# Patient Record
Sex: Female | Born: 1980 | Hispanic: No | Marital: Single | Smoking: Former smoker
Health system: Southern US, Community
[De-identification: ages and names within clinical notes are randomized; demographics above are authoritative.]

## PROBLEM LIST (undated history)

## (undated) DIAGNOSIS — F319 Bipolar disorder, unspecified: Secondary | ICD-10-CM

## (undated) DIAGNOSIS — B009 Herpesviral infection, unspecified: Secondary | ICD-10-CM

## (undated) DIAGNOSIS — F4310 Post-traumatic stress disorder, unspecified: Secondary | ICD-10-CM

## (undated) DIAGNOSIS — K8020 Calculus of gallbladder without cholecystitis without obstruction: Secondary | ICD-10-CM

## (undated) DIAGNOSIS — E282 Polycystic ovarian syndrome: Secondary | ICD-10-CM

## (undated) DIAGNOSIS — I639 Cerebral infarction, unspecified: Secondary | ICD-10-CM

## (undated) DIAGNOSIS — F209 Schizophrenia, unspecified: Secondary | ICD-10-CM

## (undated) HISTORY — PX: NO PAST SURGERIES: SHX2092

## (undated) HISTORY — DX: Herpesviral infection, unspecified: B00.9

## (undated) HISTORY — DX: Calculus of gallbladder without cholecystitis without obstruction: K80.20

---
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
## 2014-10-11 ENCOUNTER — Encounter (HOSPITAL_COMMUNITY): Payer: Self-pay | Admitting: Neurology

## 2014-10-11 ENCOUNTER — Emergency Department (HOSPITAL_COMMUNITY): Payer: Self-pay

## 2014-10-11 ENCOUNTER — Emergency Department (HOSPITAL_COMMUNITY)
Admission: EM | Admit: 2014-10-11 | Discharge: 2014-10-11 | Disposition: A | Discharge status: Home / Self Care | Payer: Self-pay | Attending: Emergency Medicine | Admitting: Emergency Medicine

## 2014-10-11 DIAGNOSIS — R079 Chest pain, unspecified: Secondary | ICD-10-CM | POA: Insufficient documentation

## 2014-10-11 DIAGNOSIS — Z720 Tobacco use: Secondary | ICD-10-CM | POA: Insufficient documentation

## 2014-10-11 DIAGNOSIS — Z3202 Encounter for pregnancy test, result negative: Secondary | ICD-10-CM | POA: Insufficient documentation

## 2014-10-11 DIAGNOSIS — R112 Nausea with vomiting, unspecified: Secondary | ICD-10-CM | POA: Insufficient documentation

## 2014-10-11 DIAGNOSIS — R0602 Shortness of breath: Secondary | ICD-10-CM | POA: Insufficient documentation

## 2014-10-11 DIAGNOSIS — Z8659 Personal history of other mental and behavioral disorders: Secondary | ICD-10-CM | POA: Insufficient documentation

## 2014-10-11 DIAGNOSIS — R42 Dizziness and giddiness: Secondary | ICD-10-CM | POA: Insufficient documentation

## 2014-10-11 DIAGNOSIS — R55 Syncope and collapse: Secondary | ICD-10-CM | POA: Insufficient documentation

## 2014-10-11 HISTORY — DX: Schizophrenia, unspecified: F20.9

## 2014-10-11 HISTORY — DX: Bipolar disorder, unspecified: F31.9

## 2014-10-11 HISTORY — DX: Polycystic ovarian syndrome: E28.2

## 2014-10-11 LAB — I-STAT BETA HCG BLOOD, ED (MC, WL, AP ONLY): I-stat hCG, quantitative: <5 m[IU]/mL (ref <5)

## 2014-10-11 LAB — BASIC METABOLIC PANEL
Anion gap: 9 (ref 5–15)
BUN: 8 mg/dL (ref 6–20)
CO2: 26 mmol/L (ref 22–32)
Calcium: 9.1 mg/dL (ref 8.9–10.3)
Chloride: 101 mmol/L (ref 101–111)
Creatinine, Ser: 1.01 mg/dL — ABNORMAL HIGH (ref 0.44–1.00)
GFR calc Af Amer: >60 mL/min (ref >60)
GFR calc non Af Amer: >60 mL/min (ref >60)
Glucose, Bld: 103 mg/dL — ABNORMAL HIGH (ref 65–99)
Potassium: 3.7 mmol/L (ref 3.5–5.1)
Sodium: 136 mmol/L (ref 135–145)

## 2014-10-11 LAB — D-DIMER, QUANTITATIVE: D-Dimer, Quant: 0.3 ug/mL-FEU (ref 0.00–0.48)

## 2014-10-11 LAB — I-STAT TROPONIN, ED: Troponin i, poc: 0 ng/mL (ref 0.00–0.08)

## 2014-10-11 LAB — CBC
HCT: 43 % (ref 36.0–46.0)
Hemoglobin: 13.9 g/dL (ref 12.0–15.0)
MCH: 28.8 pg (ref 26.0–34.0)
MCHC: 32.3 g/dL (ref 30.0–36.0)
MCV: 89 fL (ref 78.0–100.0)
Platelets: 279 10*3/uL (ref 150–400)
RBC: 4.83 MIL/uL (ref 3.87–5.11)
RDW: 13.2 % (ref 11.5–15.5)
WBC: 5.3 10*3/uL (ref 4.0–10.5)

## 2014-10-11 NOTE — ED Notes (Signed)
Patient transported to X-ray 

## 2014-10-11 NOTE — Discharge Instructions (Signed)
Primary Amenorrhea Primary amenorrhea is the absence of any menstrual flow in a female by the age of 15 years. An average age for the start of menstruation is the age of 12 years. Primary amenorrhea is not considered to have occurred until a female is older than 15 years and has never menstruated. This may occur with or without other signs of puberty. CAUSES  Some common causes of not menstruating include:  Chromosomal abnormality causing the ovaries to malfunction is the most common cause of primary amenorrhea.  Malnutrition.  Low blood sugar (hypoglycemia).  Polycystic ovary syndrome (cysts in the ovaries, not ovulating).  Absence of the vagina, uterus, or ovaries since birth (congenital).  Extreme obesity.  Cystic fibrosis.  Drastic weight loss from any cause.  Over-exercising (running, biking) causing loss of body fat.  Pituitary gland tumor in the brain.  Long-term (chronic) illnesses.  Cushing disease.  Thyroid disease (hypothyroidism, hyperthyroidism).  Part of the brain (hypothalamus) not functioning normally.  Premature ovarian failure. SYMPTOMS  No menstruation by age 89 years in normally developed females is the primary symptom. Other symptoms may include:  Discharge from the breasts.  Hot flashes.  Adult acne.  Facial or chest hair.  Headaches.  Impaired vision.  Recent stress.  Changes in weight, diet, or exercise patterns. DIAGNOSIS  Primary amenorrhea is diagnosed with the help of a medical history and a physical exam. Other tests that may be recommended include:  Blood tests to check for pregnancy, hormonal changes, a bleeding or thyroid disorder, low iron levels (anemia), or other problems.  Urine tests.  Specialized X-ray exams. TREATMENT  Treatment will depend on the cause. For example, some of the causes of primary amenorrhea, such as congenital absence of sex organs, will require surgery to correct. Others may respond to treatment  with medicine. SEEK MEDICAL CARE IF:  There has not been any menstrual flow by age 84 years.  Body maturation does not occur at a level typical of peers.  Pelvic area pain occurs.  There is unusual weight gain or hair growth.   This information is not intended to replace advice given to you by your health care provider. Make sure you discuss any questions you have with your health care provider.   Document Released: 12/23/2004 Document Revised: 01/13/2014 Document Reviewed: 08/04/2012 Elsevier Interactive Patient Education 2016 [employer] Nonspecific Chest Pain  Chest pain can be caused by many different conditions. There is always a chance that your pain could be related to something serious, such as a heart attack or a blood clot in your lungs. Chest pain can also be caused by conditions that are not life-threatening. If you have chest pain, it is very important to follow up with your health care provider. CAUSES  Chest pain can be caused by:  Heartburn.  Pneumonia or bronchitis.  Anxiety or stress.  Inflammation around your heart (pericarditis) or lung (pleuritis or pleurisy).  A blood clot in your lung.  A collapsed lung (pneumothorax). It can develop suddenly on its own (spontaneous pneumothorax) or from trauma to the chest.  Shingles infection (varicella-zoster virus).  Heart attack.  Damage to the bones, muscles, and cartilage that make up your chest wall. This can include:  Bruised bones due to injury.  Strained muscles or cartilage due to frequent or repeated coughing or overwork.  Fracture to one or more ribs.  Sore cartilage due to inflammation (costochondritis). RISK FACTORS  Risk factors for chest pain may include:  Activities that increase your  risk for trauma or injury to your chest.  Respiratory infections or conditions that cause frequent coughing.  Medical conditions or overeating that can cause heartburn.  Heart disease or family history of  heart disease.  Conditions or health behaviors that increase your risk of developing a blood clot.  Having had chicken pox (varicella zoster). SIGNS AND SYMPTOMS Chest pain can feel like:  Burning or tingling on the surface of your chest or deep in your chest.  Crushing, pressure, aching, or squeezing pain.  Dull or sharp pain that is worse when you move, cough, or take a deep breath.  Pain that is also felt in your back, neck, shoulder, or arm, or pain that spreads to any of these areas. Your chest pain may come and go, or it may stay constant. DIAGNOSIS Lab tests or other studies may be needed to find the cause of your pain. Your health care provider may have you take a test called an ambulatory ECG (electrocardiogram). An ECG records your heartbeat patterns at the time the test is performed. You may also have other tests, such as:  Transthoracic echocardiogram (TTE). During echocardiography, sound waves are used to create a picture of all of the heart structures and to look at how blood flows through your heart.  Transesophageal echocardiogram (TEE).This is a more advanced imaging test that obtains images from inside your body. It allows your health care provider to see your heart in finer detail.  Cardiac monitoring. This allows your health care provider to monitor your heart rate and rhythm in real time.  Holter monitor. This is a portable device that records your heartbeat and can help to diagnose abnormal heartbeats. It allows your health care provider to track your heart activity for several days, if needed.  Stress tests. These can be done through exercise or by taking medicine that makes your heart beat more quickly.  Blood tests.  Imaging tests. TREATMENT  Your treatment depends on what is causing your chest pain. Treatment may include:  Medicines. These may include:  Acid blockers for heartburn.  Anti-inflammatory medicine.  Pain medicine for inflammatory  conditions.  Antibiotic medicine, if an infection is present.  Medicines to dissolve blood clots.  Medicines to treat coronary artery disease.  Supportive care for conditions that do not require medicines. This may include:  Resting.  Applying heat or cold packs to injured areas.  Limiting activities until pain decreases. HOME CARE INSTRUCTIONS  If you were prescribed an antibiotic medicine, finish it all even if you start to feel better.  Avoid any activities that bring on chest pain.  Do not use any tobacco products, including cigarettes, chewing tobacco, or electronic cigarettes. If you need help quitting, ask your health care provider.  Do not drink alcohol.  Take medicines only as directed by your health care provider.  Keep all follow-up visits as directed by your health care provider. This is important. This includes any further testing if your chest pain does not go away.  If heartburn is the cause for your chest pain, you may be told to keep your head raised (elevated) while sleeping. This reduces the chance that acid will go from your stomach into your esophagus.  Make lifestyle changes as directed by your health care provider. These may include:  Getting regular exercise. Ask your health care provider to suggest some activities that are safe for you.  Eating a heart-healthy diet. A registered dietitian can help you to learn healthy eating options.  Maintaining  a healthy weight.  Managing diabetes, if necessary.  Reducing stress. SEEK MEDICAL CARE IF:  Your chest pain does not go away after treatment.  You have a rash with blisters on your chest.  You have a fever. SEEK IMMEDIATE MEDICAL CARE IF:   Your chest pain is worse.  You have an increasing cough, or you cough up blood.  You have severe abdominal pain.  You have severe weakness.  You faint.  You have chills.  You have sudden, unexplained chest discomfort.  You have sudden, unexplained  discomfort in your arms, back, neck, or jaw.  You have shortness of breath at any time.  You suddenly start to sweat, or your skin gets clammy.  You feel nauseous or you vomit.  You suddenly feel light-headed or dizzy.  Your heart begins to beat quickly, or it feels like it is skipping beats. These symptoms may represent a serious problem that is an emergency. Do not wait to see if the symptoms will go away. Get medical help right away. Call your local emergency services (911 in the [place] Do not drive yourself to the hospital.   This information is not intended to replace advice given to you by your health care provider. Make sure you discuss any questions you have with your health care provider.   Document Released: 10/02/2004 Document Revised: 01/13/2014 Document Reviewed: 07/29/2013 Elsevier Interactive Patient Education [employer]
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
## 2014-10-11 NOTE — ED Notes (Addendum)
Pt reports for several months has been having cp and syncope, also irregular periods. Today she came because her skin color is getting lighter than normal. Reports feeling tired and weak all the time. Denies cp at current, but has h/a to back of head. Pt reports polycystic ovarian disease and has been having LLQ abd pain. Also has gained 30 lbs in 2 months

## 2015-04-27 ENCOUNTER — Encounter (HOSPITAL_COMMUNITY): Payer: Self-pay | Admitting: *Deleted

## 2015-04-27 ENCOUNTER — Inpatient Hospital Stay (HOSPITAL_COMMUNITY)
Admission: AD | Admit: 2015-04-27 | Discharge: 2015-05-02 | DRG: 885 | Disposition: A | Discharge status: Home / Self Care | Payer: No Typology Code available for payment source | Source: Intra-hospital | Attending: Psychiatry | Admitting: Psychiatry

## 2015-04-27 ENCOUNTER — Encounter (HOSPITAL_COMMUNITY): Payer: Self-pay

## 2015-04-27 ENCOUNTER — Emergency Department (HOSPITAL_COMMUNITY)
Admission: EM | Admit: 2015-04-27 | Discharge: 2015-04-27 | Disposition: A | Discharge status: Other Acute Inpatient Hospital | Payer: Self-pay | Attending: Emergency Medicine | Admitting: Emergency Medicine

## 2015-04-27 DIAGNOSIS — Z9119 Patient's noncompliance with other medical treatment and regimen: Secondary | ICD-10-CM | POA: Diagnosis not present

## 2015-04-27 DIAGNOSIS — R440 Auditory hallucinations: Secondary | ICD-10-CM

## 2015-04-27 DIAGNOSIS — F17210 Nicotine dependence, cigarettes, uncomplicated: Secondary | ICD-10-CM | POA: Insufficient documentation

## 2015-04-27 DIAGNOSIS — Z62810 Personal history of physical and sexual abuse in childhood: Secondary | ICD-10-CM | POA: Diagnosis present

## 2015-04-27 DIAGNOSIS — F250 Schizoaffective disorder, bipolar type: Secondary | ICD-10-CM | POA: Diagnosis present

## 2015-04-27 DIAGNOSIS — F1210 Cannabis abuse, uncomplicated: Secondary | ICD-10-CM | POA: Insufficient documentation

## 2015-04-27 DIAGNOSIS — G4700 Insomnia, unspecified: Secondary | ICD-10-CM | POA: Diagnosis present

## 2015-04-27 DIAGNOSIS — Z3202 Encounter for pregnancy test, result negative: Secondary | ICD-10-CM | POA: Insufficient documentation

## 2015-04-27 DIAGNOSIS — Z818 Family history of other mental and behavioral disorders: Secondary | ICD-10-CM | POA: Diagnosis not present

## 2015-04-27 DIAGNOSIS — F4310 Post-traumatic stress disorder, unspecified: Secondary | ICD-10-CM | POA: Diagnosis present

## 2015-04-27 DIAGNOSIS — Z915 Personal history of self-harm: Secondary | ICD-10-CM | POA: Diagnosis not present

## 2015-04-27 DIAGNOSIS — R45851 Suicidal ideations: Secondary | ICD-10-CM | POA: Diagnosis present

## 2015-04-27 DIAGNOSIS — F419 Anxiety disorder, unspecified: Secondary | ICD-10-CM | POA: Diagnosis present

## 2015-04-27 DIAGNOSIS — Z8639 Personal history of other endocrine, nutritional and metabolic disease: Secondary | ICD-10-CM | POA: Insufficient documentation

## 2015-04-27 LAB — RAPID URINE DRUG SCREEN, HOSP PERFORMED
Amphetamines: NOT DETECTED
Barbiturates: NOT DETECTED
Benzodiazepines: NOT DETECTED
Cocaine: NOT DETECTED
Opiates: NOT DETECTED
Tetrahydrocannabinol: POSITIVE — AB

## 2015-04-27 LAB — ETHANOL: Alcohol, Ethyl (B): <5 mg/dL (ref <5)

## 2015-04-27 LAB — CBC WITH DIFFERENTIAL/PLATELET
Basophils Absolute: 0 10*3/uL (ref 0.0–0.1)
Basophils Relative: 0 %
Eosinophils Absolute: 0 10*3/uL (ref 0.0–0.7)
Eosinophils Relative: 0 %
HCT: 44.2 % (ref 36.0–46.0)
Hemoglobin: 14.7 g/dL (ref 12.0–15.0)
Lymphocytes Relative: 24 %
Lymphs Abs: 2.1 10*3/uL (ref 0.7–4.0)
MCH: 28.8 pg (ref 26.0–34.0)
MCHC: 33.3 g/dL (ref 30.0–36.0)
MCV: 86.7 fL (ref 78.0–100.0)
Monocytes Absolute: 0.7 10*3/uL (ref 0.1–1.0)
Monocytes Relative: 8 %
Neutro Abs: 5.9 10*3/uL (ref 1.7–7.7)
Neutrophils Relative %: 68 %
Platelets: 350 10*3/uL (ref 150–400)
RBC: 5.1 MIL/uL (ref 3.87–5.11)
RDW: 14.3 % (ref 11.5–15.5)
WBC: 8.7 10*3/uL (ref 4.0–10.5)

## 2015-04-27 LAB — COMPREHENSIVE METABOLIC PANEL
ALT: 35 U/L (ref 14–54)
AST: 20 U/L (ref 15–41)
Albumin: 4.4 g/dL (ref 3.5–5.0)
Alkaline Phosphatase: 84 U/L (ref 38–126)
Anion gap: 7 (ref 5–15)
BUN: 11 mg/dL (ref 6–20)
CO2: 22 mmol/L (ref 22–32)
Calcium: 9.3 mg/dL (ref 8.9–10.3)
Chloride: 108 mmol/L (ref 101–111)
Creatinine, Ser: 0.86 mg/dL (ref 0.44–1.00)
GFR calc Af Amer: >60 mL/min (ref >60)
GFR calc non Af Amer: >60 mL/min (ref >60)
Glucose, Bld: 99 mg/dL (ref 65–99)
Potassium: 3.6 mmol/L (ref 3.5–5.1)
Sodium: 137 mmol/L (ref 135–145)
Total Bilirubin: 1.1 mg/dL (ref 0.3–1.2)
Total Protein: 7.9 g/dL (ref 6.5–8.1)

## 2015-04-27 LAB — POC URINE PREG, ED: Preg Test, Ur: NEGATIVE

## 2015-04-27 MED ORDER — ARIPIPRAZOLE 5 MG PO TABS
5.0000 mg | ORAL_TABLET | Freq: Every day | ORAL | Status: DC
  Administered 2015-04-28 – 2015-04-29 (×2): 5 mg via ORAL
  Filled 2015-04-27 (×3): qty 1

## 2015-04-27 MED ORDER — ACETAMINOPHEN 325 MG PO TABS: 650.0000 mg | ORAL_TABLET | Freq: Four times a day (QID) | ORAL | Status: DC | PRN

## 2015-04-27 MED ORDER — MAGNESIUM HYDROXIDE 400 MG/5ML PO SUSP
30.0000 mL | Freq: Every day | ORAL | Status: DC | PRN
  Administered 2015-04-28: 30 mL via ORAL
  Filled 2015-04-27: qty 30

## 2015-04-27 MED ORDER — ALUM & MAG HYDROXIDE-SIMETH 200-200-20 MG/5ML PO SUSP
30.0000 mL | ORAL | Status: DC | PRN
  Administered 2015-04-28: 30 mL via ORAL
  Filled 2015-04-27: qty 30

## 2015-04-27 MED ORDER — ARIPIPRAZOLE 5 MG PO TABS
5.0000 mg | ORAL_TABLET | Freq: Every day | ORAL | Status: DC
Start: 2015-04-27 — End: 2015-04-27
  Administered 2015-04-27: 5 mg via ORAL
  Filled 2015-04-27: qty 1
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
## 2015-04-27 NOTE — ED Notes (Signed)
Patient's family has her belongings. 
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
## 2015-04-27 NOTE — ED Notes (Signed)
[facility], patient denies needs at this time Pt leaving with Juel [place]
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
## 2015-04-27 NOTE — ED Notes (Signed)
Bed: WBH42 Expected date:  Expected time:  Means of arrival:  Comments: Triage 4 

## 2015-04-27 NOTE — ED Notes (Signed)
Pt received asleep in room. Patient denies SI, HI, A/ V H, depression, anxiety and pain.  No complaints, stable, in no acute distress. Q15 minute rounds and monitoring via [employer] to continue.

## 2015-04-27 NOTE — ED Notes (Signed)
Pt admitted to room #42. Behavior calm, flat affect. Pt reports she is at the hospital d/t "feeling lethargic" Pt reports she has not been able to sleep. Pt denies SI/HI. Pt endorses auditory and visual hallucinations. Pt reports the voices "tell me things ahead of time." Pt reports visual hallucinations of seeing shadows. Pt reports a dx of bipolar and schizophrenia. Reports she was prescribed medication, but has been off her medication for 1 year d/t unable to afford them. Pt denies legal issues and denies hx of aggression. Pt reports she is from [place] and recently moved to come back home with family. Pt denies drug use and reports that she drinks socially. Special checks q 15 mins in place for safety. Video monitoring in place. Will continue to monitor.

## 2015-04-27 NOTE — ED Notes (Signed)
Pt has been changed into scrubs and wanded by security.  Pt's mother took all belongings.

## 2015-04-27 NOTE — Progress Notes (Signed)
[facility] and transfer at 9:00pm.  Call report to [phone].     Maryelizabeth Rowanressa Svea Pusch, MSW, Clare CharonLCSW, LCAS [facility] Triage Specialist [phone]

## 2015-04-27 NOTE — ED Provider Notes (Signed)
CSN: [id number]     Arrival date & time 04/27/15  [id number] History   First MD Initiated Contact with Patient 04/27/15 1009     Chief Complaint  Patient presents with  . Psychiatric Evaluation  . Weakness   Level 5 caveat psychiatric complaints  (Consider location/radiation/quality/duration/timing/severity/associated sxs/prior Treatment) HPI Patient arrived here from [place] within the past 24 hours. She complains of feeling "lethargic" for the past several days. Her mother reports that she "looks right through me like him not better" patient admits to auditory hallucinations last time 3 days ago. She hears voices talking to her like she is in the third person however no command hallucinations and she vehemently denies that she wants to harm herself or anyone else. When I asked mother if she's concerned about patient harm herself her mother reported "I'm concerned about my daughter's well-being" no other associated symptoms. Patient has not taken any Abilify in one year. Past Medical History  Diagnosis Date  . Polycystic ovary disease   . Bipolar 1 disorder (HCC)   . Schizophrenia (HCC)    History reviewed. No pertinent past surgical history. History reviewed. No pertinent family history. Social History  Substance Use Topics  . Smoking status: Current Every Day Smoker    Types: Cigarettes  . Smokeless tobacco: None  . Alcohol Use: Yes     Comment: occ   OB History    No data available     Review of Systems  Unable to perform ROS: Psychiatric disorder  Psychiatric/Behavioral: Positive for hallucinations. Negative for sleep disturbance.      Allergies  Review of patient's allergies indicates no known allergies.  Home Medications   Prior to Admission medications   Not on File   BP 119/85 mmHg  Pulse 82  Temp(Src) 98.3 F (36.8 C) (Oral)  Resp 16  SpO2 100%  LMP 04/17/2015 Physical Exam  Constitutional: She is oriented to person, place, and time. She appears  well-developed and well-nourished. No distress.  Pleasant and cooperative  HENT:  Head: Normocephalic and atraumatic.  Eyes: Conjunctivae are normal. Pupils are equal, round, and reactive to light.  Neck: Neck supple. No tracheal deviation present. No thyromegaly present.  Cardiovascular: Normal rate and regular rhythm.   No murmur heard. Pulmonary/Chest: Effort normal and breath sounds normal.  Abdominal: Soft. Bowel sounds are normal. She exhibits no distension. There is no tenderness.  Musculoskeletal: Normal range of motion. She exhibits no edema or tenderness.  Neurological: She is alert and oriented to person, place, and time. Coordination normal.  Gait normal not lightheaded on standing  Skin: Skin is warm and dry. No rash noted.  Psychiatric: She has a normal mood and affect.  Nursing note and vitals reviewed.   ED Course  Procedures (including critical care time) Labs Review Labs Reviewed - No data to display  Imaging Review No results found. I have personally reviewed and evaluated these images and lab results as part of my medical decision-making.   EKG Interpretation None     Results for orders placed or performed during the hospital encounter of 04/27/15  Comprehensive metabolic panel  Result Value Ref Range   Sodium 137 135 - 145 mmol/L   Potassium 3.6 3.5 - 5.1 mmol/L   Chloride 108 101 - 111 mmol/L   CO2 22 22 - 32 mmol/L   Glucose, Bld 99 65 - 99 mg/dL   BUN 11 6 - 20 mg/dL   Creatinine, Ser 4.010.86 0.44 - 1.00 mg/dL   Calcium  9.3 8.9 - 10.3 mg/dL   Total Protein 7.9 6.5 - 8.1 g/dL   Albumin 4.4 3.5 - 5.0 g/dL   AST 20 15 - 41 U/L   ALT 35 14 - 54 U/L   Alkaline Phosphatase 84 38 - 126 U/L   Total Bilirubin 1.1 0.3 - 1.2 mg/dL   GFR calc non Af Amer >60 >60 mL/min   GFR calc Af Amer >60 >60 mL/min   Anion gap 7 5 - 15  Ethanol  Result Value Ref Range   Alcohol, Ethyl (B) <5 <5 mg/dL  CBC with Diff  Result Value Ref Range   WBC 8.7 4.0 - 10.5 K/uL    RBC 5.10 3.87 - 5.11 MIL/uL   Hemoglobin 14.7 12.0 - 15.0 g/dL   HCT 16.1 09.6 - 04.5 %   MCV 86.7 78.0 - 100.0 fL   MCH 28.8 26.0 - 34.0 pg   MCHC 33.3 30.0 - 36.0 g/dL   RDW 40.9 81.1 - 91.4 %   Platelets 350 150 - 400 K/uL   Neutrophils Relative % 68 %   Neutro Abs 5.9 1.7 - 7.7 K/uL   Lymphocytes Relative 24 %   Lymphs Abs 2.1 0.7 - 4.0 K/uL   Monocytes Relative 8 %   Monocytes Absolute 0.7 0.1 - 1.0 K/uL   Eosinophils Relative 0 %   Eosinophils Absolute 0.0 0.0 - 0.7 K/uL   Basophils Relative 0 %   Basophils Absolute 0.0 0.0 - 0.1 K/uL  POC urine preg, ED (not at [facility])  Result Value Ref Range   Preg Test, Ur NEGATIVE NEGATIVE   No results found.  MDM  TTS consulted And will arrange for inpatient psychiatric stay Diagnosis #1 auditory hallucinations #2 bipolar disorder #3 schizophrenia #4 medication noncompliance Final diagnoses:  None        Doug Sou, MD 04/27/15 1225

## 2015-04-28 DIAGNOSIS — F250 Schizoaffective disorder, bipolar type: Principal | ICD-10-CM

## 2015-04-28 LAB — LIPID PANEL
Cholesterol: 218 mg/dL — ABNORMAL HIGH (ref 0–200)
HDL: 60 mg/dL (ref >40)
LDL Cholesterol: 140 mg/dL — ABNORMAL HIGH (ref 0–99)
Total CHOL/HDL Ratio: 3.6 RATIO
Triglycerides: 91 mg/dL (ref <150)
VLDL: 18 mg/dL (ref 0–40)

## 2015-04-28 MED ORDER — MIRTAZAPINE 7.5 MG PO TABS
7.5000 mg | ORAL_TABLET | Freq: Every day | ORAL | Status: DC
  Administered 2015-04-28: 7.5 mg via ORAL
  Filled 2015-04-28 (×3): qty 1
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
## 2015-04-28 NOTE — Plan of Care (Signed)
Problem: Alteration in mood Goal: LTG-Patient reports reduction in suicidal thoughts (Patient reports reduction in suicidal thoughts and is able to verbalize a safety plan for whenever patient is feeling suicidal)  Pt. Denies SI this AM. States she will come to staff if this changes.

## 2015-04-28 NOTE — Progress Notes (Signed)
Adult Psychoeducational Group Note  Date:  04/28/2015 Time:  8:53 PM  Group Topic/Focus:  Wrap-Up Group:   The focus of this group is to help patients review their daily goal of treatment and discuss progress on daily workbooks.  Participation Level:  Active  Participation Quality:  Appropriate  Affect:  Appropriate  Cognitive:  Appropriate  Insight: Appropriate  Engagement in Group:  Engaged  Modes of Intervention:  Discussion  Additional Comments: The patient expressed that she had a good day and rates today a 9.The patient also attend groups.  Octavio Mannshigpen, Halaina Vanduzer Lee 04/28/2015, 8:53 PM
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
## 2015-04-28 NOTE — [facility] Group Notes (Signed)
[facility] Group Notes: (Clinical Social Work)   04/28/2015      Type of Therapy:  Group Therapy   Participation Level:  Did Not Attend despite MHT prompting   Abigale Dorow Grossman-Orr, LCSW 04/28/2015, 12:59 PM     

## 2015-04-28 NOTE — Progress Notes (Signed)
D) Pt. Reports sleeping poorly last night. She also reports pain of 2/10 in her mid abdomen. She also states she haas not had a bM for 2 days. Denies SI/HI/AVH.  Took AM medications without difficulty. A) . MOM given and pt. Encouraged to push fluids. Gatorade at bedside.  R) Pain at reassessment is 0/10. Still 0 BM, will be reassessed at close of shift. Continue to maintain safety on the unit.
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
## 2015-04-29 MED ORDER — QUETIAPINE FUMARATE 100 MG PO TABS
100.0000 mg | ORAL_TABLET | Freq: Every day | ORAL | Status: DC
  Administered 2015-04-29 – 2015-04-30 (×2): 100 mg via ORAL
  Filled 2015-04-29 (×4): qty 1

## 2015-04-29 MED ORDER — HYDROXYZINE HCL 25 MG PO TABS
25.0000 mg | ORAL_TABLET | Freq: Four times a day (QID) | ORAL | Status: DC | PRN
  Filled 2015-04-29: qty 10

## 2015-04-29 NOTE — Progress Notes (Signed)
D) Pt. Reports 8/10 for depression, 7/10 for hopelessness and she did not grade anxiety on her self inventory but verbally she reports no SI/HI or AVH. Her goal is "goals". Pt. Is disheveled and in scrubs. Marland Kitchen. A) Continue to maintain safety on the unit. R) Pt. Attending groups and interacting appropriately with staff and peers.

## 2015-04-29 NOTE — Plan of Care (Signed)
Problem: Ineffective individual coping Goal: STG: Patient will remain free from self harm Outcome: Progressing Pt. Has been taking her medications as prescribed. She has denied SI this AM and verbalizes that she will come to staff if that changes.

## 2015-04-29 NOTE — Progress Notes (Signed)
Sarah Evans rates Anxiety 2/10 and Depression 8/10. Her goal for today was to go to group and actively participate. She states she has not been sleeping well on Remeron and has been on Seroquel before and would like to restart that as opposed to continuing to take Remeron. Denies SI/HI/AVH. Encouragement and support given. Medications administered as prescribed. Continue to monitor Q15 minutes for patient safety and medication effectiveness.

## 2015-04-29 NOTE — [facility] Group Notes (Signed)
[facility] Group Notes:  (Clinical Social Work)  04/29/2015  11:00AM-12:00PM  Summary of Progress/Problems:  The main focus of today's process group was to listen to a variety of genres of music and to identify that different types of music provoke different responses.  The patient then was able to identify personally what was soothing for them, as well as energizing, as well as how patient can personally use this knowledge in sleep habits, with depression, and with other symptoms.  The patient expressed at the end of group that she felt happy and different than she did at the beginning of group.  Her affect was less constricted, noticeably so.  Type of Therapy:  Music Therapy   Participation Level:  Active  Participation Quality:  Attentive and Sharing  Affect:  Blunted  Cognitive:  Oriented  Insight:  Engaged  Engagement in Therapy:  Engaged  Modes of Intervention:   Activity, Exploration  Ambrose MantleMareida Grossman-Orr, LCSW 04/29/2015

## 2015-04-29 NOTE — Progress Notes (Signed)
[facility] MD Progress Note  04/29/2015 10:51 AM Sarah Evans  MRN:  [id number] Subjective:  I don't like Remeron.  It is making me restless.  I still have bad thoughts.  I cannot sleep very well.  I like Seroquel.  Subjective; Patient is a 35 year old African-American female who was admitted because of severe depression, having hallucination and paranoia.  She recently breakup with her partner.  Patient has history of right polar disorder and schizophrenia in the past.  She had taken Abilify in the past. Patient seen chart reviewed.  Patient remained very anxious, paranoid and guarded.  She does not want Remeron because she could not sleep with Remeron.  She preferred to go back on Seroquel.  Currently she is taking Abilify she does not feel it is helping as much.  Patient is very isolated, withdrawn and does not participate in the groups.  She still hear voices on and off and remained very depressed.  She continued to endorse thoughts of harming herself.  She has no tremors or shakes. Lab drawn.  Prolactin level and hemoglobin A1c is pending.   Principal Problem: Schizoaffective disorder, bipolar type (HCC) Diagnosis:   Patient Active Problem List   Diagnosis Date Noted  . Schizoaffective disorder, bipolar type (HCC) [F25.0] 04/27/2015   Total Time spent with patient: 30 minutes  Past Psychiatric History: See admission note.  Past Medical History:  Past Medical History  Diagnosis Date  . Polycystic ovary disease   . Bipolar 1 disorder (HCC)   . Schizophrenia (HCC)    History reviewed. No pertinent past surgical history. Family History: History reviewed. No pertinent family history. Family Psychiatric  History: See admission note Social History:  History  Alcohol Use No    Comment: occ     History  Drug Use No    Social History   Social History  . Marital Status: Single    Spouse Name: N/A  . Number of Children: N/A  . Years of Education: N/A   Social History Main Topics  .  Smoking status: Never Smoker   . Smokeless tobacco: None  . Alcohol Use: No     Comment: occ  . Drug Use: No  . Sexual Activity: Yes    Birth Control/ Protection: None   Other Topics Concern  . None   Social History Narrative   Additional Social History:                         Sleep: Fair  Appetite:  Fair  Current Medications: Current Facility-Administered Medications  Medication Dose Route Frequency Provider Last Rate Last Dose  . acetaminophen (TYLENOL) tablet 650 mg  650 mg Oral Q6H PRN Charm Rings, NP      . alum & mag hydroxide-simeth (MAALOX/MYLANTA) 200-200-20 MG/5ML suspension 30 mL  30 mL Oral Q4H PRN Charm Rings, NP   30 mL at 04/28/15 0814  . ARIPiprazole (ABILIFY) tablet 5 mg  5 mg Oral Daily Charm Rings, NP   5 mg at 04/29/15 0759  . magnesium hydroxide (MILK OF MAGNESIA) suspension 30 mL  30 mL Oral Daily PRN Charm Rings, NP   30 mL at 04/28/15 0815  . mirtazapine (REMERON) tablet 7.5 mg  7.5 mg Oral QHS Oneta Rack, NP   7.5 mg at 04/28/15 2149    Lab Results:  Results for orders placed or performed during the hospital encounter of 04/27/15 (from the past 48 hour(s))  Lipid panel     Status: Abnormal   Collection Time: 04/28/15  6:18 PM  Result Value Ref Range   Cholesterol 218 (H) 0 - 200 mg/dL   Triglycerides 91 <829 mg/dL   HDL 60 >56 mg/dL   Total CHOL/HDL Ratio 3.6 RATIO   VLDL 18 0 - 40 mg/dL   LDL Cholesterol 213 (H) 0 - 99 mg/dL    Comment:        Total Cholesterol/HDL:CHD Risk Coronary Heart Disease Risk Table                     Men   Women  1/2 Average Risk   3.4   3.3  Average Risk       5.0   4.4  2 X Average Risk   9.6   7.1  3 X Average Risk  23.4   11.0        Use the calculated Patient Ratio above and the CHD Risk Table to determine the patient's CHD Risk.        ATP III CLASSIFICATION (LDL):  <100     mg/dL   Optimal  086-578  mg/dL   Near or Above                    Optimal  130-159  mg/dL    Borderline  469-629  mg/dL   High  >528     mg/dL   Very High Performed at [facility]     Blood Alcohol level:  Lab Results  Component Value Date   [facility] <5 04/27/2015    Physical Findings: AIMS:  , ,  ,  ,    CIWA:    COWS:     Musculoskeletal: Strength & Muscle Tone: within normal limits Gait & Station: normal Patient leans: N/A  Psychiatric Specialty Exam: Review of Systems  Constitutional: Negative.   Musculoskeletal: Negative.   Skin: Negative for itching and rash.  Neurological: Negative for headaches.  Psychiatric/Behavioral: Positive for depression and hallucinations. The patient is nervous/anxious and has insomnia.     Blood pressure 114/80, pulse 91, temperature 97.3 F (36.3 C), temperature source Oral, resp. rate 20, height  (1.651 m), weight 75.297 kg (166 lb), last menstrual period 04/17/2015.Body mass index is 27.62 kg/(m^2).  General Appearance: Fairly Groomed and Guarded  Patent attorney::  Fair  Speech:  Slow  Volume:  Decreased  Mood:  Anxious, Dysphoric and Irritable  Affect:  Constricted, Depressed and Flat  Thought Process:  Circumstantial  Orientation:  Full (Time, Place, and Person)  Thought Content:  Hallucinations: Auditory, Paranoid Ideation and Rumination  Suicidal Thoughts:  Yes.  without intent/plan  Homicidal Thoughts:  No  Memory:  Immediate;   Fair Recent;   Fair Remote;   Fair  Judgement:  Fair  Insight:  Fair  Psychomotor Activity:  Decreased  Concentration:  Fair  Recall:  [employer] of Knowledge:Good  Language: Fair  Akathisia:  No  Handed:  Right  AIMS (if indicated):     Assets:  Communication Skills Desire for Improvement Housing  ADL's:  Intact  Cognition: WNL  Sleep:  Number of Hours: 6.5   Treatment Plan Summary: Daily contact with patient to assess and evaluate symptoms and progress in treatment and Medication management Discontinue Remeron as patient does not see any improvement and feels more  restless. Discontinue Abilify and we will try Seroquel.  Patient told Seroquel helped her in the past.  Labs reviewed, hemoglobin A1c and productive level pending.  Total cholesterol 218, UDS is positive for cannabis, her CBC and comprehensive metabolic panel is normal. Encouraged to participate in group milieu therapy.  Discussed medication side effects and benefits. Increase collateral information. Start discharge planning and social worker to follow-up on discharge and disposition  Lorelai Huyser T., MD 04/29/2015, 10:51 AM

## 2015-04-29 NOTE — Progress Notes (Signed)
Adult Psychoeducational Group Note  Date:  04/29/2015 Time:  8:57 PM  Group Topic/Focus:  Wrap-Up Group:   The focus of this group is to help patients review their daily goal of treatment and discuss progress on daily workbooks.  Participation Level:  Did Not Attend  Participation Quality:  Did not attend  Affect:  Did not attend  Cognitive:  Did not attend  Insight: None  Engagement in Group:  Did not attend  Modes of Intervention:  Did not attend   Additional Comments:  Patient did not attend wrap up group tonight.   [name] L [name] 04/29/2015, 8:57 PM

## 2015-04-30 DIAGNOSIS — F4310 Post-traumatic stress disorder, unspecified: Secondary | ICD-10-CM

## 2015-04-30 LAB — HEMOGLOBIN A1C
Hgb A1c MFr Bld: 5.6 % (ref 4.8–5.6)
Mean Plasma Glucose: 114 mg/dL

## 2015-04-30 LAB — PROLACTIN: Prolactin: 13 ng/mL (ref 4.8–23.3)

## 2015-04-30 MED ORDER — FLUOXETINE HCL 20 MG PO CAPS
20.0000 mg | ORAL_CAPSULE | Freq: Every day | ORAL | Status: DC
Start: 2015-04-30 — End: 2015-05-02
  Administered 2015-04-30 – 2015-05-02 (×3): 20 mg via ORAL
  Filled 2015-04-30: qty 1
  Filled 2015-04-30: qty 7
  Filled 2015-04-30 (×5): qty 1

## 2015-04-30 NOTE — Plan of Care (Signed)
Problem: Diagnosis: Increased Risk For Suicide Attempt Goal: STG-Patient Will Comply With Medication Regime Outcome: Progressing Pt has been compliant with scheduled medication tonight.       

## 2015-04-30 NOTE — Progress Notes (Signed)
D: Pt denies SI/HI/AVH. Pt is pleasant and cooperative. Pt stated she was feeling more depressed today, but feeling better overall, not feeling as bad as she was when she came in.  A: Pt was offered support and encouragement. Pt was given scheduled medications. Pt was encourage to attend groups. Q 15 minute checks were done for safety.   R:Pt attends groups and interacts well with peers and staff. Pt is taking medication. Pt has no complaints.Pt receptive to treatment and safety maintained on unit.

## 2015-04-30 NOTE — Progress Notes (Signed)
Adult Psychoeducational Group Note  Date:  04/30/2015 Time:  8:54 PM  Group Topic/Focus:  Wrap-Up Group:   The focus of this group is to help patients review their daily goal of treatment and discuss progress on daily workbooks.  Participation Level:  Active  Participation Quality:  Appropriate  Affect:  Appropriate  Cognitive:  Alert  Insight: Appropriate  Engagement in Group:  Engaged  Modes of Intervention:  Discussion  Additional Comments:  Patient states "On a scale from 1-10, my day was a 6. Patient goal for today was "to talk to my mother".   Shawne Eskelson L Lakaisha Danish 04/30/2015, 8:54 PM

## 2015-04-30 NOTE — Plan of Care (Signed)
Problem: Alteration in mood Goal: LTG-Patient reports reduction in suicidal thoughts (Patient reports reduction in suicidal thoughts and is able to verbalize a safety plan for whenever patient is feeling suicidal)  Outcome: Progressing Pt denies SI at this time     

## 2015-04-30 NOTE — [facility] Group Notes (Signed)
[facility] LCSW Group Therapy  04/30/2015 , 3:34 PM   Type of Therapy:  Group Therapy  Participation Level:  Active  Participation Quality:  Attentive  Affect:  Appropriate  Cognitive:  Alert  Insight:  Improving  Engagement in Therapy:  Engaged  Modes of Intervention:  Discussion, Exploration and Socialization  Summary of Progress/Problems: Today's group focused on the term Diagnosis.  Participants were asked to define the term, and then pronounce whether it is a negative, positive or neutral term.  Stayed the entire time.  Engaged throughout.  Was willing to reveal quite a a bit of information about herself-surprising due to her guarded nature this AM.  Stated she had recently left a 15 year relationship. "After things got rocky, I decided to give it 6 months where I would really try to work on it, so when we broke up, I felt like I had done everything i could to make it work.  No regrets."  Went on to cite support of family as what is helping her now.  "I didn't realize I had so much support.  It's been very nice."  Daryel Geraldorth, Moriah Loughry B 04/30/2015 , 3:34 PM
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
## 2015-04-30 NOTE — Progress Notes (Signed)
D: Pt was in the hallway upon initial approach.  Pt has anxious affect and mood.  She reports her goal was "to try to figure out to try to sign the 72 hour form."  Pt reports she signed a 72 hour AMA discharge request.  She reports she feels ready and safe to discharge.  Pt denies SI/HI, denies hallucinations, denies pain.  Pt has been visible in milieu with minimal peer interaction.  She did not attend evening group.  A: Introduced self to pt.  Met with pt and offered support and encouragement.  Actively listened to pt.  Medication administered per order.   R: Pt is compliant with medication.  Pt verbally contracts for safety.  Will continue to monitor and assess.

## 2015-04-30 NOTE — [facility] Group Notes (Signed)
[facility]   04/30/2015 11:37 AM  Participation Quality:  Minimal  Mood/Affect:  Flat  Depression Rating:    Anxiety Rating:    Thoughts of Suicide:  No Will you contract for safety?   NA  Current AVH:  denies  Plan for Discharge/Comments:  Presents as guarded, vague.  "I'm here because I am moody.  It was my mother's idea for me to come in.  I guess you could ask her."  States she just moved from [place] and has lots of family support here.  Transportation Means:   Supports:  Daryel GeraldNorth, Marline Morace B

## 2015-04-30 NOTE — Progress Notes (Signed)
DAR NOTE: Patient remained isolative to her room.  Mood and affect remained depressed.  Denies pain, auditory and visual hallucinations.  Rates depression at 5, hopelessness at 0, and anxiety at 0.  Maintained on routine safety checks.  Medications given as prescribed.  Support and encouragement offered as needed.  Attended group and participated.   Complain about roommate being intrusive and controlling.  Refused to go for supper after several encouragement.
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
## 2015-05-01 LAB — TSH: TSH: 1.866 u[IU]/mL (ref 0.350–4.500)

## 2015-05-01 MED ORDER — QUETIAPINE FUMARATE 50 MG PO TABS
150.0000 mg | ORAL_TABLET | Freq: Every day | ORAL | Status: DC
Start: 2015-05-01 — End: 2015-05-02
  Administered 2015-05-01: 150 mg via ORAL
  Filled 2015-05-01: qty 3
  Filled 2015-05-01: qty 21
  Filled 2015-05-01: qty 3

## 2015-05-01 NOTE — [facility] Group Notes (Signed)
[facility] Group Notes:  (Nursing/Recovery)  Date:  05/01/2015  Time: 1000 Type of Therapy:  Nurse Education  Participation Level:  Active  Participation Quality:  Appropriate, Attentive, Sharing and Supportive  Affect:  Flat  Cognitive:  Alert, Appropriate and Oriented  Insight:  Good  Engagement in Group:  Engaged and Supportive  Modes of Intervention:  Discussion, Education, Orientation and Support  Summary of Progress/Problems:  Sarah Evans, Sarah Evans 05/01/2015, 1000

## 2015-05-01 NOTE — Progress Notes (Signed)
DAR NOTE: Patient presents with flat affect and depressed mood.  Denies pain, auditory and visual hallucinations.  Rates depression at 5, hopelessness at 0, and anxiety at 0.  Maintained on routine safety checks.  Medications given as prescribed.  Support and encouragement offered as needed.  Attended group and participated.   Patient remained isolative to her room.  Patient preoccupied with discharge plan.

## 2015-05-01 NOTE — Progress Notes (Signed)
Adult Psychoeducational Group Note  Date:  05/01/2015 Time:  9:02 PM  Group Topic/Focus:  Wrap-Up Group:   The focus of this group is to help patients review their daily goal of treatment and discuss progress on daily workbooks.  Participation Level:  Active  Participation Quality:  Appropriate  Affect:  Appropriate  Cognitive:  Alert  Insight: Appropriate  Engagement in Group:  Engaged  Modes of Intervention:  Discussion  Additional Comments:  Patient goal for today was to Participate. Patient states "I met my goal". On a scale between 1-10, (1=worse, 10=best) patient rated her day a 9.5 because "It's been a great day".  Jamileth Putzier L Brigitte Soderberg 05/01/2015, 9:02 PM
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
## 2015-05-01 NOTE — [facility] Group Notes (Signed)
[facility] LCSW Group Therapy  05/01/2015 1:15 pm  Type of Therapy: Process Group Therapy  Participation Level:  Active  Participation Quality:  Appropriate  Affect:  Flat  Cognitive:  Oriented  Insight:  Improving  Engagement in Group:  Limited  Engagement in Therapy:  Limited  Modes of Intervention:  Activity, Clarification, Education, Problem-solving and Support  Summary of Progress/Problems: Today's group addressed the issue of overcoming obstacles.  Patients were asked to identify their biggest obstacle post d/c that stands in the way of their on-going success, and then problem solve as to how to manage this. Stayed the entire time.  Limited engagement, and back to being more vague and guarded.  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  Daryel Geraldorth, Sarah Evans B 05/01/2015   1:45 PM
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
## 2015-05-02 MED ORDER — HYDROXYZINE HCL 25 MG PO TABS: 25.0000 mg | ORAL_TABLET | Freq: Four times a day (QID) | ORAL | Status: DC | PRN

## 2015-05-02 MED ORDER — QUETIAPINE FUMARATE 50 MG PO TABS: 150.0000 mg | ORAL_TABLET | Freq: Every day | ORAL | Status: DC

## 2015-05-02 MED ORDER — FLUOXETINE HCL 20 MG PO CAPS: 20.0000 mg | ORAL_CAPSULE | Freq: Every day | ORAL | Status: DC
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
## 2015-05-02 NOTE — Progress Notes (Signed)
  [facility] Adult [facility] Management Discharge Plan :  Will you be returning to the same living situation after discharge:  Yes,  home At discharge, do you have transportation home?: Yes,  family Do you have the ability to pay for your medications: Yes,  mental health  Release of information consent forms completed and in the chart;  Patient's signature needed at discharge.  Patient to Follow up at: Follow-up Information    [facility] with [facility]   Specialty:  Behavioral Health   Why:  You will need to go to the walk-in clinic tomorrow or Friday at 8AM.  Take your hospital paperwork with you.   Contact information:   [address] [place] [address] [phone]       Next level of care provider has access to [facility] Link:no  Safety Planning and Suicide Prevention discussed: Yes,  yes  Have you used any form of tobacco in the last 30 days? (Cigarettes, Smokeless Tobacco, Cigars, and/or Pipes): No  Has patient been referred to the Quitline?: N/A patient is not a smoker  Patient has been referred for addiction treatment: Yes  Daryel Geraldorth, Sarah Evans B 05/02/2015, 10:55 AM

## 2015-05-02 NOTE — Progress Notes (Signed)
Recreation Therapy Notes  04.26.2017 approximately 8:30am. LRT returned with literature on stress management techniques patient can use post d/c, patient provided information on mindfulness, mindful breathing, diaphragmatic breathing and progressive muscle relaxation. LRT focused on mindful breathing and diaphragmatic breathing with patient. Patient receptive to both techniques and demonstrated she could practice independently. Patient acknowledged understanding of additional techniques and was receptive to literature provided. Patient expressed she is ready to d/c.    Marykay Lexenise L Antinette Keough, LRT/CTRS    Venancio Chenier L 05/02/2015 12:00 PM

## 2015-05-02 NOTE — Progress Notes (Signed)
Patient discharged home with prescriptions and samples. Patient was stable and appreciative at that time. All papers and prescriptions were given and valuables returned. Verbal understanding expressed. Denies SI/HI and A/VH. Patient given opportunity to express concerns and ask questions.  

## 2015-05-02 NOTE — B[facility] Suicide Risk Assessment (Signed)
[facility]   Principal Problem: Schizoaffective disorder, bipolar type [facility](HCC) Discharge Diagnoses:  Patient Active Problem List   Diagnosis Date Noted  . PTSD (post-traumatic stress disorder) [F43.10] 04/30/2015  . Schizoaffective disorder, bipolar type (HCC) [F25.0] 04/27/2015    Total Time spent with patient: 30 minutes  Musculoskeletal: Strength & Muscle Tone: within normal limits Gait & Station: normal Patient leans: N/A  Psychiatric Specialty Exam: Review of Systems  Psychiatric/Behavioral: Negative for depression and hallucinations.  All other systems reviewed and are negative.   Blood pressure 141/90, pulse 112, temperature 97.7 F (36.5 C), temperature source Oral, resp. rate 20, height 5\' 5"  (1.651 m), weight 75.297 kg (166 lb), last menstrual period 04/17/2015.Body mass index is 27.62 kg/(m^2).  General Appearance: Casual  Eye Contact::  Fair  Speech:  Clear and Coherent409  Volume:  Normal  Mood:  Euthymic  Affect:  Congruent  Thought Process:  Coherent  Orientation:  Full (Time, Place, and Person)  Thought Content:  WDL  Suicidal Thoughts:  No  Homicidal Thoughts:  No  Memory:  Immediate;   Fair Recent;   Fair Remote;   Fair  Judgement:  Fair  Insight:  Fair  Psychomotor Activity:  Normal  Concentration:  Fair  Recall:  FiservFair  Fund of Knowledge:Fair  Language: Fair  Akathisia:  No  Handed:  Right  AIMS (if indicated):     Assets:  Desire for Improvement  Sleep:  Number of Hours: 6  Cognition: WNL  ADL's:  Intact   Mental Status Per Nursing Assessment::   On Admission:  NA  Demographic Factors:  Unemployed  Loss Factors: Loss of significant relationship  Historical Factors: Impulsivity  Risk Reduction Factors:   Positive social support  Continued Clinical Symptoms:  Previous Psychiatric Diagnoses and Treatments  Cognitive Features That Contribute To Risk:  None    Suicide Risk:  Minimal: No identifiable  suicidal ideation.  Patients presenting with no risk factors but with morbid ruminations; may be classified as minimal risk based on the severity of the depressive symptoms    Plan Of Care/Follow-up recommendations:  Activity:  no restrictions Diet:  regular Tests:  as needed Other:  follow up with aftercare  Mozella Rexrode, MD 05/02/2015, 9:36 AM

## 2015-05-02 NOTE — Progress Notes (Signed)
D: D: Pt denied depression, anxiety, pain, SI, HI or AVH; she states, "I will be going home tomorrow, I feel I'm ready." Pt is however flat and withdrawn to self. Pt remained calm and cooperative. A: Medications offered as prescribed.  Support, encouragement, and safe environment provided.  15-minute safety checks continue. R: Pt was med compliant.  Pt attended wrap-up group. Safety checks continue

## 2015-05-02 NOTE — BHH Suicide Risk Assessment (Signed)
BHH INPATIENT:  Family/Significant Other Suicide Prevention Education  Suicide Prevention Education:  Education Completed; Lauris PoagLouisa Herbster, mother, [phone] has been identified by the patient as the family member/significant other with whom the patient will be residing, and identified as the person(s) who will aid the patient in the event of a mental health crisis (suicidal ideations/suicide attempt).  With written consent from the patient, the family member/significant other has been provided the following suicide prevention education, prior to the and/or following the discharge of the patient.  The suicide prevention education provided includes the following:  Suicide risk factors  Suicide prevention and interventions  National Suicide Hotline telephone number  [facility] assessment telephone number  [facility] Emergency Assistance 911  [facility] and/or Residential Mobile Crisis Unit telephone number  Request made of family/significant other to:  Remove weapons (e.g., guns, rifles, knives), all items previously/currently identified as safety concern.    Remove drugs/medications (over-the-counter, prescriptions, illicit drugs), all items previously/currently identified as a safety concern.  The family member/significant other verbalizes understanding of the suicide prevention education information provided.  The family member/significant other agrees to remove the items of safety concern listed above.  Daryel Geraldorth, Azion Centrella B 05/02/2015, 10:52 AM

## 2015-10-30 ENCOUNTER — Telehealth: Payer: Self-pay | Admitting: *Deleted

## 2015-10-30 NOTE — Telephone Encounter (Signed)
entered in error   

## 2018-01-14 ENCOUNTER — Ambulatory Visit: Payer: Self-pay | Admitting: Family Medicine

## 2018-02-23 ENCOUNTER — Ambulatory Visit: Payer: Self-pay | Admitting: Family Medicine

## 2018-03-23 ENCOUNTER — Ambulatory Visit (INDEPENDENT_AMBULATORY_CARE_PROVIDER_SITE_OTHER): Payer: Self-pay | Admitting: Primary Care

## 2018-04-19 ENCOUNTER — Encounter: Payer: Self-pay | Admitting: Nurse Practitioner

## 2018-04-19 ENCOUNTER — Ambulatory Visit: Payer: Self-pay | Admitting: Nurse Practitioner

## 2018-04-26 ENCOUNTER — Ambulatory Visit: Payer: Self-pay

## 2018-04-28 DIAGNOSIS — F3132 Bipolar disorder, current episode depressed, moderate: Secondary | ICD-10-CM | POA: Insufficient documentation

## 2018-04-28 DIAGNOSIS — Z9152 Personal history of nonsuicidal self-harm: Secondary | ICD-10-CM | POA: Insufficient documentation

## 2018-05-10 ENCOUNTER — Other Ambulatory Visit (HOSPITAL_COMMUNITY)
Admission: RE | Admit: 2018-05-10 | Discharge: 2018-05-10 | Disposition: A | Discharge status: Home / Self Care | Payer: Medicaid Other | Source: Ambulatory Visit | Attending: Family Medicine | Admitting: Family Medicine

## 2018-05-10 ENCOUNTER — Other Ambulatory Visit: Payer: Self-pay

## 2018-05-10 ENCOUNTER — Ambulatory Visit (INDEPENDENT_AMBULATORY_CARE_PROVIDER_SITE_OTHER): Payer: Self-pay | Admitting: *Deleted

## 2018-05-10 VITALS — BP 133/85 | HR 85 | Ht 66.5 in | Wt 184.8 lb

## 2018-05-10 DIAGNOSIS — N898 Other specified noninflammatory disorders of vagina: Secondary | ICD-10-CM

## 2018-05-10 NOTE — Progress Notes (Signed)
Pt presents for self swab.  Pt states that she has noticed a vaginal odor since the 2nd week of April.  Pt denies abnormal discharge, itching, irritation.  Pt denies contact with anyone with a known STD.  Advised pt that it takes 1-2 days for results to come back and medication would be prescribed at that time if indicated.  Pt's allergies, medications, and pharmacy verified.  Pt verbalized understanding.

## 2018-05-11 ENCOUNTER — Other Ambulatory Visit: Payer: Self-pay

## 2018-05-11 LAB — CERVICOVAGINAL ANCILLARY ONLY
Bacterial vaginitis: POSITIVE — AB
Candida vaginitis: NEGATIVE
Chlamydia: NEGATIVE
Neisseria Gonorrhea: NEGATIVE
Trichomonas: NEGATIVE

## 2018-05-11 MED ORDER — METRONIDAZOLE 500 MG PO TABS
500.0000 mg | ORAL_TABLET | Freq: Two times a day (BID) | ORAL | 0 refills | Status: DC
Start: 2018-05-11 — End: 2018-05-12

## 2018-05-11 NOTE — Progress Notes (Signed)
Chart reviewed for nurse visit. Agree with plan of care.   Delores Thelen M, CNM    

## 2018-05-12 ENCOUNTER — Telehealth: Payer: Self-pay | Admitting: Family Medicine

## 2018-05-12 ENCOUNTER — Other Ambulatory Visit: Payer: Self-pay

## 2018-05-12 ENCOUNTER — Telehealth: Payer: Self-pay | Admitting: Lactation Services

## 2018-05-12 ENCOUNTER — Telehealth: Payer: Self-pay

## 2018-05-12 DIAGNOSIS — B9689 Other specified bacterial agents as the cause of diseases classified elsewhere: Secondary | ICD-10-CM

## 2018-05-12 DIAGNOSIS — N760 Acute vaginitis: Principal | ICD-10-CM

## 2018-05-12 MED ORDER — METRONIDAZOLE 500 MG PO TABS: 500.0000 mg | ORAL_TABLET | Freq: Two times a day (BID) | ORAL | 0 refills | Status: DC

## 2018-05-12 NOTE — Telephone Encounter (Signed)
Patient want to have her pharmacy updated. Fax Rx to [phone] ( [place])

## 2018-05-12 NOTE — Telephone Encounter (Signed)
Called pt regarding changing her pharmacy to [email or web address], also canceled Rx with [facility]. Pt verbalized understanding.

## 2018-05-12 NOTE — Telephone Encounter (Signed)
Pt called and left voice mail on Nurse line that she needs to update her Pharmacy information. Would like a return call. Routed to Clinical Pool for follow up.

## 2018-05-12 NOTE — Telephone Encounter (Signed)
Pt concern addressed by Marykay Lex, CNM in prior telephone encounter.

## 2018-05-27 ENCOUNTER — Ambulatory Visit: Payer: Self-pay | Admitting: Primary Care

## 2018-06-10 DIAGNOSIS — N898 Other specified noninflammatory disorders of vagina: Secondary | ICD-10-CM | POA: Diagnosis present

## 2018-06-21 ENCOUNTER — Ambulatory Visit: Payer: Self-pay | Admitting: Internal Medicine

## 2018-08-05 ENCOUNTER — Ambulatory Visit: Payer: Self-pay | Admitting: Internal Medicine

## 2018-08-06 ENCOUNTER — Ambulatory Visit: Payer: Medicaid Other | Admitting: Family Medicine

## 2018-08-23 ENCOUNTER — Encounter: Payer: Medicaid Other | Admitting: Family Medicine

## 2018-08-27 ENCOUNTER — Telehealth: Payer: Self-pay | Admitting: Lactation Services

## 2018-08-27 DIAGNOSIS — E282 Polycystic ovarian syndrome: Secondary | ICD-10-CM

## 2018-08-27 NOTE — Telephone Encounter (Signed)
Pt called with concerns of having a lighter period about a month ago. Pt reports she has had been having regularly since February when she started OCP's. Pt reports she "hopes she is not pregnancy since she is on BCP's.  Pt reports she has increased her Spironolactone to 200 mg qd and was wondering if the increase in the med was the cause of a different period this month.   Discussed with pt that the irregularity is most likely related to her PCOS and that her periods may not always be consistent. Pt to call back if she continues to have issues to make an appt with a provider.

## 2018-09-03 ENCOUNTER — Encounter: Payer: Self-pay | Admitting: Nurse Practitioner

## 2018-09-03 ENCOUNTER — Ambulatory Visit: Payer: Self-pay

## 2018-09-03 ENCOUNTER — Ambulatory Visit: Payer: Self-pay | Attending: Nurse Practitioner | Admitting: Nurse Practitioner

## 2018-09-03 ENCOUNTER — Other Ambulatory Visit: Payer: Self-pay

## 2018-09-03 ENCOUNTER — Telehealth: Payer: Self-pay | Admitting: Nurse Practitioner

## 2018-09-03 VITALS — Ht 66.0 in | Wt 185.0 lb

## 2018-09-03 DIAGNOSIS — F17200 Nicotine dependence, unspecified, uncomplicated: Secondary | ICD-10-CM

## 2018-09-03 DIAGNOSIS — M79605 Pain in left leg: Secondary | ICD-10-CM

## 2018-09-03 DIAGNOSIS — M79604 Pain in right leg: Secondary | ICD-10-CM

## 2018-09-03 DIAGNOSIS — Z7689 Persons encountering health services in other specified circumstances: Secondary | ICD-10-CM

## 2018-09-03 DIAGNOSIS — F250 Schizoaffective disorder, bipolar type: Secondary | ICD-10-CM

## 2018-09-03 DIAGNOSIS — Z131 Encounter for screening for diabetes mellitus: Secondary | ICD-10-CM

## 2018-09-03 DIAGNOSIS — F4310 Post-traumatic stress disorder, unspecified: Secondary | ICD-10-CM

## 2018-09-03 DIAGNOSIS — Z130 Encounter for screening for diseases of the blood and blood-forming organs and certain disorders involving the immune mechanism: Secondary | ICD-10-CM

## 2018-09-03 DIAGNOSIS — Z13220 Encounter for screening for lipoid disorders: Secondary | ICD-10-CM

## 2018-09-03 DIAGNOSIS — F17210 Nicotine dependence, cigarettes, uncomplicated: Secondary | ICD-10-CM

## 2018-09-03 NOTE — Telephone Encounter (Signed)
New Message   1) Medication(s) Requested (by name): Naproxen   2) Pharmacy of Choice: [facility] on [address]  3) Special Requests:   Approved medications will be sent to the pharmacy, we will reach out if there is an issue.  Requests made after 3pm may not be addressed until the following business day!  If a patient is unsure of the name of the medication(s) please note and ask patient to call back when they are able to provide all info, do not send to responsible party until all information is available!

## 2018-09-03 NOTE — Telephone Encounter (Signed)
Patient does not have naproxen on med list. Will defer to PCP.

## 2018-09-03 NOTE — Progress Notes (Signed)
Virtual Visit via Telephone Note Due to national recommendations of social distancing due to [place] 19, telehealth visit is felt to be most appropriate for this patient at this time.  I discussed the limitations, risks, security and privacy concerns of performing an evaluation and management service by telephone and the availability of in person appointments. I also discussed with the patient that there may be a patient responsible charge related to this service. The patient expressed understanding and agreed to proceed.    I connected with Sarah Evans on 09/03/18  at   9:30 AM EDT  EDT by telephone and verified that I am speaking with the correct person using two identifiers.   Consent I discussed the limitations, risks, security and privacy concerns of performing an evaluation and management service by telephone and the availability of in person appointments. I also discussed with the patient that there may be a patient responsible charge related to this service. The patient expressed understanding and agreed to proceed.   Location of Patient: Private Residence   Location of Provider: [place] and [employer] Office    Persons participating in Telemedicine visit: Sarah Rankins FNP-BC [place]    History of Present Illness: Telemedicine visit for: Establish Care  has a past medical history of Bipolar 1 disorder ([place]), Polycystic ovary disease, and Schizophrenia ([place]).    Breast Problem She is requesting a breast exam. Endorses nontender swelling in both breasts since being on spirinolactone (Which she takes for PCOS). . She denies any palpable lumps, masses or discharge.   Musculoskeletal Pain Chronic back pain. Controlled.  She also endorses pain in her lower legs with prolonged walking. Relieving factors:Drinking  water or with resting.    Behavioral Health She is seeing a psychiatrist for her PTSD and schizophrenai. Wants her ears checked at office  visit. Sometimes puts pieces of tissue in her ears due to her schizophrenia which causes her to have auditory hallucinations. She feels like she has something in her ear.   Past Medical History:  Diagnosis Date  . Bipolar 1 disorder ([place])   . Polycystic ovary disease   . Schizophrenia [facility])     Past Surgical History:  Procedure Laterality Date  . NO PAST SURGERIES      Family History  Problem Relation Age of Onset  . Diabetes Maternal Grandmother   . Bipolar disorder Mother   . Hypertension Neg Hx   . Heart disease Neg Hx   . Cancer Neg Hx     Social History   Socioeconomic History  . Marital status: Single    Spouse name: Not on file  . Number of children: Not on file  . Years of education: Not on file  . Highest education level: Not on file  Occupational History  . Not on file  Social Needs  . Financial resource strain: Not on file  . Food insecurity    Worry: Not on file    Inability: Not on file  . Transportation needs    Medical: Not on file    Non-medical: Not on file  Tobacco Use  . Smoking status: Current Every Day Smoker    Packs/day: 0.25    Years: 15.00    Pack years: 3.75    Types: Cigarettes  . Smokeless tobacco: Never Used  Substance and Sexual Activity  . Alcohol use: No    Comment: occ  . Drug use: No  . Sexual activity: Yes    Birth  control/protection: None  Lifestyle  . Physical activity    Days per week: Not on file    Minutes per session: Not on file  . Stress: Not on file  Relationships  . Social Herbalist on phone: Not on file    Gets together: Not on file    Attends religious service: Not on file    Active member of club or organization: Not on file    Attends meetings of clubs or organizations: Not on file    Relationship status: Not on file  Other Topics Concern  . Not on file  Social History Narrative  . Not on file     Observations/Objective: Awake, alert and oriented x 3   Review of Systems   Constitutional: Negative for fever, malaise/fatigue and weight loss.  HENT: Negative.  Negative for nosebleeds.   Eyes: Negative.  Negative for blurred vision, double vision and photophobia.  Respiratory: Negative.  Negative for cough and shortness of breath.   Cardiovascular: Positive for claudication (bilateral leg pain with activity. Relieved with rest). Negative for chest pain, palpitations and leg swelling.  Gastrointestinal: Negative.  Negative for heartburn, nausea and vomiting.  Musculoskeletal: Positive for back pain. Negative for myalgias.  Neurological: Negative.  Negative for dizziness, focal weakness, seizures and headaches.  Psychiatric/Behavioral: Positive for depression and hallucinations. Negative for memory loss, substance abuse and suicidal ideas. The patient is nervous/anxious. The patient does not have insomnia.        SEE HPI    Assessment and Plan:  Diagnoses and all orders for this visit:  Encounter to establish care -     CBC -     CMP14+EGFR -     Lipid panel -     Hemoglobin A1c  Schizoaffective disorder, bipolar type (HCC) PTSD (post-traumatic stress disorder) Continue psychotherapy with mental health counselor Meds filled through behavioral health  Bilateral leg pain Schedule ABI Encouraged to stop smoking  Tobacco dependence Sarah Evans was counseled on the dangers of tobacco use, and was advised to quit. Reviewed strategies to maximize success, including removing cigarettes and smoking materials from environment, stress management and support of family/friends as well as pharmacological alternatives including: Wellbutrin, Chantix, Nicotine patch, Nicotine gum or lozenges. Smoking cessation support: smoking cessation hotline: 1-800-QUIT-NOW.  Smoking cessation classes are also available through [facility] and [facility]. Call [phone] or visit our website at [email or web address]   A total of 3 minutes was spent on counseling for smoking  cessation and Cyndia is not ready to quit.    Follow Up Instructions Return for labs, ABI.     I discussed the assessment and treatment plan with the patient. The patient was provided an opportunity to ask questions and all were answered. The patient agreed with the plan and demonstrated an understanding of the instructions.   The patient was advised to call back or seek an in-person evaluation if the symptoms worsen or if the condition fails to improve as anticipated.  I provided 20 minutes of non-face-to-face time during this encounter including median intraservice time, reviewing previous notes, labs, imaging, medications and explaining diagnosis and management.  Gildardo Pounds, FNP-BC

## 2018-09-04 ENCOUNTER — Encounter: Payer: Self-pay | Admitting: Nurse Practitioner

## 2018-09-05 ENCOUNTER — Other Ambulatory Visit: Payer: Self-pay | Admitting: Nurse Practitioner

## 2018-09-05 MED ORDER — NAPROXEN 500 MG PO TABS: 500.0000 mg | ORAL_TABLET | Freq: Two times a day (BID) | ORAL | 0 refills | Status: DC

## 2018-09-09 ENCOUNTER — Telehealth: Payer: Self-pay | Admitting: Nurse Practitioner

## 2018-09-09 NOTE — Telephone Encounter (Signed)
I called Pt an explain her what she need to have for a financial appt

## 2018-09-09 NOTE — Telephone Encounter (Signed)
New Message   Pt states that she is confuse about the last page and would like some help. Please f/u

## 2018-09-23 ENCOUNTER — Telehealth: Payer: Self-pay | Admitting: Obstetrics and Gynecology

## 2018-09-23 NOTE — Telephone Encounter (Signed)
The patient called in for a refill on medication for bacteria infection. She stated she visits the [facility] on [address].

## 2018-09-23 NOTE — Telephone Encounter (Signed)
Patient called again and call transferred to nurse. She states she had a visit in May for wet prep and had BV and was treated by Chrys Racer for BV. States the same vaginal odor has came back , no discharge and and wants to be treated again.  I explained I cannot approve refill per our protocol if reoccurs within 6 months- must have a visit. She states it is difficult for her to come in because she doesn't drive. I informed I can ask the provider who filled her last Rx and then we will get back to her. I explained I will message the provider because she is not here- so it may by Monday or so before we get back to her since we close at 12 on Friday. She voices understanding. Jacques Navy

## 2018-09-28 ENCOUNTER — Telehealth: Payer: Self-pay | Admitting: Nurse Practitioner

## 2018-09-28 NOTE — Telephone Encounter (Signed)
Received a message from Len Blalock- refill not approved - will need to make nurse visit for self swab or provider visit for pelvic. I called Sammantha and left a message we can not refill her med she requested- she will need to make appointment for nurse visit or doctor visit. Call the office.

## 2018-09-28 NOTE — Telephone Encounter (Signed)
Patient called wanting to answer some questions about her friends notarized letter please follow up.

## 2018-09-28 NOTE — Telephone Encounter (Signed)
Pt was informed of what she need to apply for cafa

## 2018-09-30 ENCOUNTER — Telehealth: Payer: Self-pay | Admitting: Nurse Practitioner

## 2018-09-30 NOTE — Telephone Encounter (Signed)
I call the Pt  to inform her the the financial appt will be in person, an she understood

## 2018-10-01 ENCOUNTER — Telehealth: Payer: Self-pay | Admitting: Nurse Practitioner

## 2018-10-01 NOTE — Telephone Encounter (Signed)
New Message   Pt wanted to let you know that  She had to cancel her appt until after 10/20 due to transportation and not being able to drop off her paperwork on time

## 2018-10-05 ENCOUNTER — Ambulatory Visit: Payer: Medicaid Other | Admitting: Nurse Practitioner

## 2018-10-11 ENCOUNTER — Ambulatory Visit: Payer: Medicaid Other

## 2018-10-19 ENCOUNTER — Ambulatory Visit: Payer: Medicaid Other

## 2018-10-26 ENCOUNTER — Encounter: Payer: Self-pay | Admitting: Nurse Practitioner

## 2018-10-26 ENCOUNTER — Ambulatory Visit: Payer: Self-pay | Attending: Nurse Practitioner | Admitting: Nurse Practitioner

## 2018-10-26 ENCOUNTER — Other Ambulatory Visit: Payer: Self-pay

## 2018-10-26 VITALS — BP 125/85 | HR 97 | Temp 98.9°F | Ht 66.0 in | Wt 195.0 lb

## 2018-10-26 DIAGNOSIS — E282 Polycystic ovarian syndrome: Secondary | ICD-10-CM

## 2018-10-26 DIAGNOSIS — Z7689 Persons encountering health services in other specified circumstances: Secondary | ICD-10-CM

## 2018-10-26 DIAGNOSIS — N63 Unspecified lump in unspecified breast: Secondary | ICD-10-CM

## 2018-10-26 DIAGNOSIS — H9203 Otalgia, bilateral: Secondary | ICD-10-CM

## 2018-10-26 DIAGNOSIS — R252 Cramp and spasm: Secondary | ICD-10-CM

## 2018-10-26 MED ORDER — NORGESTIMATE-ETH ESTRADIOL 0.25-35 MG-MCG PO TABS: 1.0000 | ORAL_TABLET | Freq: Every day | ORAL | 0 refills | Status: DC

## 2018-10-26 MED ORDER — NORGESTIMATE-ETH ESTRADIOL 0.25-35 MG-MCG PO TABS: 1.0000 | ORAL_TABLET | Freq: Every day | ORAL | 11 refills | Status: DC

## 2018-10-26 NOTE — Progress Notes (Signed)
Assessment & Plan:  [name] was seen today for breast problem and otalgia.  Diagnoses and all orders for this visit:  Ear pain, bilateral Benign ear exam   Swollen breast Benign physical exam  Spasms of the hands and feet -     B12 and Folate Panel -     TSH  Encounter to establish care -     Lipid Panel -     Hemoglobin A1c -     CMP14+EGFR -     CBC  PCOS (polycystic ovarian syndrome) -     norgestimate-ethinyl estradiol (MILI) 0.25-35 MG-MCG tablet; Take 1 tablet by mouth daily. NO ADDITIONAL REFILLS She has a history of PCOS and has been on spironolactone and Sprintec in the past. States she has been on Mili before with sprintec and her hirsutism was controlled. Since she has been back on sprintec her symptoms have worsened. She requested Mli tabas today however I was unaware that she continues to smoke prior to sending prescription. I do not feel comfortable prescribing as she is at increased risk of clot formation. Will defer to GYN. She has an appointment tomorrow with Women's health.  Unfortunately I was not aware that [name] is a smoker and due to risk factors of tobacco use, obesity, and oral contraceptive use I have instructed her via MyChart that I will not be able to refill any additional birth control pills for her.    Patient has been counseled on age-appropriate routine health concerns for screening and prevention. These are reviewed and up-to-date. Referrals have been placed accordingly. Immunizations are up-to-date or declined.    Subjective:   Chief Complaint  Patient presents with   Breast Problem    Swollen Breasts    Otalgia    Bilateral    HPI Sarah Evans 38 y.o. female presents to office today with complaints of bilateral ear pain, swollen breast and back pain.  Bilateral otalgia She states before she was diagnosed with bipolar disorder she would put tissue in her ears to avoid auditory hallucinations.  Feels she may have pieces of tissue  still stuck in the inner ear canal due to bilateral ear pain.  She denies any other URI symptoms associated with this ear pain.   Swollen Breasts Nontender.  States breasts appear to be swollen with onset several months ago.  She denies any palpable masses or drainage.  She has also gained about 11 pounds over the past several months which may be increasing her breast size.  Chronic back pain She notes chronic neck and back pain with onset a few months ago.  Pain is described as dull and achy.  Denies any trauma or injury to her back.  In addition to gaining 11 pounds over the past few months she also had very large braids placed in her hair around the same time that her neck and back pain started.  Spasms She endorses spasms of the bilateral hands and bilateral feet.  She states to me that she is experiencing a spasm in her left foot in the exam room however I am unable to visualize any obvious muscle spasms. Will check labs and if normal may need muscle relaxant. She states the spasms are intermittent in nature and occurring any time of the day. No aggravating factors and symptoms resolve on their own.     Review of Systems  Constitutional: Negative for fever, malaise/fatigue and weight loss.  HENT: Positive for ear pain. Negative for ear discharge, hearing  loss and nosebleeds.   Eyes: Negative.  Negative for blurred vision, double vision and photophobia.  Respiratory: Negative.  Negative for cough and shortness of breath.   Cardiovascular: Negative.  Negative for chest pain, palpitations and leg swelling.  Gastrointestinal: Negative.  Negative for heartburn, nausea and vomiting.  Genitourinary:       History of PCOS  Musculoskeletal: Positive for back pain and neck pain. Negative for myalgias.  Neurological: Negative.  Negative for dizziness, focal weakness, seizures and headaches.  Psychiatric/Behavioral: Negative.  Negative for suicidal ideas.       Bipolar 1 disorder and schizophrenia     Past Medical History:  Diagnosis Date   Bipolar 1 disorder ([place])    Polycystic ovary disease    Schizophrenia ([place])     Past Surgical History:  Procedure Laterality Date   NO PAST SURGERIES      Family History  Problem Relation Age of Onset   Diabetes Maternal Grandmother    Bipolar disorder Mother    Hypertension Neg Hx    Heart disease Neg Hx    Cancer Neg Hx     Social History Reviewed with no changes to be made today.   Outpatient Medications Prior to Visit  Medication Sig Dispense Refill   hydrOXYzine (VISTARIL) 25 MG capsule Take 25 mg by mouth 2 (two) times daily.     naproxen (NAPROSYN) 500 MG tablet Take 1 tablet (500 mg total) by mouth 2 (two) times daily with a meal. 30 tablet 0   QUEtiapine (SEROQUEL) 50 MG tablet Take 50 mg by mouth at bedtime.     SPIRONOLACTONE PO Take 100 mg by mouth 2 (two) times daily.     norgestimate-ethinyl estradiol (ORTHO-CYCLEN) 0.25-35 MG-MCG tablet Take 1 tablet by mouth daily.     spironolactone (ALDACTONE) 50 MG tablet Take 1 tablet by mouth 2 (two) times daily.     No facility-administered medications prior to visit.     No Known Allergies     Objective:    BP 125/85 (BP Location: Left Arm, Patient Position: Sitting, Cuff Size: Normal)    Pulse 97    Temp 98.9 F (37.2 C) (Oral)    Ht '5\' 6"'  (1.676 m)    Wt 195 lb (88.5 kg)    LMP 09/26/2018    SpO2 98%    BMI 31.47 kg/m  Wt Readings from Last 3 Encounters:  10/26/18 195 lb (88.5 kg)  09/03/18 185 lb (83.9 kg)  05/10/18 184 lb 12.5 oz (83.8 kg)    Physical Exam Vitals signs and nursing note reviewed.  Constitutional:      Appearance: She is well-developed.  HENT:     Head: Normocephalic and atraumatic.     Right Ear: Hearing, tympanic membrane, ear canal and external ear normal.     Left Ear: Hearing, tympanic membrane, ear canal and external ear normal.  Neck:     Musculoskeletal: Normal range of motion.  Cardiovascular:     Rate and Rhythm:  Normal rate and regular rhythm.     Heart sounds: Normal heart sounds. No murmur. No friction rub. No gallop.   Pulmonary:     Effort: Pulmonary effort is normal. No tachypnea or respiratory distress.     Breath sounds: Normal breath sounds. No decreased breath sounds, wheezing, rhonchi or rales.  Chest:     Chest wall: No mass or tenderness.     Breasts:        Right: Normal. No swelling, bleeding, inverted  nipple, mass, nipple discharge, skin change or tenderness.        Left: Normal. No swelling, bleeding, inverted nipple, mass, nipple discharge, skin change or tenderness.  Abdominal:     General: Bowel sounds are normal.     Palpations: Abdomen is soft.  Musculoskeletal: Normal range of motion.     Cervical back: She exhibits normal range of motion, no bony tenderness and no swelling.  Lymphadenopathy:     Upper Body:     Right upper body: No axillary adenopathy.     Left upper body: No axillary adenopathy.  Skin:    General: Skin is warm and dry.  Neurological:     Mental Status: She is alert and oriented to person, place, and time.     Coordination: Coordination normal.  Psychiatric:        Behavior: Behavior normal. Behavior is cooperative.        Thought Content: Thought content normal.        Judgment: Judgment normal.          Patient has been counseled extensively about nutrition and exercise as well as the importance of adherence with medications and regular follow-up. The patient was given clear instructions to go to ER or return to medical center if symptoms don't improve, worsen or new problems develop. The patient verbalized understanding.   Follow-up: No follow-ups on file.   Gildardo Pounds, FNP-BC [facility] and [place], [place]   10/26/2018, 4:55 PM

## 2018-10-26 NOTE — Addendum Note (Signed)
Addended byMariane Baumgarten on: 10/26/2018 03:01 PM   Modules accepted: Orders

## 2018-10-27 ENCOUNTER — Other Ambulatory Visit (HOSPITAL_COMMUNITY)
Admission: RE | Admit: 2018-10-27 | Discharge: 2018-10-27 | Disposition: A | Discharge status: Home / Self Care | Payer: Medicaid Other | Source: Ambulatory Visit | Attending: Family Medicine | Admitting: Family Medicine

## 2018-10-27 ENCOUNTER — Ambulatory Visit (INDEPENDENT_AMBULATORY_CARE_PROVIDER_SITE_OTHER): Payer: Self-pay

## 2018-10-27 DIAGNOSIS — N898 Other specified noninflammatory disorders of vagina: Secondary | ICD-10-CM | POA: Diagnosis present

## 2018-10-27 LAB — HEMOGLOBIN A1C
Est. average glucose Bld gHb Est-mCnc: 114 mg/dL
Hgb A1c MFr Bld: 5.6 % (ref 4.8–5.6)

## 2018-10-27 LAB — CMP14+EGFR
ALT: 36 IU/L — ABNORMAL HIGH (ref 0–32)
AST: 16 IU/L (ref 0–40)
Albumin/Globulin Ratio: 1.3 (ref 1.2–2.2)
Albumin: 4.3 g/dL (ref 3.8–4.8)
Alkaline Phosphatase: 74 IU/L (ref 39–117)
BUN/Creatinine Ratio: 12 (ref 9–23)
BUN: 12 mg/dL (ref 6–20)
Bilirubin Total: 0.3 mg/dL (ref 0.0–1.2)
CO2: 21 mmol/L (ref 20–29)
Calcium: 9.7 mg/dL (ref 8.7–10.2)
Chloride: 100 mmol/L (ref 96–106)
Creatinine, Ser: 1.04 mg/dL — ABNORMAL HIGH (ref 0.57–1.00)
GFR calc Af Amer: 80 mL/min/{1.73_m2} (ref >59)
GFR calc non Af Amer: 69 mL/min/{1.73_m2} (ref >59)
Globulin, Total: 3.3 g/dL (ref 1.5–4.5)
Glucose: 84 mg/dL (ref 65–99)
Potassium: 4.8 mmol/L (ref 3.5–5.2)
Sodium: 135 mmol/L (ref 134–144)
Total Protein: 7.6 g/dL (ref 6.0–8.5)

## 2018-10-27 LAB — B12 AND FOLATE PANEL
Folate: 7.7 ng/mL (ref >3.0)
Vitamin B-12: 301 pg/mL (ref 232–1245)

## 2018-10-27 LAB — LIPID PANEL
Chol/HDL Ratio: 2.8 ratio (ref 0.0–4.4)
Cholesterol, Total: 239 mg/dL — ABNORMAL HIGH (ref 100–199)
HDL: 85 mg/dL (ref >39)
LDL Chol Calc (NIH): 128 mg/dL — ABNORMAL HIGH (ref 0–99)
Triglycerides: 152 mg/dL — ABNORMAL HIGH (ref 0–149)
VLDL Cholesterol Cal: 26 mg/dL (ref 5–40)

## 2018-10-27 LAB — CBC
Hematocrit: 43.4 % (ref 34.0–46.6)
Hemoglobin: 14.2 g/dL (ref 11.1–15.9)
MCH: 27.6 pg (ref 26.6–33.0)
MCHC: 32.7 g/dL (ref 31.5–35.7)
MCV: 84 fL (ref 79–97)
Platelets: 438 10*3/uL (ref 150–450)
RBC: 5.15 x10E6/uL (ref 3.77–5.28)
RDW: 12.1 % (ref 11.7–15.4)
WBC: 8.1 10*3/uL (ref 3.4–10.8)

## 2018-10-27 LAB — TSH: TSH: 2.04 u[IU]/mL (ref 0.450–4.500)

## 2018-10-27 NOTE — Progress Notes (Signed)
Pt here today for self swab for c/o vaginal discharge with odor.  Pt informed on how to obtain self swab and that it will take 24-48 hrs for results.  Pt advised that we will call with abnormal results.  Pt requested to get a pregnancy test today because she is due to start her period today and it has not started yet.  I explained to the pt that I would encourage her to wait until she has missed her period for a week and then take a home pregnancy test.  Pt verbalized understanding with no further questions.   Mel Almond, RN 10/27/18

## 2018-10-27 NOTE — Progress Notes (Signed)
Patient seen and assessed by nursing staff.  Agree with documentation and plan.  

## 2018-11-02 ENCOUNTER — Telehealth: Payer: Self-pay | Admitting: Family Medicine

## 2018-11-02 NOTE — Telephone Encounter (Signed)
Patient called in wanting to know if her test results are back yet. Patient instructed that I would put a message in to the nurses and they will reach back to her as soon as they can. Patient verbalized understanding. Message sent to clinical pool.

## 2018-11-02 NOTE — Telephone Encounter (Signed)
Pt requesting lab results from 10/21.   Called Stanton Kidney in Cytology to get results from testing on 10/21. She is to call back with results.   Called Mary in Cytology back at 4:20 and she reports the BV will be run tonight and labs will be available in the morning.   Called pt at 4:23 to let her know about labs. She did not answer. LM that labs should be available in the morning and she will be called with any abnormal results.

## 2018-11-03 ENCOUNTER — Other Ambulatory Visit: Payer: Self-pay | Admitting: Family Medicine

## 2018-11-03 LAB — CERVICOVAGINAL ANCILLARY ONLY
Bacterial Vaginitis (gardnerella): POSITIVE — AB
Candida Glabrata: NEGATIVE
Candida Vaginitis: NEGATIVE
Chlamydia: NEGATIVE
Comment: NEGATIVE
Comment: NEGATIVE
Comment: NEGATIVE
Comment: NEGATIVE
Comment: NEGATIVE
Comment: NORMAL
Neisseria Gonorrhea: NEGATIVE
Trichomonas: NEGATIVE

## 2018-11-03 MED ORDER — METRONIDAZOLE 500 MG PO TABS: 500.0000 mg | ORAL_TABLET | Freq: Two times a day (BID) | ORAL | 0 refills | Status: DC

## 2018-11-03 MED FILL — metroNIDAZOLE 500 MG TABS: 500 | 7 days supply | Qty: 14 | Fill #0

## 2018-11-11 ENCOUNTER — Telehealth: Payer: Self-pay | Admitting: Nurse Practitioner

## 2018-11-11 NOTE — Telephone Encounter (Signed)
Patient encouraged with clean hands, to place a clean, warm compress to eye for 20 minutes, 4-6 times through out the day.  Advised that an office visit maybe needed since this was not addressed at the time of the [place]. Patient verbalized understanding.   Please advise if  further recommendations are needed.

## 2018-11-11 NOTE — Telephone Encounter (Signed)
Patient called saying she forgot to tell her PCP at her last OV that she's had a stye on her eye for a couple of weeks and would like to be prescribed an Rx or what can she but for it. Please f/u

## 2018-11-12 NOTE — Telephone Encounter (Signed)
Patient aware of message per PCP. Will call office to update in 1 week.

## 2018-11-12 NOTE — Telephone Encounter (Signed)
If not improved in one week will need to be referred to ENT. She can let us know if there is no improvement and I will place referral. Antibiotic eyedrops have shown little evidence in treating styes.

## 2018-11-17 ENCOUNTER — Ambulatory Visit: Payer: Medicaid Other

## 2018-12-08 ENCOUNTER — Telehealth: Payer: Self-pay | Admitting: Nurse Practitioner

## 2018-12-08 NOTE — Telephone Encounter (Signed)
Patient call and would like call back regarding her paperwork concerns.

## 2018-12-08 NOTE — Telephone Encounter (Signed)
I called Pt back, she did not answer, unable to LVM

## 2019-01-11 ENCOUNTER — Telehealth: Payer: Self-pay | Admitting: Nurse Practitioner

## 2019-01-11 NOTE — Telephone Encounter (Signed)
Patient called asking for something to help her quit smoking stated she wanted to quit so she can get back on birthcontrol

## 2019-01-13 ENCOUNTER — Other Ambulatory Visit: Payer: Self-pay | Admitting: Nurse Practitioner

## 2019-01-13 MED ORDER — NICOTINE 14 MG/24HR TD PT24: 14.0000 mg | MEDICATED_PATCH | Freq: Every day | TRANSDERMAL | 0 refills | Status: AC

## 2019-01-13 MED ORDER — NICOTINE 7 MG/24HR TD PT24
7.0000 mg | MEDICATED_PATCH | Freq: Every day | TRANSDERMAL | 0 refills | Status: DC
Start: 2019-03-25 — End: 2019-09-07

## 2019-01-13 MED ORDER — NICOTINE 21 MG/24HR TD PT24: 21.0000 mg | MEDICATED_PATCH | Freq: Every day | TRANSDERMAL | 0 refills | Status: AC

## 2019-01-13 NOTE — Telephone Encounter (Signed)
Nicotine patches have been sent. Please let her know that for birth control pills even with her not smoking she still has risk factors and would need to follow up with the health department or the women's clinic for birth control options.

## 2019-01-14 MED FILL — NICOTINE 21 MG/24HR PATCH: 21 | 28 days supply | Qty: 28 | Fill #0

## 2019-01-17 NOTE — Telephone Encounter (Signed)
Called the patient she stated that she would go to the women's clinic

## 2019-02-09 MED FILL — NICOTINE 21 MG/24HR PATCH: 21 | 14 days supply | Qty: 14 | Fill #1

## 2019-06-09 ENCOUNTER — Encounter (HOSPITAL_COMMUNITY): Payer: Self-pay | Admitting: Psychiatry

## 2019-06-09 ENCOUNTER — Ambulatory Visit (HOSPITAL_COMMUNITY): Payer: Self-pay | Admitting: Licensed Clinical Social Worker

## 2019-06-09 ENCOUNTER — Telehealth (INDEPENDENT_AMBULATORY_CARE_PROVIDER_SITE_OTHER): Payer: No Payment, Other | Admitting: Psychiatry

## 2019-06-09 ENCOUNTER — Other Ambulatory Visit: Payer: Self-pay

## 2019-06-09 ENCOUNTER — Telehealth (HOSPITAL_COMMUNITY): Payer: Self-pay | Admitting: Psychiatry

## 2019-06-09 DIAGNOSIS — F411 Generalized anxiety disorder: Secondary | ICD-10-CM | POA: Insufficient documentation

## 2019-06-09 DIAGNOSIS — F331 Major depressive disorder, recurrent, moderate: Secondary | ICD-10-CM | POA: Diagnosis not present

## 2019-06-09 DIAGNOSIS — F332 Major depressive disorder, recurrent severe without psychotic features: Secondary | ICD-10-CM

## 2019-06-09 DIAGNOSIS — F250 Schizoaffective disorder, bipolar type: Secondary | ICD-10-CM | POA: Diagnosis not present

## 2019-06-09 MED ORDER — BUSPIRONE HCL 15 MG PO TABS: 15.0000 mg | ORAL_TABLET | Freq: Two times a day (BID) | ORAL | 2 refills | Status: AC

## 2019-06-09 MED ORDER — QUETIAPINE FUMARATE 50 MG PO TABS: 50.0000 mg | ORAL_TABLET | Freq: Every day | ORAL | 2 refills | Status: DC

## 2019-06-09 MED ORDER — FLUOXETINE HCL 20 MG PO CAPS: 20.0000 mg | ORAL_CAPSULE | Freq: Every day | ORAL | 2 refills | Status: DC
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
## 2019-06-23 ENCOUNTER — Other Ambulatory Visit: Payer: Self-pay

## 2019-06-23 ENCOUNTER — Ambulatory Visit (INDEPENDENT_AMBULATORY_CARE_PROVIDER_SITE_OTHER): Payer: No Payment, Other | Admitting: Licensed Clinical Social Worker

## 2019-06-23 DIAGNOSIS — F331 Major depressive disorder, recurrent, moderate: Secondary | ICD-10-CM

## 2019-06-23 NOTE — Progress Notes (Signed)
   THERAPIST PROGRESS NOTE  Session Time: 45 min  Participation Level: Active  Behavioral Response: Well GroomedAlertDepressed  Type of Therapy: Individual Therapy  Treatment Goals addressed: Coping  Interventions: Supportive and Other: Grief education and counseling  Summary: Sarah Evans is a 39 y.o. female who presents with hx of dep/anx. Pt participated in video session this time so able to see one another and build additional rapport. Pt states her past 2 wks have been adequate. She continues to struggle with feelings of dep/anx but believes she may have had a recuction in symptoms recently. She completed med management appt and started her new med regimen yesterday. She has been off all MH meds for months. Reviewed benefits of staying on meds as prescribed. Pt appears committed to do so at this time. 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 LCSW praised her engagement in work activity. LCSW facilitated additional assessment of current relationship of 4 yrs with boyfriend, Corenza. Pt pt also open to addressing loss of infant child in 2017. Session primarily focused on grief education and counseling with some strategies to continue to remember and memorialize her son Riki Rusk. Pt open, not tearful, mostly realistic particularly after grief education provided. Reviewed poc prior to end of session. Pt would like to continue video sessions for now. Pt states appreciation for care.    Suicidal/Homicidal: Nowithout intent/plan  Therapist Response: Pt open and receptive.   Plan: Return again in ~ 2 weeks.  Diagnosis: Axis I: MDD, recurrent, moderate    Axis II: Deferred  Wayland Sink, LCSW 06/23/2019

## 2019-07-08 ENCOUNTER — Ambulatory Visit (HOSPITAL_COMMUNITY): Payer: No Payment, Other | Admitting: Licensed Clinical Social Worker

## 2019-07-08 ENCOUNTER — Other Ambulatory Visit: Payer: Self-pay

## 2019-07-29 ENCOUNTER — Ambulatory Visit (HOSPITAL_COMMUNITY): Payer: No Payment, Other | Admitting: Licensed Clinical Social Worker

## 2019-09-07 ENCOUNTER — Encounter (HOSPITAL_COMMUNITY): Payer: Self-pay | Admitting: Psychiatry

## 2019-09-07 ENCOUNTER — Other Ambulatory Visit: Payer: Self-pay

## 2019-09-07 ENCOUNTER — Telehealth (INDEPENDENT_AMBULATORY_CARE_PROVIDER_SITE_OTHER): Payer: No Payment, Other | Admitting: Psychiatry

## 2019-09-07 DIAGNOSIS — F250 Schizoaffective disorder, bipolar type: Secondary | ICD-10-CM

## 2019-09-07 DIAGNOSIS — F331 Major depressive disorder, recurrent, moderate: Secondary | ICD-10-CM

## 2019-09-07 DIAGNOSIS — F4310 Post-traumatic stress disorder, unspecified: Secondary | ICD-10-CM | POA: Diagnosis not present

## 2019-09-07 MED ORDER — QUETIAPINE FUMARATE 100 MG PO TABS: 100.0000 mg | ORAL_TABLET | Freq: Every day | ORAL | 2 refills | Status: DC

## 2019-09-07 MED ORDER — BUSPIRONE HCL 15 MG PO TABS: 15.0000 mg | ORAL_TABLET | Freq: Three times a day (TID) | ORAL | 2 refills | Status: DC

## 2019-09-07 MED ORDER — QUETIAPINE FUMARATE 50 MG PO TABS: 50.0000 mg | ORAL_TABLET | Freq: Every day | ORAL | 2 refills | Status: DC

## 2019-09-07 MED ORDER — PRAZOSIN HCL 1 MG PO CAPS: 1.0000 mg | ORAL_CAPSULE | Freq: Every day | ORAL | 1 refills | Status: DC

## 2019-09-07 MED ORDER — SERTRALINE HCL 25 MG PO TABS: 25.0000 mg | ORAL_TABLET | Freq: Every day | ORAL | 2 refills | Status: DC

## 2019-09-07 NOTE — Progress Notes (Signed)
BH MD/PA/NP OP Progress Note Virtual Visit via Video Note  I connected with Sarah Evans on 09/07/19 at  1:00 PM EDT by a video enabled telemedicine application and verified that I am speaking with the correct person using two identifiers.  Location: Patient: Home Provider: Clinic   I discussed the limitations of evaluation and management by telemedicine and the availability of in person appointments. The patient expressed understanding and agreed to proceed.  I provided 30 minutes of non-face-to-face time during this encounter.      09/07/2019 1:42 PM Sarah Evans  MRN:  [id number]  Chief Complaint: "My depression and anxiety have worsened.  HPI: 39 year old female presents for follow up psychiatric evaluation today.  She has a psychiatric history of Schizoaffective disorder, Bipolar 1, Depression, Anxiety, and PTSD. She is currently being managed on Prozac 20 mg daily, Buspar 10 mg TID, prazosin 1 mg, hydroxyzine 25 mg, and Seroquel 50 mg.  Today she notes her medications are somewhat effective in managing her psychiatric conditions.   On exam she is pleasant, cooperative, well groomed, and maintained eye contact. She notes that since starting Prozac her depression and anxiety has worsened. She notes that after she wakes up in the morning she feels extremely anxious. She notes that she would like to discontinue Prozac. She however notes that the Buspar helps with her anxiety. She endorses anhedonia, depressed mood, and irritability. She reports that her sleep has improved since being on Seroquel however notes that her mood continues to fluctuate. She denies SI/HI/VAH.  Patient is agreeable to discontinue Prozac 20 mg and start Zoloft 25 mg to help improve depression and anxiety. She is also agreeable to increase Buspar 10 mg to 15 mg to help manage anxiety. She also agreed to have Seroquel increased to help manage her mood and sleep. Patient instructed to cut Seroquel in half if she feels  over sedated. She endorsed understanding and agreed. Potential side effects of medication and risks vs benefits of treatment vs non-treatment were explained and discussed. All questions were answered. She will continue all other medications as prescribed. No other concerns noted at this time.   Visit Diagnosis:    ICD-10-CM   1. Moderate episode of recurrent major depressive disorder (HCC)  F33.1 busPIRone (BUSPAR) 15 MG tablet    sertraline (ZOLOFT) 25 MG tablet  2. Schizoaffective disorder, bipolar type (HCC)  F25.0 QUEtiapine (SEROQUEL) 100 MG tablet    DISCONTINUED: QUEtiapine (SEROQUEL) 50 MG tablet  3. PTSD (post-traumatic stress disorder)  F43.10 prazosin (MINIPRESS) 1 MG capsule    Past Psychiatric History: Schizoaffective disorder, Bipolar 1, Depression, Anxiety, and PTSD  Past Medical History:  Past Medical History:  Diagnosis Date  . Bipolar 1 disorder (HCC)   . Polycystic ovary disease   . Schizophrenia [facility])     Past Surgical History:  Procedure Laterality Date  . NO PAST SURGERIES      Family Psychiatric History: Bipolar, Schizophrenia, and Depressed. Father Bipolar disorder.   Family History:  Family History  Problem Relation Age of Onset  . Diabetes Maternal Grandmother   . Bipolar disorder Mother   . Hypertension Neg Hx   . Heart disease Neg Hx   . Cancer Neg Hx     Social History:  Social History   Socioeconomic History  . Marital status: Single    Spouse name: Not on file  . Number of children: Not on file  . Years of education: Not on file  . Highest education level:  Not on file  Occupational History  . Not on file  Tobacco Use  . Smoking status: Current Every Day Smoker    Packs/day: 0.25    Years: 15.00    Pack years: 3.75    Types: Cigarettes  . Smokeless tobacco: Never Used  Substance and Sexual Activity  . Alcohol use: No    Comment: occ  . Drug use: No  . Sexual activity: Yes    Birth control/protection: None  Other Topics Concern   . Not on file  Social History Narrative  . Not on file   Social Determinants of Health   Financial Resource Strain:   . Difficulty of Paying Living Expenses: Not on file  Food Insecurity:   . Worried About Programme researcher, broadcasting/film/video in the Last Year: Not on file  . Ran Out of Food in the Last Year: Not on file  Transportation Needs:   . Lack of Transportation (Medical): Not on file  . Lack of Transportation (Non-Medical): Not on file  Physical Activity:   . Days of Exercise per Week: Not on file  . Minutes of Exercise per Session: Not on file  Stress:   . Feeling of Stress : Not on file  Social Connections:   . Frequency of Communication with Friends and Family: Not on file  . Frequency of Social Gatherings with Friends and Family: Not on file  . Attends Religious Services: Not on file  . Active Member of Clubs or Organizations: Not on file  . Attends Banker Meetings: Not on file  . Marital Status: Not on file    Allergies: No Known Allergies  Metabolic Disorder Labs: Lab Results  Component Value Date   HGBA1C 5.6 10/26/2018   MPG 114 04/28/2015   Lab Results  Component Value Date   PROLACTIN 13.0 04/28/2015   Lab Results  Component Value Date   CHOL 239 (H) 10/26/2018   TRIG 152 (H) 10/26/2018   HDL 85 10/26/2018   CHOLHDL 2.8 10/26/2018   VLDL 18 04/28/2015   LDLCALC 128 (H) 10/26/2018   LDLCALC 140 (H) 04/28/2015   Lab Results  Component Value Date   TSH 2.040 10/26/2018   TSH 1.866 05/01/2015    Therapeutic Level Labs: No results found for: LITHIUM No results found for: VALPROATE No components found for:  CBMZ  Current Medications: Current Outpatient Medications  Medication Sig Dispense Refill  . busPIRone (BUSPAR) 15 MG tablet Take 1 tablet (15 mg total) by mouth 3 (three) times daily. 90 tablet 2  . metroNIDAZOLE (FLAGYL) 500 MG tablet Take 1 tablet (500 mg total) by mouth 2 (two) times daily. 14 tablet 0  . naproxen (NAPROSYN) 500 MG  tablet Take 1 tablet (500 mg total) by mouth 2 (two) times daily with a meal. 30 tablet 0  . norgestimate-ethinyl estradiol (MILI) 0.25-35 MG-MCG tablet Take 1 tablet by mouth daily. NO ADDITIONAL REFILLS 1 Package 0  . prazosin (MINIPRESS) 1 MG capsule Take 1 capsule (1 mg total) by mouth at bedtime. 30 capsule 1  . QUEtiapine (SEROQUEL) 100 MG tablet Take 1 tablet (100 mg total) by mouth at bedtime. 30 tablet 2  . sertraline (ZOLOFT) 25 MG tablet Take 1 tablet (25 mg total) by mouth daily. 30 tablet 2  . spironolactone (ALDACTONE) 50 MG tablet Take 1 tablet by mouth 2 (two) times daily.    Marland Kitchen SPIRONOLACTONE PO Take 100 mg by mouth 2 (two) times daily.     No current  facility-administered medications for this visit.     Musculoskeletal: Strength & Muscle Tone: Unable to assess due to telehealth visit Gait & Station: Unable to assess due to telehealth visit Patient leans: N/A  Psychiatric Specialty Exam: Review of Systems  There were no vitals taken for this visit.There is no height or weight on file to calculate BMI.  General Appearance: Well Groomed  Eye Contact:  Good  Speech:  Clear and Coherent and Normal Rate  Volume:  Normal  Mood:  Anxious and Depressed  Affect:  Congruent  Thought Process:  Coherent, Goal Directed and Linear  Orientation:  Full (Time, Place, and Person)  Thought Content: WDL and Logical   Suicidal Thoughts:  No  Homicidal Thoughts:  No  Memory:  Immediate;   Good Recent;   Good Remote;   Good  Judgement:  Good  Insight:  Good  Psychomotor Activity:  Normal  Concentration:  Concentration: Good and Attention Span: Good  Recall:  Good  Fund of Knowledge: Good  Language: Good  Akathisia:  No  Handed:  Right  AIMS (if indicated): Not done  Assets:  Communication Skills Desire for Improvement Housing Leisure Time Social Support  ADL's:  Intact  Cognition: WNL  Sleep:  Good   Screenings: AUDIT     [facility]  Alcohol Use Disorder Identification Test Final Score (AUDIT) 0       Assessment and Plan: Patient endorses increased depression and anxiety. She is agreeable to discontinue Prozac 20 mg and start Zoloft 25 mg to help improve depression and anxiety. She is also agreeable to increase Buspar 10 mg to 15 mg to help manage anxiety. She also agreed to have Seroquel increased to help manage her mood and sleep.   1. Schizoaffective disorder, bipolar type (HCC)  Increased- QUEtiapine (SEROQUEL) 100 MG tablet; Take 1 tablet (100 mg total) by mouth at bedtime.  Dispense: 30 tablet; Refill: 2  2. Moderate episode of recurrent major depressive disorder (HCC)  Increased- busPIRone (BUSPAR) 15 MG tablet; Take 1 tablet (15 mg total) by mouth 3 (three) times daily.  Dispense: 90 tablet; Refill: 2 Start- sertraline (ZOLOFT) 25 MG tablet; Take 1 tablet (25 mg total) by mouth daily.  Dispense: 30 tablet; Refill: 2  3. PTSD (post-traumatic stress disorder)  Continue- prazosin (MINIPRESS) 1 MG capsule; Take 1 capsule (1 mg total) by mouth at bedtime.  Dispense: 30 capsule; Refill: 1  Follow up in 3 months Follow up with threapy   Shanna Cisco, NP 09/07/2019, 1:42 PM

## 2019-11-01 ENCOUNTER — Other Ambulatory Visit: Payer: Self-pay | Admitting: Nurse Practitioner

## 2019-11-01 ENCOUNTER — Telehealth: Payer: Self-pay | Admitting: Nurse Practitioner

## 2019-11-01 DIAGNOSIS — E282 Polycystic ovarian syndrome: Secondary | ICD-10-CM

## 2019-11-01 NOTE — Telephone Encounter (Signed)
Pt has not been seen since oct 2020. Pt is calling to let zelda know the medication she gave her to stop smoking has worked. Pt stopped smoking for about 6 month now. Pt is at work multitasking and I did not make an appt. Pt would like to know if zelda would give her another refill on spironolactone 100 mg twice a day. [facility] [address] in [place]

## 2019-11-01 NOTE — Telephone Encounter (Signed)
Copied from CRM [id number]. Topic: General - Other >> Nov 01, 2019  2:03 PM Tamela Oddi wrote: Reason for CRM: Patient would like the nurse or doctor to call her regarding a referral to a GYN.  Please advise and call patient to discuss at [phone]

## 2019-11-01 NOTE — Telephone Encounter (Signed)
Refill request for Mil 0.25-35 mg-mcg last filled 07/11/19; no valid encounter within last 12 months; no upcoming visit noted; attempted to contact pt; left message on voicemail; courtesy refill granted; visit needed for additional refills. Requested Prescriptions  Pending Prescriptions Disp Refills  . MILI 0.25-35 MG-MCG tablet [Pharmacy Med Name: MILI TABLETS 28S] 28 tablet 0    Sig: TAKE 1 TABLET BY MOUTH DAILY     OB/GYN:  Contraceptives Failed - 11/01/2019 12:58 PM      Failed - Valid encounter within last 12 months    Recent Outpatient Visits          1 year ago Ear pain, bilateral   [facility] [facility] [place], Shea Stakes, NP   1 year ago Encounter to establish care   [facility] [place], [place] W, NP             Passed - Last BP in normal range    BP Readings from Last 1 Encounters:  10/26/18 125/85

## 2019-11-01 NOTE — Telephone Encounter (Signed)
Requested medication (s) are due for refill today: Spironalactone, yes  Requested medication (s) are on the active medication list: yes  Last refill:  ?  Future visit scheduled: no  Notes to clinic:  no valid encounter within last 6 months, historical provider    Requested Prescriptions  Pending Prescriptions Disp Refills   spironolactone (ALDACTONE) 50 MG tablet      Sig: Take 1 tablet (50 mg total) by mouth 2 (two) times daily.      Cardiovascular: Diuretics - Aldosterone Antagonist Failed - 11/01/2019  1:36 PM      Failed - Cr in normal range and within 360 days    Creatinine, Ser  Date Value Ref Range Status  10/26/2018 1.04 (H) 0.57 - 1.00 mg/dL Final          Failed - K in normal range and within 360 days    Potassium  Date Value Ref Range Status  10/26/2018 4.8 3.5 - 5.2 mmol/L Final          Failed - Na in normal range and within 360 days    Sodium  Date Value Ref Range Status  10/26/2018 135 134 - 144 mmol/L Final          Failed - Valid encounter within last 6 months    Recent Outpatient Visits           1 year ago Ear pain, bilateral   [place] [facility] [place], Shea Stakes, NP   1 year ago Encounter to establish care   [facility] [place], [place] W, NP              Passed - Last BP in normal range    BP Readings from Last 1 Encounters:  10/26/18 125/85

## 2019-11-03 NOTE — Telephone Encounter (Signed)
Attempt to call patient back regarding for the referral request to GYN. If patient call back, please ask what's the reason for GYN referral? CMA LVM.

## 2019-11-04 NOTE — Telephone Encounter (Signed)
Attempted to call pt.  Left vm to return call to office to discuss need for GYN referral.

## 2019-11-24 ENCOUNTER — Ambulatory Visit: Payer: Medicaid Other | Admitting: Obstetrics and Gynecology

## 2019-12-05 ENCOUNTER — Ambulatory Visit: Payer: Self-pay | Admitting: Nurse Practitioner

## 2019-12-06 ENCOUNTER — Other Ambulatory Visit: Payer: Self-pay

## 2019-12-06 ENCOUNTER — Encounter (HOSPITAL_COMMUNITY): Payer: Self-pay | Admitting: Psychiatry

## 2019-12-06 ENCOUNTER — Telehealth (INDEPENDENT_AMBULATORY_CARE_PROVIDER_SITE_OTHER): Payer: No Payment, Other | Admitting: Psychiatry

## 2019-12-06 DIAGNOSIS — F331 Major depressive disorder, recurrent, moderate: Secondary | ICD-10-CM

## 2019-12-06 DIAGNOSIS — F250 Schizoaffective disorder, bipolar type: Secondary | ICD-10-CM | POA: Diagnosis not present

## 2019-12-06 DIAGNOSIS — F4310 Post-traumatic stress disorder, unspecified: Secondary | ICD-10-CM

## 2019-12-06 MED ORDER — QUETIAPINE FUMARATE 100 MG PO TABS: 100.0000 mg | ORAL_TABLET | Freq: Every day | ORAL | 2 refills | Status: DC

## 2019-12-06 MED ORDER — QUETIAPINE FUMARATE 25 MG PO TABS: 25.0000 mg | ORAL_TABLET | Freq: Two times a day (BID) | ORAL | 2 refills | Status: DC

## 2019-12-06 MED ORDER — PRAZOSIN HCL 1 MG PO CAPS: 1.0000 mg | ORAL_CAPSULE | Freq: Every day | ORAL | 1 refills | Status: DC

## 2019-12-06 MED ORDER — SERTRALINE HCL 50 MG PO TABS: 50.0000 mg | ORAL_TABLET | Freq: Every day | ORAL | 2 refills | Status: DC

## 2019-12-06 MED ORDER — BUSPIRONE HCL 15 MG PO TABS: 15.0000 mg | ORAL_TABLET | Freq: Three times a day (TID) | ORAL | 2 refills | Status: DC

## 2019-12-06 NOTE — Progress Notes (Signed)
BH MD/PA/NP OP Progress Note Virtual Visit via Telephone Note  I connected with Sarah Evans on 12/06/19 at  1:00 PM EST by telephone and verified that I am speaking with the correct person using two identifiers.  Location: Patient: home Provider: Clinic   I discussed the limitations, risks, security and privacy concerns of performing an evaluation and management service by telephone and the availability of in person appointments. I also discussed with the patient that there may be a patient responsible charge related to this service. The patient expressed understanding and agreed to proceed.   I provided 30 minutes of non-face-to-face time during this encounter.       12/06/2019 1:24 PM Sarah Evans  MRN:  [id number]  Chief Complaint: "My anxiety and nightmares are under control by my depression and irritability is not"  HPI: 39 year old female seen today for for follow up psychiatric evaluation today.  She has a psychiatric history of Schizoaffective disorder, Bipolar 1, Depression, Anxiety, and PTSD. She is currently being managed on  Zoloft 25 daily, Buspar 15 mg TID, prazosin 1 mg, hydroxyzine 25 mg, and Seroquel 100 mg.  Today she notes her medications are somewhat effective in managing her psychiatric conditions.   Patient unable to log in virtually so exam was done over the phone.  On exam she is pleasant, cooperative, and engaged in conversation.  She informed provider that her anxiety and nightmares are under control however reported that she is still feeling depressed.  She informed provider that she was out with a friend and reported that she had limited time.  Provider conducted a PHQ-9 and patient scored a 13.  She denies SI/HI/VH or paranoia.  She informed provider that she feels like her mood is somewhat stable however notes that she is still has days where she is really irritable.  She informed provider that she likes how Seroquel makes her feel and reports that she would  like to have a dose in the daytime to help manage her mood and irritability.    Patient is agreeable to increase Zoloft 25 mg to 50 mg to help manage symptoms of depression.  She is also agreeable to starting Seroquel 25 mg twice daily to help manage mood and irritability.  She will continue all other medications as prescribed.  Potential side effects of medication and risks vs benefits of treatment vs non-treatment were explained and discussed. All questions were answered. She will continue all other medications as prescribed. No other concerns noted at this time.   Visit Diagnosis:    ICD-10-CM   1. Moderate episode of recurrent major depressive disorder (HCC)  F33.1 busPIRone (BUSPAR) 15 MG tablet    sertraline (ZOLOFT) 50 MG tablet  2. PTSD (post-traumatic stress disorder)  F43.10 prazosin (MINIPRESS) 1 MG capsule  3. Schizoaffective disorder, bipolar type (HCC)  F25.0 QUEtiapine (SEROQUEL) 100 MG tablet    QUEtiapine (SEROQUEL) 25 MG tablet    Past Psychiatric History: Schizoaffective disorder, Bipolar 1, Depression, Anxiety, and PTSD  Past Medical History:  Past Medical History:  Diagnosis Date  . Bipolar 1 disorder (HCC)   . Polycystic ovary disease   . Schizophrenia [facility])     Past Surgical History:  Procedure Laterality Date  . NO PAST SURGERIES      Family Psychiatric History: Bipolar, Schizophrenia, and Depressed. Father Bipolar disorder.   Family History:  Family History  Problem Relation Age of Onset  . Diabetes Maternal Grandmother   . Bipolar disorder Mother   .  Hypertension Neg Hx   . Heart disease Neg Hx   . Cancer Neg Hx     Social History:  Social History   Socioeconomic History  . Marital status: Single    Spouse name: Not on file  . Number of children: Not on file  . Years of education: Not on file  . Highest education level: Not on file  Occupational History  . Not on file  Tobacco Use  . Smoking status: Current Every Day Smoker    Packs/day:  0.25    Years: 15.00    Pack years: 3.75    Types: Cigarettes  . Smokeless tobacco: Never Used  Substance and Sexual Activity  . Alcohol use: No    Comment: occ  . Drug use: No  . Sexual activity: Yes    Birth control/protection: None  Other Topics Concern  . Not on file  Social History Narrative  . Not on file   Social Determinants of Health   Financial Resource Strain:   . Difficulty of Paying Living Expenses: Not on file  Food Insecurity:   . Worried About Programme researcher, broadcasting/film/video in the Last Year: Not on file  . Ran Out of Food in the Last Year: Not on file  Transportation Needs:   . Lack of Transportation (Medical): Not on file  . Lack of Transportation (Non-Medical): Not on file  Physical Activity:   . Days of Exercise per Week: Not on file  . Minutes of Exercise per Session: Not on file  Stress:   . Feeling of Stress : Not on file  Social Connections:   . Frequency of Communication with Friends and Family: Not on file  . Frequency of Social Gatherings with Friends and Family: Not on file  . Attends Religious Services: Not on file  . Active Member of Clubs or Organizations: Not on file  . Attends Banker Meetings: Not on file  . Marital Status: Not on file    Allergies: No Known Allergies  Metabolic Disorder Labs: Lab Results  Component Value Date   HGBA1C 5.6 10/26/2018   MPG 114 04/28/2015   Lab Results  Component Value Date   PROLACTIN 13.0 04/28/2015   Lab Results  Component Value Date   CHOL 239 (H) 10/26/2018   TRIG 152 (H) 10/26/2018   HDL 85 10/26/2018   CHOLHDL 2.8 10/26/2018   VLDL 18 04/28/2015   LDLCALC 128 (H) 10/26/2018   LDLCALC 140 (H) 04/28/2015   Lab Results  Component Value Date   TSH 2.040 10/26/2018   TSH 1.866 05/01/2015    Therapeutic Level Labs: No results found for: LITHIUM No results found for: VALPROATE No components found for:  CBMZ  Current Medications: Current Outpatient Medications  Medication Sig  Dispense Refill  . busPIRone (BUSPAR) 15 MG tablet Take 1 tablet (15 mg total) by mouth 3 (three) times daily. 90 tablet 2  . metroNIDAZOLE (FLAGYL) 500 MG tablet Take 1 tablet (500 mg total) by mouth 2 (two) times daily. 14 tablet 0  . MILI 0.25-35 MG-MCG tablet TAKE 1 TABLET BY MOUTH DAILY 28 tablet 0  . naproxen (NAPROSYN) 500 MG tablet Take 1 tablet (500 mg total) by mouth 2 (two) times daily with a meal. 30 tablet 0  . prazosin (MINIPRESS) 1 MG capsule Take 1 capsule (1 mg total) by mouth at bedtime. 30 capsule 1  . QUEtiapine (SEROQUEL) 100 MG tablet Take 1 tablet (100 mg total) by mouth at bedtime. 30  tablet 2  . QUEtiapine (SEROQUEL) 25 MG tablet Take 1 tablet (25 mg total) by mouth 2 (two) times daily. 60 tablet 2  . sertraline (ZOLOFT) 50 MG tablet Take 1 tablet (50 mg total) by mouth daily. 30 tablet 2  . spironolactone (ALDACTONE) 50 MG tablet Take 1 tablet by mouth 2 (two) times daily.    Marland Kitchen SPIRONOLACTONE PO Take 100 mg by mouth 2 (two) times daily.     No current facility-administered medications for this visit.     Musculoskeletal: Strength & Muscle Tone: Unable to assess due to telephone visit Gait & Station: Unable to assess due to telephone visit Patient leans: N/A  Psychiatric Specialty Exam: Review of Systems  There were no vitals taken for this visit.There is no height or weight on file to calculate BMI.  General Appearance: Unable to assess due to telephone visit  Eye Contact:  Unable to assess due to telephone visit  Speech:  Clear and Coherent and Normal Rate  Volume:  Normal  Mood:  Depressed  Affect:  Congruent  Thought Process:  Coherent, Goal Directed and Linear  Orientation:  Full (Time, Place, and Person)  Thought Content: WDL and Logical   Suicidal Thoughts:  No  Homicidal Thoughts:  No  Memory:  Immediate;   Good Recent;   Good Remote;   Good  Judgement:  Good  Insight:  Good  Psychomotor Activity:  Normal  Concentration:  Concentration: Good  and Attention Span: Good  Recall:  Good  Fund of Knowledge: Good  Language: Good  Akathisia:  No  Handed:  Right  AIMS (if indicated): Not done  Assets:  Communication Skills Desire for Improvement Housing Leisure Time Social Support  ADL's:  Intact  Cognition: WNL  Sleep:  Good   Screenings: AUDIT     [facility]  Alcohol Use Disorder Identification Test Final Score (AUDIT) 0    PHQ2-9     Video Visit from 12/06/2019 in [facility]  PHQ-2 Total Score 4  PHQ-9 Total Score 13       Assessment and Plan: Patient endorses increased depression and irritability. Patient is agreeable to increase Zoloft 25 mg to 50 mg to help manage symptoms of depression.  She is also agreeable to starting Seroquel 25 mg twice daily to help manage mood and irritability.  She will continue all other medications as prescribed.     1. Moderate episode of recurrent major depressive disorder (HCC)  Continue- busPIRone (BUSPAR) 15 MG tablet; Take 1 tablet (15 mg total) by mouth 3 (three) times daily.  Dispense: 90 tablet; Refill: 2 Increased- sertraline (ZOLOFT) 50 MG tablet; Take 1 tablet (50 mg total) by mouth daily.  Dispense: 30 tablet; Refill: 2  2. PTSD (post-traumatic stress disorder)  Continue- prazosin (MINIPRESS) 1 MG capsule; Take 1 capsule (1 mg total) by mouth at bedtime.  Dispense: 30 capsule; Refill: 1  3. Schizoaffective disorder, bipolar type (HCC)  Continue- QUEtiapine (SEROQUEL) 100 MG tablet; Take 1 tablet (100 mg total) by mouth at bedtime.  Dispense: 30 tablet; Refill: 2 Start- QUEtiapine (SEROQUEL) 25 MG tablet; Take 1 tablet (25 mg total) by mouth 2 (two) times daily.  Dispense: 60 tablet; Refill: 2   Follow up in 3 months Follow up with threapy   Shanna Cisco, NP 12/06/2019, 1:24 PM

## 2020-01-11 ENCOUNTER — Ambulatory Visit: Payer: Self-pay | Admitting: Nurse Practitioner

## 2020-01-19 ENCOUNTER — Other Ambulatory Visit: Payer: Self-pay

## 2020-01-19 ENCOUNTER — Ambulatory Visit: Payer: Self-pay

## 2020-01-19 ENCOUNTER — Encounter (HOSPITAL_COMMUNITY): Payer: Self-pay

## 2020-01-19 ENCOUNTER — Emergency Department (HOSPITAL_COMMUNITY)
Admission: EM | Admit: 2020-01-19 | Discharge: 2020-01-19 | Disposition: A | Discharge status: Home / Self Care | Payer: Self-pay | Attending: Emergency Medicine | Admitting: Emergency Medicine

## 2020-01-19 ENCOUNTER — Emergency Department (HOSPITAL_COMMUNITY): Payer: Self-pay

## 2020-01-19 ENCOUNTER — Other Ambulatory Visit (HOSPITAL_COMMUNITY): Payer: Self-pay | Admitting: Emergency Medicine

## 2020-01-19 DIAGNOSIS — I2510 Atherosclerotic heart disease of native coronary artery without angina pectoris: Secondary | ICD-10-CM | POA: Insufficient documentation

## 2020-01-19 DIAGNOSIS — R0789 Other chest pain: Secondary | ICD-10-CM | POA: Insufficient documentation

## 2020-01-19 DIAGNOSIS — F17210 Nicotine dependence, cigarettes, uncomplicated: Secondary | ICD-10-CM | POA: Insufficient documentation

## 2020-01-19 DIAGNOSIS — R079 Chest pain, unspecified: Secondary | ICD-10-CM

## 2020-01-19 LAB — CBC
HCT: 42.2 % (ref 36.0–46.0)
Hemoglobin: 13.1 g/dL (ref 12.0–15.0)
MCH: 27.2 pg (ref 26.0–34.0)
MCHC: 31 g/dL (ref 30.0–36.0)
MCV: 87.7 fL (ref 80.0–100.0)
Platelets: 346 10*3/uL (ref 150–400)
RBC: 4.81 MIL/uL (ref 3.87–5.11)
RDW: 14.4 % (ref 11.5–15.5)
WBC: 7 10*3/uL (ref 4.0–10.5)
nRBC: 0 % (ref 0.0–0.2)

## 2020-01-19 LAB — BASIC METABOLIC PANEL
Anion gap: 11 (ref 5–15)
BUN: 11 mg/dL (ref 6–20)
CO2: 21 mmol/L — ABNORMAL LOW (ref 22–32)
Calcium: 8.9 mg/dL (ref 8.9–10.3)
Chloride: 106 mmol/L (ref 98–111)
Creatinine, Ser: 0.92 mg/dL (ref 0.44–1.00)
GFR, Estimated: >60 mL/min (ref >60)
Glucose, Bld: 89 mg/dL (ref 70–99)
Potassium: 4 mmol/L (ref 3.5–5.1)
Sodium: 138 mmol/L (ref 135–145)

## 2020-01-19 LAB — I-STAT BETA HCG BLOOD, ED (MC, WL, AP ONLY): I-stat hCG, quantitative: <5 m[IU]/mL (ref <5)

## 2020-01-19 LAB — TROPONIN I (HIGH SENSITIVITY): Troponin I (High Sensitivity): <2 ng/L (ref <18)

## 2020-01-19 MED ORDER — KETOROLAC TROMETHAMINE 10 MG PO TABS: 10.0000 mg | ORAL_TABLET | Freq: Four times a day (QID) | ORAL | 0 refills | Status: DC | PRN

## 2020-01-19 MED ORDER — KETOROLAC TROMETHAMINE 60 MG/2ML IM SOLN
30.0000 mg | Freq: Once | INTRAMUSCULAR | Status: AC
  Administered 2020-01-19: 30 mg via INTRAMUSCULAR
  Filled 2020-01-19: qty 2

## 2020-01-19 MED ORDER — CYCLOBENZAPRINE HCL 10 MG PO TABS
10.0000 mg | ORAL_TABLET | Freq: Once | ORAL | Status: AC
  Administered 2020-01-19: 10 mg via ORAL
  Filled 2020-01-19: qty 1

## 2020-01-19 MED ORDER — OXYCODONE-ACETAMINOPHEN 5-325 MG PO TABS
1.0000 | ORAL_TABLET | Freq: Once | ORAL | Status: AC
Start: 2020-01-19 — End: 2020-01-19
  Administered 2020-01-19: 1 via ORAL
  Filled 2020-01-19: qty 1

## 2020-01-19 MED ORDER — CYCLOBENZAPRINE HCL 10 MG PO TABS: 10.0000 mg | ORAL_TABLET | Freq: Three times a day (TID) | ORAL | 0 refills | Status: DC | PRN

## 2020-01-19 NOTE — ED Provider Notes (Signed)
[facility] EMERGENCY DEPARTMENT Provider Note   CSN: [id number] Arrival date & time: 01/19/20  1140     History Chief Complaint  Patient presents with  . Chest Pain    Sarah Evans is a 40 y.o. female.  Presents to ER with concern for chest pain.  Patient reports that for the last 3 years she has struggled with various muscle cramping and aching.  States that this has been relatively constant though seems to have worsened over the last few weeks but normally does not have specific chest pain.  Over the last 3 to 4 days has had increasing chest pain, constant, aching, worse with certain positions.  Not associated with exertion.  Not changed with deep breathing or coughing.  No fever.  Denies any known history of CAD, PE/DVT.  No family history of CAD.  HPI     Past Medical History:  Diagnosis Date  . Bipolar 1 disorder (HCC)   . Polycystic ovary disease   . Schizophrenia [facility])     Patient Active Problem List   Diagnosis Date Noted  . Moderate episode of recurrent major depressive disorder (HCC) 06/09/2019  . Generalized anxiety disorder 06/09/2019  . PTSD (post-traumatic stress disorder) 04/30/2015  . Schizoaffective disorder, bipolar type (HCC) 04/27/2015    Past Surgical History:  Procedure Laterality Date  . NO PAST SURGERIES       OB History   No obstetric history on file.     Family History  Problem Relation Age of Onset  . Diabetes Maternal Grandmother   . Bipolar disorder Mother   . Hypertension Neg Hx   . Heart disease Neg Hx   . Cancer Neg Hx     Social History   Tobacco Use  . Smoking status: Current Every Day Smoker    Packs/day: 0.25    Years: 15.00    Pack years: 3.75    Types: Cigarettes  . Smokeless tobacco: Never Used  Substance Use Topics  . Alcohol use: No    Comment: occ  . Drug use: No    Home Medications Prior to Admission medications   Medication Sig Start Date End Date Taking? Authorizing Provider   cyclobenzaprine (FLEXERIL) 10 MG tablet Take 1 tablet (10 mg total) by mouth 3 (three) times daily as needed for muscle spasms. 01/19/20  Yes Milagros Loll, MD  ketorolac (TORADOL) 10 MG tablet Take 1 tablet (10 mg total) by mouth every 6 (six) hours as needed. 01/19/20  Yes Adrea Sherpa, Quitman Livings, MD  busPIRone (BUSPAR) 15 MG tablet Take 1 tablet (15 mg total) by mouth 3 (three) times daily. 12/06/19   Shanna Cisco, NP  metroNIDAZOLE (FLAGYL) 500 MG tablet Take 1 tablet (500 mg total) by mouth 2 (two) times daily. 11/03/18   Reva Bores, MD  MILI 0.25-35 MG-MCG tablet TAKE 1 TABLET BY MOUTH DAILY 11/01/19   Claiborne Rigg, NP  naproxen (NAPROSYN) 500 MG tablet Take 1 tablet (500 mg total) by mouth 2 (two) times daily with a meal. 09/05/18   Claiborne Rigg, NP  prazosin (MINIPRESS) 1 MG capsule Take 1 capsule (1 mg total) by mouth at bedtime. 12/06/19   Shanna Cisco, NP  QUEtiapine (SEROQUEL) 100 MG tablet Take 1 tablet (100 mg total) by mouth at bedtime. 12/06/19   Shanna Cisco, NP  QUEtiapine (SEROQUEL) 25 MG tablet Take 1 tablet (25 mg total) by mouth 2 (two) times daily. 12/06/19   Toy Cookey  E, NP  sertraline (ZOLOFT) 50 MG tablet Take 1 tablet (50 mg total) by mouth daily. 12/06/19   Shanna Cisco, NP  spironolactone (ALDACTONE) 50 MG tablet Take 1 tablet by mouth 2 (two) times daily. 04/07/18   [provider]  SPIRONOLACTONE PO Take 100 mg by mouth 2 (two) times daily.    [provider]    Allergies    Patient has no known allergies.  Review of Systems   Review of Systems  Constitutional: Negative for chills and fever.  HENT: Negative for ear pain and sore throat.   Eyes: Negative for pain and visual disturbance.  Respiratory: Positive for shortness of breath. Negative for cough.   Cardiovascular: Positive for chest pain. Negative for palpitations.  Gastrointestinal: Negative for abdominal pain and vomiting.  Genitourinary:  Negative for dysuria and hematuria.  Musculoskeletal: Negative for arthralgias and back pain.  Skin: Negative for color change and rash.  Neurological: Negative for seizures and syncope.  All other systems reviewed and are negative.   Physical Exam Updated Vital Signs BP 119/87 (BP Location: Right Arm)   Pulse 87   Temp 98.1 F (36.7 C) (Oral)   Resp 16   LMP 12/19/2019   SpO2 98%   Physical Exam Vitals and nursing note reviewed.  Constitutional:      General: She is not in acute distress.    Appearance: She is well-developed and well-nourished.  HENT:     Head: Normocephalic and atraumatic.  Eyes:     Conjunctiva/sclera: Conjunctivae normal.  Cardiovascular:     Rate and Rhythm: Normal rate and regular rhythm.     Heart sounds: No murmur heard.   Pulmonary:     Effort: Pulmonary effort is normal. No respiratory distress.     Breath sounds: Normal breath sounds.  Chest:     Comments: Tenderness to palpation across anterior chest wall Abdominal:     Palpations: Abdomen is soft.     Tenderness: There is no abdominal tenderness.  Musculoskeletal:        General: No edema.     Cervical back: Neck supple.  Skin:    General: Skin is warm and dry.     Capillary Refill: Capillary refill takes less than 2 seconds.  Neurological:     General: No focal deficit present.     Mental Status: She is alert.  Psychiatric:        Mood and Affect: Mood and affect and mood normal.        Behavior: Behavior normal.     ED Results / Procedures / Treatments   Labs (all labs ordered are listed, but only abnormal results are displayed) Labs Reviewed  BASIC METABOLIC PANEL - Abnormal; Notable for the following components:      Result Value   CO2 21 (*)    All other components within normal limits  CBC  I-STAT BETA HCG BLOOD, ED (MC, [facility], AP ONLY)  TROPONIN I (HIGH SENSITIVITY)  TROPONIN I (HIGH SENSITIVITY)    EKG EKG Interpretation  Date/Time:  Thursday January 19 2020  11:44:30 EST Ventricular Rate:  78 PR Interval:  124 QRS Duration: 74 QT Interval:  372 QTC Calculation: 424 R Axis:   72 Text Interpretation: Normal sinus rhythm Normal ECG Confirmed by Marianna Fuss ([id number]) on 01/19/2020 2:28:20 PM   Radiology DG Chest 2 View  Result Date: 01/19/2020 CLINICAL DATA:  Chest pain EXAM: CHEST - 2 VIEW COMPARISON:  October 11, 2014 FINDINGS: The lungs  are clear. The heart size and pulmonary vascularity are normal. No adenopathy. No pneumothorax. No bone lesions. A metallic foreign body is noted in the right upper lobe, stable. IMPRESSION: No edema or airspace opacity. Cardiac silhouette within normal limits Electronically Signed   By: Bretta Bang III M.D.   On: 01/19/2020 12:11    Procedures Procedures (including critical care time)  Medications Ordered in ED Medications  ketorolac (TORADOL) injection 30 mg (has no administration in time range)  oxyCODONE-acetaminophen (PERCOCET/ROXICET) 5-325 MG per tablet 1 tablet (has no administration in time range)  cyclobenzaprine (FLEXERIL) tablet 10 mg (has no administration in time range)    ED Course  I have reviewed the triage vital signs and the nursing notes.  Pertinent labs & imaging results that were available during my care of the patient were reviewed by me and considered in my medical decision making (see chart for details).    MDM Rules/Calculators/A&P                         40 year old lady presents to ER with concern for chest pain.  On physical exam she is well-appearing with normal vital signs.  No tachypnea hypoxia or tachycardia to suggest PE.  EKG is without acute ischemic changes and troponin is undetectable.  Doubt ACS.  She has tenderness across her anterior chest wall, suspect most likely MSK in etiology.  Recommend muscle relaxers, NSAIDs and follow-up with primary.   After the discussed management above, the patient was determined to be safe for discharge.  The patient was in  agreement with this plan and all questions regarding their care were answered.  ED return precautions were discussed and the patient will return to the ED with any significant worsening of condition.  Final Clinical Impression(s) / ED Diagnoses Final diagnoses:  Chest pain, unspecified type    Rx / DC Orders ED Discharge Orders         Ordered    ketorolac (TORADOL) 10 MG tablet  Every 6 hours PRN        01/19/20 1456    cyclobenzaprine (FLEXERIL) 10 MG tablet  3 times daily PRN        01/19/20 1456           Milagros Loll, MD 01/19/20 1501

## 2020-01-19 NOTE — ED Triage Notes (Signed)
Pt reports severe rigtht sided chest pain that started 2 days ago. Pt also reports sob, swelling under both her arms and neck. Resp e.u at this time.

## 2020-01-19 NOTE — Telephone Encounter (Signed)
Returned call to patient  Patient  states that she has severe chest pain.  It started 2 days ago  It is right side under her breast and travels to back and under arm on rt side.  She states she feelsthat her underarm is swollen.  She states the pain causes her SOB nausea vomiting and dizziness.  She has Hx of PCOS .  She is currently taking birth control pills. Per protocol patient will go to ER for evaluation of this pain. Care advice read to patient.  She verbalized understanding of all information.  Reason for Disposition . SEVERE chest pain  Answer Assessment - Initial Assessment Questions 1. LOCATION: "Where does it hurt?"       Rt side behind breast 2. RADIATION: "Does the pain go anywhere else?" (e.g., into neck, jaw, arms, back)     Yes to back and underarms 3. ONSET: "When did the chest pain begin?" (Minutes, hours or days)      2 days ago 4. PATTERN "Does the pain come and go, or has it been constant since it started?"  "Does it get worse with exertion?"      constant 5. DURATION: "How long does it last" (e.g., seconds, minutes, hours)     constant 6. SEVERITY: "How bad is the pain?"  (e.g., Scale 1-10; mild, moderate, or severe)    - MILD (1-3): doesn't interfere with normal activities     - MODERATE (4-7): interferes with normal activities or awakens from sleep    - SEVERE (8-10): excruciating pain, unable to do any normal activities       Severe difficult to take deep breath and move 7. CARDIAC RISK FACTORS: "Do you have any history of heart problems or risk factors for heart disease?" (e.g., angina, prior heart attack; diabetes, high blood pressure, high cholesterol, smoker, or strong family history of heart disease)    PCOS 8. PULMONARY RISK FACTORS: "Do you have any history of lung disease?"  (e.g., blood clots in lung, asthma, emphysema, birth control pills)     yes 9. CAUSE: "What do you think is causing the chest pain?"     unsure 10. OTHER SYMPTOMS: "Do you have any  other symptoms?" (e.g., dizziness, nausea, vomiting, sweating, fever, difficulty breathing, cough)      Dizzy vomiting nausea difficult take breath cough 11. PREGNANCY: "Is there any chance you are pregnant?" "When was your last menstrual period?"       No birth control  Protocols used: CHEST PAIN-A-AH

## 2020-01-19 NOTE — Discharge Instructions (Addendum)
Recommend anti-inflammatory such as the prescribed Toradol or Motrin or naproxen.  Additionally recommend muscle relaxer as needed.  Follow-up with your primary doctor.  Return for worsening chest pain or difficulty in breathing.

## 2020-01-20 MED FILL — KETOROLAC 10 MG TABLET: 10 | 5 days supply | Qty: 20 | Fill #0

## 2020-01-20 MED FILL — CYCLOBENZAPRINE 10 MG TAB: 10 | 6 days supply | Qty: 20 | Fill #0

## 2020-01-24 NOTE — Telephone Encounter (Signed)
Patient went to Emergency department to be evaluated.

## 2020-02-06 ENCOUNTER — Ambulatory Visit: Payer: Self-pay | Admitting: Nurse Practitioner

## 2020-02-29 ENCOUNTER — Other Ambulatory Visit: Payer: Self-pay | Admitting: Nurse Practitioner

## 2020-02-29 NOTE — Telephone Encounter (Signed)
Copied from CRM [id number]. Topic: Quick Communication - Rx Refill/Question >> Feb 29, 2020  9:37 AM Jaquita Rector A wrote: Medication: spironolactone (ALDACTONE) 50 MG tablet   Has the patient contacted their pharmacy? Yes.   (Agent: If no, request that the patient contact the pharmacy for the refill.) (Agent: If yes, when and what did the pharmacy advise?)  Preferred Pharmacy (with phone number or street name): [id number] [place], [place] - [address] AT [facility] & [address]  Phone:  [phone] Fax:  [phone]     Agent: Please be advised that RX refills may take up to 3 business days. We ask that you follow-up with your pharmacy.

## 2020-02-29 NOTE — Telephone Encounter (Signed)
  Notes to clinic: medication filled by a historical provider Review for refill    Requested Prescriptions  Pending Prescriptions Disp Refills   spironolactone (ALDACTONE) 50 MG tablet      Sig: Take 1 tablet (50 mg total) by mouth 2 (two) times daily.      Cardiovascular: Diuretics - Aldosterone Antagonist Failed - 02/29/2020  9:42 AM      Failed - Valid encounter within last 6 months    Recent Outpatient Visits           1 year ago Ear pain, bilateral   [facility] [place], [place] W, NP   1 year ago Encounter to establish care   [facility] [place], Shon Hale W, NP                Passed - Cr in normal range and within 360 days    Creatinine, Ser  Date Value Ref Range Status  01/19/2020 0.92 0.44 - 1.00 mg/dL Final          Passed - K in normal range and within 360 days    Potassium  Date Value Ref Range Status  01/19/2020 4.0 3.5 - 5.1 mmol/L Final          Passed - Na in normal range and within 360 days    Sodium  Date Value Ref Range Status  01/19/2020 138 135 - 145 mmol/L Final  10/26/2018 135 134 - 144 mmol/L Final          Passed - Last BP in normal range    BP Readings from Last 1 Encounters:  01/19/20 119/87

## 2020-03-05 ENCOUNTER — Other Ambulatory Visit: Payer: Self-pay

## 2020-03-05 ENCOUNTER — Telehealth (HOSPITAL_COMMUNITY): Payer: Self-pay | Admitting: Psychiatry

## 2020-03-09 ENCOUNTER — Ambulatory Visit: Payer: Self-pay | Admitting: *Deleted

## 2020-03-09 NOTE — Telephone Encounter (Signed)
Pt called in c/o severe muscle pains "all over her body".  When I asked her more specifically where she was having spasms she listed off "my legs, my arms, my stomach, my back, everywhere".  She said the spasms started after having a baby in 2017.  Has never seen a doctor for this.  She has no idea why she is having these spasms.  She specifically requested to have a female doctor.  She has seen Bertram Denver, NP so I scheduled her with Dr. Shan Levans for 04/12/2020 for 8:30 AM MyChart video visit. Covid questionnaire completed and she said she was having diarrhea, loss of taste/smell and a runny nose.   Covid test was negative but has not retested.    (She was vague in her answers.   I had to ask a lot of detailed questions for answers).    Reason for Disposition . Body pains are a chronic symptom (recurrent or ongoing AND present > 4 weeks)  Answer Assessment - Initial Assessment Questions 1. ONSET: "When did the muscle aches or body pains start?"      Having muscle spasms every where.  My feet, legs,arms, stomach everywhere for the last 3 yrs.    I went to [facility] for this same problem.  Flexeril does not work.    I had baby in 2017.  After that the cramps started.   I've not seen a doctor about this. 2. LOCATION: "What part of your body is hurting?" (e.g., entire body, arms, legs)      All over my body.   I have muscle spasms so bad I can't sit still.   It's so exhausting. 3. SEVERITY: "How bad is the pain?" (Scale 1-10; or mild, moderate, severe)   - MILD (1-3): doesn't interfere with normal activities    - MODERATE (4-7): interferes with normal activities or awakens from sleep    - SEVERE (8-10):  excruciating pain, unable to do any normal activities      10 4. CAUSE: "What do you think is causing the pains?"     I don't know 5. FEVER: "Have you been having fever?"     No fever   6. OTHER SYMPTOMS: "Do you have any other symptoms?" (e.g., chest pain, weakness, rash, cold or flu symptoms,  weight loss)     I had a rash on both of my legs for the last 2 yrs. 7. PREGNANCY: "Is there any chance you are pregnant?" "When was your last menstrual period?"     No 8. TRAVEL: "Have you traveled out of the country in the last month?" (e.g., travel history, exposures)     No exposures  Protocols used: MUSCLE ACHES AND BODY PAIN-A-AH

## 2020-04-02 ENCOUNTER — Encounter: Payer: Self-pay | Admitting: Family Medicine

## 2020-04-02 ENCOUNTER — Other Ambulatory Visit: Payer: Self-pay | Admitting: Family Medicine

## 2020-04-02 ENCOUNTER — Ambulatory Visit: Payer: Self-pay | Attending: Nurse Practitioner

## 2020-04-02 ENCOUNTER — Ambulatory Visit (INDEPENDENT_AMBULATORY_CARE_PROVIDER_SITE_OTHER): Payer: Self-pay | Admitting: Family Medicine

## 2020-04-02 ENCOUNTER — Other Ambulatory Visit: Payer: Self-pay

## 2020-04-02 VITALS — BP 134/86 | HR 89 | Ht 67.0 in | Wt 202.7 lb

## 2020-04-02 DIAGNOSIS — Z6831 Body mass index (BMI) 31.0-31.9, adult: Secondary | ICD-10-CM

## 2020-04-02 DIAGNOSIS — E282 Polycystic ovarian syndrome: Secondary | ICD-10-CM

## 2020-04-02 DIAGNOSIS — M62838 Other muscle spasm: Secondary | ICD-10-CM

## 2020-04-02 DIAGNOSIS — E669 Obesity, unspecified: Secondary | ICD-10-CM

## 2020-04-02 DIAGNOSIS — Z01419 Encounter for gynecological examination (general) (routine) without abnormal findings: Secondary | ICD-10-CM

## 2020-04-02 MED ORDER — SPIRONOLACTONE 100 MG PO TABS: 100.0000 mg | ORAL_TABLET | Freq: Two times a day (BID) | ORAL | 11 refills | Status: DC

## 2020-04-02 MED ORDER — NORGESTIMATE-ETH ESTRADIOL 0.25-35 MG-MCG PO TABS: 1.0000 | ORAL_TABLET | Freq: Every day | ORAL | 11 refills | Status: DC

## 2020-04-02 MED FILL — SPIRONOLACTONE 100 MG TAB: 100 | 30 days supply | Qty: 60 | Fill #0

## 2020-04-02 NOTE — Patient Instructions (Signed)
-  follow up with primary care provider -continue spironolactone 100 mg twice daily and daily birth control -when you decide to get pregnant, stop these meds and start prenatal vitamins -call the clinic if you have an outbreak to be seen -work on diet and exercise

## 2020-04-02 NOTE — Progress Notes (Signed)
GYNECOLOGY ANNUAL PREVENTATIVE CARE ENCOUNTER NOTE  History:     Sarah Evans is a 40 y.o. G86P0100 female here for a routine annual gynecologic exam.  Current complaints: none.   Denies abnormal vaginal bleeding, discharge, pelvic pain, problems with intercourse or other gynecologic concerns.     PCOS -diagnosed at [facility] based on ultrasound -currently taking OCPs and spironolactone 100 mg BID -states this regimen is working well -endorses regularly monthly cycles lasting 1 month, 1 pad/day -desires pregnancy in 2-3 years -no recent sudden weight gain/loss -not currently exercising  Muscle spasms -general, full body -only drinking 3 water bottles daily -not related to movement -not painful  Gynecologic History Patient's last menstrual period was 03/19/2020 (exact date). Contraception: OCP (estrogen/progesterone), not sexually active Last Pap: 01/29/18. Results were: normal with negative HPV Last mammogram: never given age and no family history.  Obstetric History OB History  Gravida Para Term Preterm AB Living  1 1 0 1 0 0  SAB IAB Ectopic Multiple Live Births  0 0 0 0 1    # Outcome Date GA Lbr Len/2nd Weight Sex Delivery Anes PTL Lv  1 Preterm 11/27/15 [redacted]w[redacted]d      ND    Past Medical History:  Diagnosis Date  . B[place] 1 disorder ([place]   . Herpes simplex virus (HSV) infection    dx 2001  . Polycystic ovary disease   . Schizophrenia ([facility]     Past Surgical History:  Procedure Laterality Date  . NO PAST SURGERIES      Current Outpatient Medications on File Prior to Visit  Medication Sig Dispense Refill  . busPIRone (BUSPAR) 15 MG tablet Take 1 tablet (15 mg total) by mouth 3 (three) times daily. 90 tablet 2  . cyclobenzaprine (FLEXERIL) 10 MG tablet Take 1 tablet (10 mg total) by mouth 3 (three) times daily as needed for muscle spasms. 20 tablet 0  . ketorolac (TORADOL) 10 MG tablet Take 1 tablet (10 mg total) by mouth every 6 (six) hours as needed. 20  tablet 0  . naproxen (NAPROSYN) 500 MG tablet Take 1 tablet (500 mg total) by mouth 2 (two) times daily with a meal. 30 tablet 0  . prazosin (MINIPRESS) 1 MG capsule Take 1 capsule (1 mg total) by mouth at bedtime. 30 capsule 1  . QUEtiapine (SEROQUEL) 100 MG tablet Take 1 tablet (100 mg total) by mouth at bedtime. 30 tablet 2  . QUEtiapine (SEROQUEL) 25 MG tablet Take 1 tablet (25 mg total) by mouth 2 (two) times daily. 60 tablet 2  . sertraline (ZOLOFT) 50 MG tablet Take 1 tablet (50 mg total) by mouth daily. 30 tablet 2  . spironolactone (ALDACTONE) 100 MG tablet Inject into the vein.     No current facility-administered medications on file prior to visit.    No Known Allergies  Social History:  reports that she has quit smoking. Her smoking use included cigarettes. She has a 3.75 pack-year smoking history. She has never used smokeless tobacco. She reports that she does not drink alcohol and does not use drugs.  Family History  Problem Relation Age of Onset  . Diabetes Maternal Grandmother   . Bipolar disorder Mother   . Hypertension Neg Hx   . Heart disease Neg Hx   . Cancer Neg Hx     The following portions of the patient's history were reviewed and updated as appropriate: allergies, current medications, past family history, past medical history, past social  history, past surgical history and problem list.  Review of Systems Pertinent items noted in HPI and remainder of comprehensive ROS otherwise negative.  Physical Exam:  BP 134/86   Pulse 89   Ht '5\' 7"'  (1.702 m)   Wt 202 lb 11.2 oz (91.9 kg)   LMP 03/19/2020 (Exact Date)   BMI 31.75 kg/m  CONSTITUTIONAL: Well-developed, well-nourished female in no acute distress.  HENT:  Normocephalic, atraumatic, External right and left ear normal. Oropharynx is clear and moist EYES: Conjunctivae and EOM are normal. Pupils are equal, round, and reactive to light. No scleral icterus.  NECK: Normal range of motion, supple, no masses.   Normal thyroid.  SKIN: Skin is warm and dry. No rash noted. Not diaphoretic. No erythema. No pallor. MUSCULOSKELETAL: Normal range of motion. No tenderness.  No cyanosis, clubbing, or edema.  2+ distal pulses. NEUROLOGIC: Alert and oriented to person, place, and time. Normal reflexes, muscle tone coordination.  PSYCHIATRIC: Normal mood and affect. Normal behavior. Normal judgment and thought content. CARDIOVASCULAR: Normal heart rate noted, regular rhythm RESPIRATORY: Clear to auscultation bilaterally. Effort and breath sounds normal, no problems with respiration noted. BREASTS: Symmetric in size. No masses, tenderness, skin changes, nipple drainage, or lymphadenopathy bilaterally. Performed in the presence of a chaperone. ABDOMEN: Soft, no distention noted.  No tenderness, rebound or guarding.  PELVIC: deferred   Assessment and Plan:       Women's annual routine gynecological examination -pap UTD, due 01/2023 -exam unremarkable -BP elevated, encouraged to f/u with PCP  Class 1 obesity without serious comorbidity with body mass index (BMI) of 31.0 to 31.9 in adult, unspecified obesity type Patient with family history of thyroid abnormality, would like to be checked, last TSH 2 years ago nml. Given PCOS will check labs. -     TSH -     HgB A1c -     Comp Met (CMET) -     Lipid Profile  Muscle spasm General, full body, likely in the setting of dehydration. Encouraged 100oz water daily and will check for hypomag as well as iron def anemia. -     CBC -     Magnesium  PCOS (polycystic ovarian syndrome) Diagnosed based on oligomenorrhea and ultrasound at [facility]. Stable on current regimen. Requesting med refills today. Desires pregnancy in 2-3 years, discussed discontinuing meds at that time, starting prenatal vitamin and trying x6 months. Also discussed the risks of AMA pregnancy. -     norgestimate-ethinyl estradiol (MILI) 0.25-35 MG-MCG tablet; Take 1 tablet by mouth daily. -      spironolactone (ALDACTONE) 100 MG tablet; Take 1 tablet (100 mg total) by mouth 2 (two) times daily.    Will follow up results of pap smear and manage accordingly. Routine preventative health maintenance measures emphasized. Please refer to After Visit Summary for other counseling recommendations.      Arrie Senate, MD OB Fellow, [place] [facility] [place] 04/02/2020 9:10 AM

## 2020-04-03 LAB — LIPID PANEL
Chol/HDL Ratio: 2.9 ratio (ref 0.0–4.4)
Cholesterol, Total: 199 mg/dL (ref 100–199)
HDL: 69 mg/dL (ref >39)
LDL Chol Calc (NIH): 110 mg/dL — ABNORMAL HIGH (ref 0–99)
Triglycerides: 114 mg/dL (ref 0–149)
VLDL Cholesterol Cal: 20 mg/dL (ref 5–40)

## 2020-04-03 LAB — COMPREHENSIVE METABOLIC PANEL
ALT: 30 IU/L (ref 0–32)
AST: 19 IU/L (ref 0–40)
Albumin/Globulin Ratio: 1.4 (ref 1.2–2.2)
Albumin: 3.9 g/dL (ref 3.8–4.8)
Alkaline Phosphatase: 93 IU/L (ref 44–121)
BUN/Creatinine Ratio: 13 (ref 9–23)
BUN: 14 mg/dL (ref 6–20)
Bilirubin Total: <0.2 mg/dL (ref 0.0–1.2)
CO2: 18 mmol/L — ABNORMAL LOW (ref 20–29)
Calcium: 8.9 mg/dL (ref 8.7–10.2)
Chloride: 107 mmol/L — ABNORMAL HIGH (ref 96–106)
Creatinine, Ser: 1.1 mg/dL — ABNORMAL HIGH (ref 0.57–1.00)
Globulin, Total: 2.8 g/dL (ref 1.5–4.5)
Glucose: 90 mg/dL (ref 65–99)
Potassium: 4.4 mmol/L (ref 3.5–5.2)
Sodium: 140 mmol/L (ref 134–144)
Total Protein: 6.7 g/dL (ref 6.0–8.5)
eGFR: 66 mL/min/{1.73_m2} (ref >59)

## 2020-04-03 LAB — CBC
Hematocrit: 41.1 % (ref 34.0–46.6)
Hemoglobin: 13.1 g/dL (ref 11.1–15.9)
MCH: 27.1 pg (ref 26.6–33.0)
MCHC: 31.9 g/dL (ref 31.5–35.7)
MCV: 85 fL (ref 79–97)
Platelets: 337 10*3/uL (ref 150–450)
RBC: 4.83 x10E6/uL (ref 3.77–5.28)
RDW: 12.5 % (ref 11.7–15.4)
WBC: 7 10*3/uL (ref 3.4–10.8)

## 2020-04-03 LAB — HEMOGLOBIN A1C
Est. average glucose Bld gHb Est-mCnc: 126 mg/dL
Hgb A1c MFr Bld: 6 % — ABNORMAL HIGH (ref 4.8–5.6)

## 2020-04-03 LAB — TSH: TSH: 2.11 u[IU]/mL (ref 0.450–4.500)

## 2020-04-03 LAB — MAGNESIUM: Magnesium: 1.8 mg/dL (ref 1.6–2.3)

## 2020-04-09 ENCOUNTER — Telehealth: Payer: Self-pay

## 2020-04-11 NOTE — Telephone Encounter (Signed)
error 

## 2020-04-12 ENCOUNTER — Other Ambulatory Visit: Payer: Self-pay

## 2020-04-12 ENCOUNTER — Ambulatory Visit: Payer: Self-pay | Attending: Critical Care Medicine | Admitting: Critical Care Medicine

## 2020-04-12 ENCOUNTER — Encounter: Payer: Self-pay | Admitting: Critical Care Medicine

## 2020-04-12 DIAGNOSIS — F250 Schizoaffective disorder, bipolar type: Secondary | ICD-10-CM

## 2020-04-12 DIAGNOSIS — R7303 Prediabetes: Secondary | ICD-10-CM | POA: Insufficient documentation

## 2020-04-12 DIAGNOSIS — E1165 Type 2 diabetes mellitus with hyperglycemia: Secondary | ICD-10-CM

## 2020-04-12 DIAGNOSIS — M62838 Other muscle spasm: Secondary | ICD-10-CM

## 2020-04-12 DIAGNOSIS — E119 Type 2 diabetes mellitus without complications: Secondary | ICD-10-CM | POA: Insufficient documentation

## 2020-04-12 DIAGNOSIS — E782 Mixed hyperlipidemia: Secondary | ICD-10-CM | POA: Insufficient documentation

## 2020-04-12 DIAGNOSIS — L2084 Intrinsic (allergic) eczema: Secondary | ICD-10-CM

## 2020-04-12 DIAGNOSIS — E1169 Type 2 diabetes mellitus with other specified complication: Secondary | ICD-10-CM

## 2020-04-12 DIAGNOSIS — E785 Hyperlipidemia, unspecified: Secondary | ICD-10-CM

## 2020-04-12 MED ORDER — ATORVASTATIN CALCIUM 10 MG PO TABS
10.0000 mg | ORAL_TABLET | Freq: Every day | ORAL | 3 refills | Status: DC
  Filled 2020-04-12: qty 30, 30d supply, fill #0

## 2020-04-12 MED ORDER — CLOBETASOL PROPIONATE 0.05 % EX CREA
1.0000 "application " | TOPICAL_CREAM | Freq: Two times a day (BID) | CUTANEOUS | 0 refills | Status: DC
  Filled 2020-04-12: qty 30, 15d supply, fill #0

## 2020-04-12 MED ORDER — METFORMIN HCL 500 MG PO TABS
500.0000 mg | ORAL_TABLET | Freq: Every day | ORAL | 3 refills | Status: DC
  Filled 2020-04-12: qty 30, 30d supply, fill #0

## 2020-04-12 MED ORDER — TIZANIDINE HCL 4 MG PO TABS
4.0000 mg | ORAL_TABLET | Freq: Three times a day (TID) | ORAL | 0 refills | Status: DC | PRN
  Filled 2020-04-12: qty 90, 30d supply, fill #0

## 2020-04-12 NOTE — Assessment & Plan Note (Signed)
Eczematous rash aggravated by volume depletion  Plan will be for this patient to receive clobetasol cream to both lower extremities and moisturize the skin

## 2020-04-12 NOTE — Assessment & Plan Note (Signed)
Plan to begin atorvastatin 10 mg daily

## 2020-04-12 NOTE — Assessment & Plan Note (Signed)
Schizoaffective bipolar  Care per mental health provider I asked the patient to follow-up with her mental health provider for further medication adjustments

## 2020-04-12 NOTE — Assessment & Plan Note (Signed)
Discontinue Flexeril and begin tenacity and as needed  I suspect the patient may have restless leg syndrome she does not have insurance once we get the orange card established we will endeavor to get this patient a sleep study she may have restless leg syndrome

## 2020-04-12 NOTE — Assessment & Plan Note (Signed)
The patient exhibits full-blown type 2 diabetes note with recent lab draw there was elevations in blood sugar and a hemoglobin A1c of 6.0  I believe this patient would benefit from institution of Metformin therapy once daily 500 mg  I also went over with the patient a healthy diet.  She may benefit from a sleep study at some point I will also begin atorvastatin 10 mg daily

## 2020-04-12 NOTE — Progress Notes (Signed)
Subjective:    Patient ID: Sarah Evans, female    DOB: 1980/05/02, 40 y.o.   MRN: [id number] Virtual Visit via Video Note  I connected with@ on 04/12/20 at@ by a video enabled telemedicine application and verified that I am speaking with the correct person using two identifiers.   Consent:  I discussed the limitations, risks, security and privacy concerns of performing an evaluation and management service by video visit and the availability of in person appointments. I also discussed with the patient that there may be a patient responsible charge related to this service. The patient expressed understanding and agreed to proceed.  Location of patient: Patient's at home  Location of provider: I am in my office  Persons participating in the televisit with the patient.   No one else on the call    History of Present Illness: 40 y.o.F  PCP [place]    04/12/2020 This is a 40 year old female former patient of Dr. Reita Cliche seeking a new PCP and here as a work in visit.  This was a video visit which we accomplished using the video system and also my phone in the office for the audio component.  The patient states since 2017 she has had difficulty with pain in her legs all over it is worse in the night.  She is never been evaluated for restless leg syndrome and she has no history of iron deficiency.  She notes increased urine output increasing thirst increased polyaphasia.  Patient notes occasional nausea.  She also has bilateral lower leg rashes.  She works part-time as a good job as a Conservation officer, nature.  She has never had a sleep study but is interested in obtaining one.  She has a history of schizoaffective bipolar disorder and is on chronic medications including Seroquel buspirone for her mental health and takes praises and at night to help with sleep.  She still has sleep disruption.  She is on the Aldactone for polycystic ovary syndrome.  She is on 100 mg twice daily for this.  The patient used to be on  Metformin but is been off this since 2017.  She does have hyperlipidemia and is interested in receiving statin therapy for this.  She has a mental health provider she follows with and she states currently she is having difficulty with increased nervousness anxiousness and lack of focus.  See review of systems. She went to the emergency room and was given Flexeril this does not help the muscle spasm   Past Medical History:  Diagnosis Date  . Bipolar 1 disorder (HCC)   . Herpes simplex virus (HSV) infection    dx 2001  . Polycystic ovary disease   . Schizophrenia (HCC)      Family History  Problem Relation Age of Onset  . Diabetes Maternal Grandmother   . Bipolar disorder Mother   . Hypertension Neg Hx   . Heart disease Neg Hx   . Cancer Neg Hx      Social History   Socioeconomic History  . Marital status: Single    Spouse name: Not on file  . Number of children: Not on file  . Years of education: Not on file  . Highest education level: Not on file  Occupational History  . Not on file  Tobacco Use  . Smoking status: Former Smoker    Packs/day: 0.25    Years: 15.00    Pack years: 3.75    Types: Cigarettes  . Smokeless tobacco: Never Used  Vaping Use  . Vaping Use: Never used  Substance and Sexual Activity  . Alcohol use: No    Comment: occ  . Drug use: No  . Sexual activity: Not Currently    Birth control/protection: Abstinence  Other Topics Concern  . Not on file  Social History Narrative  . Not on file   Social Determinants of Health   Financial Resource Strain: Not on file  Food Insecurity: Not on file  Transportation Needs: Not on file  Physical Activity: Not on file  Stress: Not on file  Social Connections: Not on file  Intimate Partner Violence: Not on file     No Known Allergies   Outpatient Medications Prior to Visit  Medication Sig Dispense Refill  . busPIRone (BUSPAR) 15 MG tablet Take 1 tablet (15 mg total) by mouth 3 (three) times daily. 90  tablet 2  . naproxen (NAPROSYN) 500 MG tablet Take 1 tablet (500 mg total) by mouth 2 (two) times daily with a meal. 30 tablet 0  . norgestimate-ethinyl estradiol (MILI) 0.25-35 MG-MCG tablet Take 1 tablet by mouth daily. 28 tablet 11  . prazosin (MINIPRESS) 1 MG capsule Take 1 capsule (1 mg total) by mouth at bedtime. 30 capsule 1  . QUEtiapine (SEROQUEL) 100 MG tablet Take 1 tablet (100 mg total) by mouth at bedtime. 30 tablet 2  . QUEtiapine (SEROQUEL) 25 MG tablet Take 1 tablet (25 mg total) by mouth 2 (two) times daily. 60 tablet 2  . spironolactone (ALDACTONE) 100 MG tablet TAKE 1 TABLET BY MOUTH 2 TIMES DAILY 60 tablet 11  . cyclobenzaprine (FLEXERIL) 10 MG tablet TAKE 1 TABLET BY MOUTH 3 TIMES DAILY AS NEEDED FOR MUSCLE SPASMS. (Patient not taking: Reported on 04/12/2020) 20 tablet 0  . ketorolac (TORADOL) 10 MG tablet TAKE 1 TABLET BY MOUTH EVERY 6 HOURS AS NEEDED. (Patient not taking: Reported on 04/12/2020) 20 tablet 0  . sertraline (ZOLOFT) 50 MG tablet Take 1 tablet (50 mg total) by mouth daily. (Patient not taking: Reported on 04/12/2020) 30 tablet 2  . spironolactone (ALDACTONE) 100 MG tablet Inject into the vein.     No facility-administered medications prior to visit.     Review of Systems  HENT: Negative.   Respiratory: Negative.   Cardiovascular: Positive for palpitations. Negative for chest pain and leg swelling.  Gastrointestinal: Positive for nausea. Negative for constipation.  Endocrine: Positive for polydipsia, polyphagia and polyuria.  Genitourinary: Negative for dysuria and flank pain.  Musculoskeletal: Positive for back pain.  Skin: Negative.   Neurological: Positive for weakness, light-headedness and headaches.  Psychiatric/Behavioral: Positive for decreased concentration, dysphoric mood and sleep disturbance. Negative for self-injury and suicidal ideas. The patient is nervous/anxious and is hyperactive.        Objective:   Physical Exam  Patient seen on the  video system she is at her home she is in no acute distress  The patient showed me her lower legs on her both pretibial areas there is a eczematous type rash and very dry skin is observed      Assessment & Plan:  I personally reviewed all images and lab data in the [facility] system as well as any outside material available during this office visit and agree with the  radiology impressions.   Uncontrolled type 2 diabetes mellitus (HCC) The patient exhibits full-blown type 2 diabetes note with recent lab draw there was elevations in blood sugar and a hemoglobin A1c of 6.0  I believe this patient would benefit  from institution of Metformin therapy once daily 500 mg  I also went over with the patient a healthy diet.  She may benefit from a sleep study at some point I will also begin atorvastatin 10 mg daily  Intrinsic eczema Eczematous rash aggravated by volume depletion  Plan will be for this patient to receive clobetasol cream to both lower extremities and moisturize the skin  Hyperlipidemia associated with type 2 diabetes mellitus (HCC) Plan to begin atorvastatin 10 mg daily  Schizoaffective disorder, bipolar type (HCC) Schizoaffective bipolar  Care per mental health provider I asked the patient to follow-up with her mental health provider for further medication adjustments  Muscle spasticity Discontinue Flexeril and begin tenacity and as needed  I suspect the patient may have restless leg syndrome she does not have insurance once we get the orange card established we will endeavor to get this patient a sleep study she may have restless leg syndrome   Diagnoses and all orders for this visit:  Muscle spasticity  Uncontrolled type 2 diabetes mellitus with hyperglycemia (HCC)  Intrinsic eczema  Hyperlipidemia associated with type 2 diabetes mellitus (HCC)  Schizoaffective disorder, bipolar type (HCC)  Other orders -     tiZANidine (ZANAFLEX) 4 MG tablet; Take 1 tablet (4 mg  total) by mouth 3 (three) times daily as needed for muscle spasms. -     metFORMIN (GLUCOPHAGE) 500 MG tablet; Take 1 tablet (500 mg total) by mouth daily with breakfast. -     atorvastatin (LIPITOR) 10 MG tablet; Take 1 tablet (10 mg total) by mouth daily. -     clobetasol cream (TEMOVATE) 0.05 %; Apply 1 application topically 2 (two) times daily.   I spent 35 minutes face-to-face with this patient on video as well as reviewing other records in the chart and formulating a plan of care with complex medical decision making  Follow Up Instructions: The patient knows a follow-up in office exam will occur in May will obtain labs at that visit   I discussed the assessment and treatment plan with the patient. The patient was provided an opportunity to ask questions and all were answered. The patient agreed with the plan and demonstrated an understanding of the instructions.   The patient was advised to call back or seek an in-person evaluation if the symptoms worsen or if the condition fails to improve as anticipated.  I provided 35 minutes of non-face-to-face time during this encounter  including  median intraservice time , review of notes, labs, imaging, medications  and explaining diagnosis and management to the patient .    Shan Levans, MD

## 2020-04-14 MED ORDER — POLYETHYLENE GLYCOL 3350 17 GM/SCOOP PO POWD
17.0000 g | Freq: Two times a day (BID) | ORAL | 1 refills | Status: DC | PRN
Start: 2020-04-14 — End: 2020-05-28
  Filled 2020-04-14: qty 476, 14d supply, fill #0

## 2020-04-16 ENCOUNTER — Ambulatory Visit: Payer: Self-pay | Attending: Critical Care Medicine

## 2020-04-16 ENCOUNTER — Other Ambulatory Visit: Payer: Self-pay

## 2020-04-23 ENCOUNTER — Telehealth (HOSPITAL_COMMUNITY): Payer: Self-pay | Admitting: Psychiatry

## 2020-04-23 ENCOUNTER — Other Ambulatory Visit: Payer: Self-pay

## 2020-05-01 ENCOUNTER — Telehealth: Payer: Self-pay | Admitting: Critical Care Medicine

## 2020-05-01 NOTE — Telephone Encounter (Signed)
Pt was sent a letter from financial dept. Inform them, that the application they submitted was incomplete, since they were missing some documentation at the time of the appointment, Pt need to reschedule and resubmit all new papers and application for [employer] and [employer], P.S. old documents has been sent back by mail to the Pt and Pt. need to make a new appt. 

## 2020-05-11 ENCOUNTER — Other Ambulatory Visit: Payer: Self-pay

## 2020-05-14 ENCOUNTER — Telehealth: Payer: Self-pay | Admitting: Family Medicine

## 2020-05-14 NOTE — Telephone Encounter (Addendum)
Returned patient's call. Patient reports that she would like to be seen today. She reports the doctor informed her to come back once she started demonstrating symptoms. She reports she will not be able to give information on what she needs to be tested for. She reports she was diagnosed about 15 years ago with something and would not disclose name or diagnosis. She declined to answer when asked if for HSV.   Per chart review, patient was to "contact office with outbreak for evaluation".   Clarified with patient prior to speak with front desk and patient reported she was not available on Tuesday. Patient was offered Wednesday appointment at 10 am and she reports she is not available the rest of the week as she is working.   Patient became angry wants to speak with a Production designer, theatre/television/film. Spoke with Mayra Neer, RN who is not available at this moment and will call patient back.

## 2020-05-14 NOTE — Telephone Encounter (Signed)
Pt called with personal concerns, I offered self swab visit for tomorrow morning- pt states she can not come tomorrow or any day this week. I offered to send to nurse line and she said she will call back. Pt did not want to give details or medical info about nur visit, she just agreed to come for self swab due to personal issues.I asked was it for STD and she just said ok yes so she can get visit but did not want to come tomorrow (first avail) Please call pt, maybe she will advise Sarah Evans of situation.

## 2020-05-15 ENCOUNTER — Other Ambulatory Visit: Payer: Self-pay

## 2020-05-16 ENCOUNTER — Other Ambulatory Visit: Payer: Self-pay

## 2020-05-16 ENCOUNTER — Encounter: Payer: Self-pay | Admitting: Obstetrics & Gynecology

## 2020-05-16 ENCOUNTER — Other Ambulatory Visit: Payer: Self-pay | Admitting: Obstetrics & Gynecology

## 2020-05-16 ENCOUNTER — Ambulatory Visit (INDEPENDENT_AMBULATORY_CARE_PROVIDER_SITE_OTHER): Payer: Self-pay | Admitting: Obstetrics & Gynecology

## 2020-05-16 VITALS — BP 129/79 | HR 80 | Ht 67.0 in | Wt 189.4 lb

## 2020-05-16 DIAGNOSIS — A6004 Herpesviral vulvovaginitis: Secondary | ICD-10-CM

## 2020-05-16 DIAGNOSIS — R112 Nausea with vomiting, unspecified: Secondary | ICD-10-CM

## 2020-05-16 MED ORDER — PANTOPRAZOLE SODIUM 20 MG PO TBEC
20.0000 mg | DELAYED_RELEASE_TABLET | Freq: Every day | ORAL | 1 refills | Status: DC
  Filled 2020-05-16 – 2021-05-10 (×3): qty 30, 30d supply, fill #0

## 2020-05-16 MED ORDER — VALACYCLOVIR HCL 1 G PO TABS
1000.0000 mg | ORAL_TABLET | Freq: Every day | ORAL | 2 refills | Status: DC
  Filled 2020-05-16 – 2020-05-17 (×2): qty 5, 5d supply, fill #0

## 2020-05-16 NOTE — Patient Instructions (Signed)
[email or web address]/[email or web address]-[email or web address]-[email or web address]">  Genital Herpes Genital herpes is a common sexually transmitted infection (STI) that is caused by a virus. The virus spreads from person to person through sexual contact. The infection can cause itching, blisters, and sores around the genitals or rectum. Symptoms may last for several days and then go away. This is called an outbreak. The virus remains in the body, however, so more outbreaks may happen in the future. The time between outbreaks varies and can be from months to years. Genital herpes can affect anyone. It is particularly concerning for pregnant women because the virus can be passed to the baby during delivery and can cause serious problems. Genital herpes is also a concern for people who have a weak disease-fighting system (immune system). What are the causes? This condition is caused by the human herpesvirus, also called herpes simplex virus, type 1 or type 2 (HSV-1 or HSV-2). The virus may spread through:  Sexual contact with an infected person, including vaginal, anal, and oral sex.  Contact with fluid from a herpes sore.  The skin. This means that you can get herpes from an infected partner even if there are no blisters or sores present. Your partner may not know that he or she is infected. What increases the risk? You are more likely to develop this condition if:  You have sex with many partners.  You do not use latex condoms during sex. What are the signs or symptoms? Most people do not have symptoms (are asymptomatic), or they have mild symptoms that may be mistaken for other skin problems. Symptoms may include:  Small, red bumps near the genitals, rectum, or mouth. These bumps turn into blisters and then sores.  Flu-like symptoms, including: ? Fever. ? Body aches. ? Swollen lymph nodes. ? Headache.  Painful urination.  Pain and itching in the genital area or rectal  area.  Vaginal discharge.  Tingling or shooting pain in the legs and buttocks. Generally, symptoms are more severe and last longer during the first (primary) outbreak. Flu-like symptoms are also more common during the primary outbreak.   How is this diagnosed? This condition may be diagnosed based on:  A physical exam.  Your medical history.  Blood tests.  A test of a fluid sample (culture) from an open sore. How is this treated? There is no cure for this condition, but treatment with antiviral medicines that are taken by mouth (orally) can do the following:  Speed up healing and relieve symptoms.  Help to reduce the spread of the virus to sexual partners.  Limit the chance of future outbreaks, or make future outbreaks shorter.  Lessen symptoms of future outbreaks. Your health care provider may also recommend pain relief medicines, such as aspirin or ibuprofen. Follow these instructions at home: If you have an outbreak:  Keep the affected areas dry and clean.  Avoid rubbing or touching blisters and sores. If you do touch blisters or sores: ? Wash your hands thoroughly with soap and water. ? Do not touch your eyes afterward.  To help relieve pain or itching, you may take the following actions as told by your health care provider: ? Apply a cold, wet cloth (cold compress) to affected areas 4-6 times a day. ? Apply a substance that protects your skin and reduces bleeding (astringent). ? Apply a gel that helps relieve pain around sores (lidocaine gel). ? Take a warm, shallow bath that cleans the genital area (sitz bath). Sexual activity  Do not have sexual contact during   active outbreaks.  Practice safe sex. Herpes can spread even if your partner does not have blisters or sores. Latex condoms and female condoms may help prevent the spread of the herpes virus. General instructions  Take over-the-counter and prescription medicines only as told by your health care  provider.  Keep all follow-up visits as told by your health care provider. This is important. How is this prevented?  Use condoms. Although you can get genital herpes during sexual contact even with the use of a condom, a condom can provide some protection.  Avoid having multiple sexual partners.  Talk with your sexual partner about any symptoms either of you may have. Also, talk with your partner about any history of STIs.  Get tested for STIs before you have sex. Ask your partner to do the same.  Do not have sexual contact if you have active symptoms of genital herpes. Contact a health care provider if:  Your symptoms are not improving with medicine.  Your symptoms return, or you have new symptoms.  You have a fever.  You have abdominal pain.  You have redness, swelling, or pain in your eye.  You notice new sores on other parts of your body.  You are a woman and you experience bleeding between menstrual periods.  You have had herpes and you become pregnant or plan to become pregnant. Summary  Genital herpes is a common sexually transmitted infection (STI) that is caused by the herpes simplex virus, type 1 or type 2 (HSV-1 or HSV-2).  These viruses are most often spread through sexual contact with an infected person.  You are more likely to develop this condition if you have sex with many partners or you do not use condoms during sex.  Most people do not have symptoms (are asymptomatic) or have mild symptoms that may be mistaken for other skin problems. Symptoms occur as outbreaks that may happen months or years apart.  There is no cure for this condition, but treatment with oral antiviral medicines can reduce symptoms, reduce the chance of spreading the virus to a partner, prevent future outbreaks, or shorten future outbreaks. This information is not intended to replace advice given to you by your health care provider. Make sure you discuss any questions you have with your  health care provider. Document Revised: 09/02/2018 Document Reviewed: 09/02/2018 Elsevier Patient Education  2021 Elsevier Inc.  

## 2020-05-16 NOTE — Addendum Note (Signed)
Addended by: Adam Phenix on: 05/16/2020 11:27 AM   Modules accepted: Orders

## 2020-05-16 NOTE — Progress Notes (Signed)
Patient ID: Sarah Evans, female   DOB: 1980-06-05, 40 y.o.   MRN: [id number]  Chief Complaint  Patient presents with  . Gynecologic Exam    HPI Sarah Evans is a 40 y.o. female.  G1P0100 Patient's last menstrual period was 05/02/2020 (exact date). 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 She currently has sx of genital outbreak for 3-4 days HPI  Past Medical History:  Diagnosis Date  . Bipolar 1 disorder (HCC)   . Herpes simplex virus (HSV) infection    dx 2001  . Polycystic ovary disease   . Schizophrenia [facility])     Past Surgical History:  Procedure Laterality Date  . NO PAST SURGERIES      Family History  Problem Relation Age of Onset  . Diabetes Maternal Grandmother   . Bipolar disorder Mother   . Hypertension Neg Hx   . Heart disease Neg Hx   . Cancer Neg Hx     Social History Social History   Tobacco Use  . Smoking status: Former Smoker    Packs/day: 0.25    Years: 15.00    Pack years: 3.75    Types: Cigarettes    Quit date: 05/02/2020    Years since quitting: 0.0  . Smokeless tobacco: Never Used  Vaping Use  . Vaping Use: Never used  Substance Use Topics  . Alcohol use: No    Comment: occ  . Drug use: No    No Known Allergies  Current Outpatient Medications  Medication Sig Dispense Refill  . atorvastatin (LIPITOR) 10 MG tablet Take 1 tablet (10 mg total) by mouth daily. 90 tablet 3  . busPIRone (BUSPAR) 15 MG tablet Take 1 tablet (15 mg total) by mouth 3 (three) times daily. 90 tablet 2  . clobetasol cream (TEMOVATE) 0.05 % Apply 1 application topically 2 (two) times daily. 30 g 0  . metFORMIN (GLUCOPHAGE) 500 MG tablet Take 1 tablet (500 mg total) by mouth daily with breakfast. 60 tablet 3  . naproxen (NAPROSYN) 500 MG tablet Take 1 tablet (500 mg total) by mouth 2 (two) times daily with a meal. 30 tablet 0  . norgestimate-ethinyl estradiol (MILI) 0.25-35 MG-MCG  tablet Take 1 tablet by mouth daily. 28 tablet 11  . pantoprazole (PROTONIX) 20 MG tablet Take 1 tablet (20 mg total) by mouth daily. 30 tablet 1  . polyethylene glycol powder (GLYCOLAX/MIRALAX) 17 GM/SCOOP powder Take 17 g by mouth 2 (two) times daily as needed. 507 g 1  . prazosin (MINIPRESS) 1 MG capsule Take 1 capsule (1 mg total) by mouth at bedtime. 30 capsule 1  . QUEtiapine (SEROQUEL) 100 MG tablet Take 1 tablet (100 mg total) by mouth at bedtime. 30 tablet 2  . QUEtiapine (SEROQUEL) 25 MG tablet Take 1 tablet (25 mg total) by mouth 2 (two) times daily. 60 tablet 2  . spironolactone (ALDACTONE) 100 MG tablet TAKE 1 TABLET BY MOUTH 2 TIMES DAILY 60 tablet 11  . tiZANidine (ZANAFLEX) 4 MG tablet Take 1 tablet (4 mg total) by mouth 3 (three) times daily as needed for muscle spasms. 90 tablet 0  . valACYclovir (VALTREX) 1000 MG tablet Take 1 tablet (1,000 mg total) by mouth daily. 5 tablet 2   No current facility-administered medications for this visit.    Review of Systems Review of Systems  Constitutional: Negative.   Gastrointestinal: Positive for abdominal distention, abdominal  pain, nausea and vomiting.  Genitourinary: Positive for genital sores.    Blood pressure 129/79, pulse 80, height 5\' 7"  (1.702 m), weight 189 lb 6.4 oz (85.9 kg), last menstrual period 05/02/2020.  Physical Exam Physical Exam Vitals and nursing note reviewed. Exam conducted with a chaperone present.  Constitutional:      Appearance: Normal appearance.  Cardiovascular:     Rate and Rhythm: Normal rate.  Pulmonary:     Effort: Pulmonary effort is normal.  Abdominal:     Palpations: Abdomen is soft. There is no mass.     Tenderness: There is no abdominal tenderness.  Genitourinary:      Comments: Small area of of minimal tenderness and irritation sample for testing obtained Neurological:     Mental Status: She is alert.  Psychiatric:        Mood and Affect: Mood normal.        Behavior: Behavior  normal.     Data Reviewed Office notes  Assessment Herpes simplex vulvovaginitis - Plan: valACYclovir (VALTREX) 1000 MG tablet  Nausea and vomiting, intractability of vomiting not specified, unspecified vomiting type - Plan: pantoprazole (PROTONIX) 20 MG tablet    Plan Recommend PCP f/u asap Valtrex Rx May need to see dermatology as some of her sx are not c/w genital HSV    05/04/2020 05/16/2020, 10:43 AM

## 2020-05-17 ENCOUNTER — Other Ambulatory Visit: Payer: Self-pay

## 2020-05-18 ENCOUNTER — Other Ambulatory Visit: Payer: Self-pay

## 2020-05-20 NOTE — Progress Notes (Signed)
Subjective:    Patient ID: Sarah Evans, female    DOB: [birth year], 39 y.o.   MRN: [id number] History of Present Illness: 41 y.o.F  PCP [place]    04/12/2020 This is a 40 year old female former patient of Dr. Reita Cliche seeking a new PCP and here as a work in visit.  This was a video visit which we accomplished using the video system and also my phone in the office for the audio component.  The patient states since 2017 she has had difficulty with pain in her legs all over it is worse in the night.  She is never been evaluated for restless leg syndrome and she has no history of iron deficiency.  She notes increased urine output increasing thirst increased polyaphasia.  Patient notes occasional nausea.  She also has bilateral lower leg rashes.  She works part-time as a good job as a Conservation officer, nature.  She has never had a sleep study but is interested in obtaining one.  She has a history of schizoaffective bipolar disorder and is on chronic medications including Seroquel buspirone for her mental health and takes praises and at night to help with sleep.  She still has sleep disruption.  She is on the Aldactone for polycystic ovary syndrome.  She is on 100 mg twice daily for this.  The patient used to be on Metformin but is been off this since 2017.  She does have hyperlipidemia and is interested in receiving statin therapy for this.  She has a mental health provider she follows with and she states currently she is having difficulty with increased nervousness anxiousness and lack of focus.  See review of systems. She went to the emergency room and was given Flexeril this does not help the muscle spasm  5/16 Patient comes in today wishing to establish further for primary care.  She has been seen previously by way of a video visit.  She is a former Dr. Meredeth Ide patient wishing to establish with me.  Patient has history of diabetes on a prediabetic basis now actually to type 2 diabetes fully established with an A1c of  6.  Patient is now on the metformin 500 mg daily she has lost 15 pounds in weight and is following healthy diet now exercising.  Patient also is taking the atorvastatin 10 mg daily.  Patient does need follow-up lab studies as she had mild renal insufficiency with on March 28 lab draw.  She is followed with her mental health provider for schizoaffective disorder bipolar subtype and is on BuSpar and Seroquel and prazosin.  Patient's rash in the lower extremity has improved with Temovate.  Patient is requesting medication for weight loss and we have previously discussed this is not a particular good idea given bipolar syndrome to receive a stimulant for weight loss. Patient would like a refill on the Valtrex she received from gynecology.  She does have periodic HSV outbreaks.  The patient also states her back muscle spasticity has resolved.  She still has periods of depression but does not have any suicidal ideation.  She does need nephropathy screening.  She has yet to receive the COVID vaccination.   Past Medical History:  Diagnosis Date  . Bipolar 1 disorder (HCC)   . Herpes simplex virus (HSV) infection    dx 2001  . Polycystic ovary disease   . Schizophrenia (HCC)      Family History  Problem Relation Age of Onset  . Diabetes Maternal Grandmother   . Bipolar disorder  Mother   . Hypertension Neg Hx   . Heart disease Neg Hx   . Cancer Neg Hx      Social History   Socioeconomic History  . Marital status: Single    Spouse name: Not on file  . Number of children: Not on file  . Years of education: Not on file  . Highest education level: Not on file  Occupational History  . Not on file  Tobacco Use  . Smoking status: Former Smoker    Packs/day: 0.25    Years: 15.00    Pack years: 3.75    Types: Cigarettes    Quit date: 05/02/2020    Years since quitting: 0.0  . Smokeless tobacco: Never Used  Vaping Use  . Vaping Use: Never used  Substance and Sexual Activity  .  Alcohol use: No    Comment: occ  . Drug use: No  . Sexual activity: Not Currently    Birth control/protection: Abstinence  Other Topics Concern  . Not on file  Social History Narrative  . Not on file   Social Determinants of Health   Financial Resource Strain: Not on file  Food Insecurity: Not on file  Transportation Needs: Not on file  Physical Activity: Not on file  Stress: Not on file  Social Connections: Not on file  Intimate Partner Violence: Not on file     No Known Allergies   Outpatient Medications Prior to Visit  Medication Sig Dispense Refill  . atorvastatin (LIPITOR) 10 MG tablet Take 1 tablet (10 mg total) by mouth daily. 90 tablet 3  . busPIRone (BUSPAR) 15 MG tablet Take 1 tablet (15 mg total) by mouth 3 (three) times daily. 90 tablet 2  . clobetasol cream (TEMOVATE) 0.05 % Apply 1 application topically 2 (two) times daily. 30 g 0  . metFORMIN (GLUCOPHAGE) 500 MG tablet Take 1 tablet (500 mg total) by mouth daily with breakfast. 60 tablet 3  . naproxen (NAPROSYN) 500 MG tablet Take 1 tablet (500 mg total) by mouth 2 (two) times daily with a meal. 30 tablet 0  . norgestimate-ethinyl estradiol (MILI) 0.25-35 MG-MCG tablet Take 1 tablet by mouth daily. 28 tablet 11  . pantoprazole (PROTONIX) 20 MG tablet Take 1 tablet (20 mg total) by mouth daily. 30 tablet 1  . polyethylene glycol powder (GLYCOLAX/MIRALAX) 17 GM/SCOOP powder Take 17 g by mouth 2 (two) times daily as needed. 507 g 1  . prazosin (MINIPRESS) 1 MG capsule Take 1 capsule (1 mg total) by mouth at bedtime. 30 capsule 1  . QUEtiapine (SEROQUEL) 100 MG tablet Take 1 tablet (100 mg total) by mouth at bedtime. 30 tablet 2  . QUEtiapine (SEROQUEL) 25 MG tablet Take 1 tablet (25 mg total) by mouth 2 (two) times daily. 60 tablet 2  . spironolactone (ALDACTONE) 100 MG tablet TAKE 1 TABLET BY MOUTH 2 TIMES DAILY 60 tablet 11  . tiZANidine (ZANAFLEX) 4 MG tablet Take 1 tablet (4 mg total) by mouth 3 (three) times  daily as needed for muscle spasms. 90 tablet 0  . valACYclovir (VALTREX) 1000 MG tablet Take 1 tablet (1,000 mg total) by mouth daily. 5 tablet 2   No facility-administered medications prior to visit.     Review of Systems  HENT: Negative.   Respiratory: Negative.   Cardiovascular: Negative for chest pain, palpitations and leg swelling.  Gastrointestinal: Positive for vomiting. Negative for constipation and nausea.  Endocrine: Negative for polydipsia, polyphagia and polyuria.  Genitourinary: Negative for  dysuria and flank pain.  Musculoskeletal: Positive for back pain.  Skin: Negative.   Neurological: Negative for weakness, light-headedness and headaches.  Psychiatric/Behavioral: Positive for dysphoric mood. Negative for decreased concentration, self-injury, sleep disturbance and suicidal ideas. The patient is nervous/anxious. The patient is not hyperactive.        Depression difficult.  Last two weeks  Ppt with [facility]        Objective:   Physical Exam Vitals:   05/21/20 0901  BP: 113/82  Pulse: 84  Resp: 16  Temp: 97.7 F (36.5 C)  SpO2: 97%  Weight: 187 lb 9.6 oz (85.1 kg)  Height: 5' 7.01" (1.702 m)    Gen: Pleasant, well-nourished, in no distress,  normal affect  ENT: No lesions,  mouth clear,  oropharynx clear, no postnasal drip  Neck: No JVD, no TMG, no carotid bruits  Lungs: No use of accessory muscles, no dullness to percussion, clear without rales or rhonchi  Cardiovascular: RRR, heart sounds normal, no murmur or gallops, no peripheral edema  Abdomen: soft and NT, no HSM,  BS normal  Musculoskeletal: No deformities, no cyanosis or clubbing  Neuro: alert, non focal  Skin: Warm, no lesions or rashes, rashes over both anterior lower legs have resolved Patient does not need a Pap smear repeated till 2025  Foot exam is normal    Assessment & Plan:  I personally reviewed all images and lab data in the [facility] system as well as any outside material available  during this office visit and agree with the  radiology impressions.   Controlled type 2 diabetes mellitus (HCC) Type 2 diabetes controlled A1c of 6.0  Check urine microalbumin this visit  Continue metformin daily 500 mg continue healthy diet and exercise  Hyperlipidemia associated with type 2 diabetes mellitus (HCC) Recheck lipid panel continue atorvastatin  Intrinsic eczema Eczema resolved continue Temovate as needed  Schizoaffective disorder, bipolar type (HCC) Management per psychiatry  Muscle spasticity Muscle spasticity resolved continue Zanaflex  as needed  BMI 29.0-29.9,adult Mild elevation in BMI patient is already lost 15 pounds  I encouraged her to continue her current lifestyle changes  Patient requested weight loss medication I told her this was not indicated and was high risk given her bipolar condition  HSV (herpes simplex virus) anogenital infection Recurrent HSV anogenital infection  Refills on Valtrex given   Shalene was seen today for annual exam.  Diagnoses and all orders for this visit:  Hyperlipidemia associated with type 2 diabetes mellitus (HCC) -     Lipid panel  Uncontrolled type 2 diabetes mellitus with hyperglycemia (HCC) -     Basic Metabolic Panel -     Microalbumin / creatinine urine ratio  Herpes simplex vulvovaginitis -     valACYclovir (VALTREX) 1000 MG tablet; Take 1 tablet (1,000 mg total) by mouth daily.  Schizoaffective disorder, bipolar type (HCC)  Controlled type 2 diabetes mellitus without complication, without long-term current use of insulin (HCC)  Intrinsic eczema  Muscle spasticity  BMI 29.0-29.9,adult  HSV (herpes simplex virus) anogenital infection   I spent 36 minutes going over the patient's chart prescribing medication performing exam giving counseling complex decision making

## 2020-05-21 ENCOUNTER — Ambulatory Visit (HOSPITAL_COMMUNITY): Payer: Self-pay | Admitting: Licensed Clinical Social Worker

## 2020-05-21 ENCOUNTER — Other Ambulatory Visit: Payer: Self-pay

## 2020-05-21 ENCOUNTER — Telehealth (HOSPITAL_COMMUNITY): Payer: Self-pay | Admitting: Licensed Clinical Social Worker

## 2020-05-21 ENCOUNTER — Encounter: Payer: Self-pay | Admitting: Critical Care Medicine

## 2020-05-21 ENCOUNTER — Ambulatory Visit: Payer: Self-pay | Attending: Critical Care Medicine | Admitting: Critical Care Medicine

## 2020-05-21 VITALS — BP 113/82 | HR 84 | Temp 97.7°F | Resp 16 | Ht 67.01 in | Wt 187.6 lb

## 2020-05-21 DIAGNOSIS — E1169 Type 2 diabetes mellitus with other specified complication: Secondary | ICD-10-CM

## 2020-05-21 DIAGNOSIS — Z7984 Long term (current) use of oral hypoglycemic drugs: Secondary | ICD-10-CM | POA: Insufficient documentation

## 2020-05-21 DIAGNOSIS — A6004 Herpesviral vulvovaginitis: Secondary | ICD-10-CM

## 2020-05-21 DIAGNOSIS — L2084 Intrinsic (allergic) eczema: Secondary | ICD-10-CM

## 2020-05-21 DIAGNOSIS — M79605 Pain in left leg: Secondary | ICD-10-CM | POA: Insufficient documentation

## 2020-05-21 DIAGNOSIS — Z6829 Body mass index (BMI) 29.0-29.9, adult: Secondary | ICD-10-CM

## 2020-05-21 DIAGNOSIS — M79604 Pain in right leg: Secondary | ICD-10-CM | POA: Insufficient documentation

## 2020-05-21 DIAGNOSIS — E1165 Type 2 diabetes mellitus with hyperglycemia: Secondary | ICD-10-CM

## 2020-05-21 DIAGNOSIS — Z28310 Unvaccinated for covid-19: Secondary | ICD-10-CM | POA: Insufficient documentation

## 2020-05-21 DIAGNOSIS — Z79899 Other long term (current) drug therapy: Secondary | ICD-10-CM | POA: Insufficient documentation

## 2020-05-21 DIAGNOSIS — Z791 Long term (current) use of non-steroidal anti-inflammatories (NSAID): Secondary | ICD-10-CM | POA: Insufficient documentation

## 2020-05-21 DIAGNOSIS — Z0000 Encounter for general adult medical examination without abnormal findings: Secondary | ICD-10-CM | POA: Insufficient documentation

## 2020-05-21 DIAGNOSIS — Z87891 Personal history of nicotine dependence: Secondary | ICD-10-CM | POA: Insufficient documentation

## 2020-05-21 DIAGNOSIS — E119 Type 2 diabetes mellitus without complications: Secondary | ICD-10-CM

## 2020-05-21 DIAGNOSIS — F250 Schizoaffective disorder, bipolar type: Secondary | ICD-10-CM

## 2020-05-21 DIAGNOSIS — E282 Polycystic ovarian syndrome: Secondary | ICD-10-CM | POA: Insufficient documentation

## 2020-05-21 DIAGNOSIS — A609 Anogenital herpesviral infection, unspecified: Secondary | ICD-10-CM

## 2020-05-21 DIAGNOSIS — Z793 Long term (current) use of hormonal contraceptives: Secondary | ICD-10-CM | POA: Insufficient documentation

## 2020-05-21 DIAGNOSIS — M62838 Other muscle spasm: Secondary | ICD-10-CM

## 2020-05-21 DIAGNOSIS — E785 Hyperlipidemia, unspecified: Secondary | ICD-10-CM | POA: Insufficient documentation

## 2020-05-21 MED ORDER — VALACYCLOVIR HCL 1 G PO TABS
1000.0000 mg | ORAL_TABLET | Freq: Every day | ORAL | 2 refills | Status: DC
  Filled 2020-05-21: qty 60, 60d supply, fill #0
  Filled 2020-06-18: qty 30, 30d supply, fill #0
  Filled 2020-07-10: qty 60, 60d supply, fill #1
  Filled 2021-04-23 – 2021-05-10 (×2): qty 30, 30d supply, fill #0

## 2020-05-21 NOTE — Patient Instructions (Signed)
Urine for microalbumin will be obtained today along with cholesterol panel and electrolytes and kidney function stop by the lab on the way out  Refills on Valtrex sent to our pharmacy  No change in other medications  Continue to follow a healthy diet and lifestyle modifications you have done  Return to see Dr. Delford Field in 2 months

## 2020-05-21 NOTE — Telephone Encounter (Signed)
LCSW sent text link for video session per schedule. LCSW remained avail online unitl 2:10. Pt failed to join session.

## 2020-05-21 NOTE — Assessment & Plan Note (Signed)
Mild elevation in BMI patient is already lost 15 pounds  I encouraged her to continue her current lifestyle changes  Patient requested weight loss medication I told her this was not indicated and was high risk given her bipolar condition

## 2020-05-21 NOTE — Assessment & Plan Note (Signed)
Type 2 diabetes controlled A1c of 6.0  Check urine microalbumin this visit  Continue metformin daily 500 mg continue healthy diet and exercise

## 2020-05-21 NOTE — Assessment & Plan Note (Signed)
Eczema resolved continue Temovate as needed

## 2020-05-21 NOTE — Assessment & Plan Note (Signed)
Recheck lipid panel continue atorvastatin 

## 2020-05-21 NOTE — Assessment & Plan Note (Signed)
Muscle spasticity resolved continue Zanaflex  as needed

## 2020-05-21 NOTE — Assessment & Plan Note (Signed)
Recurrent HSV anogenital infection  Refills on Valtrex given

## 2020-05-21 NOTE — Assessment & Plan Note (Signed)
Management per psychiatry 

## 2020-05-21 NOTE — Progress Notes (Signed)
Physical Weight loss options

## 2020-05-22 LAB — BASIC METABOLIC PANEL
BUN/Creatinine Ratio: 8 — ABNORMAL LOW (ref 9–23)
BUN: 9 mg/dL (ref 6–20)
CO2: 18 mmol/L — ABNORMAL LOW (ref 20–29)
Calcium: 9.1 mg/dL (ref 8.7–10.2)
Chloride: 105 mmol/L (ref 96–106)
Creatinine, Ser: 1.07 mg/dL — ABNORMAL HIGH (ref 0.57–1.00)
Glucose: 86 mg/dL (ref 65–99)
Potassium: 4.4 mmol/L (ref 3.5–5.2)
Sodium: 139 mmol/L (ref 134–144)
eGFR: 68 mL/min/{1.73_m2} (ref >59)

## 2020-05-22 LAB — LIPID PANEL
Chol/HDL Ratio: 2.7 ratio (ref 0.0–4.4)
Cholesterol, Total: 160 mg/dL (ref 100–199)
HDL: 59 mg/dL (ref >39)
LDL Chol Calc (NIH): 78 mg/dL (ref 0–99)
Triglycerides: 135 mg/dL (ref 0–149)
VLDL Cholesterol Cal: 23 mg/dL (ref 5–40)

## 2020-05-22 LAB — MICROALBUMIN / CREATININE URINE RATIO
Creatinine, Urine: 284.9 mg/dL
Microalb/Creat Ratio: 2 mg/g creat (ref 0–29)
Microalbumin, Urine: 4.9 ug/mL

## 2020-05-23 ENCOUNTER — Emergency Department (HOSPITAL_COMMUNITY): Payer: Self-pay

## 2020-05-23 ENCOUNTER — Encounter (HOSPITAL_COMMUNITY): Payer: Self-pay

## 2020-05-23 ENCOUNTER — Inpatient Hospital Stay (HOSPITAL_COMMUNITY)
Admission: EM | Admit: 2020-05-23 | Discharge: 2020-05-25 | DRG: 066 | Disposition: A | Discharge status: Home / Self Care | Payer: Self-pay | Attending: Internal Medicine | Admitting: Internal Medicine

## 2020-05-23 DIAGNOSIS — R29702 NIHSS score 2: Secondary | ICD-10-CM | POA: Diagnosis not present

## 2020-05-23 DIAGNOSIS — R200 Anesthesia of skin: Secondary | ICD-10-CM | POA: Diagnosis present

## 2020-05-23 DIAGNOSIS — F250 Schizoaffective disorder, bipolar type: Secondary | ICD-10-CM | POA: Diagnosis present

## 2020-05-23 DIAGNOSIS — Z8673 Personal history of transient ischemic attack (TIA), and cerebral infarction without residual deficits: Secondary | ICD-10-CM | POA: Diagnosis present

## 2020-05-23 DIAGNOSIS — I639 Cerebral infarction, unspecified: Secondary | ICD-10-CM | POA: Diagnosis present

## 2020-05-23 DIAGNOSIS — R29700 NIHSS score 0: Secondary | ICD-10-CM | POA: Diagnosis not present

## 2020-05-23 DIAGNOSIS — I63411 Cerebral infarction due to embolism of right middle cerebral artery: Principal | ICD-10-CM | POA: Diagnosis present

## 2020-05-23 DIAGNOSIS — Z20822 Contact with and (suspected) exposure to covid-19: Secondary | ICD-10-CM | POA: Diagnosis present

## 2020-05-23 DIAGNOSIS — Z793 Long term (current) use of hormonal contraceptives: Secondary | ICD-10-CM

## 2020-05-23 DIAGNOSIS — E785 Hyperlipidemia, unspecified: Secondary | ICD-10-CM

## 2020-05-23 DIAGNOSIS — R7303 Prediabetes: Secondary | ICD-10-CM

## 2020-05-23 DIAGNOSIS — A6009 Herpesviral infection of other urogenital tract: Secondary | ICD-10-CM | POA: Diagnosis present

## 2020-05-23 DIAGNOSIS — E1169 Type 2 diabetes mellitus with other specified complication: Secondary | ICD-10-CM | POA: Diagnosis present

## 2020-05-23 DIAGNOSIS — Z833 Family history of diabetes mellitus: Secondary | ICD-10-CM

## 2020-05-23 DIAGNOSIS — G8324 Monoplegia of upper limb affecting left nondominant side: Secondary | ICD-10-CM | POA: Diagnosis present

## 2020-05-23 DIAGNOSIS — E119 Type 2 diabetes mellitus without complications: Secondary | ICD-10-CM

## 2020-05-23 DIAGNOSIS — Z818 Family history of other mental and behavioral disorders: Secondary | ICD-10-CM

## 2020-05-23 DIAGNOSIS — Z87891 Personal history of nicotine dependence: Secondary | ICD-10-CM

## 2020-05-23 DIAGNOSIS — R233 Spontaneous ecchymoses: Secondary | ICD-10-CM | POA: Diagnosis present

## 2020-05-23 DIAGNOSIS — E782 Mixed hyperlipidemia: Secondary | ICD-10-CM | POA: Diagnosis present

## 2020-05-23 DIAGNOSIS — F419 Anxiety disorder, unspecified: Secondary | ICD-10-CM | POA: Diagnosis present

## 2020-05-23 DIAGNOSIS — Z7984 Long term (current) use of oral hypoglycemic drugs: Secondary | ICD-10-CM

## 2020-05-23 DIAGNOSIS — Z79899 Other long term (current) drug therapy: Secondary | ICD-10-CM

## 2020-05-23 DIAGNOSIS — E282 Polycystic ovarian syndrome: Secondary | ICD-10-CM | POA: Diagnosis present

## 2020-05-23 DIAGNOSIS — F4310 Post-traumatic stress disorder, unspecified: Secondary | ICD-10-CM | POA: Diagnosis present

## 2020-05-23 LAB — COMPREHENSIVE METABOLIC PANEL
ALT: 35 U/L (ref 0–44)
AST: 21 U/L (ref 15–41)
Albumin: 3.6 g/dL (ref 3.5–5.0)
Alkaline Phosphatase: 65 U/L (ref 38–126)
Anion gap: 10 (ref 5–15)
BUN: 13 mg/dL (ref 6–20)
CO2: 22 mmol/L (ref 22–32)
Calcium: 9.2 mg/dL (ref 8.9–10.3)
Chloride: 108 mmol/L (ref 98–111)
Creatinine, Ser: 0.86 mg/dL (ref 0.44–1.00)
GFR, Estimated: >60 mL/min (ref >60)
Glucose, Bld: 93 mg/dL (ref 70–99)
Potassium: 3.7 mmol/L (ref 3.5–5.1)
Sodium: 140 mmol/L (ref 135–145)
Total Bilirubin: 0.6 mg/dL (ref 0.3–1.2)
Total Protein: 7.1 g/dL (ref 6.5–8.1)

## 2020-05-23 LAB — URINALYSIS, ROUTINE W REFLEX MICROSCOPIC
Bacteria, UA: NONE SEEN
Bilirubin Urine: NEGATIVE
Glucose, UA: NEGATIVE mg/dL
Ketones, ur: 20 mg/dL — AB
Leukocytes,Ua: NEGATIVE
Nitrite: NEGATIVE
Protein, ur: NEGATIVE mg/dL
Specific Gravity, Urine: 1.018 (ref 1.005–1.030)
pH: 6 (ref 5.0–8.0)

## 2020-05-23 LAB — RAPID URINE DRUG SCREEN, HOSP PERFORMED
Amphetamines: NOT DETECTED
Barbiturates: NOT DETECTED
Benzodiazepines: NOT DETECTED
Cocaine: NOT DETECTED
Opiates: NOT DETECTED
Tetrahydrocannabinol: POSITIVE — AB

## 2020-05-23 LAB — CBC
HCT: 41.9 % (ref 36.0–46.0)
Hemoglobin: 13.5 g/dL (ref 12.0–15.0)
MCH: 27.9 pg (ref 26.0–34.0)
MCHC: 32.2 g/dL (ref 30.0–36.0)
MCV: 86.6 fL (ref 80.0–100.0)
Platelets: 332 10*3/uL (ref 150–400)
RBC: 4.84 MIL/uL (ref 3.87–5.11)
RDW: 14.4 % (ref 11.5–15.5)
WBC: 7.8 10*3/uL (ref 4.0–10.5)
nRBC: 0 % (ref 0.0–0.2)

## 2020-05-23 LAB — PROTIME-INR
INR: 1 (ref 0.8–1.2)
Prothrombin Time: 13 seconds (ref 11.4–15.2)

## 2020-05-23 LAB — DIFFERENTIAL
Abs Immature Granulocytes: 0.03 10*3/uL (ref 0.00–0.07)
Basophils Absolute: 0 10*3/uL (ref 0.0–0.1)
Basophils Relative: 1 %
Eosinophils Absolute: 0.1 10*3/uL (ref 0.0–0.5)
Eosinophils Relative: 1 %
Immature Granulocytes: 0 %
Lymphocytes Relative: 23 %
Lymphs Abs: 1.8 10*3/uL (ref 0.7–4.0)
Monocytes Absolute: 0.6 10*3/uL (ref 0.1–1.0)
Monocytes Relative: 8 %
Neutro Abs: 5.3 10*3/uL (ref 1.7–7.7)
Neutrophils Relative %: 67 %

## 2020-05-23 LAB — HSV DNA BY PCR (REFERENCE LAB)
HSV 2 DNA: POSITIVE — AB
HSV-1 DNA: NEGATIVE

## 2020-05-23 LAB — I-STAT BETA HCG BLOOD, ED (MC, WL, AP ONLY): I-stat hCG, quantitative: <5 m[IU]/mL (ref <5)

## 2020-05-23 LAB — APTT: aPTT: 25 seconds (ref 24–36)

## 2020-05-23 MED ORDER — VALACYCLOVIR HCL 500 MG PO TABS
1000.0000 mg | ORAL_TABLET | Freq: Every day | ORAL | Status: DC
  Administered 2020-05-24 – 2020-05-25 (×2): 1000 mg via ORAL
  Filled 2020-05-23 (×2): qty 2

## 2020-05-23 MED ORDER — QUETIAPINE FUMARATE 25 MG PO TABS
25.0000 mg | ORAL_TABLET | Freq: Two times a day (BID) | ORAL | Status: DC
  Administered 2020-05-24 – 2020-05-25 (×2): 25 mg via ORAL
  Filled 2020-05-23 (×3): qty 1

## 2020-05-23 MED ORDER — ACETAMINOPHEN 650 MG RE SUPP: 650.0000 mg | RECTAL | Status: DC | PRN

## 2020-05-23 MED ORDER — CYCLOBENZAPRINE HCL 10 MG PO TABS
10.0000 mg | ORAL_TABLET | Freq: Every day | ORAL | Status: DC | PRN
  Administered 2020-05-24 – 2020-05-25 (×2): 10 mg via ORAL
  Filled 2020-05-23 (×2): qty 1

## 2020-05-23 MED ORDER — PRAZOSIN HCL 1 MG PO CAPS
1.0000 mg | ORAL_CAPSULE | Freq: Every evening | ORAL | Status: DC | PRN
  Filled 2020-05-23: qty 1

## 2020-05-23 MED ORDER — SODIUM CHLORIDE 0.9 % IV SOLN: INTRAVENOUS | Status: DC

## 2020-05-23 MED ORDER — ACETAMINOPHEN 160 MG/5ML PO SOLN: 650.0000 mg | ORAL | Status: DC | PRN

## 2020-05-23 MED ORDER — BUSPIRONE HCL 10 MG PO TABS
15.0000 mg | ORAL_TABLET | Freq: Three times a day (TID) | ORAL | Status: DC
  Administered 2020-05-23 – 2020-05-25 (×5): 15 mg via ORAL
  Filled 2020-05-23 (×5): qty 2

## 2020-05-23 MED ORDER — SODIUM CHLORIDE 0.9 % IV SOLN: 100.0000 mL/h | INTRAVENOUS | Status: DC

## 2020-05-23 MED ORDER — POLYETHYLENE GLYCOL 3350 17 G PO PACK: 17.0000 g | PACK | Freq: Two times a day (BID) | ORAL | Status: DC | PRN

## 2020-05-23 MED ORDER — LORAZEPAM 2 MG/ML IJ SOLN: 1.0000 mg | Freq: Once | INTRAMUSCULAR | Status: DC | PRN

## 2020-05-23 MED ORDER — ACETAMINOPHEN 325 MG PO TABS
650.0000 mg | ORAL_TABLET | ORAL | Status: DC | PRN
  Administered 2020-05-24 – 2020-05-25 (×3): 650 mg via ORAL
  Filled 2020-05-23 (×3): qty 2

## 2020-05-23 MED ORDER — INSULIN ASPART 100 UNIT/ML IJ SOLN
0.0000 [IU] | Freq: Three times a day (TID) | INTRAMUSCULAR | Status: DC
  Filled 2020-05-23: qty 0.09

## 2020-05-23 MED ORDER — ATORVASTATIN CALCIUM 10 MG PO TABS: 10.0000 mg | ORAL_TABLET | Freq: Every day | ORAL | Status: DC

## 2020-05-23 MED ORDER — QUETIAPINE FUMARATE 100 MG PO TABS
100.0000 mg | ORAL_TABLET | Freq: Every day | ORAL | Status: DC
  Administered 2020-05-23 – 2020-05-24 (×2): 100 mg via ORAL
  Filled 2020-05-23 (×2): qty 1

## 2020-05-23 MED ORDER — GADOBUTROL 1 MMOL/ML IV SOLN
8.5000 mL | Freq: Once | INTRAVENOUS | Status: AC | PRN
  Administered 2020-05-23: 8.5 mL via INTRAVENOUS

## 2020-05-23 MED ORDER — SODIUM CHLORIDE 0.9 % IV BOLUS
500.0000 mL | Freq: Once | INTRAVENOUS | Status: AC
  Administered 2020-05-23: 500 mL via INTRAVENOUS

## 2020-05-23 MED ORDER — SENNOSIDES-DOCUSATE SODIUM 8.6-50 MG PO TABS: 1.0000 | ORAL_TABLET | Freq: Every evening | ORAL | Status: DC | PRN

## 2020-05-23 MED ORDER — LORAZEPAM 2 MG/ML IJ SOLN
1.0000 mg | Freq: Once | INTRAMUSCULAR | Status: AC | PRN
  Administered 2020-05-23: 1 mg via INTRAVENOUS
  Filled 2020-05-23: qty 1

## 2020-05-23 MED ORDER — STROKE: EARLY STAGES OF RECOVERY BOOK
Freq: Once | Status: AC
  Filled 2020-05-23: qty 1

## 2020-05-23 MED ORDER — PANTOPRAZOLE SODIUM 20 MG PO TBEC
20.0000 mg | DELAYED_RELEASE_TABLET | Freq: Every day | ORAL | Status: DC
  Administered 2020-05-24 – 2020-05-25 (×2): 20 mg via ORAL
  Filled 2020-05-23 (×2): qty 1

## 2020-05-23 NOTE — ED Notes (Signed)
Patient co having numbness in her left hand- moves the hand slowly but reports numbness in the center of her left palm.  Denies any type of injury, fingers/hand warm to touch. Strong radial pulses

## 2020-05-23 NOTE — ED Notes (Signed)
Pt is aware that urine sample is needed. 

## 2020-05-23 NOTE — ED Notes (Signed)
Patient is off the floor for MRI.

## 2020-05-23 NOTE — ED Notes (Signed)
Patient remains in MRI Dept

## 2020-05-23 NOTE — ED Notes (Signed)
MD at bedside. 

## 2020-05-23 NOTE — H&P (Signed)
History and Physical    EVANNA WASHINTON ACZ:[id number] DOB: [birth year] DOA: 05/23/2020  PCP: Storm Frisk, MD   Patient coming from: Home   Chief Complaint: Left hand weakness   HPI: LINNEA TODISCO is a 40 y.o. female with medical history significant for schizoaffective disorder, polycystic ovary syndrome, type 2 diabetes mellitus, and hyperlipidemia, now presenting to the emergency department with left hand weakness.  Patient reports developing weakness involving the left hand at approximately 11 AM or noon yesterday.  She denies any headache, change in vision or hearing, chest pain or palpitations, numbness or tingling, or any other weakness.  She has never experienced this previously.  Denies any recent fall or trauma.  Denies any fever or chills.  ED Course: Upon arrival to the ED, patient is found to be afebrile, saturating well on room air, and with stable blood pressure.  Chemistry panel and CBC were unremarkable.  CT head was notable for an abnormal area of mixed density in the right middle frontal gyrus and this was followed with MRI brain that reveals multiple lesions within the right hemisphere most consistent with subacute infarcts and associated petechial hemorrhage.  Neurology was consulted by the ED physician and hospitalists asked to admit.  Review of Systems:  All other systems reviewed and apart from HPI, are negative.  Past Medical History:  Diagnosis Date  . Bipolar 1 disorder (HCC)   . Herpes simplex virus (HSV) infection    dx 2001  . Polycystic ovary disease   . Schizophrenia [facility])     Past Surgical History:  Procedure Laterality Date  . NO PAST SURGERIES      Social History:   reports that she quit smoking about 3 weeks ago. Her smoking use included cigarettes. She has a 3.75 pack-year smoking history. She has never used smokeless tobacco. She reports that she does not drink alcohol and does not use drugs.  No Known Allergies  Family History  Problem  Relation Age of Onset  . Diabetes Maternal Grandmother   . Bipolar disorder Mother   . Hypertension Neg Hx   . Heart disease Neg Hx   . Cancer Neg Hx      Prior to Admission medications   Medication Sig Start Date End Date Taking? Authorizing Provider  atorvastatin (LIPITOR) 10 MG tablet Take 1 tablet (10 mg total) by mouth daily. 04/12/20  Yes Storm Frisk, MD  busPIRone (BUSPAR) 15 MG tablet Take 1 tablet (15 mg total) by mouth 3 (three) times daily. 12/06/19  Yes Toy Cookey E, NP  clobetasol cream (TEMOVATE) 0.05 % Apply 1 application topically 2 (two) times daily. 04/12/20  Yes Storm Frisk, MD  cyclobenzaprine (FLEXERIL) 10 MG tablet Take 10 mg by mouth daily as needed for muscle spasms.   Yes [provider]  metFORMIN (GLUCOPHAGE) 500 MG tablet Take 1 tablet (500 mg total) by mouth daily with breakfast. 04/12/20  Yes Storm Frisk, MD  Multiple Vitamin (MULTIVITAMIN WITH MINERALS) TABS tablet Take 1 tablet by mouth daily.   Yes [provider]  norgestimate-ethinyl estradiol (MILI) 0.25-35 MG-MCG tablet Take 1 tablet by mouth daily. 04/02/20  Yes Alric Seton, MD  pantoprazole (PROTONIX) 20 MG tablet Take 1 tablet (20 mg total) by mouth daily. 05/16/20  Yes Adam Phenix, MD  polyethylene glycol powder (GLYCOLAX/MIRALAX) 17 GM/SCOOP powder Take 17 g by mouth 2 (two) times daily as needed. Patient taking differently: Take 17 g by mouth 2 (two) times  daily as needed for mild constipation. 04/14/20  Yes Storm FriskWright, Patrick E, MD  prazosin (MINIPRESS) 1 MG capsule Take 1 capsule (1 mg total) by mouth at bedtime. Patient taking differently: Take 1 mg by mouth at bedtime as needed (nightmares). 12/06/19  Yes Toy CookeyParsons, Brittney E, NP  QUEtiapine (SEROQUEL) 100 MG tablet Take 1 tablet (100 mg total) by mouth at bedtime. 12/06/19  Yes Toy CookeyParsons, Brittney E, NP  QUEtiapine (SEROQUEL) 25 MG tablet Take 1 tablet (25 mg total) by mouth 2 (two) times daily. 12/06/19   Yes Toy CookeyParsons, Brittney E, NP  spironolactone (ALDACTONE) 100 MG tablet TAKE 1 TABLET BY MOUTH 2 TIMES DAILY Patient taking differently: Take 100 mg by mouth 2 (two) times daily. 04/02/20 04/02/21 Yes Alric SetonFirestone, Alicia C, MD  valACYclovir (VALTREX) 1000 MG tablet Take 1 tablet (1,000 mg total) by mouth daily. 05/21/20  Yes Storm FriskWright, Patrick E, MD  tiZANidine (ZANAFLEX) 4 MG tablet Take 1 tablet (4 mg total) by mouth 3 (three) times daily as needed for muscle spasms. Patient not taking: No sig reported 04/12/20   Storm FriskWright, Patrick E, MD    Physical Exam: Vitals:   05/23/20 1715 05/23/20 1730 05/23/20 1918 05/23/20 2000  BP: 109/65 116/66 122/80 120/79  Pulse: 67 73 70 74  Resp: 19 20 15 17   Temp:      TempSrc:      SpO2: 99% 100% 99% 99%  Weight:      Height:        Constitutional: NAD, calm  Eyes: PERTLA, lids and conjunctivae normal ENMT: Mucous membranes are moist. Posterior pharynx clear of any exudate or lesions.   Neck: supple, no masses  Respiratory:  no wheezing, no crackles. No accessory muscle use.  Cardiovascular: S1 & S2 heard, regular rate and rhythm. No extremity edema. No significant JVD. Abdomen: No distension, no tenderness, soft. Bowel sounds active.  Musculoskeletal: no clubbing / cyanosis. No joint deformity upper and lower extremities.   Skin: no significant rashes, lesions, ulcers. Warm, dry, well-perfused. Neurologic: CN 2-12 grossly intact. Sensation intact. Strength 4/5 in left grip, otherwise 5/5 all 4 limbs.  Psychiatric: Alert and oriented to person, place, and situation. Pleasant and cooperative.    Labs and Imaging on Admission: I have personally reviewed following labs and imaging studies  CBC: Recent Labs  Lab 05/23/20 0833  WBC 7.8  NEUTROABS 5.3  HGB 13.5  HCT 41.9  MCV 86.6  PLT 332   Basic Metabolic Panel: Recent Labs  Lab 05/21/20 0931 05/23/20 0833  NA 139 140  K 4.4 3.7  CL 105 108  CO2 18* 22  GLUCOSE 86 93  BUN 9 13  CREATININE  1.07* 0.86  CALCIUM 9.1 9.2   GFR: Estimated Creatinine Clearance: 99.3 mL/min (by C-G formula based on SCr of 0.86 mg/dL). Liver Function Tests: Recent Labs  Lab 05/23/20 0833  AST 21  ALT 35  ALKPHOS 65  BILITOT 0.6  PROT 7.1  ALBUMIN 3.6   No results for input(s): LIPASE, AMYLASE in the last 168 hours. No results for input(s): AMMONIA in the last 168 hours. Coagulation Profile: Recent Labs  Lab 05/23/20 0833  INR 1.0   Cardiac Enzymes: No results for input(s): CKTOTAL, CKMB, CKMBINDEX, TROPONINI in the last 168 hours. BNP (last 3 results) No results for input(s): PROBNP in the last 8760 hours. HbA1C: No results for input(s): HGBA1C in the last 72 hours. CBG: No results for input(s): GLUCAP in the last 168 hours. Lipid Profile: Recent Labs  05/21/20 0931  CHOL 160  HDL 59  LDLCALC 78  TRIG 135  CHOLHDL 2.7   Thyroid Function Tests: No results for input(s): TSH, T4TOTAL, FREET4, T3FREE, THYROIDAB in the last 72 hours. Anemia Panel: No results for input(s): VITAMINB12, FOLATE, FERRITIN, TIBC, IRON, RETICCTPCT in the last 72 hours. Urine analysis:    Component Value Date/Time   COLORURINE YELLOW 05/23/2020 1126   APPEARANCEUR HAZY (A) 05/23/2020 1126   LABSPEC 1.018 05/23/2020 1126   PHURINE 6.0 05/23/2020 1126   GLUCOSEU NEGATIVE 05/23/2020 1126   HGBUR MODERATE (A) 05/23/2020 1126   BILIRUBINUR NEGATIVE 05/23/2020 1126   KETONESUR 20 (A) 05/23/2020 1126   PROTEINUR NEGATIVE 05/23/2020 1126   NITRITE NEGATIVE 05/23/2020 1126   LEUKOCYTESUR NEGATIVE 05/23/2020 1126   Sepsis Labs: @LABRCNTIP (procalcitonin:4,lacticidven:4) )No results found for this or any previous visit (from the past 240 hour(s)).   Radiological Exams on Admission: CT HEAD WO CONTRAST  Result Date: 05/23/2020 CLINICAL DATA:  40 year old female with dizziness and left hand weakness since yesterday. EXAM: CT HEAD WITHOUT CONTRAST TECHNIQUE: Contiguous axial images were obtained from  the base of the skull through the vertex without intravenous contrast. COMPARISON:  None. FINDINGS: Brain: Abnormal roughly 3 cm area of loss gray-white matter differentiation in the anterior right middle frontal gyrus near the frontal operculum (series 2, image 18 and series 5, image 22). Heterogeneous density there raising the possibility of petechial hemorrhage. No significant mass effect. Elsewhere gray-white matter differentiation appears to be normal. No midline shift, ventriculomegaly. No other acute or chronic cortically based infarct. Vascular: No suspicious intracranial vascular hyperdensity. Skull: Negative. Sinuses/Orbits: Visualized paranasal sinuses and mastoids are clear. Tympanic cavities appear clear. Other: Visualized orbits and scalp soft tissues are within normal limits. IMPRESSION: Abnormal 3 cm area of mixed density in the right middle frontal gyrus. The noncontrast CT appearance most resembles a recent infarct with petechial hemorrhage. No significant mass effect. But other differential considerations include tumor and sequelae of a vascular malformation. Recommend Brain MRI without and with contrast to further characterize. Electronically Signed   By: 20 M.D.   On: 05/23/2020 09:37   MR Brain W and Wo Contrast  Result Date: 05/23/2020 CLINICAL DATA:  Abnormal head CT.  Left upper extremity weakness. EXAM: MRI HEAD WITHOUT AND WITH CONTRAST TECHNIQUE: Multiplanar, multiecho pulse sequences of the brain and surrounding structures were obtained without and with intravenous contrast. CONTRAST:  8.32mL GADAVIST GADOBUTROL 1 MMOL/ML IV SOLN COMPARISON:  None. FINDINGS: Brain: There is multifocal abnormal diffusion restriction within the right hemisphere, affecting the right middle and precentral gyri and the posterior right parietal lobe and right occipital lobe. Petechial blood products associated with the right frontal lesion. There is abnormal leptomeningeal contrast enhancement  associated with the diffusion restricting lesions. Mild edema at the lesion sites but otherwise normal white matter signal. The midline structures are normal. Vascular: Major flow voids are preserved. Skull and upper cervical spine: Normal calvarium and skull base. Visualized upper cervical spine and soft tissues are normal. Sinuses/Orbits:No paranasal sinus fluid levels or advanced mucosal thickening. No mastoid or middle ear effusion. Normal orbits. IMPRESSION: 1. Multiple diffusion restricting cortical/subcortical lesions within the right hemisphere with overlying leptomeningeal contrast enhancement, most consistent with subacute infarcts. One of the lesions directly affects the hand region of the motor strip. 2. Petechial blood products associated with the right frontal lesion site. Heidelberg classification 1a: HI1, scattered small petechiae, no mass effect. Electronically Signed   By: 4m.D.  On: 05/23/2020 19:22    EKG: Independently reviewed. Sinus rhythm.   Assessment/Plan   1. Ischemic CVA  - Presents with left hand weakness since ~noon on 05/22/20 and is found to have multiple cortical/subcortical lesions within the right hemisphere concerning for subacute infarcts with scattered petechiae  - Neurology consulting and much appreciated  - Continue neuro checks, cardiac monitoring, NPO pending swallow eval  - Check MRA head and neck, echocardiogram, lipids, and A1c  - Follow-up neurology recommendations   2. Schizoaffective disorder  - Appears stable  - Continue Seroquel, Buspar, and Prazosin   3. Type II DM  - Update A1c, check CBGs, use low-intensity SSI    4. Hyperlipidemia  - Check lipid panel in am, continue Lipitor 10 mg for now    DVT prophylaxis: SCDs  Code Status: Full  Level of Care: Level of care: Telemetry Medical Family Communication: none present  Disposition Plan:  Patient is from: home  Anticipated d/c is to: Home  Anticipated d/c date is: 5/19 or  05/25/20 Patient currently: Pending further imaging, neurology consultation, therapy assessments   Consults called: Neurology  Admission status: Observation     Briscoe Deutscher, MD [facility]  05/23/2020, 10:33 PM

## 2020-05-23 NOTE — ED Notes (Signed)
Pt back from MRI. Pt requesting to eat. Dr. Estell Harpin notified to see if pt can have diet order

## 2020-05-23 NOTE — ED Provider Notes (Signed)
MRI results did show that the patient had subacute infarct.  I spoke with the neurologist Dr. Iver Nestle.  She stated that the patient needs to be admitted over at [facility] under the hospitalist service and neurology will consult   Bethann Berkshire, MD 05/23/20 2042

## 2020-05-23 NOTE — ED Notes (Signed)
Waiting for MRI

## 2020-05-23 NOTE — ED Notes (Signed)
Patient to MRI.

## 2020-05-23 NOTE — ED Notes (Signed)
To CT

## 2020-05-23 NOTE — ED Provider Notes (Signed)
[facility]-EMERGENCY DEPT Provider Note   CSN: [id number] Arrival date & time: 05/23/20  0510     History Left hand numbness and weakness  Sarah Evans is a 40 y.o. female.  HPI   Patient states she started having numbness and weakness of her left hand yesterday evening.  Patient states it is hard for her to move her fingers and her hand feels numb.  The symptoms have persisted.  She is not having any difficulty with her speech or her vision.  No leg weakness.  The rest of her arm seems fine.  Patient denies any prior history of stroke.  She does have medical problems as indicated below in her past medical history and problem list.  Nothing seems make it better or worse.  Patient states she cannot move her hand at all but when I saw her moving it patient indicated that she is not moving it as well but yet she can move it somewhat.  She denies any pain  Past Medical History:  Diagnosis Date  . Bipolar 1 disorder (HCC)   . Herpes simplex virus (HSV) infection    dx 2001  . Polycystic ovary disease   . Schizophrenia [facility])     Patient Active Problem List   Diagnosis Date Noted  . BMI 29.0-29.9,adult 05/21/2020  . HSV (herpes simplex virus) anogenital infection 05/21/2020  . Controlled type 2 diabetes mellitus (HCC) 04/12/2020  . Intrinsic eczema 04/12/2020  . Hyperlipidemia associated with type 2 diabetes mellitus (HCC) 04/12/2020  . Muscle spasticity 04/12/2020  . Moderate episode of recurrent major depressive disorder (HCC) 06/09/2019  . Generalized anxiety disorder 06/09/2019  . Bipolar disorder, current episode depressed, moderate (HCC) 04/28/2018  . Personal history of nonsuicidal self-harm 04/28/2018  . PTSD (post-traumatic stress disorder) 04/30/2015  . Schizoaffective disorder, bipolar type (HCC) 04/27/2015    Past Surgical History:  Procedure Laterality Date  . NO PAST SURGERIES       OB History    Gravida  1   Para  1   Term  0    Preterm  1   AB  0   Living  0     SAB  0   IAB  0   Ectopic  0   Multiple  0   Live Births  1           Family History  Problem Relation Age of Onset  . Diabetes Maternal Grandmother   . Bipolar disorder Mother   . Hypertension Neg Hx   . Heart disease Neg Hx   . Cancer Neg Hx     Social History   Tobacco Use  . Smoking status: Former Smoker    Packs/day: 0.25    Years: 15.00    Pack years: 3.75    Types: Cigarettes    Quit date: 05/02/2020    Years since quitting: 0.0  . Smokeless tobacco: Never Used  Vaping Use  . Vaping Use: Never used  Substance Use Topics  . Alcohol use: No    Comment: occ  . Drug use: No    Home Medications Prior to Admission medications   Medication Sig Start Date End Date Taking? Authorizing Provider  atorvastatin (LIPITOR) 10 MG tablet Take 1 tablet (10 mg total) by mouth daily. 04/12/20   Storm Frisk, MD  busPIRone (BUSPAR) 15 MG tablet Take 1 tablet (15 mg total) by mouth 3 (three) times daily. 12/06/19   Shanna Cisco, NP  clobetasol cream (TEMOVATE) 0.05 % Apply 1 application topically 2 (two) times daily. 04/12/20   Storm Frisk, MD  metFORMIN (GLUCOPHAGE) 500 MG tablet Take 1 tablet (500 mg total) by mouth daily with breakfast. 04/12/20   Storm Frisk, MD  naproxen (NAPROSYN) 500 MG tablet Take 1 tablet (500 mg total) by mouth 2 (two) times daily with a meal. 09/05/18   Claiborne Rigg, NP  norgestimate-ethinyl estradiol (MILI) 0.25-35 MG-MCG tablet Take 1 tablet by mouth daily. 04/02/20   Alric Seton, MD  pantoprazole (PROTONIX) 20 MG tablet Take 1 tablet (20 mg total) by mouth daily. 05/16/20   Adam Phenix, MD  polyethylene glycol powder (GLYCOLAX/MIRALAX) 17 GM/SCOOP powder Take 17 g by mouth 2 (two) times daily as needed. 04/14/20   Storm Frisk, MD  prazosin (MINIPRESS) 1 MG capsule Take 1 capsule (1 mg total) by mouth at bedtime. 12/06/19   Shanna Cisco, NP  QUEtiapine (SEROQUEL)  100 MG tablet Take 1 tablet (100 mg total) by mouth at bedtime. 12/06/19   Shanna Cisco, NP  QUEtiapine (SEROQUEL) 25 MG tablet Take 1 tablet (25 mg total) by mouth 2 (two) times daily. 12/06/19   Shanna Cisco, NP  spironolactone (ALDACTONE) 100 MG tablet TAKE 1 TABLET BY MOUTH 2 TIMES DAILY 04/02/20 04/02/21  Alric Seton, MD  tiZANidine (ZANAFLEX) 4 MG tablet Take 1 tablet (4 mg total) by mouth 3 (three) times daily as needed for muscle spasms. 04/12/20   Storm Frisk, MD  valACYclovir (VALTREX) 1000 MG tablet Take 1 tablet (1,000 mg total) by mouth daily. 05/21/20   Storm Frisk, MD    Allergies    Patient has no known allergies.  Review of Systems   Review of Systems  All other systems reviewed and are negative.   Physical Exam Updated Vital Signs BP 116/75 (BP Location: Left Arm)   Pulse (!) 59   Temp 98.2 F (36.8 C) (Oral)   Resp 18   Ht 1.702 m (5\' 7" )   Wt 84.8 kg   LMP 05/21/2020   SpO2 96%   BMI 29.29 kg/m   Physical Exam Vitals and nursing note reviewed.  Constitutional:      General: She is not in acute distress.    Appearance: She is well-developed.  HENT:     Head: Normocephalic and atraumatic.     Right Ear: External ear normal.     Left Ear: External ear normal.  Eyes:     General: No scleral icterus.       Right eye: No discharge.        Left eye: No discharge.     Conjunctiva/sclera: Conjunctivae normal.  Neck:     Trachea: No tracheal deviation.  Cardiovascular:     Rate and Rhythm: Normal rate and regular rhythm.  Pulmonary:     Effort: Pulmonary effort is normal. No respiratory distress.     Breath sounds: Normal breath sounds. No stridor. No wheezing or rales.  Abdominal:     General: Bowel sounds are normal. There is no distension.     Palpations: Abdomen is soft.     Tenderness: There is no abdominal tenderness. There is no guarding or rebound.  Musculoskeletal:        General: No tenderness.     Cervical back:  Neck supple.     Comments: No cyanosis, normal pulses throughout  Skin:    General: Skin is warm and dry.  Findings: No rash.  Neurological:     Mental Status: She is alert and oriented to person, place, and time.     Cranial Nerves: No cranial nerve deficit (no facial droop, extraocular movements intact, no slurred speech).     Sensory: No sensory deficit.     Motor: No abnormal muscle tone or seizure activity.     Coordination: Coordination normal.     Comments: No pronator drift bilateral upper extrem, able to hold both legs off bed for 5 seconds, sensation intact in all extremities, no visual field cuts, no left or right sided neglect, normal finger-nose exam bilaterally, no nystagmus noted  Left grip strength weaker compared to right, subjective decreased sensation entire left hand compared to right     ED Results / Procedures / Treatments   Labs (all labs ordered are listed, but only abnormal results are displayed) Labs Reviewed  RAPID URINE DRUG SCREEN, HOSP PERFORMED - Abnormal; Notable for the following components:      Result Value   Tetrahydrocannabinol POSITIVE (*)    All other components within normal limits  URINALYSIS, ROUTINE W REFLEX MICROSCOPIC - Abnormal; Notable for the following components:   APPearance HAZY (*)    Hgb urine dipstick MODERATE (*)    Ketones, ur 20 (*)    All other components within normal limits  HEMOGLOBIN A1C - Abnormal; Notable for the following components:   Hgb A1c MFr Bld 6.1 (*)    All other components within normal limits  BASIC METABOLIC PANEL - Abnormal; Notable for the following components:   Calcium 8.5 (*)    All other components within normal limits  GLUCOSE, CAPILLARY - Abnormal; Notable for the following components:   Glucose-Capillary 119 (*)    All other components within normal limits  RESP PANEL BY RT-PCR (FLU A&B, COVID) ARPGX2  ETHANOL  PROTIME-INR  APTT  CBC  DIFFERENTIAL  COMPREHENSIVE METABOLIC PANEL  HIV  ANTIBODY (ROUTINE TESTING W REFLEX)  LIPID PANEL  CBC  ANTITHROMBIN III  RPR  SEDIMENTATION RATE  PROTEIN C ACTIVITY  PROTEIN C, TOTAL  PROTEIN S ACTIVITY  PROTEIN S, TOTAL  LUPUS ANTICOAGULANT PANEL  BETA-2-GLYCOPROTEIN I ABS, IGG/M/A  HOMOCYSTEINE  FACTOR 5 LEIDEN  PROTHROMBIN GENE MUTATION  CARDIOLIPIN ANTIBODIES, IGG, IGM, IGA  ANA W/REFLEX IF POSITIVE  SJOGRENS SYNDROME-A EXTRACTABLE NUCLEAR ANTIBODY  SJOGRENS SYNDROME-B EXTRACTABLE NUCLEAR ANTIBODY  I-STAT BETA HCG BLOOD, ED ([facility], [facility], [facility] ONLY)    EKG EKG Interpretation  Date/Time:  Wednesday May 23 2020 08:57:39 EDT Ventricular Rate:  66 PR Interval:  140 QRS Duration: 98 QT Interval:  413 QTC Calculation: 433 R Axis:   68 Text Interpretation: Sinus rhythm No significant change since last tracing Confirmed by Linwood DibblesKnapp, Sehaj Mcenroe [phone] on 05/23/2020 9:13:59 AM   Radiology  CT HEAD WO CONTRAST  Result Date: 05/23/2020 CLINICAL DATA:  40 year old female with dizziness and left hand weakness since yesterday. EXAM: CT HEAD WITHOUT CONTRAST TECHNIQUE: Contiguous axial images were obtained from the base of the skull through the vertex without intravenous contrast. COMPARISON:  None. FINDINGS: Brain: Abnormal roughly 3 cm area of loss gray-white matter differentiation in the anterior right middle frontal gyrus near the frontal operculum (series 2, image 18 and series 5, image 22). Heterogeneous density there raising the possibility of petechial hemorrhage. No significant mass effect. Elsewhere gray-white matter differentiation appears to be normal. No midline shift, ventriculomegaly. No other acute or chronic cortically based infarct. Vascular: No suspicious intracranial vascular hyperdensity. Skull: Negative. Sinuses/Orbits:  Visualized paranasal sinuses and mastoids are clear. Tympanic cavities appear clear. Other: Visualized orbits and scalp soft tissues are within normal limits. IMPRESSION: Abnormal 3 cm area of mixed density in the  right middle frontal gyrus. The noncontrast CT appearance most resembles a recent infarct with petechial hemorrhage. No significant mass effect. But other differential considerations include tumor and sequelae of a vascular malformation. Recommend Brain MRI without and with contrast to further characterize. Electronically Signed   By: Odessa Fleming M.D.   On: 05/23/2020 09:37     Procedures Procedures   Medications Ordered in ED Medications  sodium chloride 0.9 % bolus 500 mL (has no administration in time range)    Followed by  0.9 %  sodium chloride infusion (has no administration in time range)    ED Course  I have reviewed the triage vital signs and the nursing notes.  Pertinent labs & imaging results that were available during my care of the patient were reviewed by me and considered in my medical decision making (see chart for details).  Clinical Course as of 05/24/20 1152  Wed May 23, 2020  1140 CBC and metabolic panel unremarkable [JK]  1243 CT scan is abnormal.  MRI of the brain is recommended.  We will proceed with that [JK]    Clinical Course User Index [JK] Linwood Dibbles, MD   MDM Rules/Calculators/A&P                          Patient presented to the ED for evaluation of numbness in her left hand.  On exam patient had weakness and decreased sensation in her left hand.  Symptoms were concerning for the possibility of stroke, TIA, tumor.  Peripheral nerve compression was also a consideration.  Patient's initial laboratory tests were unremarkable.  Her CT scan however did show an abnormality concerning for the possibility of stroke.  MRI was recommended.  This was ordered in the ED.  Care was turned over to Dr. Estell Harpin pending the MRI result.  Patient will require admission if she had a stroke.  Patient was well outside tPA treatment window.  LVO screen was negative. Final Clinical Impression(s) / ED Diagnoses Hand numbness and weakness, possible stroke   Linwood Dibbles, MD 05/24/20  1155

## 2020-05-23 NOTE — ED Triage Notes (Signed)
Pt c/o L hand numbness/pain. States this started yesterday morning. Pt states she cannot move her hand. Pt seen moving her hand multiple times during triage. Denies injury

## 2020-05-24 ENCOUNTER — Observation Stay (HOSPITAL_BASED_OUTPATIENT_CLINIC_OR_DEPARTMENT_OTHER): Payer: Self-pay

## 2020-05-24 ENCOUNTER — Other Ambulatory Visit: Payer: Self-pay

## 2020-05-24 ENCOUNTER — Observation Stay (HOSPITAL_COMMUNITY): Payer: Self-pay

## 2020-05-24 DIAGNOSIS — E1169 Type 2 diabetes mellitus with other specified complication: Secondary | ICD-10-CM

## 2020-05-24 DIAGNOSIS — I639 Cerebral infarction, unspecified: Secondary | ICD-10-CM

## 2020-05-24 DIAGNOSIS — R200 Anesthesia of skin: Secondary | ICD-10-CM | POA: Diagnosis present

## 2020-05-24 DIAGNOSIS — I63411 Cerebral infarction due to embolism of right middle cerebral artery: Principal | ICD-10-CM

## 2020-05-24 DIAGNOSIS — R202 Paresthesia of skin: Secondary | ICD-10-CM | POA: Diagnosis present

## 2020-05-24 DIAGNOSIS — E785 Hyperlipidemia, unspecified: Secondary | ICD-10-CM

## 2020-05-24 DIAGNOSIS — I6389 Other cerebral infarction: Secondary | ICD-10-CM

## 2020-05-24 LAB — GLUCOSE, CAPILLARY
Glucose-Capillary: 119 mg/dL — ABNORMAL HIGH (ref 70–99)
Glucose-Capillary: 114 mg/dL — ABNORMAL HIGH (ref 70–99)
Glucose-Capillary: 108 mg/dL — ABNORMAL HIGH (ref 70–99)
Glucose-Capillary: 121 mg/dL — ABNORMAL HIGH (ref 70–99)

## 2020-05-24 LAB — BASIC METABOLIC PANEL
Anion gap: 6 (ref 5–15)
BUN: 10 mg/dL (ref 6–20)
CO2: 22 mmol/L (ref 22–32)
Calcium: 8.5 mg/dL — ABNORMAL LOW (ref 8.9–10.3)
Chloride: 109 mmol/L (ref 98–111)
Creatinine, Ser: 0.99 mg/dL (ref 0.44–1.00)
GFR, Estimated: >60 mL/min (ref >60)
Glucose, Bld: 94 mg/dL (ref 70–99)
Potassium: 3.9 mmol/L (ref 3.5–5.1)
Sodium: 137 mmol/L (ref 135–145)

## 2020-05-24 LAB — CBC
HCT: 40.2 % (ref 36.0–46.0)
Hemoglobin: 12.9 g/dL (ref 12.0–15.0)
MCH: 27.7 pg (ref 26.0–34.0)
MCHC: 32.1 g/dL (ref 30.0–36.0)
MCV: 86.3 fL (ref 80.0–100.0)
Platelets: 313 10*3/uL (ref 150–400)
RBC: 4.66 MIL/uL (ref 3.87–5.11)
RDW: 14.3 % (ref 11.5–15.5)
WBC: 6.1 10*3/uL (ref 4.0–10.5)
nRBC: 0 % (ref 0.0–0.2)

## 2020-05-24 LAB — ANTITHROMBIN III: AntiThromb III Func: 98 % (ref 75–120)

## 2020-05-24 LAB — LIPID PANEL
Cholesterol: 143 mg/dL (ref 0–200)
HDL: 52 mg/dL (ref >40)
LDL Cholesterol: 63 mg/dL (ref 0–99)
Total CHOL/HDL Ratio: 2.8 RATIO
Triglycerides: 142 mg/dL (ref <150)
VLDL: 28 mg/dL (ref 0–40)

## 2020-05-24 LAB — HEMOGLOBIN A1C
Hgb A1c MFr Bld: 6.1 % — ABNORMAL HIGH (ref 4.8–5.6)
Mean Plasma Glucose: 128.37 mg/dL

## 2020-05-24 LAB — CSF CELL COUNT WITH DIFFERENTIAL
RBC Count, CSF: 0 /mm3
RBC Count, CSF: 10 /mm3 — ABNORMAL HIGH
Tube #: 1
Tube #: 4
WBC, CSF: 1 /mm3 (ref 0–5)
WBC, CSF: 2 /mm3 (ref 0–5)

## 2020-05-24 LAB — HIV ANTIBODY (ROUTINE TESTING W REFLEX): HIV Screen 4th Generation wRfx: NONREACTIVE

## 2020-05-24 LAB — ECHOCARDIOGRAM COMPLETE BUBBLE STUDY: Area-P 1/2: 3.08 cm2

## 2020-05-24 LAB — PROTEIN AND GLUCOSE, CSF
Glucose, CSF: 71 mg/dL — ABNORMAL HIGH (ref 40–70)
Total  Protein, CSF: 24 mg/dL (ref 15–45)

## 2020-05-24 LAB — RESP PANEL BY RT-PCR (FLU A&B, COVID) ARPGX2
Influenza A by PCR: NEGATIVE
Influenza B by PCR: NEGATIVE
SARS Coronavirus 2 by RT PCR: NEGATIVE

## 2020-05-24 LAB — ETHANOL: Alcohol, Ethyl (B): <10 mg/dL (ref <10)

## 2020-05-24 LAB — CRYPTOCOCCAL ANTIGEN, CSF: Crypto Ag: NEGATIVE

## 2020-05-24 LAB — RPR: RPR Ser Ql: NONREACTIVE

## 2020-05-24 LAB — SEDIMENTATION RATE: Sed Rate: 5 mm/hr (ref 0–22)

## 2020-05-24 MED ORDER — ATORVASTATIN CALCIUM 10 MG PO TABS
10.0000 mg | ORAL_TABLET | Freq: Every day | ORAL | Status: DC
  Administered 2020-05-24 – 2020-05-25 (×2): 10 mg via ORAL
  Filled 2020-05-24 (×2): qty 1

## 2020-05-24 MED ORDER — ASPIRIN EC 81 MG PO TBEC
81.0000 mg | DELAYED_RELEASE_TABLET | Freq: Every day | ORAL | Status: DC
  Administered 2020-05-24 – 2020-05-25 (×2): 81 mg via ORAL
  Filled 2020-05-24 (×2): qty 1

## 2020-05-24 MED ORDER — LIDOCAINE HCL (PF) 1 % IJ SOLN
5.0000 mL | Freq: Once | INTRAMUSCULAR | Status: AC
  Administered 2020-05-24: 5 mL via INTRADERMAL
  Filled 2020-05-24: qty 5

## 2020-05-24 MED ORDER — ATORVASTATIN CALCIUM 10 MG PO TABS: 10.0000 mg | ORAL_TABLET | Freq: Every day | ORAL | Status: DC

## 2020-05-24 MED ORDER — ASPIRIN 81 MG PO CHEW: 81.0000 mg | CHEWABLE_TABLET | Freq: Every day | ORAL | Status: DC

## 2020-05-24 MED ORDER — CLOPIDOGREL BISULFATE 75 MG PO TABS
75.0000 mg | ORAL_TABLET | Freq: Every day | ORAL | Status: DC
  Administered 2020-05-25: 75 mg via ORAL
  Filled 2020-05-24: qty 1

## 2020-05-24 MED ORDER — SODIUM CHLORIDE 0.9% FLUSH
10.0000 mL | Freq: Once | INTRAVENOUS | Status: AC
  Administered 2020-05-24: 10 mL via INTRAVENOUS

## 2020-05-24 MED ORDER — ASPIRIN 325 MG PO TABS: 325.0000 mg | ORAL_TABLET | Freq: Once | ORAL | Status: DC

## 2020-05-24 MED ORDER — IOHEXOL 350 MG/ML SOLN
50.0000 mL | Freq: Once | INTRAVENOUS | Status: AC | PRN
  Administered 2020-05-24: 50 mL via INTRAVENOUS

## 2020-05-24 NOTE — Evaluation (Signed)
Occupational Therapy Evaluation Patient Details Name: Sarah Evans MRN: [id number] DOB: [birth year] Today's Date: 05/24/2020    History of Present Illness 40 y.o. female presenting with L hand weakness/numbness. Imaging (+) multiple cortical/subcortical lesions within R hemisphere concerning for subacute infarcts wtih scattered petechiae. PMHx significant for prediabetes, HLD, PTSD, schizoaffective disorder, bipolar disorder, depression/anxiety, HSV, and PCOS.   Clinical Impression   PTA patient was renting a room in a private residence and was grossly I with ADLs/IADLs including working part-time as a Conservation officer, nature at [employer]. Patient currently functioning slightly below baseline demonstrating observed ADLs including LB dressing and grooming standing at sink level with Mod I and increased time. Patient also limited by deficits listed below including decreased strength, coordination, and sensation in L hand and would benefit from continued acute OT services in prep for safe d/c home. Given patient deficits, recommendation for follow up OPOT to maximize independence in prep for safe return to work.      Follow Up Recommendations  Outpatient OT    Equipment Recommendations  None recommended by OT    Recommendations for Other Services       Precautions / Restrictions Precautions Precautions: None Restrictions Weight Bearing Restrictions: No      Mobility Bed Mobility                    Transfers Overall transfer level: Independent                    Balance Overall balance assessment: No apparent balance deficits (not formally assessed)                                         ADL either performed or assessed with clinical judgement   ADL Overall ADL's : Modified independent                                       General ADL Comments: Demonstrates ADL transfers with I and Mod I to don LB clothing secondary to decresed LUE  [facility] and need for increased time.     Vision Baseline Vision/History: No visual deficits Patient Visual Report: No change from baseline Vision Assessment?: No apparent visual deficits     Perception     Praxis      Pertinent Vitals/Pain Pain Assessment: No/denies pain     Hand Dominance Right   Extremity/Trunk Assessment Upper Extremity Assessment Upper Extremity Assessment: LUE deficits/detail LUE Deficits / Details: AROM WFL at shoulder and wrist. Decreased extension of wrist/digits. Gross grasp 3+/5. Opposition at 2nd and 3rd digits only. LUE Sensation: decreased light touch LUE Coordination: decreased fine motor;decreased gross motor   Lower Extremity Assessment Lower Extremity Assessment: Defer to PT evaluation   Cervical / Trunk Assessment Cervical / Trunk Assessment: Normal   Communication Communication Communication: No difficulties   Cognition Arousal/Alertness: Awake/alert Behavior During Therapy: WFL for tasks assessed/performed Overall Cognitive Status: Within Functional Limits for tasks assessed                                     General Comments       Exercises Exercises: Hand exercises Hand Exercises Forearm Supination: AROM;Left;10 reps Forearm Pronation: AROM;Left;10 reps Wrist Flexion:  AROM;Left;10 reps Wrist Extension: AAROM;Left;10 reps Digit Composite Flexion: AROM;Left;10 reps Composite Extension: AROM;Left;10 reps Digit Composite Abduction: AAROM;Left;10 reps Digit Composite Adduction: AAROM;Left;10 reps Digit Lifts: AAROM;Left;10 reps Opposition: AAROM;Left;10 reps   Shoulder Instructions      Home Living Family/patient expects to be discharged to:: Private residence Living Arrangements: Alone (rent a room out of the house - other people are in the house) Available Help at Discharge: Family;Available PRN/intermittently Type of Home: House Home Access: Stairs to enter [employer] of Steps:  2-3 Entrance Stairs-Rails:  (Unable to recall; may have rail on R or L) Home Layout: Two level Alternate Level Stairs-Number of Steps: Full flight   Bathroom Shower/Tub: Chief Strategy Officer: Standard     Home Equipment: Grab bars - tub/shower          Prior Functioning/Environment Level of Independence: Independent        Comments: I with ADLs/IADLs; driving; working as a Conservation officer, nature at [employer]        OT Problem List: Decreased strength;Decreased range of motion;Impaired sensation;Impaired UE functional use      OT Treatment/Interventions: Self-care/ADL training;Therapeutic exercise;Neuromuscular education;Therapeutic activities;Patient/family education    OT Goals(Current goals can be found in the care plan section) Acute Rehab OT Goals Patient Stated Goal: To increase function of LUE. OT Goal Formulation: With patient Time For Goal Achievement: 06/07/20 Potential to Achieve Goals: Good ADL Goals Additional ADL Goal #1: Patient will complete ADLs with I and good utilization of LUE. Additional ADL Goal #2: Patient will compelte LUE HEP with I using written handout.  OT Frequency: Min 2X/week   Barriers to D/C:            Co-evaluation              AM-PAC OT "6 Clicks" Daily Activity     Outcome Measure Help from another person eating meals?: None Help from another person taking care of personal grooming?: None Help from another person toileting, which includes using toliet, bedpan, or urinal?: None Help from another person bathing (including washing, rinsing, drying)?: A Little Help from another person to put on and taking off regular upper body clothing?: None Help from another person to put on and taking off regular lower body clothing?: A Little 6 Click Score: 22   End of Session    Activity Tolerance: Patient tolerated treatment well Patient left: in chair;with call bell/phone within reach  OT Visit Diagnosis: Hemiplegia and  hemiparesis Hemiplegia - Right/Left: Left Hemiplegia - dominant/non-dominant: Non-Dominant Hemiplegia - caused by: Cerebral infarction                Time: 2836-6294 OT Time Calculation (min): 23 min Charges:  OT General Charges $OT Visit: 1 Visit OT Evaluation $OT Eval Low Complexity: 1 Low OT Treatments $Neuromuscular Re-education: 8-22 mins  Adlee Paar H. OTR/L Supplemental OT, Department of rehab services [phone]  Atreus Hasz R H. 05/24/2020, 10:28 AM

## 2020-05-24 NOTE — Progress Notes (Signed)
Echocardiogram 2D Echocardiogram with bubble study has been performed.  Leta Jungling M 05/24/2020, 1:51 PM

## 2020-05-24 NOTE — Progress Notes (Addendum)
STROKE TEAM PROGRESS NOTE   INTERVAL HISTORY No visitors at bedside.  Today she reports her symptoms are stable. She has no new concerns. She denies any new difficulties including new weakness, sensation changes, vision changes, difficulty with speech or processing, balance or walking. She reports first diagnosed with herpes at age 40. Since that times she has had vesicular red lesions on her face, arms, neck and genitalia at times which she attributes to HSV. We discussed her stroke diagnosis, plan of care and ongoing work up. We discussed the need for LP and it's role in her diagnostic process. She agreed to proceed. Her questions were answered.   Vitals:   05/23/20 2315 05/23/20 2330 05/24/20 0000 05/24/20 0544  BP: 111/61 (!) 116/58 111/66 122/67  Pulse: 65 68 70 (!) 59  Resp: 18 17 18 18   Temp:   98.2 F (36.8 C) 98.4 F (36.9 C)  TempSrc:   Oral   SpO2: 98% 100% 98% 97%  Weight:      Height:       CBC:  Recent Labs  Lab 05/23/20 0833 05/24/20 0332  WBC 7.8 6.1  NEUTROABS 5.3  --   HGB 13.5 12.9  HCT 41.9 40.2  MCV 86.6 86.3  PLT 332 313   Basic Metabolic Panel:  Recent Labs  Lab 05/23/20 0833 05/24/20 0332  NA 140 137  K 3.7 3.9  CL 108 109  CO2 22 22  GLUCOSE 93 94  BUN 13 10  CREATININE 0.86 0.99  CALCIUM 9.2 8.5*   Lipid Panel:  Recent Labs  Lab 05/24/20 0332  CHOL 143  TRIG 142  HDL 52  CHOLHDL 2.8  VLDL 28  LDLCALC 63   HgbA1c:  Recent Labs  Lab 05/24/20 0332  HGBA1C 6.1*   Urine Drug Screen:  Recent Labs  Lab 05/23/20 1126  LABOPIA NONE DETECTED  COCAINSCRNUR NONE DETECTED  LABBENZ NONE DETECTED  AMPHETMU NONE DETECTED  THCU POSITIVE*  LABBARB NONE DETECTED    Alcohol Level  Recent Labs  Lab 05/24/20 0332  ETH <10   Beta-hCG negative  IMAGING past 24 hours MR Brain W and Wo Contrast  Result Date: 05/23/2020 CLINICAL DATA:  Abnormal head CT.  Left upper extremity weakness. EXAM: MRI HEAD WITHOUT AND WITH CONTRAST  TECHNIQUE: Multiplanar, multiecho pulse sequences of the brain and surrounding structures were obtained without and with intravenous contrast. CONTRAST:  8.785mL GADAVIST GADOBUTROL 1 MMOL/ML IV SOLN COMPARISON:  None. FINDINGS: Brain: There is multifocal abnormal diffusion restriction within the right hemisphere, affecting the right middle and precentral gyri and the posterior right parietal lobe and right occipital lobe. Petechial blood products associated with the right frontal lesion. There is abnormal leptomeningeal contrast enhancement associated with the diffusion restricting lesions. Mild edema at the lesion sites but otherwise normal white matter signal. The midline structures are normal. Vascular: Major flow voids are preserved. Skull and upper cervical spine: Normal calvarium and skull base. Visualized upper cervical spine and soft tissues are normal. Sinuses/Orbits:No paranasal sinus fluid levels or advanced mucosal thickening. No mastoid or middle ear effusion. Normal orbits. IMPRESSION: 1. Multiple diffusion restricting cortical/subcortical lesions within the right hemisphere with overlying leptomeningeal contrast enhancement, most consistent with subacute infarcts. One of the lesions directly affects the hand region of the motor strip. 2. Petechial blood products associated with the right frontal lesion site. Heidelberg classification 1a: HI1, scattered small petechiae, no mass effect. Electronically Signed   By: Deatra RobinsonKevin  Herman M.D.   On: 05/23/2020  19:22   VAS [place] LOWER EXTREMITY VENOUS (DVT)  Result Date: 05/24/2020  Lower Venous DVT Study Patient Name:  Sarah Evans  Date of Exam:   05/24/2020 Medical Rec #: [id number]      Accession #:    [id number] Date of Birth: [birth year]      Patient Gender: F Patient Age:   6Y Exam Location:  [facility] Procedure:      VAS [place] LOWER EXTREMITY VENOUS (DVT) Referring Phys: [id number] Marvel Plan  --------------------------------------------------------------------------------  Indications: Stroke.  Comparison Study: no prior Performing Technologist: Blanch Media RVS  Examination Guidelines: A complete evaluation includes B-mode imaging, spectral Doppler, color Doppler, and power Doppler as needed of all accessible portions of each vessel. Bilateral testing is considered an integral part of a complete examination. Limited examinations for reoccurring indications may be performed as noted. The reflux portion of the exam is performed with the patient in reverse Trendelenburg.  +---------+---------------+---------+-----------+----------+-------------------+ RIGHT    CompressibilityPhasicitySpontaneityPropertiesThrombus Aging      +---------+---------------+---------+-----------+----------+-------------------+ CFV      Full           Yes      Yes                                      +---------+---------------+---------+-----------+----------+-------------------+ SFJ      Full                                                             +---------+---------------+---------+-----------+----------+-------------------+ FV Prox  Full                                                             +---------+---------------+---------+-----------+----------+-------------------+ FV Mid   Full                                                             +---------+---------------+---------+-----------+----------+-------------------+ FV DistalFull                                                             +---------+---------------+---------+-----------+----------+-------------------+ PFV      Full                                                             +---------+---------------+---------+-----------+----------+-------------------+ POP      Full           Yes      Yes                                       +---------+---------------+---------+-----------+----------+-------------------+  PTV      Full                                                             +---------+---------------+---------+-----------+----------+-------------------+ PERO                                                  Not well visualized +---------+---------------+---------+-----------+----------+-------------------+   +---------+---------------+---------+-----------+----------+--------------+ LEFT     CompressibilityPhasicitySpontaneityPropertiesThrombus Aging +---------+---------------+---------+-----------+----------+--------------+ CFV      Full           Yes      Yes                                 +---------+---------------+---------+-----------+----------+--------------+ SFJ      Full                                                        +---------+---------------+---------+-----------+----------+--------------+ FV Prox  Full                                                        +---------+---------------+---------+-----------+----------+--------------+ FV Mid   Full                                                        +---------+---------------+---------+-----------+----------+--------------+ FV DistalFull                                                        +---------+---------------+---------+-----------+----------+--------------+ PFV      Full                                                        +---------+---------------+---------+-----------+----------+--------------+ POP      Full           Yes      Yes                                 +---------+---------------+---------+-----------+----------+--------------+ PTV      Full                                                        +---------+---------------+---------+-----------+----------+--------------+  PERO     Full                                                         +---------+---------------+---------+-----------+----------+--------------+     Summary: BILATERAL: - No evidence of deep vein thrombosis seen in the lower extremities, bilaterally. -No evidence of popliteal cyst, bilaterally.   *See table(s) above for measurements and observations.    Preliminary    PHYSICAL EXAM Constitutional: Appears well-developed and well-nourished. Sitting up in chair in NAD.  Psych: Affect appropriate to situation, calm and cooperative, appropriately mildly anxious Eyes: No scleral injection HENT: No oropharyngeal obstruction.  MSK: no joint deformities.  Cardiovascular: Normal rate and regular rhythm.  Respiratory: Effort normal, non-labored breathing Skin: Warm dry and intact visible skin  Neuro: Mental Status: Patient is awake, alert, oriented x4 Patient is able to give a clear and coherent history. No signs of aphasia or neglect Cranial Nerves: II: Visual Fields are full. Pupils are equal, round, and reactive to light.   III,IV, VI: EOMI without ptosis or diploplia.  V: Facial sensation is symmetric to temperature VII: Facial movement is symmetric.  VIII: hearing is intact to voice X: Uvula elevates symmetrically XI: Shoulder shrug is symmetric. XII: tongue is midline without atrophy or fasciculations.  Motor: Tone is normal. Bulk is normal. 5/5 strength was present in all four extremities except mild left deltoid weakness 4/5, finger extension weakness 4/5 finger flexion weakness 4/5 Sensory: Sensation is symmetric to light touch and temperature in the arms and legs Deep Tendon Reflexes: 2+ and symmetric in the biceps and patellae.  Though she does have some trouble relaxing Plantars: Toes are mute bilaterally.  Cerebellar: FNF and HKS are intact bilaterally  ASSESSMENT/PLAN NESA DISTEL is a 40 y.o. female with a past medical history significant for pre-diabetes, hyperlipidemia, PTSD/schizoaffective disorder/bipolar disorder/depression/anxiety,  HSV on valacyclovir, PCOS on oral contraceptive since 2020. She presented with trouble using her left hand noticed while eating, with associated numbness.   Right MCA/PCA territory embolic stroke with unknown etiology with work up underway.   Code Stroke: Abnormal 3 cm area of mixed density in the right middle frontal gyrus. The noncontrast CT appearance most resembles a recent infarct with petechial hemorrhage  CTA head & neck: No LVO   MRI brain with patchy embolic appearing stroke in the R MCA and PCA territories   2D Echo: EF 60-65%. No thrombus, wall motion abnormality or shunt found.   LDL 63  HgbA1c 6.1  Sed rate wnl  UDS positive for [facility]  Hypercoag labs are pending  LP performed with CSF labs pending  RPR negative  ANA, Sjogrens pending  TEE is pending   VTE prophylaxis - is recommended    Diet   Diet Heart Room service appropriate? Yes; Fluid consistency: Thin   Continue tele   No AC/AP prior to admission  Aspirin 81mg  at present  Therapy recommendations: TBD  Disposition:  TBD  BP management  No hx of HTN . Permissive hypertension (OK if < 220/120) but gradually normalize in 5-7 days . Long-term BP goal normotensive  Hyperlipidemia  Home meds: None  LDL 63, at goal < 70  Low dose Lipitor started at 10mg  as LDL at goal.   Other Stroke Risk Factors  Former Cigarette smoker, quit 3 weeks ago  Substance abuse - UDS:  THC POSITIVE, Cocaine NONE DETECTED. Patient advised to stop using due to stroke risk.  Overweight,  Body mass index is 29.29 kg/m., BMI >/= 30 associated with increased stroke risk, recommend weight loss, diet and exercise as appropriate   Other Active Problems  Schhizoaffective disorder:home regimen per primary team   Hospital day # 0  Delila A Bailey-Modzik, NP-C   ATTENDING NOTE: I reviewed above note and agree with the assessment and plan. Pt was seen and examined.   40 year old female with history of  prediabetes, hyperlipidemia, PCOS on OCP, PTSD, genital herpes infection admitted for left hand numbness and weakness.  CT concerning for right frontal infarcts.  MRI showed right frontal infarct near motor strip.  CTA head neck no LVO except possible high-grade stenosis versus occlusion of small distal right anterior MCA branch correlating to the right frontal infarct.  EF 60 to 65%.  LE venous Doppler no DVT.  A1c 6.1, LDL 63, hypercoagulable work-up pending.  UDS showed positive THC.  Creatinine 0.99.   On exam, patient has mild left hand numbness, left finger grip 4/5.  Otherwise no neurological deficit.  Etiology for patient stroke unclear, will need further work-up.  Requested TEE tomorrow.  Patient does have risk factor of OCP use due to PCOS, however LE venous Doppler no DVT.  We will follow-up with hypercoagulable work-up.  Patient did have genital herpes infection, currently on Valtrex, patient did have a right cheek single herpes rash recently, but now healed.  We will do LP to rule out HSV encephalitis/vasculitis.  Based on above work-up, may consider 30-day cardiac event monitoring and cerebral angiogram.  Currently on aspirin 81 in preparation for LP today, will put on Plavix tomorrow.  Continue Lipitor 10.  We will follow  For detailed assessment and plan, please refer to above as I have made changes wherever appropriate.   Marvel Plan, MD PhD Stroke Neurology 05/24/2020 7:59 PM  I spent  35 minutes in total face-to-face time with the patient, more than 50% of which was spent in counseling and coordination of care, reviewing test results, images and medication, and discussing the diagnosis, treatment plan and potential prognosis. This patient's care requiresreview of multiple databases, neurological assessment, discussion with family, other specialists and medical decision making of high complexity.   To contact Stroke Continuity provider, please refer to [email or web address]. After hours, contact  General Neurology

## 2020-05-24 NOTE — Progress Notes (Signed)
PT Cancellation Note  Patient Details Name: Sarah Evans MRN: [id number] DOB: 1980-01-20   Cancelled Treatment:    Reason Eval/Treat Not Completed:   PT screened, no needs identified, will sign off. Noted no balance issues noted in OT evaluation. Pt denies changes in leg or balance and has been up walking in room.    Jerolyn Center, PT Pager [phone]    Sarah Evans 05/24/2020, 2:00 PM

## 2020-05-24 NOTE — Progress Notes (Signed)
Assisted MD Xu perform lumbar puncture at bedside. Patient tolerated well. Informed by MD to keep patient lying supine for 2 hours to reduce the risk of potential side effects. MD instructed patient to maintain supine for 2 hours. At the completion of procedure, vitals were obtained, and patient was instructed to maintain supine in bed until 5:54pm. Patient verbalized understanding of specific instructions from MD and me.    4:52pm: rounding on patient and noted patient to be sitting on the side of the bed attempting to eat. I reiterated to patient that she must staying lying flat/supine in the bed on her back until 5:54pm. Pt. Stated, "oh okay."

## 2020-05-24 NOTE — TOC Initial Note (Signed)
Transition of Care [facility]) - Initial/Assessment Note    Patient Details  Name: Sarah Evans MRN: [id number] Date of Birth: 15-Jun-1980  Transition of Care [facility]) CM/SW Contact:    Mearl Latin, LCSW Phone Number: 05/24/2020, 7:11 PM  Clinical Narrative:                 CSW received request to contact patient's mother for assistance. CSW spoke with patient's mother. She requested assistance applying for Medicaid for patient. CSW emailed [employer] [employer] to see if patient qualifies and requested patient's mother follow up with the Department of Social Services. TOC can assist with medication assistance at discharge and transport home if needed.    Expected Discharge Plan: Home/Self Care Barriers to Discharge: Continued Medical Work up   Patient Goals and CMS Choice        Expected Discharge Plan and Services Expected Discharge Plan: Home/Self Care In-house Referral: Clinical Social Work,Financial Counselor Discharge Planning Services: CM Consult,Medication [facility]                                          Prior Living Arrangements/Services     Patient language and need for interpreter reviewed:: Yes        Need for Family Participation in Patient Care: Yes (Comment) Care giver support system in place?: Yes (comment)   Criminal Activity/Legal Involvement Pertinent to Current Situation/Hospitalization: No - Comment as needed  Activities of Daily Living Home Assistive Devices/Equipment: None ADL Screening (condition at time of admission) Patient's cognitive ability adequate to safely complete daily activities?: Yes Is the patient deaf or have difficulty hearing?: No Does the patient have difficulty seeing, even when wearing glasses/contacts?: Yes (trouble seeing yesterday morning and dizzy - both pupils were dilated per pt) Does the patient have difficulty concentrating, remembering, or making decisions?: No Patient able to express need for  assistance with ADLs?: Yes Does the patient have difficulty dressing or bathing?: Yes Independently performs ADLs?: No Communication: Independent Dressing (OT): Needs assistance Is this a change from baseline?: Change from baseline, expected to last >3 days Grooming: Independent Feeding: Independent Bathing: Needs assistance Is this a change from baseline?: Change from baseline, expected to last >3 days Toileting: Independent In/Out Bed: Independent Walks in Home: Independent Does the patient have difficulty walking or climbing stairs?: No Weakness of Legs: Both Weakness of Arms/Hands: Left (left hand weak)  Permission Sought/Granted                  Emotional Assessment       Orientation: : Oriented to Self,Oriented to Place,Oriented to  Time,Oriented to Situation Alcohol / Substance Use: Not Applicable Psych Involvement: No (comment)  Admission diagnosis:  Ischemic stroke (HCC) [I63.9] Stroke [facility]) [I63.9] Patient Active Problem List   Diagnosis Date Noted  . Stroke (HCC) 05/24/2020  . Ischemic stroke (HCC) 05/23/2020  . BMI 29.0-29.9,adult 05/21/2020  . HSV (herpes simplex virus) anogenital infection 05/21/2020  . Controlled type 2 diabetes mellitus (HCC) 04/12/2020  . Intrinsic eczema 04/12/2020  . Hyperlipidemia associated with type 2 diabetes mellitus (HCC) 04/12/2020  . Muscle spasticity 04/12/2020  . Moderate episode of recurrent major depressive disorder (HCC) 06/09/2019  . Generalized anxiety disorder 06/09/2019  . Bipolar disorder, current episode depressed, moderate (HCC) 04/28/2018  . Personal history of nonsuicidal self-harm 04/28/2018  . PTSD (post-traumatic stress disorder) 04/30/2015  . Schizoaffective disorder,  bipolar type (HCC) 04/27/2015   PCP:  Storm Frisk, MD Pharmacy:   [facility] - [place] - [place], [place] - [address] [place] [address] Phone: [phone] Fax: [phone]  [facility] - [place], [place] - [address] [place] [address] Phone: [phone] Fax: [phone]  [facility] [address] [place] [address] Phone: [phone] Fax: [phone]  [facility] - [place], [place] - [address] [place] [address] Phone: [phone] Fax: [phone]  [id number] [place], [place] - [address] AT [facility] & [address] [place] [address] Phone: [phone] Fax: [phone]     Social Determinants of Health (SDOH) Interventions    Readmission Risk Interventions No flowsheet data found.

## 2020-05-24 NOTE — Progress Notes (Signed)
PROGRESS NOTE    CAITLYNE INGHAM  VVO:[id number] DOB: 14-Oct-1980 DOA: 05/23/2020 PCP: Storm Frisk, MD    Brief Narrative:  40 y.o. female with medical history significant for schizoaffective disorder, polycystic ovary syndrome, type 2 diabetes mellitus, and hyperlipidemia, now presenting to the emergency department with left hand weakness.  Patient reports developing weakness involving the left hand at approximately 11 AM or noon yesterday.  She denies any headache, change in vision or hearing, chest pain or palpitations, numbness or tingling, or any other weakness.  She has never experienced this previously.  Denies any recent fall or trauma.   Assessment & Plan:   Principal Problem:   Ischemic stroke [facility]) Active Problems:   Schizoaffective disorder, bipolar type (HCC)   Controlled type 2 diabetes mellitus (HCC)   Hyperlipidemia associated with type 2 diabetes mellitus (HCC)  1. Ischemic CVA  - Presents with left hand weakness since ~noon on 05/22/20 - Imaging was notable for multiple cortical/subcortical lesions within the right hemisphere concerning for subacute infarcts with scattered petechiae  - Neurology consulting and is following - 2d echo reviewed. Normal LVEF. Bubble study was neg -LP performed today by Neurology -Cardiology consulted for TEE  2. Schizoaffective disorder   - Continue Seroquel, Buspar, and Prazosin  -Seems stable at this time  3. Type II DM  - A1c of 6.1 - continued on SSI coverage    4. Hyperlipidemia  - Pt is continued on Lipitor 10 mg  -Lipid panel reviewed. LDL of 63  DVT prophylaxis: SCD's Code Status: Full Family Communication: Pt in room, family not at bedside  Status is: Observation  The patient will require care spanning > 2 midnights and should be moved to inpatient because: Ongoing diagnostic testing needed not appropriate for outpatient work up and Inpatient level of care appropriate due to severity of illness  Dispo: The  patient is from: Home              Anticipated d/c is to: Home              Patient currently is not medically stable to d/c.   Difficult to place patient No       Consultants:   Neurology  Cardiology  Procedures:   LP 5/19  Antimicrobials: Anti-infectives (From admission, onward)   Start     Dose/Rate Route Frequency Ordered Stop   05/24/20 1000  valACYclovir (VALTREX) tablet 1,000 mg        1,000 mg Oral Daily 05/23/20 2221         Subjective: Still having weakness of L hand  Objective: Vitals:   05/24/20 0544 05/24/20 1218 05/24/20 1427 05/24/20 1616  BP: 122/67 121/79 113/72 116/74  Pulse: (!) 59 81 78 63  Resp: 18 17 16 16   Temp: 98.4 F (36.9 C) 98.5 F (36.9 C)  98.7 F (37.1 C)  TempSrc:  Oral  Oral  SpO2: 97% 100% 99% 99%  Weight:      Height:        Intake/Output Summary (Last 24 hours) at 05/24/2020 1725 Last data filed at 05/24/2020 0300 Gross per 24 hour  Intake 959.17 ml  Output 400 ml  Net 559.17 ml   Filed Weights   05/23/20 0532  Weight: 84.8 kg    Examination: General exam: Awake, laying in bed, in nad Respiratory system: Normal respiratory effort, no wheezing Cardiovascular system: regular rate, s1, s2 Gastrointestinal system: Soft, nondistended, positive BS Central nervous system: CN2-12 grossly intact, sensation  intact, L hand weakness Extremities: Perfused, no clubbing Skin: Normal skin turgor, no notable skin lesions seen Psychiatry: Mood normal // no visual hallucinations   Data Reviewed: I have personally reviewed following labs and imaging studies  CBC: Recent Labs  Lab 05/23/20 0833 05/24/20 0332  WBC 7.8 6.1  NEUTROABS 5.3  --   HGB 13.5 12.9  HCT 41.9 40.2  MCV 86.6 86.3  PLT 332 313   Basic Metabolic Panel: Recent Labs  Lab 05/21/20 0931 05/23/20 0833 05/24/20 0332  NA 139 140 137  K 4.4 3.7 3.9  CL 105 108 109  CO2 18* 22 22  GLUCOSE 86 93 94  BUN CREATININE 1.07* 0.86 0.99  CALCIUM  9.1 9.2 8.5*   GFR: Estimated Creatinine Clearance: 86.2 mL/min (by C-G formula based on SCr of 0.99 mg/dL). Liver Function Tests: Recent Labs  Lab 05/23/20 0833  AST 21  ALT 35  ALKPHOS 65  BILITOT 0.6  PROT 7.1  ALBUMIN 3.6   No results for input(s): LIPASE, AMYLASE in the last 168 hours. No results for input(s): AMMONIA in the last 168 hours. Coagulation Profile: Recent Labs  Lab 05/23/20 0833  INR 1.0   Cardiac Enzymes: No results for input(s): CKTOTAL, CKMB, CKMBINDEX, TROPONINI in the last 168 hours. BNP (last 3 results) No results for input(s): PROBNP in the last 8760 hours. HbA1C: Recent Labs    05/24/20 0332  HGBA1C 6.1*   CBG: Recent Labs  Lab 05/24/20 0944 05/24/20 1446 05/24/20 1707  GLUCAP 119* 114* 108*   Lipid Profile: Recent Labs    05/24/20 0332  CHOL 143  HDL 52  LDLCALC 63  TRIG 142  CHOLHDL 2.8   Thyroid Function Tests: No results for input(s): TSH, T4TOTAL, FREET4, T3FREE, THYROIDAB in the last 72 hours. Anemia Panel: No results for input(s): VITAMINB12, FOLATE, FERRITIN, TIBC, IRON, RETICCTPCT in the last 72 hours. Sepsis Labs: No results for input(s): PROCALCITON, LATICACIDVEN in the last 168 hours.  Recent Results (from the past 240 hour(s))  Resp Panel by RT-PCR (Flu A&B, Covid) Nasopharyngeal Swab     Status: None   Collection Time: 05/23/20 11:37 PM   Specimen: Nasopharyngeal Swab; Nasopharyngeal(NP) swabs in vial transport medium  Result Value Ref Range Status   SARS Coronavirus 2 by RT PCR NEGATIVE NEGATIVE Final    Comment: (NOTE) SARS-CoV-2 target nucleic acids are NOT DETECTED.  The SARS-CoV-2 RNA is generally detectable in upper respiratory specimens during the acute phase of infection. The lowest concentration of SARS-CoV-2 viral copies this assay can detect is 138 copies/mL. A negative result does not preclude SARS-Cov-2 infection and should not be used as the sole basis for treatment or other patient  management decisions. A negative result may occur with  improper specimen collection/handling, submission of specimen other than nasopharyngeal swab, presence of viral mutation(s) within the areas targeted by this assay, and inadequate number of viral copies(<138 copies/mL). A negative result must be combined with clinical observations, patient history, and epidemiological information. The expected result is Negative.  Fact Sheet for Patients:  BloggerCourse.com  Fact Sheet for Healthcare Providers:  SeriousBroker.it  This test is no t yet approved or cleared by the [place] FDA and  has been authorized for detection and/or diagnosis of SARS-CoV-2 by FDA under an Emergency Use Authorization (EUA). This EUA will remain  in effect (meaning this test can be used) for the duration of the COVID-19 declaration under Section 564(b)(1) of the Act, 21 U.S.C.section  360bbb-3(b)(1), unless the authorization is terminated  or revoked sooner.       Influenza A by PCR NEGATIVE NEGATIVE Final   Influenza B by PCR NEGATIVE NEGATIVE Final    Comment: (NOTE) The Xpert Xpress SARS-CoV-2/FLU/RSV plus assay is intended as an aid in the diagnosis of influenza from Nasopharyngeal swab specimens and should not be used as a sole basis for treatment. Nasal washings and aspirates are unacceptable for Xpert Xpress SARS-CoV-2/FLU/RSV testing.  Fact Sheet for Patients: BloggerCourse.com  Fact Sheet for Healthcare Providers: SeriousBroker.it  This test is not yet approved or cleared by the [place] FDA and has been authorized for detection and/or diagnosis of SARS-CoV-2 by FDA under an Emergency Use Authorization (EUA). This EUA will remain in effect (meaning this test can be used) for the duration of the COVID-19 declaration under Section 564(b)(1) of the Act, 21 U.S.C. section 360bbb-3(b)(1),  unless the authorization is terminated or revoked.  Performed at [facility], [address] [place], [place] [address]      Radiology Studies: CT ANGIO HEAD NECK W WO CM  Result Date: 05/24/2020 CLINICAL DATA:  Stroke follow-up. EXAM: CT ANGIOGRAPHY HEAD AND NECK TECHNIQUE: Multidetector CT imaging of the head and neck was performed using the standard protocol during bolus administration of intravenous contrast. Multiplanar CT image reconstructions and MIPs were obtained to evaluate the vascular anatomy. Carotid stenosis measurements (when applicable) are obtained utilizing NASCET criteria, using the distal internal carotid diameter as the denominator. CONTRAST:  67mL OMNIPAQUE IOHEXOL 350 MG/ML SOLN COMPARISON:  CT and MRI head May 23, 2020. FINDINGS: CTA NECK FINDINGS Aortic arch: Great vessel origins are patent. Right carotid system: No evidence of dissection, stenosis (50% or greater) or occlusion. Left carotid system: No evidence of dissection, stenosis (50% or greater) or occlusion. Vertebral arteries: Left dominant. No evidence of dissection, stenosis (50% or greater) or occlusion. Skeleton: No evidence of acute abnormality on limited assessment. Other neck: No acute abnormality. Upper chest: Metallic foreign body in the right upper lobe. Otherwise, visualized lung apices are clear. Review of the MIP images confirms the above findings CTA HEAD FINDINGS Anterior circulation: No evidence of large vessel occlusion or proximal hemodynamically significant stenosis. While evaluation is limited distally due to venous contamination, suspected high-grade stenosis versus occlusion of a small distal right anterior MCA branch vessel in the region of the right frontal infarct. Posterior circulation: No large vessel occlusion or proximal hemodynamically significant stenosis. Bilateral posterior communicating arteries. None evaluation of the distal PCAs due to venous contamination. Venous  sinuses: As permitted by contrast timing, patent. Review of the MIP images confirms the above findings Final report was delayed due to power outage that occurred at the time of dictation. Final report was discussed with Dr. Roda Shutters at 10:58 PM. IMPRESSION: 1. No large vessel occlusion or evidence of proximal hemodynamically significant stenosis. 2. While evaluation is limited distally due to venous contamination, suspected high-grade stenosis versus occlusion of a small distal right anterior MCA branch vessel in the region of the right frontal infarct. Electronically Signed   By: Feliberto Harts MD   On: 05/24/2020 11:09   CT HEAD WO CONTRAST  Result Date: 05/23/2020 CLINICAL DATA:  40 year old female with dizziness and left hand weakness since yesterday. EXAM: CT HEAD WITHOUT CONTRAST TECHNIQUE: Contiguous axial images were obtained from the base of the skull through the vertex without intravenous contrast. COMPARISON:  None. FINDINGS: Brain: Abnormal roughly 3 cm area of loss gray-white matter differentiation in  the anterior right middle frontal gyrus near the frontal operculum (series 2, image 18 and series 5, image 22). Heterogeneous density there raising the possibility of petechial hemorrhage. No significant mass effect. Elsewhere gray-white matter differentiation appears to be normal. No midline shift, ventriculomegaly. No other acute or chronic cortically based infarct. Vascular: No suspicious intracranial vascular hyperdensity. Skull: Negative. Sinuses/Orbits: Visualized paranasal sinuses and mastoids are clear. Tympanic cavities appear clear. Other: Visualized orbits and scalp soft tissues are within normal limits. IMPRESSION: Abnormal 3 cm area of mixed density in the right middle frontal gyrus. The noncontrast CT appearance most resembles a recent infarct with petechial hemorrhage. No significant mass effect. But other differential considerations include tumor and sequelae of a vascular malformation.  Recommend Brain MRI without and with contrast to further characterize. Electronically Signed   By: Odessa Fleming M.D.   On: 05/23/2020 09:37   MR Brain W and Wo Contrast  Result Date: 05/23/2020 CLINICAL DATA:  Abnormal head CT.  Left upper extremity weakness. EXAM: MRI HEAD WITHOUT AND WITH CONTRAST TECHNIQUE: Multiplanar, multiecho pulse sequences of the brain and surrounding structures were obtained without and with intravenous contrast. CONTRAST:  8.26mL GADAVIST GADOBUTROL 1 MMOL/ML IV SOLN COMPARISON:  None. FINDINGS: Brain: There is multifocal abnormal diffusion restriction within the right hemisphere, affecting the right middle and precentral gyri and the posterior right parietal lobe and right occipital lobe. Petechial blood products associated with the right frontal lesion. There is abnormal leptomeningeal contrast enhancement associated with the diffusion restricting lesions. Mild edema at the lesion sites but otherwise normal white matter signal. The midline structures are normal. Vascular: Major flow voids are preserved. Skull and upper cervical spine: Normal calvarium and skull base. Visualized upper cervical spine and soft tissues are normal. Sinuses/Orbits:No paranasal sinus fluid levels or advanced mucosal thickening. No mastoid or middle ear effusion. Normal orbits. IMPRESSION: 1. Multiple diffusion restricting cortical/subcortical lesions within the right hemisphere with overlying leptomeningeal contrast enhancement, most consistent with subacute infarcts. One of the lesions directly affects the hand region of the motor strip. 2. Petechial blood products associated with the right frontal lesion site. Heidelberg classification 1a: HI1, scattered small petechiae, no mass effect. Electronically Signed   By: Deatra Robinson M.D.   On: 05/23/2020 19:22   ECHOCARDIOGRAM COMPLETE BUBBLE STUDY  Result Date: 05/24/2020    ECHOCARDIOGRAM REPORT   Patient Name:   KRYSTI HICKLING Date of Exam: 05/24/2020 Medical  Rec #:  [id number]     Height:       67.0 in Accession #:    [id number]    Weight:       187.0 lb Date of Birth:  18-Mar-1980     BSA:          1.965 m Patient Age:    40 years      BP:           121/79 mmHg Patient Gender: F             HR:           81 bpm. Exam Location:  Inpatient Procedure: 2D Echo, Color Doppler, Cardiac Doppler, 3D Echo and Saline Contrast            Bubble Study                               MODIFIED REPORT: This report was modified by Weston Brass MD on 05/24/2020 due to  revision.  Indications:     Stroke 434.91 / I63.  History:         Patient has no prior history of Echocardiogram examinations.                  Risk Factors:Hypertension, Diabetes and Dyslipidemia.  Sonographer:     Leta Junglingiffany Cooper RDCS Referring Phys:  [id number] Gordy CouncilmanSRISHTI L BHAGAT Diagnosing Phys: Weston BrassGayatri Acharya MD IMPRESSIONS  1. Left ventricular ejection fraction, by estimation, is 60 to 65%. The left ventricle has normal function. The left ventricle has no regional wall motion abnormalities. Left ventricular diastolic parameters were normal. The average left ventricular global longitudinal strain is -21.0 %. The global longitudinal strain is normal.  2. Right ventricular systolic function is normal. The right ventricular size is normal. There is normal pulmonary artery systolic pressure. The estimated right ventricular systolic pressure is 21.3 mmHg.  3. The mitral valve is normal in structure. No evidence of mitral valve regurgitation. No evidence of mitral stenosis.  4. The aortic valve is tricuspid. Aortic valve regurgitation is not visualized. No aortic stenosis is present.  5. The inferior vena cava is normal in size with greater than 50% respiratory variability, suggesting right atrial pressure of 3 mmHg.  6. Agitated saline contrast bubble study was negative, with no evidence of any interatrial shunt. Conclusion(s)/Recommendation(s): No intracardiac source of embolism detected on this transthoracic study. A  transesophageal echocardiogram is recommended to exclude cardiac source of embolism if clinically indicated. FINDINGS  Left Ventricle: Left ventricular ejection fraction, by estimation, is 60 to 65%. The left ventricle has normal function. The left ventricle has no regional wall motion abnormalities. The average left ventricular global longitudinal strain is -21.0 %. The global longitudinal strain is normal. 3D left ventricular ejection fraction analysis performed but not reported based on interpreter judgement due to suboptimal quality. The left ventricular internal cavity size was normal in size. There is no left ventricular hypertrophy. Left ventricular diastolic parameters were normal. Right Ventricle: The right ventricular size is normal. No increase in right ventricular wall thickness. Right ventricular systolic function is normal. There is normal pulmonary artery systolic pressure. The tricuspid regurgitant velocity is 2.14 m/s, and  with an assumed right atrial pressure of 3 mmHg, the estimated right ventricular systolic pressure is 21.3 mmHg. Left Atrium: Left atrial size was normal in size. Right Atrium: Right atrial size was normal in size. Pericardium: There is no evidence of pericardial effusion. Mitral Valve: The mitral valve is normal in structure. No evidence of mitral valve regurgitation. No evidence of mitral valve stenosis. Tricuspid Valve: The tricuspid valve is normal in structure. Tricuspid valve regurgitation is trivial. No evidence of tricuspid stenosis. Aortic Valve: The aortic valve is tricuspid. Aortic valve regurgitation is not visualized. No aortic stenosis is present. Pulmonic Valve: The pulmonic valve was normal in structure. Pulmonic valve regurgitation is not visualized. No evidence of pulmonic stenosis. Aorta: No hemodynamically significant coarctation of the aorta. The aortic root is normal in size and structure. Venous: The inferior vena cava is normal in size with greater than  50% respiratory variability, suggesting right atrial pressure of 3 mmHg. IAS/Shunts: No atrial level shunt detected by color flow Doppler. Agitated saline contrast was given intravenously to evaluate for intracardiac shunting. Agitated saline contrast bubble study was negative, with no evidence of any interatrial shunt.  LEFT VENTRICLE PLAX 2D LVOT diam:     1.90 cm  Diastology LV SV:         58  LV e' medial:    9.03 cm/s LV SV Index:   30       LV E/e' medial:  6.0 LVOT Area:     2.84 cm LV e' lateral:   16.60 cm/s                         LV E/e' lateral: 3.3                          2D Longitudinal Strain                         2D Strain GLS Avg:     -21.0 %                          3D Volume EF                         LV 3D EF:    59.00 %                         LV 3D EDV:   97400.00 mm                         LV 3D ESV:   40000.00 mm                         LV 3D SV:    57400.00 mm RIGHT VENTRICLE RV S prime:     12.60 cm/s TAPSE (M-mode): 1.8 cm LEFT ATRIUM             Index       RIGHT ATRIUM           Index LA Vol (A2C):   30.8 ml 15.67 ml/m RA Area:     11.30 cm LA Vol (A4C):   28.7 ml 14.60 ml/m RA Volume:   23.30 ml  11.86 ml/m LA Biplane Vol: 31.2 ml 15.88 ml/m  AORTIC VALVE LVOT Vmax:   129.00 cm/s LVOT Vmean:  84.600 cm/s LVOT VTI:    0.206 m  AORTA Ao Root diam: 3.00 cm Ao Asc diam:  2.70 cm MITRAL VALVE               TRICUSPID VALVE MV Area (PHT): 3.08 cm    TR Peak grad:   18.3 mmHg MV Decel Time: 246 msec    TR Vmax:        214.00 cm/s MV E velocity: 54.20 cm/s MV A velocity: 48.30 cm/s  SHUNTS MV E/A ratio:  1.12        Systemic VTI:  0.21 m                            Systemic Diam: 1.90 cm Weston Brass MD Electronically signed by Weston Brass MD Signature Date/Time: 05/24/2020/4:52:49 PM    Final (Updated)    VAS [place] LOWER EXTREMITY VENOUS (DVT)  Result Date: 05/24/2020  Lower Venous DVT Study Patient Name:  LARIYA KINZIE  Date of Exam:   05/24/2020 Medical Rec #: [id number]       Accession #:    [id number] Date of Birth: January 16, 1980      Patient Gender: F Patient Age:  89W Exam Location:  [facility] Procedure:      VAS [place] LOWER EXTREMITY VENOUS (DVT) Referring Phys: [id number] JINDONG XU --------------------------------------------------------------------------------  Indications: Stroke.  Comparison Study: no prior Performing Technologist: Blanch Media RVS  Examination Guidelines: A complete evaluation includes B-mode imaging, spectral Doppler, color Doppler, and power Doppler as needed of all accessible portions of each vessel. Bilateral testing is considered an integral part of a complete examination. Limited examinations for reoccurring indications may be performed as noted. The reflux portion of the exam is performed with the patient in reverse Trendelenburg.  +---------+---------------+---------+-----------+----------+-------------------+ RIGHT    CompressibilityPhasicitySpontaneityPropertiesThrombus Aging      +---------+---------------+---------+-----------+----------+-------------------+ CFV      Full           Yes      Yes                                      +---------+---------------+---------+-----------+----------+-------------------+ SFJ      Full                                                             +---------+---------------+---------+-----------+----------+-------------------+ FV Prox  Full                                                             +---------+---------------+---------+-----------+----------+-------------------+ FV Mid   Full                                                             +---------+---------------+---------+-----------+----------+-------------------+ FV DistalFull                                                             +---------+---------------+---------+-----------+----------+-------------------+ PFV      Full                                                              +---------+---------------+---------+-----------+----------+-------------------+ POP      Full           Yes      Yes                                      +---------+---------------+---------+-----------+----------+-------------------+ PTV      Full                                                             +---------+---------------+---------+-----------+----------+-------------------+  PERO                                                  Not well visualized +---------+---------------+---------+-----------+----------+-------------------+   +---------+---------------+---------+-----------+----------+--------------+ LEFT     CompressibilityPhasicitySpontaneityPropertiesThrombus Aging +---------+---------------+---------+-----------+----------+--------------+ CFV      Full           Yes      Yes                                 +---------+---------------+---------+-----------+----------+--------------+ SFJ      Full                                                        +---------+---------------+---------+-----------+----------+--------------+ FV Prox  Full                                                        +---------+---------------+---------+-----------+----------+--------------+ FV Mid   Full                                                        +---------+---------------+---------+-----------+----------+--------------+ FV DistalFull                                                        +---------+---------------+---------+-----------+----------+--------------+ PFV      Full                                                        +---------+---------------+---------+-----------+----------+--------------+ POP      Full           Yes      Yes                                 +---------+---------------+---------+-----------+----------+--------------+ PTV      Full                                                         +---------+---------------+---------+-----------+----------+--------------+ PERO     Full                                                        +---------+---------------+---------+-----------+----------+--------------+  Summary: BILATERAL: - No evidence of deep vein thrombosis seen in the lower extremities, bilaterally. -No evidence of popliteal cyst, bilaterally.   *See table(s) above for measurements and observations. Electronically signed by Lemar Livings MD on 05/24/2020 at 3:36:31 PM.    Final     Scheduled Meds: . aspirin EC  81 mg Oral Daily  . atorvastatin  10 mg Oral Daily  . busPIRone  15 mg Oral TID  . insulin aspart  0-9 Units Subcutaneous TID WC  . pantoprazole  20 mg Oral Daily  . QUEtiapine  100 mg Oral QHS  . QUEtiapine  25 mg Oral BID  . valACYclovir  1,000 mg Oral Daily   Continuous Infusions:   LOS: 0 days   Rickey Barbara, MD [facility] Pager On Amion  If 7PM-7AM, please contact night-coverage 05/24/2020, 5:25 PM

## 2020-05-24 NOTE — Plan of Care (Signed)
  Problem: Education: Goal: Knowledge of disease or condition will improve Outcome: Progressing Goal: Knowledge of secondary prevention will improve Outcome: Progressing Goal: Knowledge of patient specific risk factors addressed and post discharge goals established will improve Outcome: Progressing   Problem: Education: Goal: Knowledge of General Education information will improve Description: Including pain rating scale, medication(s)/side effects and non-pharmacologic comfort measures Outcome: Progressing   Problem: Health Behavior/Discharge Planning: Goal: Ability to manage health-related needs will improve Outcome: Progressing   Problem: Clinical Measurements: Goal: Ability to maintain clinical measurements within normal limits will improve Outcome: Progressing Goal: Will remain free from infection Outcome: Progressing Goal: Diagnostic test results will improve Outcome: Progressing Goal: Respiratory complications will improve Outcome: Progressing Goal: Cardiovascular complication will be avoided Outcome: Progressing   Problem: Activity: Goal: Risk for activity intolerance will decrease Outcome: Progressing   Problem: Nutrition: Goal: Adequate nutrition will be maintained Outcome: Progressing   Problem: Coping: Goal: Level of anxiety will decrease Outcome: Progressing   Problem: Elimination: Goal: Will not experience complications related to bowel motility Outcome: Progressing Goal: Will not experience complications related to urinary retention Outcome: Progressing   Problem: Pain Managment: Goal: General experience of comfort will improve Outcome: Progressing   Problem: Safety: Goal: Ability to remain free from injury will improve Outcome: Progressing   Problem: Skin Integrity: Goal: Risk for impaired skin integrity will decrease Outcome: Progressing   

## 2020-05-24 NOTE — Progress Notes (Signed)
    [facility] has been requested to perform a transesophageal echocardiogram on 05/24/2020 for stroke.  After careful review of history and examination, the risks and benefits of transesophageal echocardiogram have been explained including risks of esophageal damage, perforation (1:10,000 risk), bleeding, pharyngeal hematoma as well as other potential complications associated with conscious sedation including aspiration, arrhythmia, respiratory failure and death. Alternatives to treatment were discussed, questions were answered. Patient is willing to proceed.   40 yo female presented with MCA stroke. Pending TTE and TEE. Normal red blood cell count and platelet. Vital stable.   Azalee Course, PA-C 05/24/2020 4:05 PM

## 2020-05-24 NOTE — Consult Note (Addendum)
Neurology Consultation Reason for Consult: Left hand weakness, stroke on MRI Requesting Physician: Mitzi Hansen  CC: Left hand weakness History is obtained from: Patient and chart review  HPI: Sarah Evans is a 40 y.o. 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  She reports she was in her usual state of health on 5/17, had been eating and using both of her hands and normally, when she suddenly began to have trouble using her left hand, with associated numbness.  Since the onset of her weakness her symptoms have been very gradually and steadily improving.  She denies any shaking or involuntary jerking of the hand except when she yawns it will move.  She denies any recent fevers, chills, sweats that she knows she had a herpetic appearing rash on her right cheek (a single lesion below her right eye laterally), which resolved with topical ointment.  She notes that since her diagnosis of prediabetes in March she has lost 15 pounds through diet and exercise as well as having quit smoking.  Her last menstrual period started 3 days ago.  She did have an HSV outbreak on the 16th as well for which she started valacyclovir, but she reports she is not currently sexually active.  She has had a more chronic rash on her legs that is improved with clobetasol cream.  She denies any palpitations, shortness of breath, other focal neurological signs recently or in the past  LKW: 11 AM 5/17 tPA given?: No, due to out of the window Premorbid modified rankin scale:      0 - No symptoms.  ROS: All other review of systems was negative except as noted in the HPI.   Past Medical History:  Diagnosis Date  . Bipolar 1 disorder ([place])   . Herpes simplex virus (HSV) infection    dx 2001  . Polycystic ovary disease   . Schizophrenia ([place])     Family History   Problem Relation Age of Onset  . Diabetes Maternal Grandmother   . Bipolar disorder Mother   . Hypertension Neg Hx   . Heart disease Neg Hx   . Cancer Neg Hx    Social History:  reports that she quit smoking about 3 weeks ago. Her smoking use included cigarettes. She has a 3.75 pack-year smoking history. She has never used smokeless tobacco. She reports that she does not drink alcohol and does not use drugs.  She believes her marijuana positive urine sample may be secondary to bystander inhalation at a party   Exam: Current vital signs: BP 111/66 (BP Location: Left Arm)   Pulse 70   Temp 98.2 F (36.8 C) (Oral)   Resp 18   Ht _0  (1.702 m)   Wt 84.8 kg   LMP 05/21/2020   SpO2 98%   BMI 29.29 kg/m  Vital signs in last 24 hours: Temp:  [98.2 F (36.8 C)] 98.2 F (36.8 C) (05/19 0000) Pulse Rate:  [56-126] 70 (05/19 0000) Resp:  [12-24] 18 (05/19 0000) BP: (99-138)/(58-95) 111/66 (05/19 0000) SpO2:  [94 %-100 %] 98 % (05/19 0000) Weight:  [84.8 kg] 84.8 kg (05/18 0532)   Physical Exam  Constitutional: Appears well-developed and well-nourished.  Psych: Affect appropriate to situation, calm and cooperative, appropriately mildly anxious Eyes: No scleral injection HENT: No oropharyngeal obstruction.  MSK: no joint deformities.  Cardiovascular: Normal rate and regular rhythm.  Respiratory: Effort normal, non-labored breathing  GI: Soft.  No distension. There is no tenderness.  Skin: Warm dry and intact visible skin  Neuro: Mental Status: Patient is awake, alert, oriented to person, place, month, year, and situation. Patient is able to give a clear and coherent history. No signs of aphasia or neglect Cranial Nerves: II: Visual Fields are full. Pupils are equal, round, and reactive to light.   III,IV, VI: EOMI without ptosis or diploplia.  V: Facial sensation is symmetric to temperature VII: Facial movement is symmetric.  VIII: hearing is intact to voice X: Uvula  elevates symmetrically XI: Shoulder shrug is symmetric. XII: tongue is midline without atrophy or fasciculations.  Motor: Tone is normal. Bulk is normal. 5/5 strength was present in all four extremities except mild left deltoid weakness 4/5, finger extension weakness 4/5 finger flexion weakness 4/5 Sensory: Sensation is symmetric to light touch and temperature in the arms and legs Deep Tendon Reflexes: 2+ and symmetric in the biceps and patellae.  Though she does have some trouble relaxing Plantars: Toes are mute bilaterally.  Cerebellar: FNF and HKS are intact bilaterally  NIHSS total 1 for slight sensory change in the left upper extremity, the patient's weakness is not significant enough to score on stroke scale  I have reviewed labs in epic and the results pertinent to this consultation are: A1c 6.1% Lab Results  Component Value Date   CHOL 143 05/24/2020   HDL 52 05/24/2020   LDLCALC 63 05/24/2020   TRIG 142 05/24/2020   CHOLHDL 2.8 05/24/2020    Beta-hCG negative UDS positive for THC, UA negative for infection SARS-CoV-2/influenza respiratory panel negative  I have reviewed the images obtained:  MRI brain with patchy embolic appearing stroke in the R MCA and PCA territories    Impression: 40 year old presenting with embolic appearing stroke minimal vascular risk factors (prediabetes, significant smoking history, oral contraceptive use  Recommendations:  # Right MCA/PCA territory embolic stroke - Stroke labs ESR, RPR, HgbA1c, fasting lipid panel, hypercoagulability panel, ANA, Sjogren's panel given young age - MRI brain completed and personally reviewed as above - MRA of the brain without contrast and MRA neck w/wo pending - Frequent neuro checks - Echocardiogram complete with bubble study given patient is young enough to potentially benefit from PFO closure - Atorvastatin not indicated given LDL is less than 70  - Prophylactic therapy-Antiplatelet med: Aspirin -  dose 35m PO or 308mPR, followed by 81 mg daily - Consider Plavix 300 mg load with 75 mg daily for 21 - 90 day course, will hold for now in case LP is needed if the remainder of the work-up remains unrevealing - Risk factor modification, diet, exercise, congratulations on smoking cessation, cessation of OCP use, marijuana cessation, diet, exercise, medication adherence - Telemetry monitoring; 30 day event monitor on discharge if no arrythmias captured  - Blood pressure goal   - Normotension - PT consult, OT consult, Speech consult - Stroke team to follow  SrLesleigh NoeD-PhD [facility] [phone] 7 PM to 7 AM, outside of these hours please call Neurologist on call as listed on Amion.

## 2020-05-24 NOTE — Progress Notes (Signed)
PT Cancellation Note  Patient Details Name: Sarah Evans MRN: [id number] DOB: [birth year]   Cancelled Treatment:    Reason Eval/Treat Not Completed: Patient at procedure or test/unavailable  Per chart, pt in CT. Will reattempt later today   Jerolyn Center, PT Pager [phone]  Zena Amos 05/24/2020, 8:51 AM

## 2020-05-24 NOTE — Plan of Care (Signed)
Admitted pt from [facility] ED. No neuro defecits noted except slight lef hand weakness. Safety precautions maintained.

## 2020-05-24 NOTE — Procedures (Signed)
Procedure Note: Lumbar puncture  Indications: assess for CNS pathology   Operator: Marvel Plan, MD, PhD  Others present: Delila NP, beside RN  Indications, risks, and benefits explained to patient / surrogate decision maker and informed consent obtained. Time-out was performed, with all individuals present agreeing on the procedure to be performed, the site of procedure, and the patient identity.  Patient positioned, prepped and draped in usual sterile fashion. L3-4 space located using bilateral iliac crests as landmarks. 1% Lidocaine without epinephrine was used to anesthetize the area. An 20G spinal needle was introduced into the sub- arachnoid space. Stylet was removed with appropriate fluid return. Needle removed after adequate fluid collected. Blood loss was minimal. A dry guaze dressing was placed over insertion site. Patient tolerated the procedure well and no complications were observed. Spontaneous movement of bilateral extremities were observed after the procedure.  Opening pressure: 13 cm H20 (hips & legs flexed)   Total fluid removed: 10cc  Color of fluid: clear  Sent for: cell count + diff , gram stain, cultures, glucose, protein, crypto antigen, HSV PCR, VDRL  Marvel Plan, MD PhD Stroke Neurology 05/24/2020 4:17 PM

## 2020-05-24 NOTE — Progress Notes (Signed)
Lower extremity venous has been completed.   Preliminary results in CV Proc.   Blanch Media 05/24/2020 10:14 AM

## 2020-05-25 ENCOUNTER — Encounter (HOSPITAL_COMMUNITY): Payer: Self-pay | Admitting: Internal Medicine

## 2020-05-25 ENCOUNTER — Other Ambulatory Visit (HOSPITAL_COMMUNITY): Payer: Self-pay

## 2020-05-25 ENCOUNTER — Inpatient Hospital Stay (HOSPITAL_COMMUNITY): Payer: Self-pay | Admitting: Anesthesiology

## 2020-05-25 ENCOUNTER — Inpatient Hospital Stay (HOSPITAL_COMMUNITY): Payer: Self-pay

## 2020-05-25 ENCOUNTER — Encounter (HOSPITAL_COMMUNITY)
Admission: EM | Disposition: A | Discharge status: Home / Self Care | Payer: Self-pay | Source: Home / Self Care | Attending: Internal Medicine

## 2020-05-25 DIAGNOSIS — I639 Cerebral infarction, unspecified: Secondary | ICD-10-CM

## 2020-05-25 HISTORY — PX: BUBBLE STUDY: SHX6837

## 2020-05-25 HISTORY — PX: TEE WITHOUT CARDIOVERSION: SHX5443

## 2020-05-25 LAB — ANA W/REFLEX IF POSITIVE: Anti Nuclear Antibody (ANA): NEGATIVE

## 2020-05-25 LAB — PROTEIN S ACTIVITY: Protein S Activity: 64 % (ref 63–140)

## 2020-05-25 LAB — PROTEIN C ACTIVITY: Protein C Activity: 145 % (ref 73–180)

## 2020-05-25 LAB — BETA-2-GLYCOPROTEIN I ABS, IGG/M/A
Beta-2 Glyco I IgG: <9 GPI IgG units (ref 0–20)
Beta-2-Glycoprotein I IgA: <9 GPI IgA units (ref 0–25)
Beta-2-Glycoprotein I IgM: <9 GPI IgM units (ref 0–32)

## 2020-05-25 LAB — LUPUS ANTICOAGULANT PANEL
DRVVT: 35.3 s (ref 0.0–47.0)
PTT Lupus Anticoagulant: 30.9 s (ref 0.0–51.9)

## 2020-05-25 LAB — HOMOCYSTEINE: Homocysteine: 9.5 umol/L (ref 0.0–14.5)

## 2020-05-25 LAB — GLUCOSE, CAPILLARY
Glucose-Capillary: 87 mg/dL (ref 70–99)
Glucose-Capillary: 170 mg/dL — ABNORMAL HIGH (ref 70–99)
Glucose-Capillary: 102 mg/dL — ABNORMAL HIGH (ref 70–99)

## 2020-05-25 LAB — PROTEIN C, TOTAL: Protein C, Total: 99 % (ref 60–150)

## 2020-05-25 LAB — PROTEIN S, TOTAL: Protein S Ag, Total: 59 % — ABNORMAL LOW (ref 60–150)

## 2020-05-25 LAB — VDRL, CSF: VDRL Quant, CSF: NONREACTIVE

## 2020-05-25 LAB — SJOGRENS SYNDROME-A EXTRACTABLE NUCLEAR ANTIBODY: SSA (Ro) (ENA) Antibody, IgG: <0.2 AI (ref 0.0–0.9)

## 2020-05-25 LAB — SJOGRENS SYNDROME-B EXTRACTABLE NUCLEAR ANTIBODY: SSB (La) (ENA) Antibody, IgG: <0.2 AI (ref 0.0–0.9)

## 2020-05-25 SURGERY — ECHOCARDIOGRAM, TRANSESOPHAGEAL
Anesthesia: Monitor Anesthesia Care

## 2020-05-25 MED ORDER — SODIUM CHLORIDE 0.9 % IV SOLN: INTRAVENOUS | Status: DC

## 2020-05-25 MED ORDER — PROPOFOL 500 MG/50ML IV EMUL
INTRAVENOUS | Status: DC | PRN
  Administered 2020-05-25: 100 ug/kg/min via INTRAVENOUS

## 2020-05-25 MED ORDER — DEXMEDETOMIDINE (PRECEDEX) IN NS 20 MCG/5ML (4 MCG/ML) IV SYRINGE
PREFILLED_SYRINGE | INTRAVENOUS | Status: DC | PRN
  Administered 2020-05-25: 8 ug via INTRAVENOUS
  Administered 2020-05-25: 4 ug via INTRAVENOUS

## 2020-05-25 MED ORDER — CLOPIDOGREL BISULFATE 75 MG PO TABS
75.0000 mg | ORAL_TABLET | Freq: Every day | ORAL | 0 refills | Status: AC
  Filled 2020-05-25: qty 21, 21d supply, fill #0

## 2020-05-25 MED ORDER — ASPIRIN 81 MG PO TBEC
81.0000 mg | DELAYED_RELEASE_TABLET | Freq: Every day | ORAL | 0 refills | Status: AC
  Filled 2020-05-25: qty 30, 30d supply, fill #0

## 2020-05-25 MED ORDER — LACTATED RINGERS IV SOLN: INTRAVENOUS | Status: DC

## 2020-05-25 MED ORDER — LIDOCAINE 2% (20 MG/ML) 5 ML SYRINGE
INTRAMUSCULAR | Status: DC | PRN
  Administered 2020-05-25: 50 mg via INTRAVENOUS

## 2020-05-25 MED ORDER — PROPOFOL 10 MG/ML IV BOLUS
INTRAVENOUS | Status: DC | PRN
  Administered 2020-05-25: 15 mg via INTRAVENOUS

## 2020-05-25 MED ORDER — BUTAMBEN-TETRACAINE-BENZOCAINE 2-2-14 % EX AERO
INHALATION_SPRAY | CUTANEOUS | Status: DC | PRN
  Administered 2020-05-25: 2 via TOPICAL

## 2020-05-25 NOTE — Progress Notes (Signed)
OT Cancellation Note  Patient Details Name: Sarah Evans MRN: [id number] DOB: 1980/02/06   Cancelled Treatment:    Reason Eval/Treat Not Completed: Patient at procedure or test/ unavailable. OT will continue efforts.   Kallie Edward OTR/L Supplemental OT, Department of rehab services [phone]  Sansa Alkema R H. 05/25/2020, 8:31 AM

## 2020-05-25 NOTE — Discharge Instructions (Signed)
Take aspirin and plavix together for 3 weeks, then afterwards, aspirin alone

## 2020-05-25 NOTE — Interval H&P Note (Signed)
History and Physical Interval Note:  05/25/2020 8:29 AM  Sarah Evans reported a 10/10 headache today-  d/w Dr. Roda Shutters (neurologist) - he said that she had an LP yesterday and suspects that is the cause - stoke was small and he would not suspect hemorrhagic transformation. He thinks it would be ok to proceed with TEE and will re-evaluate the patient after the procedure.  Sarah Evans  has presented today for surgery, with the diagnosis of STROKE.  The various methods of treatment have been discussed with the patient and family. After consideration of risks, benefits and other options for treatment, the patient has consented to  Procedure(s): TRANSESOPHAGEAL ECHOCARDIOGRAM (TEE) (N/A) as a surgical intervention.  The patient's history has been reviewed, patient examined, no change in status, stable for surgery.  I have reviewed the patient's chart and labs.  Questions were answered to the patient's satisfaction.     Chrystie Nose

## 2020-05-25 NOTE — Progress Notes (Signed)
  Echocardiogram Echocardiogram Transesophageal has been performed.  Sarah Evans F 05/25/2020, 9:34 AM

## 2020-05-25 NOTE — Progress Notes (Addendum)
Occupational Therapy Treatment Patient Details Name: Sarah Evans MRN: [id number] DOB: [birth year] Today's Date: 05/25/2020    History of present illness 40 y.o. female presenting with L hand weakness/numbness. Imaging (+) multiple cortical/subcortical lesions within R hemisphere concerning for subacute infarcts wtih scattered petechiae. PMHx significant for prediabetes, HLD, PTSD, schizoaffective disorder, bipolar disorder, depression/anxiety, HSV, and PCOS.   OT comments  OT treatment session with focus on LUE NMR. Patient reporting slightly improved function in LUE in comparison to yesterday. Patient also reports completing LUE NMR HEP x2 yesterday. Patient able to perform LUE HEP with written handout and min cues for accuracy. All patient questions answered to patient satisfaction. Education provided on incorporation of non-dominant and affected LUE into ADL tasks. Patient expressed verbal understanding. Patient would benefit from continued actue OT services in prep for safe d/c home.    Follow Up Recommendations  Outpatient OT;Other (comment) (Outpatient neuro)   Addendum: Outpatient neuro added to reflect patient's needs  Equipment Recommendations  None recommended by OT    Recommendations for Other Services      Precautions / Restrictions Precautions Precautions: None Restrictions Weight Bearing Restrictions: No       Mobility Bed Mobility Overal bed mobility: Independent                  Transfers Overall transfer level: Independent                    Balance Overall balance assessment: No apparent balance deficits (not formally assessed)                                         ADL either performed or assessed with clinical judgement   ADL                                               Vision   Vision Assessment?: No apparent visual deficits   Perception     Praxis      Cognition Arousal/Alertness:  Awake/alert Behavior During Therapy: WFL for tasks assessed/performed Overall Cognitive Status: Within Functional Limits for tasks assessed                                          Exercises Exercises: Hand exercises Hand Exercises Forearm Supination: AROM;Left;10 reps Forearm Pronation: AROM;Left;10 reps Wrist Flexion: AROM;Left;10 reps Wrist Extension: AAROM;Left;10 reps Digit Composite Flexion: AROM;Left;10 reps Composite Extension: AROM;Left;10 reps Digit Composite Abduction: AAROM;Left;10 reps Digit Composite Adduction: AAROM;Left;10 reps Digit Lifts: AAROM;Left;10 reps Opposition: AAROM;Left;10 reps   Shoulder Instructions       General Comments Patient reports improvement in L hand function. Reports completing HEP x2 yesterday.    Pertinent Vitals/ Pain       Pain Assessment: No/denies pain  Home Living                                          Prior Functioning/Environment              Frequency  Min 2X/week  Progress Toward Goals  OT Goals(current goals can now be found in the care plan section)  Progress towards OT goals: Progressing toward goals  Acute Rehab OT Goals Patient Stated Goal: To increase function of LUE. OT Goal Formulation: With patient Time For Goal Achievement: 06/07/20 Potential to Achieve Goals: Good ADL Goals Additional ADL Goal #1: Patient will complete ADLs with I and good utilization of LUE. Additional ADL Goal #2: Patient will compelte LUE HEP with I using written handout.  Plan Discharge plan remains appropriate;Frequency remains appropriate    Co-evaluation                 AM-PAC OT "6 Clicks" Daily Activity     Outcome Measure   Help from another person eating meals?: None Help from another person taking care of personal grooming?: None Help from another person toileting, which includes using toliet, bedpan, or urinal?: None Help from another person bathing (including  washing, rinsing, drying)?: A Little Help from another person to put on and taking off regular upper body clothing?: None Help from another person to put on and taking off regular lower body clothing?: A Little 6 Click Score: 22    End of Session    OT Visit Diagnosis: Hemiplegia and hemiparesis Hemiplegia - Right/Left: Left Hemiplegia - dominant/non-dominant: Non-Dominant Hemiplegia - caused by: Cerebral infarction   Activity Tolerance Patient tolerated treatment well   Patient Left in bed;with call bell/phone within reach;with bed alarm set   Nurse Communication          Time: 2831-5176 OT Time Calculation (min): 24 min  Charges: OT General Charges $OT Visit: 1 Visit OT Treatments $Neuromuscular Re-education: 23-37 mins  [name] H. OTR/L Supplemental OT, Department of rehab services [phone]   [name] R H. 05/25/2020, 2:28 PM

## 2020-05-25 NOTE — Progress Notes (Addendum)
STROKE TEAM PROGRESS NOTE   INTERVAL HISTORY No visitors at bedside. TEE performed today. Had a headache early this morning but it has resolved. She feels her left hand weakness is improved today.  We discussed her stroke diagnosis, plan of care and ongoing work up. TEE read is still pending.    Vitals:   05/24/20 1427 05/24/20 1616 05/24/20 2132 05/25/20 0755  BP: 113/72 116/74 (!) 92/57 (!) 100/56  Pulse: 78 63 60 60  Resp: 16 16 18 16   Temp:  98.7 F (37.1 C) 98.7 F (37.1 C) 98 F (36.7 C)  TempSrc:  Oral Oral Oral  SpO2: 99% 99% 97% 99%  Weight:      Height:       CBC:  Recent Labs  Lab 05/23/20 0833 05/24/20 0332  WBC 7.8 6.1  NEUTROABS 5.3  --   HGB 13.5 12.9  HCT 41.9 40.2  MCV 86.6 86.3  PLT 332 313   Basic Metabolic Panel:  Recent Labs  Lab 05/23/20 0833 05/24/20 0332  NA 140 137  K 3.7 3.9  CL 108 109  CO2 22 22  GLUCOSE 93 94  BUN 13 10  CREATININE 0.86 0.99  CALCIUM 9.2 8.5*   Lipid Panel:  Recent Labs  Lab 05/24/20 0332  CHOL 143  TRIG 142  HDL 52  CHOLHDL 2.8  VLDL 28  LDLCALC 63   HgbA1c:  Recent Labs  Lab 05/24/20 0332  HGBA1C 6.1*   Urine Drug Screen:  Recent Labs  Lab 05/23/20 1126  LABOPIA NONE DETECTED  COCAINSCRNUR NONE DETECTED  LABBENZ NONE DETECTED  AMPHETMU NONE DETECTED  THCU POSITIVE*  LABBARB NONE DETECTED    Alcohol Level  Recent Labs  Lab 05/24/20 0332  ETH <10   Beta-hCG negative  IMAGING past 24 hours CT ANGIO HEAD NECK W WO CM  Result Date: 05/24/2020 CLINICAL DATA:  Stroke follow-up. EXAM: CT ANGIOGRAPHY HEAD AND NECK TECHNIQUE: Multidetector CT imaging of the head and neck was performed using the standard protocol during bolus administration of intravenous contrast. Multiplanar CT image reconstructions and MIPs were obtained to evaluate the vascular anatomy. Carotid stenosis measurements (when applicable) are obtained utilizing NASCET criteria, using the distal internal carotid diameter as the  denominator. CONTRAST:  76mL OMNIPAQUE IOHEXOL 350 MG/ML SOLN COMPARISON:  CT and MRI head May 23, 2020. FINDINGS: CTA NECK FINDINGS Aortic arch: Great vessel origins are patent. Right carotid system: No evidence of dissection, stenosis (50% or greater) or occlusion. Left carotid system: No evidence of dissection, stenosis (50% or greater) or occlusion. Vertebral arteries: Left dominant. No evidence of dissection, stenosis (50% or greater) or occlusion. Skeleton: No evidence of acute abnormality on limited assessment. Other neck: No acute abnormality. Upper chest: Metallic foreign body in the right upper lobe. Otherwise, visualized lung apices are clear. Review of the MIP images confirms the above findings CTA HEAD FINDINGS Anterior circulation: No evidence of large vessel occlusion or proximal hemodynamically significant stenosis. While evaluation is limited distally due to venous contamination, suspected high-grade stenosis versus occlusion of a small distal right anterior MCA branch vessel in the region of the right frontal infarct. Posterior circulation: No large vessel occlusion or proximal hemodynamically significant stenosis. Bilateral posterior communicating arteries. None evaluation of the distal PCAs due to venous contamination. Venous sinuses: As permitted by contrast timing, patent. Review of the MIP images confirms the above findings Final report was delayed due to power outage that occurred at the time of dictation. Final report was  discussed with Dr. Roda ShuttersXu at 10:58 PM. IMPRESSION: 1. No large vessel occlusion or evidence of proximal hemodynamically significant stenosis. 2. While evaluation is limited distally due to venous contamination, suspected high-grade stenosis versus occlusion of a small distal right anterior MCA branch vessel in the region of the right frontal infarct. Electronically Signed   By: Feliberto HartsFrederick S Jones MD   On: 05/24/2020 11:09   ECHOCARDIOGRAM COMPLETE BUBBLE STUDY  Result Date:  05/24/2020    ECHOCARDIOGRAM REPORT   Patient Name:   Marshell GarfinkelLATASHA D Sherlin Date of Exam: 05/24/2020 Medical Rec #:  [id number]     Height:       67.0 in Accession #:    [id number]    Weight:       187.0 lb Date of Birth:  11/18/1980     BSA:          1.965 m Patient Age:    40 years      BP:           121/79 mmHg Patient Gender: F             HR:           81 bpm. Exam Location:  Inpatient Procedure: 2D Echo, Color Doppler, Cardiac Doppler, 3D Echo and Saline Contrast            Bubble Study                               MODIFIED REPORT: This report was modified by Weston BrassGayatri Acharya MD on 05/24/2020 due to revision.  Indications:     Stroke 434.91 / I63.  History:         Patient has no prior history of Echocardiogram examinations.                  Risk Factors:Hypertension, Diabetes and Dyslipidemia.  Sonographer:     Leta Junglingiffany Cooper RDCS Referring Phys:  [id number] Gordy CouncilmanSRISHTI L BHAGAT Diagnosing Phys: Weston BrassGayatri Acharya MD IMPRESSIONS  1. Left ventricular ejection fraction, by estimation, is 60 to 65%. The left ventricle has normal function. The left ventricle has no regional wall motion abnormalities. Left ventricular diastolic parameters were normal. The average left ventricular global longitudinal strain is -21.0 %. The global longitudinal strain is normal.  2. Right ventricular systolic function is normal. The right ventricular size is normal. There is normal pulmonary artery systolic pressure. The estimated right ventricular systolic pressure is 21.3 mmHg.  3. The mitral valve is normal in structure. No evidence of mitral valve regurgitation. No evidence of mitral stenosis.  4. The aortic valve is tricuspid. Aortic valve regurgitation is not visualized. No aortic stenosis is present.  5. The inferior vena cava is normal in size with greater than 50% respiratory variability, suggesting right atrial pressure of 3 mmHg.  6. Agitated saline contrast bubble study was negative, with no evidence of any interatrial shunt.  Conclusion(s)/Recommendation(s): No intracardiac source of embolism detected on this transthoracic study. A transesophageal echocardiogram is recommended to exclude cardiac source of embolism if clinically indicated. FINDINGS  Left Ventricle: Left ventricular ejection fraction, by estimation, is 60 to 65%. The left ventricle has normal function. The left ventricle has no regional wall motion abnormalities. The average left ventricular global longitudinal strain is -21.0 %. The global longitudinal strain is normal. 3D left ventricular ejection fraction analysis performed but not reported based on interpreter judgement due to suboptimal quality. The left ventricular internal  cavity size was normal in size. There is no left ventricular hypertrophy. Left ventricular diastolic parameters were normal. Right Ventricle: The right ventricular size is normal. No increase in right ventricular wall thickness. Right ventricular systolic function is normal. There is normal pulmonary artery systolic pressure. The tricuspid regurgitant velocity is 2.14 m/s, and  with an assumed right atrial pressure of 3 mmHg, the estimated right ventricular systolic pressure is 21.3 mmHg. Left Atrium: Left atrial size was normal in size. Right Atrium: Right atrial size was normal in size. Pericardium: There is no evidence of pericardial effusion. Mitral Valve: The mitral valve is normal in structure. No evidence of mitral valve regurgitation. No evidence of mitral valve stenosis. Tricuspid Valve: The tricuspid valve is normal in structure. Tricuspid valve regurgitation is trivial. No evidence of tricuspid stenosis. Aortic Valve: The aortic valve is tricuspid. Aortic valve regurgitation is not visualized. No aortic stenosis is present. Pulmonic Valve: The pulmonic valve was normal in structure. Pulmonic valve regurgitation is not visualized. No evidence of pulmonic stenosis. Aorta: No hemodynamically significant coarctation of the aorta. The aortic  root is normal in size and structure. Venous: The inferior vena cava is normal in size with greater than 50% respiratory variability, suggesting right atrial pressure of 3 mmHg. IAS/Shunts: No atrial level shunt detected by color flow Doppler. Agitated saline contrast was given intravenously to evaluate for intracardiac shunting. Agitated saline contrast bubble study was negative, with no evidence of any interatrial shunt.  LEFT VENTRICLE PLAX 2D LVOT diam:     1.90 cm  Diastology LV SV:         58       LV e' medial:    9.03 cm/s LV SV Index:   30       LV E/e' medial:  6.0 LVOT Area:     2.84 cm LV e' lateral:   16.60 cm/s                         LV E/e' lateral: 3.3                          2D Longitudinal Strain                         2D Strain GLS Avg:     -21.0 %                          3D Volume EF                         LV 3D EF:    59.00 %                         LV 3D EDV:   97400.00 mm                         LV 3D ESV:   40000.00 mm                         LV 3D SV:    57400.00 mm RIGHT VENTRICLE RV S prime:     12.60 cm/s TAPSE (M-mode): 1.8 cm LEFT ATRIUM             Index  RIGHT ATRIUM           Index LA Vol (A2C):   30.8 ml 15.67 ml/m RA Area:     11.30 cm LA Vol (A4C):   28.7 ml 14.60 ml/m RA Volume:   23.30 ml  11.86 ml/m LA Biplane Vol: 31.2 ml 15.88 ml/m  AORTIC VALVE LVOT Vmax:   129.00 cm/s LVOT Vmean:  84.600 cm/s LVOT VTI:    0.206 m  AORTA Ao Root diam: 3.00 cm Ao Asc diam:  2.70 cm MITRAL VALVE               TRICUSPID VALVE MV Area (PHT): 3.08 cm    TR Peak grad:   18.3 mmHg MV Decel Time: 246 msec    TR Vmax:        214.00 cm/s MV E velocity: 54.20 cm/s MV A velocity: 48.30 cm/s  SHUNTS MV E/A ratio:  1.12        Systemic VTI:  0.21 m                            Systemic Diam: 1.90 cm Weston Brass MD Electronically signed by Weston Brass MD Signature Date/Time: 05/24/2020/4:52:49 PM    Final (Updated)    VAS [place] LOWER EXTREMITY VENOUS (DVT)  Result Date:  05/24/2020  Lower Venous DVT Study Patient Name:  LASHAWN BROMWELL  Date of Exam:   05/24/2020 Medical Rec #: [id number]      Accession #:    [id number] Date of Birth: [birth year]      Patient Gender: F Patient Age:   53Y Exam Location:  [facility] Procedure:      VAS [place] LOWER EXTREMITY VENOUS (DVT) Referring Phys: [id number] Marvel Plan --------------------------------------------------------------------------------  Indications: Stroke.  Comparison Study: no prior Performing Technologist: Blanch Media RVS  Examination Guidelines: A complete evaluation includes B-mode imaging, spectral Doppler, color Doppler, and power Doppler as needed of all accessible portions of each vessel. Bilateral testing is considered an integral part of a complete examination. Limited examinations for reoccurring indications may be performed as noted. The reflux portion of the exam is performed with the patient in reverse Trendelenburg.  +---------+---------------+---------+-----------+----------+-------------------+ RIGHT    CompressibilityPhasicitySpontaneityPropertiesThrombus Aging      +---------+---------------+---------+-----------+----------+-------------------+ CFV      Full           Yes      Yes                                      +---------+---------------+---------+-----------+----------+-------------------+ SFJ      Full                                                             +---------+---------------+---------+-----------+----------+-------------------+ FV Prox  Full                                                             +---------+---------------+---------+-----------+----------+-------------------+ FV Mid   Full                                                             +---------+---------------+---------+-----------+----------+-------------------+  FV DistalFull                                                              +---------+---------------+---------+-----------+----------+-------------------+ PFV      Full                                                             +---------+---------------+---------+-----------+----------+-------------------+ POP      Full           Yes      Yes                                      +---------+---------------+---------+-----------+----------+-------------------+ PTV      Full                                                             +---------+---------------+---------+-----------+----------+-------------------+ PERO                                                  Not well visualized +---------+---------------+---------+-----------+----------+-------------------+   +---------+---------------+---------+-----------+----------+--------------+ LEFT     CompressibilityPhasicitySpontaneityPropertiesThrombus Aging +---------+---------------+---------+-----------+----------+--------------+ CFV      Full           Yes      Yes                                 +---------+---------------+---------+-----------+----------+--------------+ SFJ      Full                                                        +---------+---------------+---------+-----------+----------+--------------+ FV Prox  Full                                                        +---------+---------------+---------+-----------+----------+--------------+ FV Mid   Full                                                        +---------+---------------+---------+-----------+----------+--------------+ FV DistalFull                                                        +---------+---------------+---------+-----------+----------+--------------+  PFV      Full                                                        +---------+---------------+---------+-----------+----------+--------------+ POP      Full           Yes      Yes                                  +---------+---------------+---------+-----------+----------+--------------+ PTV      Full                                                        +---------+---------------+---------+-----------+----------+--------------+ PERO     Full                                                        +---------+---------------+---------+-----------+----------+--------------+     Summary: BILATERAL: - No evidence of deep vein thrombosis seen in the lower extremities, bilaterally. -No evidence of popliteal cyst, bilaterally.   *See table(s) above for measurements and observations. Electronically signed by Lemar Livings MD on 05/24/2020 at 3:36:31 PM.    Final    PHYSICAL EXAM Constitutional: Appears well-developed and well-nourished. Lying in bed texting on cell phone.  Psych: Affect appropriate to situation, calm and cooperative, appropriately mildly anxious Eyes: No scleral injection HENT: No oropharyngeal obstruction.  MSK: no joint deformities.  Cardiovascular: Normal rate and regular rhythm.  Respiratory: Effort normal, non-labored breathing Skin: Warm dry. LP site clean, dry and intact.   Neuro: Mental Status: Patient is awake, alert, oriented x4 Patient is able to give a clear and coherent history. No signs of aphasia or neglect Cranial Nerves: II: Visual Fields are full. Pupils are equal, round, and reactive to light.   III,IV, VI: EOMI without ptosis or diploplia.  V: Facial sensation is symmetric to temperature VII: Facial movement is symmetric.  VIII: hearing is intact to voice X: Uvula elevates symmetrically XI: Shoulder shrug is symmetric. XII: tongue is midline without atrophy or fasciculations.  Motor: Tone is normal. Bulk is normal. 5/5 strength was present in all four extremities except mild left deltoid weakness 4/5, finger extension weakness 4/5 finger flexion weakness 4/5 Sensory: Sensation is symmetric to light touch and temperature in the arms and legs Deep Tendon  Reflexes: 2+ and symmetric in the biceps and patellae.  Though she does have some trouble relaxing Plantars: Toes are mute bilaterally.  Cerebellar: FNF and HKS are intact bilaterally  ASSESSMENT/PLAN AGUSTA HACKENBERG is a 40 y.o. 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 She presented with trouble using her left hand noticed while eating, with associated numbness.   Right MCA/PCA territory embolic stroke with unknown etiology with work up underway.   Code Stroke: Abnormal 3 cm area of mixed density in the right middle frontal gyrus.  The noncontrast CT appearance most resembles a recent infarct with petechial hemorrhage  CTA head & neck: No LVO   MRI brain with patchy embolic appearing stroke in the R MCA and PCA territories   2D Echo: EF 60-65%. No thrombus, wall motion abnormality or shunt found.   LDL 63  HgbA1c 6.1  Sed rate wnl  UDS positive for [facility]  Hypercoag labs:  Protein C pending, Protein S, antithrombin III, homcysteine negative, Cardiolipin pending  LP performed 5/19 with CSF labs to rule out HSV encephalitis/vasculitis (cell count 10, cryptococcal antigen negative, gluc 71, protein 24, culture negative)  CSF VDRL and HSV pending  RPR negative  ANA negative, Sjogrens pending  TEE is pending  May need cardiac event monitor for 30 days at discharge  VTE prophylaxis - is recommended    Diet   Diet NPO time specified Except for: Sips with Meds   Continue tele   No AC/AP prior to admission  DAPT Aspirin  with Plavix for 3 weeks and then aspirin alone.  Therapy recommendations: TBD  Disposition:  TBD  BP management  No hx of HTN . Permissive hypertension (OK if < 220/120) but gradually normalize in 5-7 days . Long-term BP goal normotensive  Hyperlipidemia  Home meds: Lipitor  10  LDL 63, at goal < 70  Continue home low dose Lipitor on discharge.   Other Stroke Risk Factors  Former Cigarette smoker, quit 3 weeks ago  Substance abuse - UDS:  THC POSITIVE, Cocaine NONE DETECTED. Patient advised to stop using due to stroke risk.  Overweight,  Body mass index is 29.29 kg/m., BMI >/= 30 associated with increased stroke risk, recommend weight loss, diet and exercise as appropriate   OCP use due to PCOS  Other Active Problems  Schhizoaffective disorder:home regimen per primary team   Hospital day # 1  Delila A Bailey-Modzik, NP-C   ATTENDING NOTE: I reviewed above note and agree with the assessment and plan. Pt was seen and examined.   No acute event overnight, this a.m. before TEE, patient complained of headache 10/10, discussed with Dr. Rennis Golden, low suspicious for hemorrhagic conversion, recommend to continue TEE.  After TEE, patient seen at bedside, patient lying in bed, stated headache has gone.  I also set up patient for more than 5 minutes, no headache reported, not consistent with post LP headache.  TEE per Dr. Rennis Golden no PFO, no cardioembolic source of stroke.  CSF study unremarkable, not consistent with CNS infection or inflammation.  HSV PCR still pending.  No further work-up needed at this time.  Recommend 30-day cardiac event monitoring as outpatient to rule out A. fib, although likely low yield.  Continue DAPT for 3 weeks and then aspirin alone.  Continue home Lipitor low-dose.  Follow-up with OB/GYN for OCP use.  Patient still has left hand weakness, PT/OT recommend outpatient PT/OT.   For detailed assessment and plan, please refer to above as I have made changes wherever appropriate.   Neurology will sign off. Please call with questions. Pt will follow up with stroke clinic Dr. Pearlean Brownie at [facility] in about 4 weeks. Thanks for the consult.   Marvel Plan, MD PhD Stroke Neurology 05/25/2020 7:37 PM

## 2020-05-25 NOTE — CV Procedure (Signed)
TRANSESOPHAGEAL ECHOCARDIOGRAM (TEE) NOTE  INDICATIONS: cryptogenic stroke  PROCEDURE:   Informed consent was obtained prior to the procedure. The risks, benefits and alternatives for the procedure were discussed and the patient comprehended these risks.  Risks include, but are not limited to, cough, sore throat, vomiting, nausea, somnolence, esophageal and stomach trauma or perforation, bleeding, low blood pressure, aspiration, pneumonia, infection, trauma to the teeth and death.    After a procedural time-out, the patient was given propofol per anesthesia for moderate sedation.  The patient's heart rate, blood pressure, and oxygen saturation are monitored continuously during the procedure. The transesophageal probe was inserted in the esophagus and stomach without difficulty and multiple views were obtained.  The patient was kept under observation until the patient left the procedure room.  I was present face-to-face 100% of this time. The patient left the procedure room in stable condition.   Agitated microbubble saline contrast was administered.  COMPLICATIONS:    There were no immediate complications.  Findings:  1. LEFT VENTRICLE: The left ventricular wall thickness is normal.  The left ventricular cavity is normal in size. Wall motion is normal.  LVEF is 60-65%.  2. RIGHT VENTRICLE:  The right ventricle is normal in structure and function without any thrombus or masses.    3. LEFT ATRIUM:  The left atrium is normal in size without any thrombus or masses.  There is spontaneous echo contrast ("smoke") in the left atrium consistent with a low flow state.  4. LEFT ATRIAL APPENDAGE:  The left atrial appendage is free of any thrombus or masses. The appendage has single lobes. Pulse doppler indicates high flow in the appendage.  5. ATRIAL SEPTUM:  The atrial septum appears intact and is free of thrombus and/or masses.  There is no evidence for interatrial shunting by color doppler and  saline microbubble. A few late microbubbles were noted in the left atrium, suggestive of intrapulmonary shunting.  6. RIGHT ATRIUM:  The right atrium is normal in size and function without any thrombus or masses.  7. MITRAL VALVE:  The mitral valve is normal in structure and function with trivial regurgitation.  There were no vegetations or stenosis.  8. AORTIC VALVE:  The aortic valve is trileaflet, normal in structure and function with no regurgitation.  There were no vegetations or stenosis  9. TRICUSPID VALVE:  The tricuspid valve is normal in structure and function with trivial regurgitation.  There were no vegetations or stenosis  10.  PULMONIC VALVE:  The pulmonic valve is normal in structure and function with no regurgitation.  There were no vegetations or stenosis.   11. AORTIC ARCH, ASCENDING AND DESCENDING AORTA:  There was no atherosclerosis of the ascending aorta, aortic arch, or proximal descending aorta.  12. PULMONARY VEINS: Anomalous pulmonary venous return was not noted.  13. PERICARDIUM: The pericardium appeared normal and non-thickened.  There is no pericardial effusion.  IMPRESSION:   1. No LAA thrombus 2. A few late microbubbles noted in the LA, suggestive of small intrapulmonary shunt. 3. No significant valve disease 4. Normal LVEF 60-65%, normal wall motion  RECOMMENDATIONS:    1.  Further work-up per neurology - no clear cardiac cause of stroke.  Time Spent Directly with the Patient:  45 minutes   Chrystie Nose, MD, [facility], FACP  [place]  [facility]  Medical Director of the [facility] Lipid [facility]  Cardiovascular Risk Reduction Clinic Diplomate of the American Board of Clinical Lipidology Attending Cardiologist  Direct  Dial: [phone]  Fax: [phone]  Website:  [email or web address] [facility] 05/25/2020, 9:37 AM

## 2020-05-25 NOTE — Progress Notes (Signed)
Was getting ready to discharge pt. And pt. Said she didn't have a ride home. Talked to Tammy Sours, Child psychotherapist and he is getting an Pharmacist, community ride set up for pt. To get home. Pt. Asked can she get the uber to take her to [facility] where her car is and it was advised that is not a good idea because it is not recommended to be driving, discussed this with MD Rhona Leavens at nursing station and MD agrees that it is not a good idea for her to be driving. Pt. Verbalizes understanding that it is not recommended driving.   AVS documentation given and explained to patient in great detail. Explained to pt. About medications, upcoming doctor appointments, and covered in detail entire discharge paperwork. Pt. Was given the opportunity to express concerns/problems. None were verbalized. Pt. Verbalizes understanding and expresses no questions or concerns.   Pt. Discharged [facility].  Patient discharged in stable condition via uber.

## 2020-05-25 NOTE — Transfer of Care (Signed)
Immediate Anesthesia Transfer of Care Note  Patient: Sarah Evans  Procedure(s) Performed: TRANSESOPHAGEAL ECHOCARDIOGRAM (TEE) (N/A ) BUBBLE STUDY  Patient Location: Endoscopy Unit  Anesthesia Type:MAC  Level of Consciousness: awake, alert  and oriented  Airway & Oxygen Therapy: Patient Spontanous Breathing and Patient connected to nasal cannula oxygen  Post-op Assessment: Report given to RN, Post -op Vital signs reviewed and stable and Patient moving all extremities X 4  Post vital signs: Reviewed and stable  Last Vitals:  Vitals Value Taken Time  BP    Temp    Pulse 81 05/25/20 0924  Resp 14 05/25/20 0924  SpO2 100 % 05/25/20 0924  Vitals shown include unvalidated device data.  Last Pain:  Vitals:   05/25/20 0819  TempSrc: Temporal  PainSc: 10-Worst pain ever      Patients Stated Pain Goal: 3 (76/22/63 3354)  Complications: No complications documented.

## 2020-05-25 NOTE — Discharge Summary (Signed)
Physician Discharge Summary  Sarah Evans TOI:[id number] DOB: 18-Jul-1980 DOA: 05/23/2020  PCP: Storm Frisk, MD  Admit date: 05/23/2020 Discharge date: 05/25/2020  Admitted From: Home Disposition:  Home  Recommendations for Outpatient Follow-up:  1. Follow up with PCP in 1-2 weeks 2. Follow up with Neurology as scheduled 3. Outpatient event monitor recommended by Neurology 4. Neurology to follow up on hypercoagulable panel that remains pending  Discharge Condition:Stable CODE STATUS:Full Diet recommendation: Diabetic, heart healthy   Brief/Interim Summary: 40 y.o.femalewith medical history significant forschizoaffective disorder, polycystic ovarysyndrome, type 2 diabetes mellitus, and hyperlipidemia, now presenting to the emergency department with left hand weakness. Patient reports developing weakness involving the left hand at approximately 11 AM or noon yesterday. She denies any headache, change in vision or hearing, chest pain or palpitations, numbness or tingling, or any other weakness. She has never experienced this previously. Denies any recent fall or trauma.   Discharge Diagnoses:  Principal Problem:   Ischemic stroke [facility]) Active Problems:   Schizoaffective disorder, bipolar type (HCC)   Prediabetes   Mixed hyperlipidemia   Numbness and tingling  1.Ischemic CVA -Presents with left hand weakness since ~noon on 05/22/20 - Imaging was notable for multiple cortical/subcortical lesions within the right hemisphere concerning for subacute infarcts with scattered petechiae -Neurology consulting and is following -2d echo reviewed. Normal LVEF. Bubble study was neg -LP performed by Neurology and TEE was performed, both unremarkable. Discussed with Neurology -Per Neurology, pt is clear for d/c with close outpatient f/u  2.Schizoaffective disorder  -Continued Seroquel, Buspar, and Prazosin -Seems stable at this time  3.Type II DM -A1c of 6.1 -  continued on SSI coverage  4.Hyperlipidemia -Pt is continued on Lipitor 10 mg  -Lipid panel reviewed. LDL of 63   Discharge Instructions  Discharge Instructions    Ambulatory referral to Neurology   Complete by: As directed    Follow up with Dr. Pearlean Brownie at [facility] in 4 weeks. Too complicated for NP to follow. Thanks.   Ambulatory referral to Occupational Therapy   Complete by: As directed      Allergies as of 05/25/2020   No Known Allergies     Medication List    STOP taking these medications   spironolactone 100 MG tablet Commonly known as: ALDACTONE   tiZANidine 4 MG tablet Commonly known as: Zanaflex     TAKE these medications   Aspirin Low Dose 81 MG EC tablet Generic drug: aspirin Take 1 tablet (81 mg total) by mouth daily. Swallow whole.   atorvastatin 10 MG tablet Commonly known as: LIPITOR Take 1 tablet (10 mg total) by mouth daily.   busPIRone 15 MG tablet Commonly known as: BUSPAR Take 1 tablet (15 mg total) by mouth 3 (three) times daily.   clobetasol cream 0.05 % Commonly known as: TEMOVATE Apply 1 application topically 2 (two) times daily.   clopidogrel 75 MG tablet Commonly known as: PLAVIX Take 1 tablet (75 mg total) by mouth daily for 21 days.   cyclobenzaprine 10 MG tablet Commonly known as: FLEXERIL Take 10 mg by mouth daily as needed for muscle spasms.   metFORMIN 500 MG tablet Commonly known as: GLUCOPHAGE Take 1 tablet (500 mg total) by mouth daily with breakfast.   multivitamin with minerals Tabs tablet Take 1 tablet by mouth daily.   norgestimate-ethinyl estradiol 0.25-35 MG-MCG tablet Commonly known as: Mili Take 1 tablet by mouth daily.   pantoprazole 20 MG tablet Commonly known as: Protonix Take 1 tablet (20 mg  total) by mouth daily.   polyethylene glycol powder 17 GM/SCOOP powder Commonly known as: GLYCOLAX/MIRALAX Take 17 g by mouth 2 (two) times daily as needed. What changed: reasons to take this   prazosin 1 MG  capsule Commonly known as: MINIPRESS Take 1 capsule (1 mg total) by mouth at bedtime. What changed:   when to take this  reasons to take this   QUEtiapine 100 MG tablet Commonly known as: SEROQUEL Take 1 tablet (100 mg total) by mouth at bedtime.   QUEtiapine 25 MG tablet Commonly known as: SEROquel Take 1 tablet (25 mg total) by mouth 2 (two) times daily.   valACYclovir 1000 MG tablet Commonly known as: VALTREX Take 1 tablet (1,000 mg total) by mouth daily.       Follow-up Information    Department of Social Services. Call.   Why: To follow up on applying for Medicaid.  Contact information:  [phone] [address], [place], [place] [address]       Micki Riley, MD. Schedule an appointment as soon as possible for a visit in 4 week(s).   Specialties: Neurology, Radiology Contact information: [address] [place] [address] [phone]        Storm Frisk, MD. Schedule an appointment as soon as possible for a visit in 2 week(s).   Specialty: Pulmonary Disease Contact information: [address] [place] [address] [phone]        [facility] .   Specialty: Cardiology Contact information: [address] [id number] Wilhe[place] [address] [phone]             No Known Allergies  Consultations:  Neurology  Cardiology  Procedures/Studies: CT ANGIO HEAD NECK W WO CM  Result Date: 05/24/2020 CLINICAL DATA:  Stroke follow-up. EXAM: CT ANGIOGRAPHY HEAD AND NECK TECHNIQUE: Multidetector CT imaging of the head and neck was performed using the standard protocol during bolus administration of intravenous contrast. Multiplanar CT image reconstructions and MIPs were obtained to evaluate the vascular anatomy. Carotid stenosis measurements (when applicable) are obtained utilizing NASCET criteria, using the distal internal carotid diameter as the denominator. CONTRAST:  50mL  OMNIPAQUE IOHEXOL 350 MG/ML SOLN COMPARISON:  CT and MRI head May 23, 2020. FINDINGS: CTA NECK FINDINGS Aortic arch: Great vessel origins are patent. Right carotid system: No evidence of dissection, stenosis (50% or greater) or occlusion. Left carotid system: No evidence of dissection, stenosis (50% or greater) or occlusion. Vertebral arteries: Left dominant. No evidence of dissection, stenosis (50% or greater) or occlusion. Skeleton: No evidence of acute abnormality on limited assessment. Other neck: No acute abnormality. Upper chest: Metallic foreign body in the right upper lobe. Otherwise, visualized lung apices are clear. Review of the MIP images confirms the above findings CTA HEAD FINDINGS Anterior circulation: No evidence of large vessel occlusion or proximal hemodynamically significant stenosis. While evaluation is limited distally due to venous contamination, suspected high-grade stenosis versus occlusion of a small distal right anterior MCA branch vessel in the region of the right frontal infarct. Posterior circulation: No large vessel occlusion or proximal hemodynamically significant stenosis. Bilateral posterior communicating arteries. None evaluation of the distal PCAs due to venous contamination. Venous sinuses: As permitted by contrast timing, patent. Review of the MIP images confirms the above findings Final report was delayed due to power outage that occurred at the time of dictation. Final report was discussed with Dr. Roda Shutters at 10:58 PM. IMPRESSION: 1. No large vessel occlusion  or evidence of proximal hemodynamically significant stenosis. 2. While evaluation is limited distally due to venous contamination, suspected high-grade stenosis versus occlusion of a small distal right anterior MCA branch vessel in the region of the right frontal infarct. Electronically Signed   By: Feliberto Harts MD   On: 05/24/2020 11:09   CT HEAD WO CONTRAST  Result Date: 05/23/2020 CLINICAL DATA:  40 year old female  with dizziness and left hand weakness since yesterday. EXAM: CT HEAD WITHOUT CONTRAST TECHNIQUE: Contiguous axial images were obtained from the base of the skull through the vertex without intravenous contrast. COMPARISON:  None. FINDINGS: Brain: Abnormal roughly 3 cm area of loss gray-white matter differentiation in the anterior right middle frontal gyrus near the frontal operculum (series 2, image 18 and series 5, image 22). Heterogeneous density there raising the possibility of petechial hemorrhage. No significant mass effect. Elsewhere gray-white matter differentiation appears to be normal. No midline shift, ventriculomegaly. No other acute or chronic cortically based infarct. Vascular: No suspicious intracranial vascular hyperdensity. Skull: Negative. Sinuses/Orbits: Visualized paranasal sinuses and mastoids are clear. Tympanic cavities appear clear. Other: Visualized orbits and scalp soft tissues are within normal limits. IMPRESSION: Abnormal 3 cm area of mixed density in the right middle frontal gyrus. The noncontrast CT appearance most resembles a recent infarct with petechial hemorrhage. No significant mass effect. But other differential considerations include tumor and sequelae of a vascular malformation. Recommend Brain MRI without and with contrast to further characterize. Electronically Signed   By: Odessa Fleming M.D.   On: 05/23/2020 09:37   MR Brain W and Wo Contrast  Result Date: 05/23/2020 CLINICAL DATA:  Abnormal head CT.  Left upper extremity weakness. EXAM: MRI HEAD WITHOUT AND WITH CONTRAST TECHNIQUE: Multiplanar, multiecho pulse sequences of the brain and surrounding structures were obtained without and with intravenous contrast. CONTRAST:  8.3mL GADAVIST GADOBUTROL 1 MMOL/ML IV SOLN COMPARISON:  None. FINDINGS: Brain: There is multifocal abnormal diffusion restriction within the right hemisphere, affecting the right middle and precentral gyri and the posterior right parietal lobe and right  occipital lobe. Petechial blood products associated with the right frontal lesion. There is abnormal leptomeningeal contrast enhancement associated with the diffusion restricting lesions. Mild edema at the lesion sites but otherwise normal white matter signal. The midline structures are normal. Vascular: Major flow voids are preserved. Skull and upper cervical spine: Normal calvarium and skull base. Visualized upper cervical spine and soft tissues are normal. Sinuses/Orbits:No paranasal sinus fluid levels or advanced mucosal thickening. No mastoid or middle ear effusion. Normal orbits. IMPRESSION: 1. Multiple diffusion restricting cortical/subcortical lesions within the right hemisphere with overlying leptomeningeal contrast enhancement, most consistent with subacute infarcts. One of the lesions directly affects the hand region of the motor strip. 2. Petechial blood products associated with the right frontal lesion site. Heidelberg classification 1a: HI1, scattered small petechiae, no mass effect. Electronically Signed   By: Deatra Robinson M.D.   On: 05/23/2020 19:22   ECHO TEE  Result Date: 05/25/2020    TRANSESOPHOGEAL ECHO REPORT   Patient Name:   Sarah Evans Date of Exam: 05/25/2020 Medical Rec #:  [id number]     Height:       67.0 in Accession #:    [id number]    Weight:       186.9 lb Date of Birth:  1980/12/14     BSA:          1.965 m Patient Age:    69 years  BP:           118/82 mmHg Patient Gender: F             HR:           61 bpm. Exam Location:  Inpatient Procedure: 2D Echo, Cardiac Doppler, Color Doppler and Saline Contrast Bubble            Study Indications:     Stroke  History:         Patient has no prior history of Echocardiogram examinations.  Sonographer:     Roosvelt Maser RDCS Referring Phys:  [id number] HAO MENG Diagnosing Phys: Zoila Shutter MD PROCEDURE: The transesophogeal probe was passed without difficulty through the esophogus of the patient. Local oropharyngeal anesthetic was  provided with Cetacaine. Sedation performed by different physician. The patient was monitored while under deep sedation. The patient developed no complications during the procedure. IMPRESSIONS  1. Left ventricular ejection fraction, by estimation, is 60 to 65%. The left ventricle has normal function.  2. Right ventricular systolic function is normal. The right ventricular size is normal.  3. No left atrial/left atrial appendage thrombus was detected.  4. The mitral valve is grossly normal. Trivial mitral valve regurgitation.  5. The aortic valve is tricuspid. Aortic valve regurgitation is not visualized.  6. Evidence of atrial level shunting detected by color flow Doppler. Agitated saline contrast bubble study was positive with shunting observed after >6 cardiac cycles suggestive of intrapulmonary shunting. Conclusion(s)/Recommendation(s): No LA/LAA thrombus identified. Negative bubble study for interatrial shunt. No intracardiac source of embolism detected on this on this transesophageal echocardiogram. FINDINGS  Left Ventricle: Left ventricular ejection fraction, by estimation, is 60 to 65%. The left ventricle has normal function. The left ventricular internal cavity size was normal in size. Right Ventricle: The right ventricular size is normal. No increase in right ventricular wall thickness. Right ventricular systolic function is normal. Left Atrium: Left atrial size was normal in size. Spontaneous echo contrast was present. No left atrial/left atrial appendage thrombus was detected. Right Atrium: Right atrial size was normal in size. Pericardium: There is no evidence of pericardial effusion. Mitral Valve: The mitral valve is grossly normal. Trivial mitral valve regurgitation. Tricuspid Valve: The tricuspid valve is grossly normal. Tricuspid valve regurgitation is trivial. Aortic Valve: The aortic valve is tricuspid. Aortic valve regurgitation is not visualized. Pulmonic Valve: The pulmonic valve was grossly  normal. Pulmonic valve regurgitation is not visualized. Aorta: The aortic root and ascending aorta are structurally normal, with no evidence of dilitation. IAS/Shunts: Evidence of atrial level shunting detected by color flow Doppler. Agitated saline contrast was given intravenously to evaluate for intracardiac shunting. Agitated saline contrast bubble study was positive with shunting observed after >6 cardiac cycles suggestive of intrapulmonary shunting. Zoila Shutter MD Electronically signed by Zoila Shutter MD Signature Date/Time: 05/25/2020/3:32:22 PM    Final    ECHOCARDIOGRAM COMPLETE BUBBLE STUDY  Result Date: 05/24/2020    ECHOCARDIOGRAM REPORT   Patient Name:   Sarah Evans Date of Exam: 05/24/2020 Medical Rec #:  [id number]     Height:       67.0 in Accession #:    [id number]    Weight:       187.0 lb Date of Birth:  [birth year]     BSA:          1.965 m Patient Age:    40 years      BP:  121/79 mmHg Patient Gender: F             HR:           81 bpm. Exam Location:  Inpatient Procedure: 2D Echo, Color Doppler, Cardiac Doppler, 3D Echo and Saline Contrast            Bubble Study                               MODIFIED REPORT: This report was modified by Weston Brass MD on 05/24/2020 due to revision.  Indications:     Stroke 434.91 / I63.  History:         Patient has no prior history of Echocardiogram examinations.                  Risk Factors:Hypertension, Diabetes and Dyslipidemia.  Sonographer:     Leta Jungling RDCS Referring Phys:  [id number] Gordy Councilman Diagnosing Phys: Weston Brass MD IMPRESSIONS  1. Left ventricular ejection fraction, by estimation, is 60 to 65%. The left ventricle has normal function. The left ventricle has no regional wall motion abnormalities. Left ventricular diastolic parameters were normal. The average left ventricular global longitudinal strain is -21.0 %. The global longitudinal strain is normal.  2. Right ventricular systolic function is normal. The right  ventricular size is normal. There is normal pulmonary artery systolic pressure. The estimated right ventricular systolic pressure is 21.3 mmHg.  3. The mitral valve is normal in structure. No evidence of mitral valve regurgitation. No evidence of mitral stenosis.  4. The aortic valve is tricuspid. Aortic valve regurgitation is not visualized. No aortic stenosis is present.  5. The inferior vena cava is normal in size with greater than 50% respiratory variability, suggesting right atrial pressure of 3 mmHg.  6. Agitated saline contrast bubble study was negative, with no evidence of any interatrial shunt. Conclusion(s)/Recommendation(s): No intracardiac source of embolism detected on this transthoracic study. A transesophageal echocardiogram is recommended to exclude cardiac source of embolism if clinically indicated. FINDINGS  Left Ventricle: Left ventricular ejection fraction, by estimation, is 60 to 65%. The left ventricle has normal function. The left ventricle has no regional wall motion abnormalities. The average left ventricular global longitudinal strain is -21.0 %. The global longitudinal strain is normal. 3D left ventricular ejection fraction analysis performed but not reported based on interpreter judgement due to suboptimal quality. The left ventricular internal cavity size was normal in size. There is no left ventricular hypertrophy. Left ventricular diastolic parameters were normal. Right Ventricle: The right ventricular size is normal. No increase in right ventricular wall thickness. Right ventricular systolic function is normal. There is normal pulmonary artery systolic pressure. The tricuspid regurgitant velocity is 2.14 m/s, and  with an assumed right atrial pressure of 3 mmHg, the estimated right ventricular systolic pressure is 21.3 mmHg. Left Atrium: Left atrial size was normal in size. Right Atrium: Right atrial size was normal in size. Pericardium: There is no evidence of pericardial effusion.  Mitral Valve: The mitral valve is normal in structure. No evidence of mitral valve regurgitation. No evidence of mitral valve stenosis. Tricuspid Valve: The tricuspid valve is normal in structure. Tricuspid valve regurgitation is trivial. No evidence of tricuspid stenosis. Aortic Valve: The aortic valve is tricuspid. Aortic valve regurgitation is not visualized. No aortic stenosis is present. Pulmonic Valve: The pulmonic valve was normal in structure. Pulmonic valve regurgitation is not visualized. No evidence  of pulmonic stenosis. Aorta: No hemodynamically significant coarctation of the aorta. The aortic root is normal in size and structure. Venous: The inferior vena cava is normal in size with greater than 50% respiratory variability, suggesting right atrial pressure of 3 mmHg. IAS/Shunts: No atrial level shunt detected by color flow Doppler. Agitated saline contrast was given intravenously to evaluate for intracardiac shunting. Agitated saline contrast bubble study was negative, with no evidence of any interatrial shunt.  LEFT VENTRICLE PLAX 2D LVOT diam:     1.90 cm  Diastology LV SV:         58       LV e' medial:    9.03 cm/s LV SV Index:   30       LV E/e' medial:  6.0 LVOT Area:     2.84 cm LV e' lateral:   16.60 cm/s                         LV E/e' lateral: 3.3                          2D Longitudinal Strain                         2D Strain GLS Avg:     -21.0 %                          3D Volume EF                         LV 3D EF:    59.00 %                         LV 3D EDV:   97400.00 mm                         LV 3D ESV:   40000.00 mm                         LV 3D SV:    57400.00 mm RIGHT VENTRICLE RV S prime:     12.60 cm/s TAPSE (M-mode): 1.8 cm LEFT ATRIUM             Index       RIGHT ATRIUM           Index LA Vol (A2C):   30.8 ml 15.67 ml/m RA Area:     11.30 cm LA Vol (A4C):   28.7 ml 14.60 ml/m RA Volume:   23.30 ml  11.86 ml/m LA Biplane Vol: 31.2 ml 15.88 ml/m  AORTIC VALVE LVOT Vmax:    129.00 cm/s LVOT Vmean:  84.600 cm/s LVOT VTI:    0.206 m  AORTA Ao Root diam: 3.00 cm Ao Asc diam:  2.70 cm MITRAL VALVE               TRICUSPID VALVE MV Area (PHT): 3.08 cm    TR Peak grad:   18.3 mmHg MV Decel Time: 246 msec    TR Vmax:        214.00 cm/s MV E velocity: 54.20 cm/s MV A velocity: 48.30 cm/s  SHUNTS MV E/A ratio:  1.12        Systemic VTI:  0.21 m  Systemic Diam: 1.90 cm Weston Brass MD Electronically signed by Weston Brass MD Signature Date/Time: 05/24/2020/4:52:49 PM    Final (Updated)    CT HEAD CODE STROKE WO CONTRAST  Result Date: 05/27/2020 CLINICAL DATA:  Code stroke.  Numbness of the right hand and foot. EXAM: CT HEAD WITHOUT CONTRAST TECHNIQUE: Contiguous axial images were obtained from the base of the skull through the vertex without intravenous contrast. COMPARISON:  None. FINDINGS: Brain: There is no mass, hemorrhage or extra-axial collection. The size and configuration of the ventricles and extra-axial CSF spaces are normal. There is a recent infarction within the right frontal lobe. No abnormality within the left hemisphere. Vascular: No abnormal hyperdensity of the major intracranial arteries or dural venous sinuses. No intracranial atherosclerosis. Skull: The visualized skull base, calvarium and extracranial soft tissues are normal. Sinuses/Orbits: No fluid levels or advanced mucosal thickening of the visualized paranasal sinuses. No mastoid or middle ear effusion. The orbits are normal. ASPECTS [facility] Stroke Program Early CT Score) -- left hemisphere - Ganglionic level infarction (caudate, lentiform nuclei, internal capsule, insula, M1-M3 cortex): 7 - Supraganglionic infarction (M4-M6 cortex): 3 Total score (0-10 with 10 being normal): 10 IMPRESSION: 1. Right frontal lobe recent infarction without hemorrhage or mass effect. 2. ASPECTS is 10. These results were called by telephone at the time of interpretation on 05/27/2020 at 12:47 am to  provider [facility] , who verbally acknowledged these results. Electronically Signed   By: Deatra Robinson M.D.   On: 05/27/2020 00:47   VAS [place] LOWER EXTREMITY VENOUS (DVT)  Result Date: 05/24/2020  Lower Venous DVT Study Patient Name:  Sarah Evans  Date of Exam:   05/24/2020 Medical Rec #: [id number]      Accession #:    [id number] Date of Birth: 01-26-1980      Patient Gender: F Patient Age:   49Y Exam Location[facility]  [facility] Procedure:      VAS [place] LOWER EXTREMITY VENOUS (DVT) Referring Phys: [id number] Marvel Plan --------------------------------------------------------------------------------  Indications: Stroke.  Comparison Study: no prior Performing Technologist: Blanch Media RVS  Examination Guidelines: A complete evaluation includes B-mode imaging, spectral Doppler, color Doppler, and power Doppler as needed of all accessible portions of each vessel. Bilateral testing is considered an integral part of a complete examination. Limited examinations for reoccurring indications may be performed as noted. The reflux portion of the exam is performed with the patient in reverse Trendelenburg.  +---------+---------------+---------+-----------+----------+-------------------+ RIGHT    CompressibilityPhasicitySpontaneityPropertiesThrombus Aging      +---------+---------------+---------+-----------+----------+-------------------+ CFV      Full           Yes      Yes                                      +---------+---------------+---------+-----------+----------+-------------------+ SFJ      Full                                                             +---------+---------------+---------+-----------+----------+-------------------+ FV Prox  Full                                                             +---------+---------------+---------+-----------+----------+-------------------+  FV Mid   Full                                                              +---------+---------------+---------+-----------+----------+-------------------+ FV DistalFull                                                             +---------+---------------+---------+-----------+----------+-------------------+ PFV      Full                                                             +---------+---------------+---------+-----------+----------+-------------------+ POP      Full           Yes      Yes                                      +---------+---------------+---------+-----------+----------+-------------------+ PTV      Full                                                             +---------+---------------+---------+-----------+----------+-------------------+ PERO                                                  Not well visualized +---------+---------------+---------+-----------+----------+-------------------+   +---------+---------------+---------+-----------+----------+--------------+ LEFT     CompressibilityPhasicitySpontaneityPropertiesThrombus Aging +---------+---------------+---------+-----------+----------+--------------+ CFV      Full           Yes      Yes                                 +---------+---------------+---------+-----------+----------+--------------+ SFJ      Full                                                        +---------+---------------+---------+-----------+----------+--------------+ FV Prox  Full                                                        +---------+---------------+---------+-----------+----------+--------------+ FV Mid   Full                                                        +---------+---------------+---------+-----------+----------+--------------+  FV DistalFull                                                        +---------+---------------+---------+-----------+----------+--------------+ PFV      Full                                                         +---------+---------------+---------+-----------+----------+--------------+ POP      Full           Yes      Yes                                 +---------+---------------+---------+-----------+----------+--------------+ PTV      Full                                                        +---------+---------------+---------+-----------+----------+--------------+ PERO     Full                                                        +---------+---------------+---------+-----------+----------+--------------+     Summary: BILATERAL: - No evidence of deep vein thrombosis seen in the lower extremities, bilaterally. -No evidence of popliteal cyst, bilaterally.   *See table(s) above for measurements and observations. Electronically signed by Lemar LivingsBrandon Cain MD on 05/24/2020 at 3:36:31 PM.    Final     Subjective: Without complaints  Discharge Exam: Vitals:   05/25/20 1027 05/25/20 1350  BP: 111/71 114/69  Pulse: 68 66  Resp: 16 16  Temp: 98 F (36.7 C)   SpO2: 97% 95%   Vitals:   05/25/20 0938 05/25/20 0944 05/25/20 1027 05/25/20 1350  BP: 120/63 (!) 109/58 111/71 114/69  Pulse: 63 (!) 56 68 66  Resp: 15 15 16 16   Temp:   98 F (36.7 C)   TempSrc:   Oral   SpO2: 100% 100% 97% 95%  Weight:      Height:        General: Pt is alert, awake, not in acute distress Cardiovascular: RRR, S1/S2 +, no rubs, no gallops Respiratory: CTA bilaterally, no wheezing, no rhonchi Abdominal: Soft, NT, ND, bowel sounds + Extremities: no edema, no cyanosis   The results of significant diagnostics from this hospitalization (including imaging, microbiology, ancillary and laboratory) are listed below for reference.     Microbiology: Recent Results (from the past 240 hour(s))  Resp Panel by RT-PCR (Flu A&B, Covid) Nasopharyngeal Swab     Status: None   Collection Time: 05/23/20 11:37 PM   Specimen: Nasopharyngeal Swab; Nasopharyngeal(NP) swabs in vial transport medium  Result Value  Ref Range Status   SARS Coronavirus 2 by RT PCR NEGATIVE NEGATIVE Final    Comment: (NOTE) SARS-CoV-2 target nucleic acids are NOT DETECTED.  The SARS-CoV-2 RNA is  generally detectable in upper respiratory specimens during the acute phase of infection. The lowest concentration of SARS-CoV-2 viral copies this assay can detect is 138 copies/mL. A negative result does not preclude SARS-Cov-2 infection and should not be used as the sole basis for treatment or other patient management decisions. A negative result may occur with  improper specimen collection/handling, submission of specimen other than nasopharyngeal swab, presence of viral mutation(s) within the areas targeted by this assay, and inadequate number of viral copies(<138 copies/mL). A negative result must be combined with clinical observations, patient history, and epidemiological information. The expected result is Negative.  Fact Sheet for Patients:  BloggerCourse.com  Fact Sheet for Healthcare Providers:  SeriousBroker.it  This test is no t yet approved or cleared by the [place] FDA and  has been authorized for detection and/or diagnosis of SARS-CoV-2 by FDA under an Emergency Use Authorization (EUA). This EUA will remain  in effect (meaning this test can be used) for the duration of the COVID-19 declaration under Section 564(b)(1) of the Act, 21 U.S.C.section 360bbb-3(b)(1), unless the authorization is terminated  or revoked sooner.       Influenza A by PCR NEGATIVE NEGATIVE Final   Influenza B by PCR NEGATIVE NEGATIVE Final    Comment: (NOTE) The Xpert Xpress SARS-CoV-2/FLU/RSV plus assay is intended as an aid in the diagnosis of influenza from Nasopharyngeal swab specimens and should not be used as a sole basis for treatment. Nasal washings and aspirates are unacceptable for Xpert Xpress SARS-CoV-2/FLU/RSV testing.  Fact Sheet for  Patients: BloggerCourse.com  Fact Sheet for Healthcare Providers: SeriousBroker.it  This test is not yet approved or cleared by the [place] FDA and has been authorized for detection and/or diagnosis of SARS-CoV-2 by FDA under an Emergency Use Authorization (EUA). This EUA will remain in effect (meaning this test can be used) for the duration of the COVID-19 declaration under Section 564(b)(1) of the Act, 21 U.S.C. section 360bbb-3(b)(1), unless the authorization is terminated or revoked.  Performed at [facility], [address], [place], [place] [address]   CSF culture     Status: None (Preliminary result)   Collection Time: 05/24/20  4:02 PM   Specimen: CSF; Cerebrospinal Fluid  Result Value Ref Range Status   Specimen Description CSF  Final   Special Requests NONE  Final   Gram Stain   Final    WBC PRESENT, PREDOMINANTLY MONONUCLEAR NO ORGANISMS SEEN CYTOSPIN SMEAR    Culture   Final    NO GROWTH 2 DAYS Performed at [facility], [address], [place], [place] [address]    Report Status PENDING  Incomplete  Resp Panel by RT-PCR (Flu A&B, Covid) Nasopharyngeal Swab     Status: None   Collection Time: 05/27/20  1:20 AM   Specimen: Nasopharyngeal Swab; Nasopharyngeal(NP) swabs in vial transport medium  Result Value Ref Range Status   SARS Coronavirus 2 by RT PCR NEGATIVE NEGATIVE Final    Comment: (NOTE) SARS-CoV-2 target nucleic acids are NOT DETECTED.  The SARS-CoV-2 RNA is generally detectable in upper respiratory specimens during the acute phase of infection. The lowest concentration of SARS-CoV-2 viral copies this assay can detect is 138 copies/mL. A negative result does not preclude SARS-Cov-2 infection and should not be used as the sole basis for treatment or other patient management decisions. A negative result may occur with  improper specimen collection/handling, submission of  specimen other than nasopharyngeal swab, presence of viral mutation(s) within the areas  targeted by this assay, and inadequate number of viral copies(<138 copies/mL). A negative result must be combined with clinical observations, patient history, and epidemiological information. The expected result is Negative.  Fact Sheet for Patients:  BloggerCourse.com  Fact Sheet for Healthcare Providers:  SeriousBroker.it  This test is no t yet approved or cleared by the [place] FDA and  has been authorized for detection and/or diagnosis of SARS-CoV-2 by FDA under an Emergency Use Authorization (EUA). This EUA will remain  in effect (meaning this test can be used) for the duration of the COVID-19 declaration under Section 564(b)(1) of the Act, 21 U.S.C.section 360bbb-3(b)(1), unless the authorization is terminated  or revoked sooner.       Influenza A by PCR NEGATIVE NEGATIVE Final   Influenza B by PCR NEGATIVE NEGATIVE Final    Comment: (NOTE) The Xpert Xpress SARS-CoV-2/FLU/RSV plus assay is intended as an aid in the diagnosis of influenza from Nasopharyngeal swab specimens and should not be used as a sole basis for treatment. Nasal washings and aspirates are unacceptable for Xpert Xpress SARS-CoV-2/FLU/RSV testing.  Fact Sheet for Patients: BloggerCourse.com  Fact Sheet for Healthcare Providers: SeriousBroker.it  This test is not yet approved or cleared by the [place] FDA and has been authorized for detection and/or diagnosis of SARS-CoV-2 by FDA under an Emergency Use Authorization (EUA). This EUA will remain in effect (meaning this test can be used) for the duration of the COVID-19 declaration under Section 564(b)(1) of the Act, 21 U.S.C. section 360bbb-3(b)(1), unless the authorization is terminated or revoked.  Performed at [facility], [address], [place], [place] [address]      Labs: BNP (last 3 results) No results for input(s): BNP in the last 8760 hours. Basic Metabolic Panel: Recent Labs  Lab 05/21/20 0931 05/23/20 0833 05/24/20 0332 05/27/20 0026 05/27/20 0050  NA 139 140 137 139 140  K 4.4 3.7 3.9 3.0* 3.4*  CL 105 108 109 108 108  CO2 18* --   GLUCOSE 86 93 94 107* 101*  BUN CREATININE 1.07* 0.86 0.99 1.05* 1.00  CALCIUM 9.1 9.2 8.5* 9.3  --    Liver Function Tests: Recent Labs  Lab 05/23/20 0833 05/27/20 0026  AST 21 22  ALT 35 33  ALKPHOS 65 75  BILITOT 0.6 0.5  PROT 7.1 7.9  ALBUMIN 3.6 4.0   No results for input(s): LIPASE, AMYLASE in the last 168 hours. No results for input(s): AMMONIA in the last 168 hours. CBC: Recent Labs  Lab 05/23/20 0833 05/24/20 0332 05/27/20 0026 05/27/20 0050  WBC 7.8 6.1 7.4  --   NEUTROABS 5.3  --  3.2  --   HGB 13.5 12.9 13.5 13.9  HCT 41.9 40.2 42.7 41.0  MCV 86.6 86.3 85.6  --   PLT 332 313 334  --    Cardiac Enzymes: No results for input(s): CKTOTAL, CKMB, CKMBINDEX, TROPONINI in the last 168 hours. BNP: Invalid input(s): POCBNP CBG: Recent Labs  Lab 05/24/20 2131 05/25/20 0751 05/25/20 1151 05/25/20 1614 05/27/20 0025  GLUCAP 121* 87 170* 102* 99   D-Dimer No results for input(s): DDIMER in the last 72 hours. Hgb A1c No results for input(s): HGBA1C in the last 72 hours. Lipid Profile No results for input(s): CHOL, HDL, LDLCALC, TRIG, CHOLHDL, LDLDIRECT in the last 72 hours. Thyroid function studies No results for input(s): TSH, T4TOTAL, T3FREE, THYROIDAB in the last 72 hours.  Invalid input(s): FREET3 Anemia work up No results for input(s): VITAMINB12, FOLATE, FERRITIN, TIBC, IRON, RETICCTPCT in the last 72 hours. Urinalysis    Component Value Date/Time   COLORURINE RED (A) 05/27/2020 0102   APPEARANCEUR CLOUDY (A) 05/27/2020 0102   LABSPEC 1.039 (H) 05/27/2020 0102   PHURINE 5.0 05/27/2020 0102    GLUCOSEU NEGATIVE 05/27/2020 0102   HGBUR LARGE (A) 05/27/2020 0102   BILIRUBINUR NEGATIVE 05/27/2020 0102   KETONESUR 5 (A) 05/27/2020 0102   PROTEINUR 100 (A) 05/27/2020 0102   NITRITE NEGATIVE 05/27/2020 0102   LEUKOCYTESUR NEGATIVE 05/27/2020 0102   Sepsis Labs Invalid input(s): PROCALCITONIN,  WBC,  LACTICIDVEN Microbiology Recent Results (from the past 240 hour(s))  Resp Panel by RT-PCR (Flu A&B, Covid) Nasopharyngeal Swab     Status: None   Collection Time: 05/23/20 11:37 PM   Specimen: Nasopharyngeal Swab; Nasopharyngeal(NP) swabs in vial transport medium  Result Value Ref Range Status   SARS Coronavirus 2 by RT PCR NEGATIVE NEGATIVE Final    Comment: (NOTE) SARS-CoV-2 target nucleic acids are NOT DETECTED.  The SARS-CoV-2 RNA is generally detectable in upper respiratory specimens during the acute phase of infection. The lowest concentration of SARS-CoV-2 viral copies this assay can detect is 138 copies/mL. A negative result does not preclude SARS-Cov-2 infection and should not be used as the sole basis for treatment or other patient management decisions. A negative result may occur with  improper specimen collection/handling, submission of specimen other than nasopharyngeal swab, presence of viral mutation(s) within the areas targeted by this assay, and inadequate number of viral copies(<138 copies/mL). A negative result must be combined with clinical observations, patient history, and epidemiological information. The expected result is Negative.  Fact Sheet for Patients:  BloggerCourse.com  Fact Sheet for Healthcare Providers:  SeriousBroker.it  This test is no t yet approved or cleared by the [place] FDA and  has been authorized for detection and/or diagnosis of SARS-CoV-2 by FDA under an Emergency Use Authorization (EUA). This EUA will remain  in effect (meaning this test can be used) for the duration of  the COVID-19 declaration under Section 564(b)(1) of the Act, 21 U.S.C.section 360bbb-3(b)(1), unless the authorization is terminated  or revoked sooner.       Influenza A by PCR NEGATIVE NEGATIVE Final   Influenza B by PCR NEGATIVE NEGATIVE Final    Comment: (NOTE) The Xpert Xpress SARS-CoV-2/FLU/RSV plus assay is intended as an aid in the diagnosis of influenza from Nasopharyngeal swab specimens and should not be used as a sole basis for treatment. Nasal washings and aspirates are unacceptable for Xpert Xpress SARS-CoV-2/FLU/RSV testing.  Fact Sheet for Patients: BloggerCourse.com  Fact Sheet for Healthcare Providers: SeriousBroker.it  This test is not yet approved or cleared by the [place] FDA and has been authorized for detection and/or diagnosis of SARS-CoV-2 by FDA under an Emergency Use Authorization (EUA). This EUA will remain in effect (meaning this test can be used) for the duration of the COVID-19 declaration under Section 564(b)(1) of the Act, 21 U.S.C. section 360bbb-3(b)(1), unless the authorization is terminated or revoked.  Performed at [facility], [address], [place], [place] [address]   CSF culture     Status: None (Preliminary result)   Collection Time: 05/24/20  4:02 PM   Specimen: CSF; Cerebrospinal Fluid  Result Value Ref Range Status   Specimen Description CSF  Final   Special Requests NONE  Final   Gram Stain   Final  WBC PRESENT, PREDOMINANTLY MONONUCLEAR NO ORGANISMS SEEN CYTOSPIN SMEAR    Culture   Final    NO GROWTH 2 DAYS Performed at [facility], [address], [place], [place] [address]    Report Status PENDING  Incomplete  Resp Panel by RT-PCR (Flu A&B, Covid) Nasopharyngeal Swab     Status: None   Collection Time: 05/27/20  1:20 AM   Specimen: Nasopharyngeal Swab; Nasopharyngeal(NP) swabs in vial transport medium  Result Value Ref Range Status    SARS Coronavirus 2 by RT PCR NEGATIVE NEGATIVE Final    Comment: (NOTE) SARS-CoV-2 target nucleic acids are NOT DETECTED.  The SARS-CoV-2 RNA is generally detectable in upper respiratory specimens during the acute phase of infection. The lowest concentration of SARS-CoV-2 viral copies this assay can detect is 138 copies/mL. A negative result does not preclude SARS-Cov-2 infection and should not be used as the sole basis for treatment or other patient management decisions. A negative result may occur with  improper specimen collection/handling, submission of specimen other than nasopharyngeal swab, presence of viral mutation(s) within the areas targeted by this assay, and inadequate number of viral copies(<138 copies/mL). A negative result must be combined with clinical observations, patient history, and epidemiological information. The expected result is Negative.  Fact Sheet for Patients:  BloggerCourse.com  Fact Sheet for Healthcare Providers:  SeriousBroker.it  This test is no t yet approved or cleared by the [place] FDA and  has been authorized for detection and/or diagnosis of SARS-CoV-2 by FDA under an Emergency Use Authorization (EUA). This EUA will remain  in effect (meaning this test can be used) for the duration of the COVID-19 declaration under Section 564(b)(1) of the Act, 21 U.S.C.section 360bbb-3(b)(1), unless the authorization is terminated  or revoked sooner.       Influenza A by PCR NEGATIVE NEGATIVE Final   Influenza B by PCR NEGATIVE NEGATIVE Final    Comment: (NOTE) The Xpert Xpress SARS-CoV-2/FLU/RSV plus assay is intended as an aid in the diagnosis of influenza from Nasopharyngeal swab specimens and should not be used as a sole basis for treatment. Nasal washings and aspirates are unacceptable for Xpert Xpress SARS-CoV-2/FLU/RSV testing.  Fact Sheet for  Patients: BloggerCourse.com  Fact Sheet for Healthcare Providers: SeriousBroker.it  This test is not yet approved or cleared by the [place] FDA and has been authorized for detection and/or diagnosis of SARS-CoV-2 by FDA under an Emergency Use Authorization (EUA). This EUA will remain in effect (meaning this test can be used) for the duration of the COVID-19 declaration under Section 564(b)(1) of the Act, 21 U.S.C. section 360bbb-3(b)(1), unless the authorization is terminated or revoked.  Performed at [facility], [address] [place], [place] [address]    Time spent: 30 min  SIGNED:   Rickey Barbara, MD  [facility] 05/27/2020, 8:25 AM  If 7PM-7AM, please contact night-coverage

## 2020-05-25 NOTE — Plan of Care (Signed)
  Problem: Education: Goal: Knowledge of disease or condition will improve 05/25/2020 1601 by Ross Ludwig, LPN Outcome: Adequate for Discharge 05/25/2020 1600 by Ross Ludwig, LPN Outcome: Progressing Goal: Knowledge of secondary prevention will improve 05/25/2020 1601 by Ross Ludwig, LPN Outcome: Adequate for Discharge 05/25/2020 1600 by Ross Ludwig, LPN Outcome: Progressing Goal: Knowledge of patient specific risk factors addressed and post discharge goals established will improve 05/25/2020 1601 by Ross Ludwig, LPN Outcome: Adequate for Discharge 05/25/2020 1600 by Ross Ludwig, LPN Outcome: Progressing   Problem: Education: Goal: Knowledge of General Education information will improve Description: Including pain rating scale, medication(s)/side effects and non-pharmacologic comfort measures 05/25/2020 1601 by Ross Ludwig, LPN Outcome: Adequate for Discharge 05/25/2020 1600 by Ross Ludwig, LPN Outcome: Progressing   Problem: Health Behavior/Discharge Planning: Goal: Ability to manage health-related needs will improve 05/25/2020 1601 by Ross Ludwig, LPN Outcome: Adequate for Discharge 05/25/2020 1600 by Ross Ludwig, LPN Outcome: Progressing   Problem: Clinical Measurements: Goal: Ability to maintain clinical measurements within normal limits will improve 05/25/2020 1601 by Ross Ludwig, LPN Outcome: Adequate for Discharge 05/25/2020 1600 by Ross Ludwig, LPN Outcome: Progressing Goal: Will remain free from infection 05/25/2020 1601 by Ross Ludwig, LPN Outcome: Adequate for Discharge 05/25/2020 1600 by Ross Ludwig, LPN Outcome: Progressing Goal: Diagnostic test results will improve 05/25/2020 1601 by Ross Ludwig, LPN Outcome: Adequate for Discharge 05/25/2020 1600 by Ross Ludwig, LPN Outcome: Progressing Goal: Respiratory complications will improve 05/25/2020 1601 by Ross Ludwig, LPN Outcome: Adequate  for Discharge 05/25/2020 1600 by Ross Ludwig, LPN Outcome: Progressing Goal: Cardiovascular complication will be avoided 05/25/2020 1601 by Ross Ludwig, LPN Outcome: Adequate for Discharge 05/25/2020 1600 by Ross Ludwig, LPN Outcome: Progressing   Problem: Activity: Goal: Risk for activity intolerance will decrease 05/25/2020 1601 by Ross Ludwig, LPN Outcome: Adequate for Discharge 05/25/2020 1600 by Ross Ludwig, LPN Outcome: Progressing   Problem: Nutrition: Goal: Adequate nutrition will be maintained 05/25/2020 1601 by Ross Ludwig, LPN Outcome: Adequate for Discharge 05/25/2020 1600 by Ross Ludwig, LPN Outcome: Progressing   Problem: Coping: Goal: Level of anxiety will decrease 05/25/2020 1601 by Ross Ludwig, LPN Outcome: Adequate for Discharge 05/25/2020 1600 by Ross Ludwig, LPN Outcome: Progressing   Problem: Elimination: Goal: Will not experience complications related to bowel motility 05/25/2020 1601 by Ross Ludwig, LPN Outcome: Adequate for Discharge 05/25/2020 1600 by Ross Ludwig, LPN Outcome: Progressing Goal: Will not experience complications related to urinary retention 05/25/2020 1601 by Ross Ludwig, LPN Outcome: Adequate for Discharge 05/25/2020 1600 by Ross Ludwig, LPN Outcome: Progressing   Problem: Pain Managment: Goal: General experience of comfort will improve 05/25/2020 1601 by Ross Ludwig, LPN Outcome: Adequate for Discharge 05/25/2020 1600 by Ross Ludwig, LPN Outcome: Progressing   Problem: Safety: Goal: Ability to remain free from injury will improve 05/25/2020 1601 by Ross Ludwig, LPN Outcome: Adequate for Discharge 05/25/2020 1600 by Ross Ludwig, LPN Outcome: Progressing   Problem: Skin Integrity: Goal: Risk for impaired skin integrity will decrease 05/25/2020 1601 by Ross Ludwig, LPN Outcome: Adequate for Discharge 05/25/2020 1600 by Ross Ludwig,  LPN Outcome: Progressing

## 2020-05-25 NOTE — Anesthesia Preprocedure Evaluation (Signed)
Anesthesia Evaluation  Patient identified by MRN, date of birth, ID band Patient awake    Reviewed: Allergy & Precautions, NPO status , Patient's Chart, lab work & pertinent test results  Airway Mallampati: II  TM Distance: >3 FB Neck ROM: Full    Dental  (+) Dental Advisory Given   Pulmonary former smoker,    breath sounds clear to auscultation       Cardiovascular negative cardio ROS   Rhythm:Regular Rate:Normal     Neuro/Psych  Headaches, CVA    GI/Hepatic negative GI ROS, Neg liver ROS,   Endo/Other  diabetes  Renal/GU negative Renal ROS     Musculoskeletal   Abdominal   Peds  Hematology negative hematology ROS (+)   Anesthesia Other Findings   Reproductive/Obstetrics                             Anesthesia Physical Anesthesia Plan  ASA: III  Anesthesia Plan: MAC   Post-op Pain Management:    Induction:   PONV Risk Score and Plan: 2 and Propofol infusion, Ondansetron and Treatment may vary due to age or medical condition  Airway Management Planned: Natural Airway, Simple Face Mask and Nasal Cannula  Additional Equipment:   Intra-op Plan:   Post-operative Plan:   Informed Consent: I have reviewed the patients History and Physical, chart, labs and discussed the procedure including the risks, benefits and alternatives for the proposed anesthesia with the patient or authorized representative who has indicated his/her understanding and acceptance.       Plan Discussed with: CRNA  Anesthesia Plan Comments:         Anesthesia Quick Evaluation

## 2020-05-25 NOTE — Anesthesia Postprocedure Evaluation (Signed)
Anesthesia Post Note  Patient: Sarah Evans  Procedure(s) Performed: TRANSESOPHAGEAL ECHOCARDIOGRAM (TEE) (N/A ) BUBBLE STUDY     Patient location during evaluation: PACU Anesthesia Type: MAC Level of consciousness: awake and alert Pain management: pain level controlled Vital Signs Assessment: post-procedure vital signs reviewed and stable Respiratory status: spontaneous breathing, nonlabored ventilation, respiratory function stable and patient connected to nasal cannula oxygen Cardiovascular status: stable and blood pressure returned to baseline Postop Assessment: no apparent nausea or vomiting Anesthetic complications: no   No complications documented.  Last Vitals:  Vitals:   05/25/20 0938 05/25/20 0944  BP: 120/63 (!) 109/58  Pulse: 63 (!) 56  Resp: 15 15  Temp:    SpO2: 100% 100%    Last Pain:  Vitals:   05/25/20 0944  TempSrc:   PainSc: 0-No pain                 Tiajuana Amass

## 2020-05-25 NOTE — Plan of Care (Signed)
  Problem: Education: Goal: Knowledge of disease or condition will improve Outcome: Progressing Goal: Knowledge of secondary prevention will improve Outcome: Progressing Goal: Knowledge of patient specific risk factors addressed and post discharge goals established will improve Outcome: Progressing   Problem: Education: Goal: Knowledge of General Education information will improve Description: Including pain rating scale, medication(s)/side effects and non-pharmacologic comfort measures Outcome: Progressing   Problem: Health Behavior/Discharge Planning: Goal: Ability to manage health-related needs will improve Outcome: Progressing   Problem: Clinical Measurements: Goal: Ability to maintain clinical measurements within normal limits will improve Outcome: Progressing Goal: Will remain free from infection Outcome: Progressing Goal: Diagnostic test results will improve Outcome: Progressing Goal: Respiratory complications will improve Outcome: Progressing Goal: Cardiovascular complication will be avoided Outcome: Progressing   Problem: Activity: Goal: Risk for activity intolerance will decrease Outcome: Progressing   Problem: Nutrition: Goal: Adequate nutrition will be maintained Outcome: Progressing   Problem: Elimination: Goal: Will not experience complications related to bowel motility Outcome: Progressing Goal: Will not experience complications related to urinary retention Outcome: Progressing   Problem: Coping: Goal: Level of anxiety will decrease Outcome: Progressing   Problem: Pain Managment: Goal: General experience of comfort will improve Outcome: Progressing   Problem: Safety: Goal: Ability to remain free from injury will improve Outcome: Progressing   Problem: Skin Integrity: Goal: Risk for impaired skin integrity will decrease Outcome: Progressing

## 2020-05-26 ENCOUNTER — Observation Stay (HOSPITAL_COMMUNITY)
Admission: EM | Admit: 2020-05-26 | Discharge: 2020-05-28 | Disposition: A | Discharge status: Home / Self Care | Payer: Self-pay | Attending: Internal Medicine | Admitting: Internal Medicine

## 2020-05-26 ENCOUNTER — Other Ambulatory Visit: Payer: Self-pay

## 2020-05-26 DIAGNOSIS — Z79899 Other long term (current) drug therapy: Secondary | ICD-10-CM | POA: Insufficient documentation

## 2020-05-26 DIAGNOSIS — Z7982 Long term (current) use of aspirin: Secondary | ICD-10-CM | POA: Insufficient documentation

## 2020-05-26 DIAGNOSIS — Z7984 Long term (current) use of oral hypoglycemic drugs: Secondary | ICD-10-CM | POA: Insufficient documentation

## 2020-05-26 DIAGNOSIS — Y900 Blood alcohol level of less than 20 mg/100 ml: Secondary | ICD-10-CM | POA: Insufficient documentation

## 2020-05-26 DIAGNOSIS — Z87891 Personal history of nicotine dependence: Secondary | ICD-10-CM | POA: Insufficient documentation

## 2020-05-26 DIAGNOSIS — Z20822 Contact with and (suspected) exposure to covid-19: Secondary | ICD-10-CM | POA: Insufficient documentation

## 2020-05-26 DIAGNOSIS — R202 Paresthesia of skin: Principal | ICD-10-CM | POA: Insufficient documentation

## 2020-05-26 DIAGNOSIS — R7303 Prediabetes: Secondary | ICD-10-CM | POA: Diagnosis present

## 2020-05-26 DIAGNOSIS — F259 Schizoaffective disorder, unspecified: Secondary | ICD-10-CM | POA: Insufficient documentation

## 2020-05-26 DIAGNOSIS — F250 Schizoaffective disorder, bipolar type: Secondary | ICD-10-CM | POA: Diagnosis present

## 2020-05-26 DIAGNOSIS — R2990 Unspecified symptoms and signs involving the nervous system: Secondary | ICD-10-CM

## 2020-05-26 DIAGNOSIS — E119 Type 2 diabetes mellitus without complications: Secondary | ICD-10-CM | POA: Insufficient documentation

## 2020-05-26 DIAGNOSIS — I639 Cerebral infarction, unspecified: Secondary | ICD-10-CM

## 2020-05-26 DIAGNOSIS — E782 Mixed hyperlipidemia: Secondary | ICD-10-CM | POA: Diagnosis present

## 2020-05-26 DIAGNOSIS — K219 Gastro-esophageal reflux disease without esophagitis: Secondary | ICD-10-CM | POA: Diagnosis present

## 2020-05-26 DIAGNOSIS — R200 Anesthesia of skin: Secondary | ICD-10-CM | POA: Diagnosis present

## 2020-05-26 DIAGNOSIS — Z8673 Personal history of transient ischemic attack (TIA), and cerebral infarction without residual deficits: Secondary | ICD-10-CM

## 2020-05-26 LAB — CARDIOLIPIN ANTIBODIES, IGG, IGM, IGA
Anticardiolipin IgA: <9 APL U/mL (ref 0–11)
Anticardiolipin IgG: <9 GPL U/mL (ref 0–14)
Anticardiolipin IgM: <9 MPL U/mL (ref 0–12)

## 2020-05-27 ENCOUNTER — Emergency Department (HOSPITAL_COMMUNITY): Payer: Self-pay

## 2020-05-27 ENCOUNTER — Observation Stay (HOSPITAL_COMMUNITY): Payer: Self-pay

## 2020-05-27 ENCOUNTER — Encounter (HOSPITAL_COMMUNITY): Payer: Self-pay | Admitting: Internal Medicine

## 2020-05-27 DIAGNOSIS — Z8673 Personal history of transient ischemic attack (TIA), and cerebral infarction without residual deficits: Secondary | ICD-10-CM

## 2020-05-27 DIAGNOSIS — R202 Paresthesia of skin: Secondary | ICD-10-CM

## 2020-05-27 DIAGNOSIS — E782 Mixed hyperlipidemia: Secondary | ICD-10-CM

## 2020-05-27 DIAGNOSIS — R200 Anesthesia of skin: Secondary | ICD-10-CM

## 2020-05-27 DIAGNOSIS — K219 Gastro-esophageal reflux disease without esophagitis: Secondary | ICD-10-CM | POA: Diagnosis present

## 2020-05-27 LAB — RESP PANEL BY RT-PCR (FLU A&B, COVID) ARPGX2
Influenza A by PCR: NEGATIVE
Influenza B by PCR: NEGATIVE
SARS Coronavirus 2 by RT PCR: NEGATIVE

## 2020-05-27 LAB — COMPREHENSIVE METABOLIC PANEL
ALT: 33 U/L (ref 0–44)
AST: 22 U/L (ref 15–41)
Albumin: 4 g/dL (ref 3.5–5.0)
Alkaline Phosphatase: 75 U/L (ref 38–126)
Anion gap: 9 (ref 5–15)
BUN: 13 mg/dL (ref 6–20)
CO2: 22 mmol/L (ref 22–32)
Calcium: 9.3 mg/dL (ref 8.9–10.3)
Chloride: 108 mmol/L (ref 98–111)
Creatinine, Ser: 1.05 mg/dL — ABNORMAL HIGH (ref 0.44–1.00)
GFR, Estimated: >60 mL/min (ref >60)
Glucose, Bld: 107 mg/dL — ABNORMAL HIGH (ref 70–99)
Potassium: 3 mmol/L — ABNORMAL LOW (ref 3.5–5.1)
Sodium: 139 mmol/L (ref 135–145)
Total Bilirubin: 0.5 mg/dL (ref 0.3–1.2)
Total Protein: 7.9 g/dL (ref 6.5–8.1)

## 2020-05-27 LAB — I-STAT CHEM 8, ED
BUN: 15 mg/dL (ref 6–20)
Calcium, Ion: 1.11 mmol/L — ABNORMAL LOW (ref 1.15–1.40)
Chloride: 108 mmol/L (ref 98–111)
Creatinine, Ser: 1 mg/dL (ref 0.44–1.00)
Glucose, Bld: 101 mg/dL — ABNORMAL HIGH (ref 70–99)
HCT: 41 % (ref 36.0–46.0)
Hemoglobin: 13.9 g/dL (ref 12.0–15.0)
Potassium: 3.4 mmol/L — ABNORMAL LOW (ref 3.5–5.1)
Sodium: 140 mmol/L (ref 135–145)
TCO2: 22 mmol/L (ref 22–32)

## 2020-05-27 LAB — DIFFERENTIAL
Abs Immature Granulocytes: 0.02 10*3/uL (ref 0.00–0.07)
Basophils Absolute: 0.1 10*3/uL (ref 0.0–0.1)
Basophils Relative: 1 %
Eosinophils Absolute: 0.1 10*3/uL (ref 0.0–0.5)
Eosinophils Relative: 2 %
Immature Granulocytes: 0 %
Lymphocytes Relative: 44 %
Lymphs Abs: 3.3 10*3/uL (ref 0.7–4.0)
Monocytes Absolute: 0.7 10*3/uL (ref 0.1–1.0)
Monocytes Relative: 9 %
Neutro Abs: 3.2 10*3/uL (ref 1.7–7.7)
Neutrophils Relative %: 44 %

## 2020-05-27 LAB — URINALYSIS, ROUTINE W REFLEX MICROSCOPIC
Bacteria, UA: NONE SEEN
Bilirubin Urine: NEGATIVE
Glucose, UA: NEGATIVE mg/dL
Ketones, ur: 5 mg/dL — AB
Leukocytes,Ua: NEGATIVE
Nitrite: NEGATIVE
Protein, ur: 100 mg/dL — AB
RBC / HPF: >50 RBC/hpf — ABNORMAL HIGH (ref 0–5)
Specific Gravity, Urine: 1.039 — ABNORMAL HIGH (ref 1.005–1.030)
pH: 5 (ref 5.0–8.0)

## 2020-05-27 LAB — RAPID URINE DRUG SCREEN, HOSP PERFORMED
Amphetamines: NOT DETECTED
Barbiturates: NOT DETECTED
Benzodiazepines: NOT DETECTED
Cocaine: NOT DETECTED
Opiates: NOT DETECTED
Tetrahydrocannabinol: NOT DETECTED

## 2020-05-27 LAB — CBC
HCT: 42.7 % (ref 36.0–46.0)
Hemoglobin: 13.5 g/dL (ref 12.0–15.0)
MCH: 27.1 pg (ref 26.0–34.0)
MCHC: 31.6 g/dL (ref 30.0–36.0)
MCV: 85.6 fL (ref 80.0–100.0)
Platelets: 334 10*3/uL (ref 150–400)
RBC: 4.99 MIL/uL (ref 3.87–5.11)
RDW: 14.3 % (ref 11.5–15.5)
WBC: 7.4 10*3/uL (ref 4.0–10.5)
nRBC: 0 % (ref 0.0–0.2)

## 2020-05-27 LAB — APTT: aPTT: 24 seconds (ref 24–36)

## 2020-05-27 LAB — PROTIME-INR
INR: 0.9 (ref 0.8–1.2)
Prothrombin Time: 12.5 seconds (ref 11.4–15.2)

## 2020-05-27 LAB — HSV 1/2 PCR, CSF
HSV-1 DNA: NEGATIVE
HSV-2 DNA: NEGATIVE

## 2020-05-27 LAB — CBG MONITORING, ED: Glucose-Capillary: 99 mg/dL (ref 70–99)

## 2020-05-27 LAB — I-STAT BETA HCG BLOOD, ED (MC, WL, AP ONLY): I-stat hCG, quantitative: <5 m[IU]/mL (ref <5)

## 2020-05-27 LAB — ETHANOL: Alcohol, Ethyl (B): <10 mg/dL (ref <10)

## 2020-05-27 MED ORDER — ASPIRIN EC 81 MG PO TBEC
81.0000 mg | DELAYED_RELEASE_TABLET | Freq: Every day | ORAL | Status: DC
  Administered 2020-05-28: 81 mg via ORAL
  Filled 2020-05-27: qty 1

## 2020-05-27 MED ORDER — ASPIRIN-DIPYRIDAMOLE ER 25-200 MG PO CP12
1.0000 | ORAL_CAPSULE | Freq: Two times a day (BID) | ORAL | Status: DC
  Administered 2020-05-27: 1 via ORAL
  Filled 2020-05-27: qty 1

## 2020-05-27 MED ORDER — ASPIRIN EC 81 MG PO TBEC
81.0000 mg | DELAYED_RELEASE_TABLET | Freq: Every day | ORAL | Status: DC
  Filled 2020-05-27: qty 1

## 2020-05-27 MED ORDER — ATORVASTATIN CALCIUM 10 MG PO TABS
10.0000 mg | ORAL_TABLET | Freq: Every day | ORAL | Status: DC
  Administered 2020-05-27 – 2020-05-28 (×2): 10 mg via ORAL
  Filled 2020-05-27 (×2): qty 1

## 2020-05-27 MED ORDER — PANTOPRAZOLE SODIUM 20 MG PO TBEC
20.0000 mg | DELAYED_RELEASE_TABLET | Freq: Every day | ORAL | Status: DC
  Administered 2020-05-27 – 2020-05-28 (×2): 20 mg via ORAL
  Filled 2020-05-27 (×2): qty 1

## 2020-05-27 MED ORDER — ONDANSETRON HCL 4 MG/2ML IJ SOLN: 4.0000 mg | Freq: Four times a day (QID) | INTRAMUSCULAR | Status: DC | PRN

## 2020-05-27 MED ORDER — BUSPIRONE HCL 5 MG PO TABS
15.0000 mg | ORAL_TABLET | Freq: Three times a day (TID) | ORAL | Status: DC
  Administered 2020-05-27 – 2020-05-28 (×4): 15 mg via ORAL
  Filled 2020-05-27 (×4): qty 3
  Filled 2020-05-27: qty 2

## 2020-05-27 MED ORDER — SODIUM CHLORIDE (PF) 0.9 % IJ SOLN
INTRAMUSCULAR | Status: AC
  Filled 2020-05-27: qty 50

## 2020-05-27 MED ORDER — POLYETHYLENE GLYCOL 3350 17 G PO PACK: 17.0000 g | PACK | Freq: Every day | ORAL | Status: DC | PRN

## 2020-05-27 MED ORDER — QUETIAPINE FUMARATE 25 MG PO TABS
25.0000 mg | ORAL_TABLET | Freq: Two times a day (BID) | ORAL | Status: DC
  Administered 2020-05-27 – 2020-05-28 (×3): 25 mg via ORAL
  Filled 2020-05-27 (×3): qty 1

## 2020-05-27 MED ORDER — VALACYCLOVIR HCL 500 MG PO TABS
1000.0000 mg | ORAL_TABLET | Freq: Every day | ORAL | Status: DC
  Administered 2020-05-27 – 2020-05-28 (×2): 1000 mg via ORAL
  Filled 2020-05-27 (×2): qty 2

## 2020-05-27 MED ORDER — PRAZOSIN HCL 1 MG PO CAPS
1.0000 mg | ORAL_CAPSULE | Freq: Every evening | ORAL | Status: DC | PRN
  Administered 2020-05-27: 1 mg via ORAL
  Filled 2020-05-27 (×2): qty 1

## 2020-05-27 MED ORDER — LORAZEPAM 2 MG/ML IJ SOLN
1.0000 mg | INTRAMUSCULAR | Status: AC
  Administered 2020-05-27: 1 mg via INTRAVENOUS
  Filled 2020-05-27: qty 1

## 2020-05-27 MED ORDER — ACETAMINOPHEN 650 MG RE SUPP: 650.0000 mg | RECTAL | Status: DC | PRN

## 2020-05-27 MED ORDER — ACETAMINOPHEN 160 MG/5ML PO SOLN: 650.0000 mg | ORAL | Status: DC | PRN

## 2020-05-27 MED ORDER — CLOPIDOGREL BISULFATE 75 MG PO TABS
75.0000 mg | ORAL_TABLET | Freq: Every day | ORAL | Status: DC
  Administered 2020-05-28: 75 mg via ORAL
  Filled 2020-05-27: qty 1

## 2020-05-27 MED ORDER — CLOPIDOGREL BISULFATE 75 MG PO TABS
75.0000 mg | ORAL_TABLET | Freq: Every day | ORAL | Status: DC
  Filled 2020-05-27: qty 1

## 2020-05-27 MED ORDER — ASPIRIN-ACETAMINOPHEN-CAFFEINE 250-250-65 MG PO TABS
1.0000 | ORAL_TABLET | Freq: Four times a day (QID) | ORAL | Status: DC | PRN
  Administered 2020-05-27 – 2020-05-28 (×2): 1 via ORAL
  Filled 2020-05-27 (×4): qty 1

## 2020-05-27 MED ORDER — ACETAMINOPHEN 325 MG PO TABS: 650.0000 mg | ORAL_TABLET | ORAL | Status: DC | PRN

## 2020-05-27 MED ORDER — CYCLOBENZAPRINE HCL 10 MG PO TABS: 10.0000 mg | ORAL_TABLET | Freq: Every day | ORAL | Status: DC | PRN

## 2020-05-27 MED ORDER — QUETIAPINE FUMARATE 100 MG PO TABS
100.0000 mg | ORAL_TABLET | Freq: Every day | ORAL | Status: DC
  Administered 2020-05-27: 100 mg via ORAL
  Filled 2020-05-27: qty 1

## 2020-05-27 MED ORDER — STROKE: EARLY STAGES OF RECOVERY BOOK
Freq: Once | Status: AC
  Filled 2020-05-27: qty 1

## 2020-05-27 NOTE — Progress Notes (Signed)
Patient ID: Sarah Evans, female   DOB: 04/04/1980, 40 y.o.   MRN: [id number] Patient admitted early this morning for new onset numbness and teleneurology recommends work-up for stroke with MRI brain and transferred to [facility].  Patient seen at bedside and benefit discussed with her.  Follow MRI brain.  Transfer to [facility] for neurology evaluation once bed is available.

## 2020-05-27 NOTE — Evaluation (Signed)
Physical Therapy Evaluation Patient Details Name: Sarah Evans MRN: [id number] DOB: 12-Sep-1980 Today's Date: 05/27/2020   History of Present Illness  40 year old female with past medical history of gastrosophageal reflux disease, hyperlipidemia, prediabetes, schizoaffective disorder and recent ischemic stroke of the right frontal and occipital region who presents emergency department with new onset numbness. Of note, patient was admitted to [facility] from 5/18 until 5/20.  During that hospitalization, patient was diagnosed with a stroke of the right frontal and occipital region.  At time of discharge, the majority of the patient's neurologic deficits had completely returned to baseline except for left hand weakness and numbness/tingling in fingers.  Patient was discharged on 5/20. Now presents with R sided numbness. MRI with no new abnormalities just expected evoluation of infarcts.  Clinical Impression  Patient returned to ER for new onset of right sided numbness. Had prior deficits from CVA in left hand and had discharged home with HEP and theraputty. Patient reports only mild residual numbness on R toes 2 and 3, mild decreased sensation to R side of face and mild resolving blurry vision. Pt is able to demonstrate IND with bed mobility, transfers and gait stating she feels she is ambulating at her base line.  No complaints of other visual disturbances or cognitive impairment. PT service to sign off at this time.   Follow Up Recommendations No PT follow up    Equipment Recommendations  None recommended by PT    Recommendations for Other Services       Precautions / Restrictions Precautions Precautions: None Restrictions Weight Bearing Restrictions: No      Mobility  Bed Mobility Overal bed mobility: Independent                  Transfers Overall transfer level: Independent                  Ambulation/Gait Ambulation/Gait assistance: Min  guard;Supervision;Independent Gait Distance (Feet): 300 Feet Assistive device: None Gait Pattern/deviations: Step-through pattern;Decreased step length - right;Decreased step length - left;Shuffle;Wide base of support Gait velocity: mod pace   General Gait Details: Slightly widened BOS and mildly plodding gait but pt states she feels this is her baseline  Careers information officer    Modified Rankin (Stroke Patients Only)       Balance Overall balance assessment: Independent Sitting-balance support: No upper extremity supported;Feet supported Sitting balance-Leahy Scale: Normal     Standing balance support: No upper extremity supported     Single Leg Stance - Right Leg: 10 Single Leg Stance - Left Leg: 10         High level balance activites: Side stepping;Backward walking;Direction changes High Level Balance Comments: No loss of balance with side step, stork stand, tandem step, or back stepping             Pertinent Vitals/Pain Pain Assessment: No/denies pain    Home Living Family/patient expects to be discharged to:: Private residence Living Arrangements: Alone Available Help at Discharge: Family;Available PRN/intermittently Type of Home: House Home Access: Stairs to enter   [employer] of Steps: 2-3 Home Layout: Two level Home Equipment: Grab bars - tub/shower      Prior Function Level of Independence: Independent         Comments: I with ADLs/IADLs; driving; working as a Conservation officer, nature at [employer]   Dominant Hand: Right    Extremity/Trunk Assessment  Upper Extremity Assessment Upper Extremity Assessment: Defer to OT evaluation RUE Deficits / Details: WNL ROM, 5/5 strength RUE Sensation: WNL RUE Coordination: WNL LUE Deficits / Details: WFL ROM, 5/5 shoulder and elbow strength, 4/5 wrist strength, decreased grasp strength compared to right, reports continues numbness in fingertips, finger  movement bradykinetic LUE Sensation: decreased light touch LUE Coordination: decreased fine motor    Lower Extremity Assessment Lower Extremity Assessment: LLE deficits/detail;RLE deficits/detail RLE Deficits / Details: WNL ROM; report of mild residual sensory changes on toes 2 and 3 only LLE Deficits / Details: ROM WFL; strength 4+/5; mild fine motor coordination deficit vs R LE LLE Coordination: decreased fine motor    Cervical / Trunk Assessment Cervical / Trunk Assessment: Normal  Communication   Communication: No difficulties  Cognition Arousal/Alertness: Awake/alert Behavior During Therapy: WFL for tasks assessed/performed Overall Cognitive Status: Within Functional Limits for tasks assessed                                        General Comments      Exercises     Assessment/Plan    PT Assessment Patent does not need any further PT services  PT Problem List         PT Treatment Interventions      PT Goals (Current goals can be found in the Care Plan section)  Acute Rehab PT Goals Patient Stated Goal: Regain full function PT Goal Formulation: With patient Time For Goal Achievement: 06/03/20 Potential to Achieve Goals: Good    Frequency     Barriers to discharge        Co-evaluation PT/OT/SLP Co-Evaluation/Treatment: Yes Reason for Co-Treatment: To address functional/ADL transfers PT goals addressed during session: Mobility/safety with mobility OT goals addressed during session: ADL's and self-care       AM-PAC PT "6 Clicks" Mobility  Outcome Measure Help needed turning from your back to your side while in a flat bed without using bedrails?: None Help needed moving from lying on your back to sitting on the side of a flat bed without using bedrails?: None Help needed moving to and from a bed to a chair (including a wheelchair)?: None Help needed standing up from a chair using your arms (e.g., wheelchair or bedside chair)?: None Help  needed to walk in hospital room?: None Help needed climbing 3-5 steps with a railing? : None 6 Click Score: 24    End of Session Equipment Utilized During Treatment: Gait belt Activity Tolerance: Patient tolerated treatment well;No increased pain Patient left: in bed;with call bell/phone within reach Nurse Communication: Mobility status PT Visit Diagnosis: Difficulty in walking, not elsewhere classified (R26.2)    Time: 1012-1026 PT Time Calculation (min) (ACUTE ONLY): 14 min   Charges:   PT Evaluation $PT Eval Low Complexity: 1 Low          Mauro Kaufmann PT Acute Rehabilitation Services Pager [phone] Office [phone]   [facility] 05/27/2020, 5:39 PM

## 2020-05-27 NOTE — H&P (Signed)
History and Physical    Sarah Evans ZOX:[id number] DOB: 1980/04/29 DOA: 05/26/2020  PCP: Storm Frisk, MD  Patient coming from:  Home   Chief Complaint:  Chief Complaint  Patient presents with  . Numbness     HPI:    40 year old female with past medical history of gastroesophageal reflux disease, hyperlipidemia, prediabetes, schizoaffective disorder and recent ischemic stroke of the right frontal and occipital region who presents to [facility] emergency department with new onset numbness.   Of note, patient was admitted to [facility] from 5/18 until 5/20.  During that hospitalization, patient was diagnosed with a stroke of the right frontal and occipital region.  At time of discharge, the majority of the patient's neurologic deficits had completely returned to baseline.  Patient was eventually discharged on 5/20 on aspirin and Plavix therapy.   Patient explains that she woke from sleep the morning of 5/21 and noted that she was experiencing right foot, right arm, and right facial numbness.  Patient admits to associated blurry vision.  Patient denies associated weakness of the extremities.  Patient does complain of associated headache, located along the left side of the head, severe in intensity patient denies any slurring complaints of numbness to the right foot, right arm, right leg and right face.  Patient reports episodes of associated palpitations.  Patient's symptoms persisted for over an hour and the patient eventually presented to [facility] emergency department for evaluation.  Upon evaluation in the emergency department, noncontrast CT scan of the head redemonstrated the right frontal lobe recent infarction without evidence of hemorrhage or mass.  Patient was evaluated by Dr. Laurene Footman with telehealth neurology.  Patient was deemed to not be a candidate for tPA due to recent CVA.  It is recommended that patient be admitted to the stroke/telemetry floor  for further evaluation, serial neurologic checks and permissive hypertension with antiplatelet therapy.  The hospitalist group was then called to assess the patient for admission to the hospital.  Review of Systems:   Review of Systems  Neurological: Positive for sensory change and headaches.    Past Medical History:  Diagnosis Date  . Bipolar 1 disorder (HCC)   . Herpes simplex virus (HSV) infection    dx 2001  . Polycystic ovary disease   . Schizophrenia [facility])     Past Surgical History:  Procedure Laterality Date  . BUBBLE STUDY  05/25/2020   Procedure: BUBBLE STUDY;  Surgeon: Chrystie Nose, MD;  Location: [facility] ENDOSCOPY;  Service: Cardiovascular;;  . NO PAST SURGERIES    . TEE WITHOUT CARDIOVERSION N/A 05/25/2020   Procedure: TRANSESOPHAGEAL ECHOCARDIOGRAM (TEE);  Surgeon: Chrystie Nose, MD;  Location: [facility] ENDOSCOPY;  Service: Cardiovascular;  Laterality: N/A;     reports that she quit smoking about 3 weeks ago. Her smoking use included cigarettes. She has a 3.75 pack-year smoking history. She has never used smokeless tobacco. She reports that she does not drink alcohol and does not use drugs.  No Known Allergies  Family History  Problem Relation Age of Onset  . Diabetes Maternal Grandmother   . Bipolar disorder Mother   . Hypertension Neg Hx   . Heart disease Neg Hx   . Cancer Neg Hx      Prior to Admission medications   Medication Sig Start Date End Date Taking? Authorizing Provider  aspirin 81 MG EC tablet Take 1 tablet (81 mg total) by mouth daily. Swallow whole. 05/26/20 06/25/20 Yes Jerald Kief,  MD  atorvastatin (LIPITOR) 10 MG tablet Take 1 tablet (10 mg total) by mouth daily. 04/12/20  Yes Storm Frisk, MD  busPIRone (BUSPAR) 15 MG tablet Take 1 tablet (15 mg total) by mouth 3 (three) times daily. 12/06/19  Yes Toy Cookey E, NP  clobetasol cream (TEMOVATE) 0.05 % Apply 1 application topically 2 (two) times daily. 04/12/20  Yes Storm Frisk, MD   clopidogrel (PLAVIX) 75 MG tablet Take 1 tablet (75 mg total) by mouth daily for 21 days. 05/26/20 06/16/20 Yes Jerald Kief, MD  cyclobenzaprine (FLEXERIL) 10 MG tablet Take 10 mg by mouth daily as needed for muscle spasms.   Yes [provider]  metFORMIN (GLUCOPHAGE) 500 MG tablet Take 1 tablet (500 mg total) by mouth daily with breakfast. 04/12/20  Yes Storm Frisk, MD  Multiple Vitamin (MULTIVITAMIN WITH MINERALS) TABS tablet Take 1 tablet by mouth daily.   Yes [provider]  norgestimate-ethinyl estradiol (MILI) 0.25-35 MG-MCG tablet Take 1 tablet by mouth daily. 04/02/20  Yes Alric Seton, MD  pantoprazole (PROTONIX) 20 MG tablet Take 1 tablet (20 mg total) by mouth daily. 05/16/20  Yes Adam Phenix, MD  polyethylene glycol powder (GLYCOLAX/MIRALAX) 17 GM/SCOOP powder Take 17 g by mouth 2 (two) times daily as needed. Patient taking differently: Take 17 g by mouth 2 (two) times daily as needed for mild constipation. 04/14/20  Yes Storm Frisk, MD  prazosin (MINIPRESS) 1 MG capsule Take 1 capsule (1 mg total) by mouth at bedtime. Patient taking differently: Take 1 mg by mouth at bedtime as needed (nightmares). 12/06/19  Yes Toy Cookey E, NP  QUEtiapine (SEROQUEL) 100 MG tablet Take 1 tablet (100 mg total) by mouth at bedtime. 12/06/19  Yes Toy Cookey E, NP  QUEtiapine (SEROQUEL) 25 MG tablet Take 1 tablet (25 mg total) by mouth 2 (two) times daily. 12/06/19  Yes Toy Cookey E, NP  valACYclovir (VALTREX) 1000 MG tablet Take 1 tablet (1,000 mg total) by mouth daily. 05/21/20  Yes Storm Frisk, MD    Physical Exam: Vitals:   05/27/20 0315 05/27/20 0318 05/27/20 0623 05/27/20 0630  BP: 125/77  118/80 124/72  Pulse: 66  75 66  Resp: 15 17 17 18   Temp:      TempSrc:      SpO2: 99%  100% 98%  Weight:      Height:        Constitutional: Awake alert and oriented x3, no associated distress.   Skin: no rashes, no lesions, good skin  turgor noted. Eyes: Pupils are equally reactive to light.  No evidence of scleral icterus or conjunctival pallor.  ENMT: Moist mucous membranes noted.  Posterior pharynx clear of any exudate or lesions.   Neck: normal, supple, no masses, no thyromegaly.  No evidence of jugular venous distension.   Respiratory: clear to auscultation bilaterally, no wheezing, no crackles. Normal respiratory effort. No accessory muscle use.  Cardiovascular: Regular rate and rhythm, no murmurs / rubs / gallops. No extremity edema. 2+ pedal pulses. No carotid bruits.  Chest:   Nontender without crepitus or deformity.   Back:   Nontender without crepitus or deformity. Abdomen: Abdomen is soft and nontender.  No evidence of intra-abdominal masses.  Positive bowel sounds noted in all quadrants.   Musculoskeletal: No joint deformity upper and lower extremities. Good ROM, no contractures. Normal muscle tone.  Neurologic: CN 2-12 grossly intact. Sensation intact.  Patient moving all 4 extremities spontaneously.  Patient  is following all commands.  Patient is responsive to verbal stimuli.   Psychiatric: Patient exhibits normal mood with appropriate affect.  Patient seems to possess insight as to their current situation.     Labs on Admission: I have personally reviewed following labs and imaging studies -   CBC: Recent Labs  Lab 05/23/20 0833 05/24/20 0332 05/27/20 0026 05/27/20 0050  WBC 7.8 6.1 7.4  --   NEUTROABS 5.3  --  3.2  --   HGB 13.5 12.9 13.5 13.9  HCT 41.9 40.2 42.7 41.0  MCV 86.6 86.3 85.6  --   PLT 332 313 334  --    Basic Metabolic Panel: Recent Labs  Lab 05/21/20 0931 05/23/20 0833 05/24/20 0332 05/27/20 0026 05/27/20 0050  NA 139 140 137 139 140  K 4.4 3.7 3.9 3.0* 3.4*  CL 105 108 109 108 108  CO2 18* 22 22 22   --   GLUCOSE 86 93 94 107* 101*  BUN 9 13 10 13 15   CREATININE 1.07* 0.86 0.99 1.05* 1.00  CALCIUM 9.1 9.2 8.5* 9.3  --    GFR: Estimated Creatinine Clearance: 85.1 mL/min  (by C-G formula based on SCr of 1 mg/dL). Liver Function Tests: Recent Labs  Lab 05/23/20 0833 05/27/20 0026  AST 21 22  ALT 35 33  ALKPHOS 65 75  BILITOT 0.6 0.5  PROT 7.1 7.9  ALBUMIN 3.6 4.0   No results for input(s): LIPASE, AMYLASE in the last 168 hours. No results for input(s): AMMONIA in the last 168 hours. Coagulation Profile: Recent Labs  Lab 05/23/20 0833 05/27/20 0026  INR 1.0 0.9   Cardiac Enzymes: No results for input(s): CKTOTAL, CKMB, CKMBINDEX, TROPONINI in the last 168 hours. BNP (last 3 results) No results for input(s): PROBNP in the last 8760 hours. HbA1C: No results for input(s): HGBA1C in the last 72 hours. CBG: Recent Labs  Lab 05/24/20 2131 05/25/20 0751 05/25/20 1151 05/25/20 1614 05/27/20 0025  GLUCAP 121* 87 170* 102* 99   Lipid Profile: No results for input(s): CHOL, HDL, LDLCALC, TRIG, CHOLHDL, LDLDIRECT in the last 72 hours. Thyroid Function Tests: No results for input(s): TSH, T4TOTAL, FREET4, T3FREE, THYROIDAB in the last 72 hours. Anemia Panel: No results for input(s): VITAMINB12, FOLATE, FERRITIN, TIBC, IRON, RETICCTPCT in the last 72 hours. Urine analysis:    Component Value Date/Time   COLORURINE RED (A) 05/27/2020 0102   APPEARANCEUR CLOUDY (A) 05/27/2020 0102   LABSPEC 1.039 (H) 05/27/2020 0102   PHURINE 5.0 05/27/2020 0102   GLUCOSEU NEGATIVE 05/27/2020 0102   HGBUR LARGE (A) 05/27/2020 0102   BILIRUBINUR NEGATIVE 05/27/2020 0102   KETONESUR 5 (A) 05/27/2020 0102   PROTEINUR 100 (A) 05/27/2020 0102   NITRITE NEGATIVE 05/27/2020 0102   LEUKOCYTESUR NEGATIVE 05/27/2020 0102    Radiological Exams on Admission - Personally Reviewed: MR BRAIN WO CONTRAST  Result Date: 05/27/2020 CLINICAL DATA:  40 year old female code stroke presentation on 05/23/2020 with MRI revealing scattered restricted diffusion/infarcts in the right MCA and right PCA territories. EXAM: MRI HEAD WITHOUT CONTRAST TECHNIQUE: Multiplanar, multiecho  pulse sequences of the brain and surrounding structures were obtained without intravenous contrast. COMPARISON:  Brain MRI without and with contrast 05/23/2020. CTA head and neck 05/24/2020. FINDINGS: Brain: Confluent roughly 3.5 cm area of restricted diffusion in the right middle frontal gyrus along the anterior operculum, as well as multiple scattered areas of cortical restricted diffusion in the right perirolandic cortex, right parietal lobe, and right occipital lobe are stable to mildly  diminished since the MRI 4 days ago. No new diffusion restriction. Stable cytotoxic edema at the affected areas. Stable petechial hemorrhage in the dominant infarct. No significant intracranial mass effect. No new signal abnormality identified. No chronic encephalomalacia or other intracranial blood products identified. No midline shift, mass effect, evidence of mass lesion, ventriculomegaly, extra-axial collection. Cervicomedullary junction and pituitary are within normal limits. Vascular: Major intracranial vascular flow voids are stable. Skull and upper cervical spine: Negative. Sinuses/Orbits: Negative. Other: Mastoids remain clear. Stable visible orbits soft tissues. Negative visible scalp and face. IMPRESSION: 1. Expected evolution of scattered right MCA and PCA infarcts over the past 4 days. Stable petechial hemorrhage in the dominant right middle frontal gyrus infarct. No significant mass effect. 2. No new intracranial abnormality. Electronically Signed   By: Odessa Fleming M.D.   On: 05/27/2020 08:47   ECHO TEE  Result Date: 05/25/2020    TRANSESOPHOGEAL ECHO REPORT   Patient Name:   Sarah Evans Date of Exam: 05/25/2020 Medical Rec #:  [id number]     Height:       67.0 in Accession #:    [id number]    Weight:       186.9 lb Date of Birth:  23-Jul-1980     BSA:          1.965 m Patient Age:    40 years      BP:           118/82 mmHg Patient Gender: F             HR:           61 bpm. Exam Location:  Inpatient Procedure: 2D  Echo, Cardiac Doppler, Color Doppler and Saline Contrast Bubble            Study Indications:     Stroke  History:         Patient has no prior history of Echocardiogram examinations.  Sonographer:     Roosvelt Maser RDCS Referring Phys:  [id number] HAO MENG Diagnosing Phys: Zoila Shutter MD PROCEDURE: The transesophogeal probe was passed without difficulty through the esophogus of the patient. Local oropharyngeal anesthetic was provided with Cetacaine. Sedation performed by different physician. The patient was monitored while under deep sedation. The patient developed no complications during the procedure. IMPRESSIONS  1. Left ventricular ejection fraction, by estimation, is 60 to 65%. The left ventricle has normal function.  2. Right ventricular systolic function is normal. The right ventricular size is normal.  3. No left atrial/left atrial appendage thrombus was detected.  4. The mitral valve is grossly normal. Trivial mitral valve regurgitation.  5. The aortic valve is tricuspid. Aortic valve regurgitation is not visualized.  6. Evidence of atrial level shunting detected by color flow Doppler. Agitated saline contrast bubble study was positive with shunting observed after >6 cardiac cycles suggestive of intrapulmonary shunting. Conclusion(s)/Recommendation(s): No LA/LAA thrombus identified. Negative bubble study for interatrial shunt. No intracardiac source of embolism detected on this on this transesophageal echocardiogram. FINDINGS  Left Ventricle: Left ventricular ejection fraction, by estimation, is 60 to 65%. The left ventricle has normal function. The left ventricular internal cavity size was normal in size. Right Ventricle: The right ventricular size is normal. No increase in right ventricular wall thickness. Right ventricular systolic function is normal. Left Atrium: Left atrial size was normal in size. Spontaneous echo contrast was present. No left atrial/left atrial appendage thrombus was detected. Right  Atrium: Right atrial size was normal in size. Pericardium:  There is no evidence of pericardial effusion. Mitral Valve: The mitral valve is grossly normal. Trivial mitral valve regurgitation. Tricuspid Valve: The tricuspid valve is grossly normal. Tricuspid valve regurgitation is trivial. Aortic Valve: The aortic valve is tricuspid. Aortic valve regurgitation is not visualized. Pulmonic Valve: The pulmonic valve was grossly normal. Pulmonic valve regurgitation is not visualized. Aorta: The aortic root and ascending aorta are structurally normal, with no evidence of dilitation. IAS/Shunts: Evidence of atrial level shunting detected by color flow Doppler. Agitated saline contrast was given intravenously to evaluate for intracardiac shunting. Agitated saline contrast bubble study was positive with shunting observed after >6 cardiac cycles suggestive of intrapulmonary shunting. Zoila Shutter MD Electronically signed by Zoila Shutter MD Signature Date/Time: 05/25/2020/3:32:22 PM    Final    CT HEAD CODE STROKE WO CONTRAST  Result Date: 05/27/2020 CLINICAL DATA:  Code stroke.  Numbness of the right hand and foot. EXAM: CT HEAD WITHOUT CONTRAST TECHNIQUE: Contiguous axial images were obtained from the base of the skull through the vertex without intravenous contrast. COMPARISON:  None. FINDINGS: Brain: There is no mass, hemorrhage or extra-axial collection. The size and configuration of the ventricles and extra-axial CSF spaces are normal. There is a recent infarction within the right frontal lobe. No abnormality within the left hemisphere. Vascular: No abnormal hyperdensity of the major intracranial arteries or dural venous sinuses. No intracranial atherosclerosis. Skull: The visualized skull base, calvarium and extracranial soft tissues are normal. Sinuses/Orbits: No fluid levels or advanced mucosal thickening of the visualized paranasal sinuses. No mastoid or middle ear effusion. The orbits are normal. ASPECTS  [facility] Stroke Program Early CT Score) -- left hemisphere - Ganglionic level infarction (caudate, lentiform nuclei, internal capsule, insula, M1-M3 cortex): 7 - Supraganglionic infarction (M4-M6 cortex): 3 Total score (0-10 with 10 being normal): 10 IMPRESSION: 1. Right frontal lobe recent infarction without hemorrhage or mass effect. 2. ASPECTS is 10. These results were called by telephone at the time of interpretation on 05/27/2020 at 12:47 am to provider [facility] , who verbally acknowledged these results. Electronically Signed   By: Deatra Robinson M.D.   On: 05/27/2020 00:47    EKG: Personally reviewed.  Rhythm is normal sinus rhythm with heart rate of 88 bpm.  No dynamic ST segment changes appreciated.  Assessment/Plan Principal Problem:   Numbness and tingling   New onset right-sided numbness and tingling shortly after being diagnosed with and treated for ischemic strokes of the right frontal and occipital regions.  Obtaining repeat MRI brain without contrast  Per neurology's recommendation, admitting patient to telemetry unit at [facility] so that patient to be rounded on by the stroke team  Patient has been switched to Aggrenox per neurology recommendations for antiplatelet therapy  Monitoring patient on telemetry  Serial neurologic checks  PT, OT, SLP evaluation  Permissive hypertension   Active Problems:   Schizoaffective disorder, bipolar type (HCC)   Continue home regimen of psychotropic medications    Mixed hyperlipidemia   Continue home regimen of atorvastatin 10 mg daily, LDL seems to be well under 70 per review of available during last hospitalization    History of ischemic stroke   Presence of recent ischemic stroke is complicating presentation    GERD without esophagitis   Continue home regimen of Protonix   Code Status:  Full code Family Communication: deferred   Status is: Observation  The patient remains OBS appropriate and will d/c  before 2 midnights.  Dispo: The patient is from: Home  Anticipated d/c is to: Home              Patient currently is not medically stable to d/c.   Difficult to place patient No        Marinda Elk MD [facility] Pager [phone]  If 7PM-7AM, please contact night-coverage [email or web address] Use universal [place] password for that web site. If you do not have the password, please call the hospital operator.  05/27/2020, 8:53 AM

## 2020-05-27 NOTE — ED Notes (Signed)
Patient transported to CT 

## 2020-05-27 NOTE — ED Provider Notes (Signed)
[facility]-EMERGENCY DEPT Provider Note   CSN: [id number] Arrival date & time: 05/26/20  2349  An emergency department physician performed an initial assessment on this suspected stroke patient at 0019.  History Chief Complaint  Patient presents with  . Numbness    Sarah Evans is a 40 y.o. female.  Patient presents chief complaint of right face right arm right leg numbness tingling sensation.  She states it started about 20 minutes prior to arrival.  Describes as persistent tingling numbness sensation, reminds her of her prior stroke just 1 or 2 weeks ago that affected her left side with tingling and numbness and weakness of grip.  She denies headache or chest pain or abdominal pain.  Denies fevers cough vomiting or diarrhea.  She was recently started on Plavix for her recent stroke.        Past Medical History:  Diagnosis Date  . Bipolar 1 disorder (HCC)   . Herpes simplex virus (HSV) infection    dx 2001  . Polycystic ovary disease   . Schizophrenia [facility])     Patient Active Problem List   Diagnosis Date Noted  . Stroke (HCC) 05/24/2020  . Ischemic stroke (HCC) 05/23/2020  . BMI 29.0-29.9,adult 05/21/2020  . HSV (herpes simplex virus) anogenital infection 05/21/2020  . Controlled type 2 diabetes mellitus (HCC) 04/12/2020  . Intrinsic eczema 04/12/2020  . Hyperlipidemia associated with type 2 diabetes mellitus (HCC) 04/12/2020  . Muscle spasticity 04/12/2020  . Moderate episode of recurrent major depressive disorder (HCC) 06/09/2019  . Generalized anxiety disorder 06/09/2019  . Bipolar disorder, current episode depressed, moderate (HCC) 04/28/2018  . Personal history of nonsuicidal self-harm 04/28/2018  . PTSD (post-traumatic stress disorder) 04/30/2015  . Schizoaffective disorder, bipolar type (HCC) 04/27/2015    Past Surgical History:  Procedure Laterality Date  . BUBBLE STUDY  05/25/2020   Procedure: BUBBLE STUDY;  Surgeon: Chrystie Nose, MD;  Location: [facility] ENDOSCOPY;  Service: Cardiovascular;;  . NO PAST SURGERIES    . TEE WITHOUT CARDIOVERSION N/A 05/25/2020   Procedure: TRANSESOPHAGEAL ECHOCARDIOGRAM (TEE);  Surgeon: Chrystie Nose, MD;  Location: [facility] ENDOSCOPY;  Service: Cardiovascular;  Laterality: N/A;     OB History    Gravida  1   Para  1   Term  0   Preterm  1   AB  0   Living  0     SAB  0   IAB  0   Ectopic  0   Multiple  0   Live Births  1           Family History  Problem Relation Age of Onset  . Diabetes Maternal Grandmother   . Bipolar disorder Mother   . Hypertension Neg Hx   . Heart disease Neg Hx   . Cancer Neg Hx     Social History   Tobacco Use  . Smoking status: Former Smoker    Packs/day: 0.25    Years: 15.00    Pack years: 3.75    Types: Cigarettes    Quit date: 05/02/2020    Years since quitting: 0.0  . Smokeless tobacco: Never Used  Vaping Use  . Vaping Use: Never used  Substance Use Topics  . Alcohol use: No    Comment: occ  . Drug use: No    Home Medications Prior to Admission medications   Medication Sig Start Date End Date Taking? Authorizing Provider  aspirin 81 MG EC tablet Take 1 tablet (81  mg total) by mouth daily. Swallow whole. 05/26/20 06/25/20 Yes Jerald Kief, MD  atorvastatin (LIPITOR) 10 MG tablet Take 1 tablet (10 mg total) by mouth daily. 04/12/20  Yes Storm Frisk, MD  busPIRone (BUSPAR) 15 MG tablet Take 1 tablet (15 mg total) by mouth 3 (three) times daily. 12/06/19  Yes Toy Cookey E, NP  clobetasol cream (TEMOVATE) 0.05 % Apply 1 application topically 2 (two) times daily. 04/12/20  Yes Storm Frisk, MD  clopidogrel (PLAVIX) 75 MG tablet Take 1 tablet (75 mg total) by mouth daily for 21 days. 05/26/20 06/16/20 Yes Jerald Kief, MD  cyclobenzaprine (FLEXERIL) 10 MG tablet Take 10 mg by mouth daily as needed for muscle spasms.   Yes [provider]  metFORMIN (GLUCOPHAGE) 500 MG tablet Take 1 tablet (500 mg  total) by mouth daily with breakfast. 04/12/20  Yes Storm Frisk, MD  Multiple Vitamin (MULTIVITAMIN WITH MINERALS) TABS tablet Take 1 tablet by mouth daily.   Yes [provider]  norgestimate-ethinyl estradiol (MILI) 0.25-35 MG-MCG tablet Take 1 tablet by mouth daily. 04/02/20  Yes Alric Seton, MD  pantoprazole (PROTONIX) 20 MG tablet Take 1 tablet (20 mg total) by mouth daily. 05/16/20  Yes Adam Phenix, MD  polyethylene glycol powder (GLYCOLAX/MIRALAX) 17 GM/SCOOP powder Take 17 g by mouth 2 (two) times daily as needed. Patient taking differently: Take 17 g by mouth 2 (two) times daily as needed for mild constipation. 04/14/20  Yes Storm Frisk, MD  prazosin (MINIPRESS) 1 MG capsule Take 1 capsule (1 mg total) by mouth at bedtime. Patient taking differently: Take 1 mg by mouth at bedtime as needed (nightmares). 12/06/19  Yes Toy Cookey E, NP  QUEtiapine (SEROQUEL) 100 MG tablet Take 1 tablet (100 mg total) by mouth at bedtime. 12/06/19  Yes Toy Cookey E, NP  QUEtiapine (SEROQUEL) 25 MG tablet Take 1 tablet (25 mg total) by mouth 2 (two) times daily. 12/06/19  Yes Toy Cookey E, NP  valACYclovir (VALTREX) 1000 MG tablet Take 1 tablet (1,000 mg total) by mouth daily. 05/21/20  Yes Storm Frisk, MD    Allergies    Patient has no known allergies.  Review of Systems   Review of Systems  Constitutional: Negative for fever.  HENT: Negative for ear pain.   Eyes: Negative for pain.  Respiratory: Negative for cough.   Cardiovascular: Negative for chest pain.  Gastrointestinal: Negative for abdominal pain.  Genitourinary: Negative for flank pain.  Musculoskeletal: Negative for back pain.  Skin: Negative for rash.  Neurological: Negative for headaches.    Physical Exam Updated Vital Signs BP 117/78   Pulse 75   Temp 98.5 F (36.9 C)   Resp 19   Ht  (1.702 m)   Wt 84.4 kg   LMP 05/21/2020   SpO2 100%   BMI 29.13 kg/m   Physical  Exam Constitutional:      General: She is not in acute distress.    Appearance: Normal appearance.  HENT:     Head: Normocephalic.     Nose: Nose normal.  Eyes:     Extraocular Movements: Extraocular movements intact.  Cardiovascular:     Rate and Rhythm: Normal rate.  Pulmonary:     Effort: Pulmonary effort is normal.  Musculoskeletal:        General: Normal range of motion.     Cervical back: Normal range of motion.  Neurological:     Mental Status: She is  alert.     Comments: Cranial nerves II to XII intact.  Gait is normal and intact.  No upper extremity or lower extremity drift noticed.  Some weakness of the bilateral grip strength present approximately 4 out of 5 strength.  Patient complaining of paresthesia to right upper extremity and right side of the face.     ED Results / Procedures / Treatments   Labs (all labs ordered are listed, but only abnormal results are displayed) Labs Reviewed  COMPREHENSIVE METABOLIC PANEL - Abnormal; Notable for the following components:      Result Value   Potassium 3.0 (*)    Glucose, Bld 107 (*)    Creatinine, Ser 1.05 (*)    All other components within normal limits  I-STAT CHEM 8, ED - Abnormal; Notable for the following components:   Potassium 3.4 (*)    Glucose, Bld 101 (*)    Calcium, Ion 1.11 (*)    All other components within normal limits  RESP PANEL BY RT-PCR (FLU A&B, COVID) ARPGX2  PROTIME-INR  APTT  CBC  DIFFERENTIAL  RAPID URINE DRUG SCREEN, HOSP PERFORMED  ETHANOL  URINALYSIS, ROUTINE W REFLEX MICROSCOPIC  CBG MONITORING, ED  I-STAT BETA HCG BLOOD, ED ([facility], [facility], [facility] ONLY)    EKG None  Radiology ECHO TEE  Result Date: 05/25/2020    TRANSESOPHOGEAL ECHO REPORT   Patient Name:   Sarah Evans Date of Exam: 05/25/2020 Medical Rec #:  [id number]     Height:       67.0 in Accession #:    [id number]    Weight:       186.9 lb Date of Birth:  1980-08-16     BSA:          1.965 m Patient Age:    40 years      BP:            118/82 mmHg Patient Gender: F             HR:           61 bpm. Exam Location:  Inpatient Procedure: 2D Echo, Cardiac Doppler, Color Doppler and Saline Contrast Bubble            Study Indications:     Stroke  History:         Patient has no prior history of Echocardiogram examinations.  Sonographer:     Roosvelt Maser RDCS Referring Phys:  [id number] HAO MENG Diagnosing Phys: Zoila Shutter MD PROCEDURE: The transesophogeal probe was passed without difficulty through the esophogus of the patient. Local oropharyngeal anesthetic was provided with Cetacaine. Sedation performed by different physician. The patient was monitored while under deep sedation. The patient developed no complications during the procedure. IMPRESSIONS  1. Left ventricular ejection fraction, by estimation, is 60 to 65%. The left ventricle has normal function.  2. Right ventricular systolic function is normal. The right ventricular size is normal.  3. No left atrial/left atrial appendage thrombus was detected.  4. The mitral valve is grossly normal. Trivial mitral valve regurgitation.  5. The aortic valve is tricuspid. Aortic valve regurgitation is not visualized.  6. Evidence of atrial level shunting detected by color flow Doppler. Agitated saline contrast bubble study was positive with shunting observed after >6 cardiac cycles suggestive of intrapulmonary shunting. Conclusion(s)/Recommendation(s): No LA/LAA thrombus identified. Negative bubble study for interatrial shunt. No intracardiac source of embolism detected on this on this transesophageal echocardiogram. FINDINGS  Left Ventricle: Left ventricular ejection fraction,  by estimation, is 60 to 65%. The left ventricle has normal function. The left ventricular internal cavity size was normal in size. Right Ventricle: The right ventricular size is normal. No increase in right ventricular wall thickness. Right ventricular systolic function is normal. Left Atrium: Left atrial size was normal in  size. Spontaneous echo contrast was present. No left atrial/left atrial appendage thrombus was detected. Right Atrium: Right atrial size was normal in size. Pericardium: There is no evidence of pericardial effusion. Mitral Valve: The mitral valve is grossly normal. Trivial mitral valve regurgitation. Tricuspid Valve: The tricuspid valve is grossly normal. Tricuspid valve regurgitation is trivial. Aortic Valve: The aortic valve is tricuspid. Aortic valve regurgitation is not visualized. Pulmonic Valve: The pulmonic valve was grossly normal. Pulmonic valve regurgitation is not visualized. Aorta: The aortic root and ascending aorta are structurally normal, with no evidence of dilitation. IAS/Shunts: Evidence of atrial level shunting detected by color flow Doppler. Agitated saline contrast was given intravenously to evaluate for intracardiac shunting. Agitated saline contrast bubble study was positive with shunting observed after >6 cardiac cycles suggestive of intrapulmonary shunting. Zoila Shutter MD Electronically signed by Zoila Shutter MD Signature Date/Time: 05/25/2020/3:32:22 PM    Final    CT HEAD CODE STROKE WO CONTRAST  Result Date: 05/27/2020 CLINICAL DATA:  Code stroke.  Numbness of the right hand and foot. EXAM: CT HEAD WITHOUT CONTRAST TECHNIQUE: Contiguous axial images were obtained from the base of the skull through the vertex without intravenous contrast. COMPARISON:  None. FINDINGS: Brain: There is no mass, hemorrhage or extra-axial collection. The size and configuration of the ventricles and extra-axial CSF spaces are normal. There is a recent infarction within the right frontal lobe. No abnormality within the left hemisphere. Vascular: No abnormal hyperdensity of the major intracranial arteries or dural venous sinuses. No intracranial atherosclerosis. Skull: The visualized skull base, calvarium and extracranial soft tissues are normal. Sinuses/Orbits: No fluid levels or advanced mucosal thickening  of the visualized paranasal sinuses. No mastoid or middle ear effusion. The orbits are normal. ASPECTS [facility] Stroke Program Early CT Score) -- left hemisphere - Ganglionic level infarction (caudate, lentiform nuclei, internal capsule, insula, M1-M3 cortex): 7 - Supraganglionic infarction (M4-M6 cortex): 3 Total score (0-10 with 10 being normal): 10 IMPRESSION: 1. Right frontal lobe recent infarction without hemorrhage or mass effect. 2. ASPECTS is 10. These results were called by telephone at the time of interpretation on 05/27/2020 at 12:47 am to provider [facility] , who verbally acknowledged these results. Electronically Signed   By: Deatra Robinson M.D.   On: 05/27/2020 00:47    Procedures .Critical Care E&M Performed by: Cheryll Cockayne, MD  Critical care provider statement:    Critical care time (minutes):  30   Critical care time was exclusive of:  Separately billable procedures and treating other patients   Critical care was necessary to treat or prevent imminent or life-threatening deterioration of the following conditions:  CNS failure or compromise After initial E/M assessment, critical care services were subsequently performed that were exclusive of separately billable procedures or treatment.       Medications Ordered in ED Medications  sodium chloride (PF) 0.9 % injection (  Given 05/27/20 0105)    ED Course  I have reviewed the triage vital signs and the nursing notes.  Pertinent labs & imaging results that were available during my care of the patient were reviewed by me and considered in my medical decision making (see chart for details).  MDM Rules/Calculators/A&P                          Given her recent subacute strokes and recurrent new symptoms that started 20 minutes prior to arrival, stroke alert was activated.  Patient evaluated by the neurology team, no tPA recommended given sensory symptoms only.  She recently had subacute stroke, will be admitted back to  the hospitalist team for MRI work-up and reevaluation with the neurology team.   Final Clinical Impression(s) / ED Diagnoses Final diagnoses:  Stroke-like episode    Rx / DC Orders ED Discharge Orders    None       Cheryll CockayneHong, Derran Sear S, MD 05/27/20 [phone]

## 2020-05-27 NOTE — ED Notes (Signed)
LNW 2200 on 05/26/20. Pt reporting R arm, face, leg, and toe weakness and numbness, and headache. Stroke cart at bedside.

## 2020-05-27 NOTE — Consult Note (Signed)
[facility] Tele[facility]urology Consult Services   Date of Service:   05/27/2020 00:30:51  Diagnosis:     .  I63.9 - Cerebrovascular accident (CVA), unspecified mechanism (HCC)  Impression:     . 40yoF hx of recent CVA at right frontal and occipital region, discharged 5/20. Woke up with numbness at right arm, face, and toe as well as headache. CTH did not show acute change on left hemisphere. Not tPa candidate due to recent CVA.              Recommend admission for stroke workup. If not obtain from last admission, consider hypercoag lab.  Metrics: Last Known Well: 05/26/2020 22:00:00 [facility] Notification Time: 05/27/2020 00:30:50 Arrival Time: 05/26/2020 23:49:00 Stamp Time: 05/27/2020 00:30:51 Initial Response Time: 05/27/2020 00:32:45 Symptoms: right sided numbness and headache . NIHSS Start Assessment Time: 05/27/2020 00:38:46 Patient is not a candidate for Thrombolytic. Thrombolytic Medical Decision: 05/27/2020 00:34:48 Patient was not deemed candidate for Thrombolytic because of following reasons: Stroke in last 3 months.  CT head showed no acute hemorrhage or acute core infarct. CT head was reviewed.  ED Physician notified of diagnostic impression and management plan on 05/27/2020 01:02:45  Advanced Imaging: Advanced Imaging Not Recommended because:  Clinical presentation is not suggestion of LVO or Low clinical suspicion of LVO based on presentation   Our recommendations are outlined below.  Recommendations:      .  Stroke/Telemetry Floor     .  Neuro Checks     .  Bedside Swallow Eval     .  DVT Prophylaxis     .  IV Fluids, Normal Saline     .  Head of Bed 30 Degrees     .  Euglycemia and Avoid Hyperthermia (PRN Acetaminophen)     .  Initiate Aspirin/dipyridamole 25mg  IR/200 mg ER twice daily     .  Antihypertensives PRN if Blood pressure is greater than 220/120 or there is a concern for End organ damage/contraindications for permissive  HTN. If blood pressure is greater than 220/120 give labetalol PO or IV or Vasotec IV with a goal of 15% reduction in BP during the first 24 hours.  Routine Consultation with Inhouse Neurology for Follow up Care  Sign Out:     .  Discussed with Emergency Department Provider    ------------------------------------------------------------------------------  History of Present Illness: Patient is a 40 year old Female.  Patient was brought by private transportation with symptoms of right sided numbness and headache .  Patient was recently admitted for CVA from May 18 - 20. MRI brain from that admission showed right frontal and occipital stroke. She was discharged on 5/20 and been having headache most of the day. She went to sleep at 10PM at baseline. She woke up this morning and noticed numbness at right arm, face, and toe. She was discharged with aspirin and plavix. PMH: DM, HLD.    Anticoagulant use:  No  Antiplatelet use: Yes aspirin and plavix  Allergies:  Reviewed,NKDA     Examination: BP(143/101), Pulse(110), Blood Glucose(99) 1A: Level of Consciousness - Alert; keenly responsive + 0 1B: Ask Month and Age - Both Questions Right + 0 1C: Blink Eyes & Squeeze Hands - Performs Both Tasks + 0 2: Test Horizontal Extraocular Movements - Normal + 0 3: Test Visual Fields - No Visual Loss + 0 4: Test Facial Palsy (Use Grimace if Obtunded) - Normal symmetry + 0 5A: Test Left Arm Motor Drift - No Drift for 10 Seconds +  0 5B: Test Right Arm Motor Drift - No Drift for 10 Seconds + 0 6A: Test Left Leg Motor Drift - No Drift for 5 Seconds + 0 6B: Test Right Leg Motor Drift - No Drift for 5 Seconds + 0 7: Test Limb Ataxia (FNF/Heel-Shin) - No Ataxia + 0 8: Test Sensation - Mild-Moderate Loss: Can Sense Being Touched + 1 9: Test Language/Aphasia - Normal; No aphasia + 0 10: Test Dysarthria - Normal + 0 11: Test Extinction/Inattention - No abnormality + 0  NIHSS Score: 1  NIHSS Free Text  : decrease sensation right face and leg  Pre-Morbid Modified Rankin Scale: 0 Points = No symptoms at all   Patient/Family was informed the Neurology Consult would occur via TeleHealth consult by way of interactive audio and video telecommunications and consented to receiving care in this manner.   Patient is being evaluated for possible acute neurologic impairment and high probability of imminent or life-threatening deterioration. I spent total of 35 minutes providing care to this patient, including time for face to face visit via telemedicine, review of medical records, imaging studies and discussion of findings with providers, the patient and/or family.   Dr Larkin Ina   [facility] [phone]  Case [id number]

## 2020-05-27 NOTE — ED Notes (Signed)
Provider made aware of pt's sx.

## 2020-05-27 NOTE — ED Triage Notes (Signed)
Pt came in with c/o R hand and feet numbness. Also c/o headache. Pt had ischemic stroke on 5/18 and was released today. Pt states that her R eye is blurry and her face is feeling numb

## 2020-05-27 NOTE — Plan of Care (Signed)
Called by Dr. Hanley Ben. Patient seen by telemedicine neurology Recent right hemispheric stroke stroke with complete stroke work-up - still cryptogenic. This time presented with symptoms attributable to the other side. MRI done today-negative for acute stroke Due to the work-up recently having been completed, discharge patient home with outpatient follow-up- continue dual antiplatelets for 3 weeks. I would continue aspirin and Plavix. Plan discussed with Dr. Hanley Ben  -- Milon Dikes, MD Neurologist [facility] Pager: [phone]

## 2020-05-27 NOTE — Evaluation (Addendum)
Occupational Therapy Evaluation Patient Details Name: Sarah Evans MRN: [id number] DOB: 02-16-1980 Today's Date: 05/27/2020    History of Present Illness 40 year old female with past medical history of gastrosophageal reflux disease, hyperlipidemia, prediabetes, schizoaffective disorder and recent ischemic stroke of the right frontal and occipital region who presents emergency department with new onset numbness. Of note, patient was admitted to [facility] from 5/18 until 5/20.  During that hospitalization, patient was diagnosed with a stroke of the right frontal and occipital region.  At time of discharge, the majority of the patient's neurologic deficits had completely returned to baseline except for left hand weakness and numbness/tingling in fingers.  Patient was discharged on 5/20. Now presents with R sided numbness. MRI with no new abnormalities just expected evoluation of infarcts.   Clinical Impression   Patient returned to ER for new onset of right sided numbness. Had prior deficits from CVA in left hand and had discharged home with HEP and theraputty. Patient reports hand is getting better with HEP. ON evaluation patient continues to demonstrate decreased strength, ROM and coordination of left hand but no new deficits. Reports continued decreased sensation on right side of face and mild blurry vision but able to perform all ADLs and mobility independently. No complaints of other visual disturbances or cognitive impairment. Recommended patient continue HEP for left hand and follow up with OP OT at discharge. No further OT needs.    Follow Up Recommendations  Outpatient OT    Equipment Recommendations  None recommended by OT    Recommendations for Other Services       Precautions / Restrictions Precautions Precautions: None      Mobility Bed Mobility Overal bed mobility: Independent                  Transfers Overall transfer level: Independent                     Balance Overall balance assessment: Independent                                         ADL either performed or assessed with clinical judgement   ADL Overall ADL's : Modified independent                                             Vision Patient Visual Report: No change from baseline Vision Assessment?: No apparent visual deficits Additional Comments: Reports she had some blurry vision last night     Perception     Praxis      Pertinent Vitals/Pain Pain Assessment: No/denies pain     Hand Dominance Right   Extremity/Trunk Assessment Upper Extremity Assessment Upper Extremity Assessment: LUE deficits/detail;RUE deficits/detail RUE Deficits / Details: WNL ROM, 5/5 strength RUE Sensation: WNL RUE Coordination: WNL LUE Deficits / Details: WFL ROM, 5/5 shoulder and elbow strength, 4/5 wrist strength, decreased grasp strength compared to right, reports continues numbness in fingertips, finger movement bradykinetic LUE Sensation:  (grossly functional except or numbness in tips) LUE Coordination: decreased fine motor (movement slow)   Lower Extremity Assessment Lower Extremity Assessment: Defer to PT evaluation   Cervical / Trunk Assessment Cervical / Trunk Assessment: Normal   Communication Communication Communication: No difficulties   Cognition Arousal/Alertness: Awake/alert Behavior  During Therapy: WFL for tasks assessed/performed Overall Cognitive Status: Within Functional Limits for tasks assessed                                     General Comments       Exercises     Shoulder Instructions      Home Living Family/patient expects to be discharged to:: Private residence Living Arrangements: Alone (rent a room out of the house - other people are in the house) Available Help at Discharge: Family;Available PRN/intermittently Type of Home: House Home Access: Stairs to enter [employer]  of Steps: 2-3 Entrance Stairs-Rails:  (Unable to recall; may have rail on R or L) Home Layout: Two level Alternate Level Stairs-Number of Steps: Full flight   Bathroom Shower/Tub: Chief Strategy Officer: Standard     Home Equipment: Grab bars - tub/shower          Prior Functioning/Environment Level of Independence: Independent        Comments: I with ADLs/IADLs; driving; working as a Conservation officer, nature at [employer]        OT Problem List: Decreased strength;Decreased range of motion;Impaired sensation;Impaired UE functional use      OT Treatment/Interventions:      OT Goals(Current goals can be found in the care plan section) Acute Rehab OT Goals OT Goal Formulation: All assessment and education complete, DC therapy  OT Frequency:     Barriers to D/C:            Co-evaluation PT/OT/SLP Co-Evaluation/Treatment: Yes Reason for Co-Treatment: To address functional/ADL transfers PT goals addressed during session: Mobility/safety with mobility OT goals addressed during session: ADL's and self-care      AM-PAC OT "6 Clicks" Daily Activity     Outcome Measure Help from another person eating meals?: None Help from another person taking care of personal grooming?: None Help from another person toileting, which includes using toliet, bedpan, or urinal?: None Help from another person bathing (including washing, rinsing, drying)?: None Help from another person to put on and taking off regular upper body clothing?: None Help from another person to put on and taking off regular lower body clothing?: None 6 Click Score: 24   End of Session Equipment Utilized During Treatment: Gait belt Nurse Communication:  (okay to see)  Activity Tolerance: Patient tolerated treatment well Patient left: in bed;with call bell/phone within reach  OT Visit Diagnosis: Hemiplegia and hemiparesis Hemiplegia - Right/Left: Left Hemiplegia - dominant/non-dominant: Non-Dominant Hemiplegia  - caused by: Cerebral infarction                Time: 0630-1601 OT Time Calculation (min): 15 min Charges:  OT General Charges $OT Visit: 1 Visit OT Evaluation $OT Eval Low Complexity: 1 Low  Luciel Brickman, OTR/L Acute Care Rehab Services  Office [phone] Pager: [phone]   Kelli Churn 05/27/2020, 12:27 PM

## 2020-05-28 ENCOUNTER — Telehealth: Payer: Self-pay

## 2020-05-28 ENCOUNTER — Ambulatory Visit: Payer: Medicaid Other

## 2020-05-28 ENCOUNTER — Other Ambulatory Visit (HOSPITAL_COMMUNITY): Payer: Self-pay

## 2020-05-28 DIAGNOSIS — Z8673 Personal history of transient ischemic attack (TIA), and cerebral infarction without residual deficits: Secondary | ICD-10-CM

## 2020-05-28 DIAGNOSIS — F250 Schizoaffective disorder, bipolar type: Secondary | ICD-10-CM

## 2020-05-28 LAB — CBC WITH DIFFERENTIAL/PLATELET
Abs Immature Granulocytes: 0.01 10*3/uL (ref 0.00–0.07)
Basophils Absolute: 0.1 10*3/uL (ref 0.0–0.1)
Basophils Relative: 1 %
Eosinophils Absolute: 0.1 10*3/uL (ref 0.0–0.5)
Eosinophils Relative: 3 %
HCT: 41.1 % (ref 36.0–46.0)
Hemoglobin: 12.9 g/dL (ref 12.0–15.0)
Immature Granulocytes: 0 %
Lymphocytes Relative: 40 %
Lymphs Abs: 2.3 10*3/uL (ref 0.7–4.0)
MCH: 27.3 pg (ref 26.0–34.0)
MCHC: 31.4 g/dL (ref 30.0–36.0)
MCV: 87.1 fL (ref 80.0–100.0)
Monocytes Absolute: 0.6 10*3/uL (ref 0.1–1.0)
Monocytes Relative: 10 %
Neutro Abs: 2.6 10*3/uL (ref 1.7–7.7)
Neutrophils Relative %: 46 %
Platelets: 321 10*3/uL (ref 150–400)
RBC: 4.72 MIL/uL (ref 3.87–5.11)
RDW: 14.6 % (ref 11.5–15.5)
WBC: 5.6 10*3/uL (ref 4.0–10.5)
nRBC: 0 % (ref 0.0–0.2)

## 2020-05-28 LAB — BASIC METABOLIC PANEL
Anion gap: 9 (ref 5–15)
BUN: 13 mg/dL (ref 6–20)
CO2: 21 mmol/L — ABNORMAL LOW (ref 22–32)
Calcium: 8.8 mg/dL — ABNORMAL LOW (ref 8.9–10.3)
Chloride: 108 mmol/L (ref 98–111)
Creatinine, Ser: 1.04 mg/dL — ABNORMAL HIGH (ref 0.44–1.00)
GFR, Estimated: >60 mL/min (ref >60)
Glucose, Bld: 92 mg/dL (ref 70–99)
Potassium: 3.8 mmol/L (ref 3.5–5.1)
Sodium: 138 mmol/L (ref 135–145)

## 2020-05-28 LAB — CSF CULTURE W GRAM STAIN: Culture: NO GROWTH

## 2020-05-28 LAB — GLUCOSE, CAPILLARY
Glucose-Capillary: 108 mg/dL — ABNORMAL HIGH (ref 70–99)
Glucose-Capillary: 118 mg/dL — ABNORMAL HIGH (ref 70–99)

## 2020-05-28 LAB — PROTHROMBIN GENE MUTATION

## 2020-05-28 LAB — MAGNESIUM: Magnesium: 1.9 mg/dL (ref 1.7–2.4)

## 2020-05-28 MED ORDER — POLYETHYLENE GLYCOL 3350 17 GM/SCOOP PO POWD: 17.0000 g | Freq: Two times a day (BID) | ORAL | Status: DC | PRN

## 2020-05-28 NOTE — Discharge Summary (Signed)
Physician Discharge Summary  Sarah Evans:[id number] DOB: Jun 02, 1980 DOA: 05/26/2020  PCP: Storm Frisk, MD  Admit date: 05/26/2020 Discharge date: 05/28/2020  Admitted From: Home Disposition: Home  Recommendations for Outpatient Follow-up:  1. Follow up with PCP in 1 week  2. Outpatient follow-up with neurology 3. Follow up in ED if symptoms worsen or new appear   Home Health: No Equipment/Devices: None  Discharge Condition: Stable CODE STATUS: Full Diet recommendation: Heart healthy/carb modified  Brief/Interim Summary: 40 year old female with history of GERD, hyperlipidemia, diabetes mellitus type 2, schizoaffective disorder and recent ischemic stroke of the right frontal and occipital region and recent discharge on aspirin, Plavix and statin  presented with new onset numbness.  On presentation, CT of the head showed right frontal lobe recent infarction without evidence of hemorrhage or mass.  Teleneurology was consulted who recommended stroke work-up.  Subsequent MRI of the brain was negative for acute stroke.  Neurology documented that patient could be discharged to continue aspirin, Plavix for [redacted] weeks along with statin.  She has tolerated PT and OT and is tolerating diet.  She will be discharged home today.  Discharge Diagnoses:   Numbness and tingling: Improved History of recent ischemic stroke -Patient was recently admitted and discharged after recent ischemic stroke and was supposed to be on dual antiplatelet therapy with aspirin and Plavix for [redacted] weeks along with oral statin with outpatient follow-up with neurology -Presented with numbness and tingling.  Repeat MRI of the brain was negative for acute stroke and showed expected evolution of scattered right MCA and PCA infarcts over the past days along with stable petechial hemorrhage in the dominant right middle lobe gyrus infarct -MRI was reviewed by the neurologist on-call/Dr. Wilford Corner who documented that patient could be  discharged to continue aspirin, Plavix for [redacted] weeks along with statin.  Outpatient follow-up with neurology as scheduled.   -She has tolerated PT and OT and is tolerating diet.  She will be discharged home today.  Schizoaffective disorder/bipolar type -Continue home regimen  GERD without esophagitis -Continue Protonix  Diabetes mellitus type 2 -Continue metformin   Discharge Instructions  Discharge Instructions    Ambulatory referral to Neurology   Complete by: As directed    An appointment is requested in approximately: in 1-2 weeks   Diet - low sodium heart healthy   Complete by: As directed    Diet Carb Modified   Complete by: As directed    Increase activity slowly   Complete by: As directed      Allergies as of 05/28/2020   No Known Allergies     Medication List    TAKE these medications   Aspirin Low Dose 81 MG EC tablet Generic drug: aspirin Take 1 tablet (81 mg total) by mouth daily. Swallow whole.   atorvastatin 10 MG tablet Commonly known as: LIPITOR Take 1 tablet (10 mg total) by mouth daily.   busPIRone 15 MG tablet Commonly known as: BUSPAR Take 1 tablet (15 mg total) by mouth 3 (three) times daily.   clobetasol cream 0.05 % Commonly known as: TEMOVATE Apply 1 application topically 2 (two) times daily.   clopidogrel 75 MG tablet Commonly known as: PLAVIX Take 1 tablet (75 mg total) by mouth daily for 21 days.   cyclobenzaprine 10 MG tablet Commonly known as: FLEXERIL Take 10 mg by mouth daily as needed for muscle spasms.   metFORMIN 500 MG tablet Commonly known as: GLUCOPHAGE Take 1 tablet (500 mg total) by mouth  daily with breakfast.   multivitamin with minerals Tabs tablet Take 1 tablet by mouth daily.   norgestimate-ethinyl estradiol 0.25-35 MG-MCG tablet Commonly known as: Mili Take 1 tablet by mouth daily.   pantoprazole 20 MG tablet Commonly known as: Protonix Take 1 tablet (20 mg total) by mouth daily.   polyethylene glycol  powder 17 GM/SCOOP powder Commonly known as: GLYCOLAX/MIRALAX Take 17 g by mouth 2 (two) times daily as needed for mild constipation.   prazosin 1 MG capsule Commonly known as: MINIPRESS Take 1 capsule (1 mg total) by mouth at bedtime. What changed:   when to take this  reasons to take this   QUEtiapine 100 MG tablet Commonly known as: SEROQUEL Take 1 tablet (100 mg total) by mouth at bedtime.   QUEtiapine 25 MG tablet Commonly known as: SEROquel Take 1 tablet (25 mg total) by mouth 2 (two) times daily.   valACYclovir 1000 MG tablet Commonly known as: VALTREX Take 1 tablet (1,000 mg total) by mouth daily.        No Known Allergies  Consultations:  Teleneurology/neurology reviewed the MRI   Procedures/Studies: CT ANGIO HEAD NECK W WO CM  Result Date: 05/24/2020 CLINICAL DATA:  Stroke follow-up. EXAM: CT ANGIOGRAPHY HEAD AND NECK TECHNIQUE: Multidetector CT imaging of the head and neck was performed using the standard protocol during bolus administration of intravenous contrast. Multiplanar CT image reconstructions and MIPs were obtained to evaluate the vascular anatomy. Carotid stenosis measurements (when applicable) are obtained utilizing NASCET criteria, using the distal internal carotid diameter as the denominator. CONTRAST:  50mL OMNIPAQUE IOHEXOL 350 MG/ML SOLN COMPARISON:  CT and MRI head May 23, 2020. FINDINGS: CTA NECK FINDINGS Aortic arch: Great vessel origins are patent. Right carotid system: No evidence of dissection, stenosis (50% or greater) or occlusion. Left carotid system: No evidence of dissection, stenosis (50% or greater) or occlusion. Vertebral arteries: Left dominant. No evidence of dissection, stenosis (50% or greater) or occlusion. Skeleton: No evidence of acute abnormality on limited assessment. Other neck: No acute abnormality. Upper chest: Metallic foreign body in the right upper lobe. Otherwise, visualized lung apices are clear. Review of the MIP  images confirms the above findings CTA HEAD FINDINGS Anterior circulation: No evidence of large vessel occlusion or proximal hemodynamically significant stenosis. While evaluation is limited distally due to venous contamination, suspected high-grade stenosis versus occlusion of a small distal right anterior MCA branch vessel in the region of the right frontal infarct. Posterior circulation: No large vessel occlusion or proximal hemodynamically significant stenosis. Bilateral posterior communicating arteries. None evaluation of the distal PCAs due to venous contamination. Venous sinuses: As permitted by contrast timing, patent. Review of the MIP images confirms the above findings Final report was delayed due to power outage that occurred at the time of dictation. Final report was discussed with Dr. Roda Shutters at 10:58 PM. IMPRESSION: 1. No large vessel occlusion or evidence of proximal hemodynamically significant stenosis. 2. While evaluation is limited distally due to venous contamination, suspected high-grade stenosis versus occlusion of a small distal right anterior MCA branch vessel in the region of the right frontal infarct. Electronically Signed   By: Feliberto Harts MD   On: 05/24/2020 11:09   CT HEAD WO CONTRAST  Result Date: 05/23/2020 CLINICAL DATA:  40 year old female with dizziness and left hand weakness since yesterday. EXAM: CT HEAD WITHOUT CONTRAST TECHNIQUE: Contiguous axial images were obtained from the base of the skull through the vertex without intravenous contrast. COMPARISON:  None. FINDINGS: Brain:  Abnormal roughly 3 cm area of loss gray-white matter differentiation in the anterior right middle frontal gyrus near the frontal operculum (series 2, image 18 and series 5, image 22). Heterogeneous density there raising the possibility of petechial hemorrhage. No significant mass effect. Elsewhere gray-white matter differentiation appears to be normal. No midline shift, ventriculomegaly. No other acute  or chronic cortically based infarct. Vascular: No suspicious intracranial vascular hyperdensity. Skull: Negative. Sinuses/Orbits: Visualized paranasal sinuses and mastoids are clear. Tympanic cavities appear clear. Other: Visualized orbits and scalp soft tissues are within normal limits. IMPRESSION: Abnormal 3 cm area of mixed density in the right middle frontal gyrus. The noncontrast CT appearance most resembles a recent infarct with petechial hemorrhage. No significant mass effect. But other differential considerations include tumor and sequelae of a vascular malformation. Recommend Brain MRI without and with contrast to further characterize. Electronically Signed   By: Odessa Fleming M.D.   On: 05/23/2020 09:37   MR BRAIN WO CONTRAST  Result Date: 05/27/2020 CLINICAL DATA:  40 year old female code stroke presentation on 05/23/2020 with MRI revealing scattered restricted diffusion/infarcts in the right MCA and right PCA territories. EXAM: MRI HEAD WITHOUT CONTRAST TECHNIQUE: Multiplanar, multiecho pulse sequences of the brain and surrounding structures were obtained without intravenous contrast. COMPARISON:  Brain MRI without and with contrast 05/23/2020. CTA head and neck 05/24/2020. FINDINGS: Brain: Confluent roughly 3.5 cm area of restricted diffusion in the right middle frontal gyrus along the anterior operculum, as well as multiple scattered areas of cortical restricted diffusion in the right perirolandic cortex, right parietal lobe, and right occipital lobe are stable to mildly diminished since the MRI 4 days ago. No new diffusion restriction. Stable cytotoxic edema at the affected areas. Stable petechial hemorrhage in the dominant infarct. No significant intracranial mass effect. No new signal abnormality identified. No chronic encephalomalacia or other intracranial blood products identified. No midline shift, mass effect, evidence of mass lesion, ventriculomegaly, extra-axial collection. Cervicomedullary  junction and pituitary are within normal limits. Vascular: Major intracranial vascular flow voids are stable. Skull and upper cervical spine: Negative. Sinuses/Orbits: Negative. Other: Mastoids remain clear. Stable visible orbits soft tissues. Negative visible scalp and face. IMPRESSION: 1. Expected evolution of scattered right MCA and PCA infarcts over the past 4 days. Stable petechial hemorrhage in the dominant right middle frontal gyrus infarct. No significant mass effect. 2. No new intracranial abnormality. Electronically Signed   By: Odessa Fleming M.D.   On: 05/27/2020 08:47   MR Brain W and Wo Contrast  Result Date: 05/23/2020 CLINICAL DATA:  Abnormal head CT.  Left upper extremity weakness. EXAM: MRI HEAD WITHOUT AND WITH CONTRAST TECHNIQUE: Multiplanar, multiecho pulse sequences of the brain and surrounding structures were obtained without and with intravenous contrast. CONTRAST:  8.86mL GADAVIST GADOBUTROL 1 MMOL/ML IV SOLN COMPARISON:  None. FINDINGS: Brain: There is multifocal abnormal diffusion restriction within the right hemisphere, affecting the right middle and precentral gyri and the posterior right parietal lobe and right occipital lobe. Petechial blood products associated with the right frontal lesion. There is abnormal leptomeningeal contrast enhancement associated with the diffusion restricting lesions. Mild edema at the lesion sites but otherwise normal white matter signal. The midline structures are normal. Vascular: Major flow voids are preserved. Skull and upper cervical spine: Normal calvarium and skull base. Visualized upper cervical spine and soft tissues are normal. Sinuses/Orbits:No paranasal sinus fluid levels or advanced mucosal thickening. No mastoid or middle ear effusion. Normal orbits. IMPRESSION: 1. Multiple diffusion restricting cortical/subcortical lesions within the right  hemisphere with overlying leptomeningeal contrast enhancement, most consistent with subacute infarcts. One of  the lesions directly affects the hand region of the motor strip. 2. Petechial blood products associated with the right frontal lesion site. Heidelberg classification 1a: HI1, scattered small petechiae, no mass effect. Electronically Signed   By: Deatra Robinson M.D.   On: 05/23/2020 19:22   ECHO TEE  Result Date: 05/25/2020    TRANSESOPHOGEAL ECHO REPORT   Patient Name:   GRACEANNE GUIN Date of Exam: 05/25/2020 Medical Rec #:  [id number]     Height:       67.0 in Accession #:    [id number]    Weight:       186.9 lb Date of Birth:  [birth year]     BSA:          1.965 m Patient Age:    40 years      BP:           118/82 mmHg Patient Gender: F             HR:           61 bpm. Exam Location:  Inpatient Procedure: 2D Echo, Cardiac Doppler, Color Doppler and Saline Contrast Bubble            Study Indications:     Stroke  History:         Patient has no prior history of Echocardiogram examinations.  Sonographer:     Roosvelt Maser RDCS Referring Phys:  [id number] HAO MENG Diagnosing Phys: Zoila Shutter MD PROCEDURE: The transesophogeal probe was passed without difficulty through the esophogus of the patient. Local oropharyngeal anesthetic was provided with Cetacaine. Sedation performed by different physician. The patient was monitored while under deep sedation. The patient developed no complications during the procedure. IMPRESSIONS  1. Left ventricular ejection fraction, by estimation, is 60 to 65%. The left ventricle has normal function.  2. Right ventricular systolic function is normal. The right ventricular size is normal.  3. No left atrial/left atrial appendage thrombus was detected.  4. The mitral valve is grossly normal. Trivial mitral valve regurgitation.  5. The aortic valve is tricuspid. Aortic valve regurgitation is not visualized.  6. Evidence of atrial level shunting detected by color flow Doppler. Agitated saline contrast bubble study was positive with shunting observed after >6 cardiac cycles suggestive of  intrapulmonary shunting. Conclusion(s)/Recommendation(s): No LA/LAA thrombus identified. Negative bubble study for interatrial shunt. No intracardiac source of embolism detected on this on this transesophageal echocardiogram. FINDINGS  Left Ventricle: Left ventricular ejection fraction, by estimation, is 60 to 65%. The left ventricle has normal function. The left ventricular internal cavity size was normal in size. Right Ventricle: The right ventricular size is normal. No increase in right ventricular wall thickness. Right ventricular systolic function is normal. Left Atrium: Left atrial size was normal in size. Spontaneous echo contrast was present. No left atrial/left atrial appendage thrombus was detected. Right Atrium: Right atrial size was normal in size. Pericardium: There is no evidence of pericardial effusion. Mitral Valve: The mitral valve is grossly normal. Trivial mitral valve regurgitation. Tricuspid Valve: The tricuspid valve is grossly normal. Tricuspid valve regurgitation is trivial. Aortic Valve: The aortic valve is tricuspid. Aortic valve regurgitation is not visualized. Pulmonic Valve: The pulmonic valve was grossly normal. Pulmonic valve regurgitation is not visualized. Aorta: The aortic root and ascending aorta are structurally normal, with no evidence of dilitation. IAS/Shunts: Evidence of atrial level shunting detected by color flow Doppler.  Agitated saline contrast was given intravenously to evaluate for intracardiac shunting. Agitated saline contrast bubble study was positive with shunting observed after >6 cardiac cycles suggestive of intrapulmonary shunting. Zoila Shutter MD Electronically signed by Zoila Shutter MD Signature Date/Time: 05/25/2020/3:32:22 PM    Final    ECHOCARDIOGRAM COMPLETE BUBBLE STUDY  Result Date: 05/24/2020    ECHOCARDIOGRAM REPORT   Patient Name:   NIKISHA FLEECE Date of Exam: 05/24/2020 Medical Rec #:  [id number]     Height:       67.0 in Accession #:    [id number]     Weight:       187.0 lb Date of Birth:  11-Jun-1980     BSA:          1.965 m Patient Age:    40 years      BP:           121/79 mmHg Patient Gender: F             HR:           81 bpm. Exam Location:  Inpatient Procedure: 2D Echo, Color Doppler, Cardiac Doppler, 3D Echo and Saline Contrast            Bubble Study                               MODIFIED REPORT: This report was modified by Weston Brass MD on 05/24/2020 due to revision.  Indications:     Stroke 434.91 / I63.  History:         Patient has no prior history of Echocardiogram examinations.                  Risk Factors:Hypertension, Diabetes and Dyslipidemia.  Sonographer:     Leta Jungling RDCS Referring Phys:  [id number] Gordy Councilman Diagnosing Phys: Weston Brass MD IMPRESSIONS  1. Left ventricular ejection fraction, by estimation, is 60 to 65%. The left ventricle has normal function. The left ventricle has no regional wall motion abnormalities. Left ventricular diastolic parameters were normal. The average left ventricular global longitudinal strain is -21.0 %. The global longitudinal strain is normal.  2. Right ventricular systolic function is normal. The right ventricular size is normal. There is normal pulmonary artery systolic pressure. The estimated right ventricular systolic pressure is 21.3 mmHg.  3. The mitral valve is normal in structure. No evidence of mitral valve regurgitation. No evidence of mitral stenosis.  4. The aortic valve is tricuspid. Aortic valve regurgitation is not visualized. No aortic stenosis is present.  5. The inferior vena cava is normal in size with greater than 50% respiratory variability, suggesting right atrial pressure of 3 mmHg.  6. Agitated saline contrast bubble study was negative, with no evidence of any interatrial shunt. Conclusion(s)/Recommendation(s): No intracardiac source of embolism detected on this transthoracic study. A transesophageal echocardiogram is recommended to exclude cardiac source of  embolism if clinically indicated. FINDINGS  Left Ventricle: Left ventricular ejection fraction, by estimation, is 60 to 65%. The left ventricle has normal function. The left ventricle has no regional wall motion abnormalities. The average left ventricular global longitudinal strain is -21.0 %. The global longitudinal strain is normal. 3D left ventricular ejection fraction analysis performed but not reported based on interpreter judgement due to suboptimal quality. The left ventricular internal cavity size was normal in size. There is no left ventricular hypertrophy. Left ventricular diastolic parameters were normal. Right  Ventricle: The right ventricular size is normal. No increase in right ventricular wall thickness. Right ventricular systolic function is normal. There is normal pulmonary artery systolic pressure. The tricuspid regurgitant velocity is 2.14 m/s, and  with an assumed right atrial pressure of 3 mmHg, the estimated right ventricular systolic pressure is 21.3 mmHg. Left Atrium: Left atrial size was normal in size. Right Atrium: Right atrial size was normal in size. Pericardium: There is no evidence of pericardial effusion. Mitral Valve: The mitral valve is normal in structure. No evidence of mitral valve regurgitation. No evidence of mitral valve stenosis. Tricuspid Valve: The tricuspid valve is normal in structure. Tricuspid valve regurgitation is trivial. No evidence of tricuspid stenosis. Aortic Valve: The aortic valve is tricuspid. Aortic valve regurgitation is not visualized. No aortic stenosis is present. Pulmonic Valve: The pulmonic valve was normal in structure. Pulmonic valve regurgitation is not visualized. No evidence of pulmonic stenosis. Aorta: No hemodynamically significant coarctation of the aorta. The aortic root is normal in size and structure. Venous: The inferior vena cava is normal in size with greater than 50% respiratory variability, suggesting right atrial pressure of 3 mmHg.  IAS/Shunts: No atrial level shunt detected by color flow Doppler. Agitated saline contrast was given intravenously to evaluate for intracardiac shunting. Agitated saline contrast bubble study was negative, with no evidence of any interatrial shunt.  LEFT VENTRICLE PLAX 2D LVOT diam:     1.90 cm  Diastology LV SV:         58       LV e' medial:    9.03 cm/s LV SV Index:   30       LV E/e' medial:  6.0 LVOT Area:     2.84 cm LV e' lateral:   16.60 cm/s                         LV E/e' lateral: 3.3                          2D Longitudinal Strain                         2D Strain GLS Avg:     -21.0 %                          3D Volume EF                         LV 3D EF:    59.00 %                         LV 3D EDV:   97400.00 mm                         LV 3D ESV:   40000.00 mm                         LV 3D SV:    57400.00 mm RIGHT VENTRICLE RV S prime:     12.60 cm/s TAPSE (M-mode): 1.8 cm LEFT ATRIUM             Index       RIGHT ATRIUM           Index LA Vol (  A2C):   30.8 ml 15.67 ml/m RA Area:     11.30 cm LA Vol (A4C):   28.7 ml 14.60 ml/m RA Volume:   23.30 ml  11.86 ml/m LA Biplane Vol: 31.2 ml 15.88 ml/m  AORTIC VALVE LVOT Vmax:   129.00 cm/s LVOT Vmean:  84.600 cm/s LVOT VTI:    0.206 m  AORTA Ao Root diam: 3.00 cm Ao Asc diam:  2.70 cm MITRAL VALVE               TRICUSPID VALVE MV Area (PHT): 3.08 cm    TR Peak grad:   18.3 mmHg MV Decel Time: 246 msec    TR Vmax:        214.00 cm/s MV E velocity: 54.20 cm/s MV A velocity: 48.30 cm/s  SHUNTS MV E/A ratio:  1.12        Systemic VTI:  0.21 m                            Systemic Diam: 1.90 cm Weston Brass MD Electronically signed by Weston Brass MD Signature Date/Time: 05/24/2020/4:52:49 PM    Final (Updated)    CT HEAD CODE STROKE WO CONTRAST  Result Date: 05/27/2020 CLINICAL DATA:  Code stroke.  Numbness of the right hand and foot. EXAM: CT HEAD WITHOUT CONTRAST TECHNIQUE: Contiguous axial images were obtained from the base of the skull through  the vertex without intravenous contrast. COMPARISON:  None. FINDINGS: Brain: There is no mass, hemorrhage or extra-axial collection. The size and configuration of the ventricles and extra-axial CSF spaces are normal. There is a recent infarction within the right frontal lobe. No abnormality within the left hemisphere. Vascular: No abnormal hyperdensity of the major intracranial arteries or dural venous sinuses. No intracranial atherosclerosis. Skull: The visualized skull base, calvarium and extracranial soft tissues are normal. Sinuses/Orbits: No fluid levels or advanced mucosal thickening of the visualized paranasal sinuses. No mastoid or middle ear effusion. The orbits are normal. ASPECTS [facility] Stroke Program Early CT Score) -- left hemisphere - Ganglionic level infarction (caudate, lentiform nuclei, internal capsule, insula, M1-M3 cortex): 7 - Supraganglionic infarction (M4-M6 cortex): 3 Total score (0-10 with 10 being normal): 10 IMPRESSION: 1. Right frontal lobe recent infarction without hemorrhage or mass effect. 2. ASPECTS is 10. These results were called by telephone at the time of interpretation on 05/27/2020 at 12:47 am to provider [facility] , who verbally acknowledged these results. Electronically Signed   By: Deatra Robinson M.D.   On: 05/27/2020 00:47   VAS [place] LOWER EXTREMITY VENOUS (DVT)  Result Date: 05/24/2020  Lower Venous DVT Study Patient Name:  ZAVIA PULLEN  Date of Exam:   05/24/2020 Medical Rec #: [id number]      Accession #:    [id number] Date of Birth: April 17, 1980      Patient Gender: F Patient Age:   89Y Exam Location:  [facility] Procedure:      VAS [place] LOWER EXTREMITY VENOUS (DVT) Referring Phys: [id number] Marvel Plan --------------------------------------------------------------------------------  Indications: Stroke.  Comparison Study: no prior Performing Technologist: Blanch Media RVS  Examination Guidelines: A complete evaluation includes B-mode imaging, spectral Doppler,  color Doppler, and power Doppler as needed of all accessible portions of each vessel. Bilateral testing is considered an integral part of a complete examination. Limited examinations for reoccurring indications may be performed as noted. The reflux portion of the exam is performed with the patient in reverse Trendelenburg.  +---------+---------------+---------+-----------+----------+-------------------+  RIGHT    CompressibilityPhasicitySpontaneityPropertiesThrombus Aging      +---------+---------------+---------+-----------+----------+-------------------+ CFV      Full           Yes      Yes                                      +---------+---------------+---------+-----------+----------+-------------------+ SFJ      Full                                                             +---------+---------------+---------+-----------+----------+-------------------+ FV Prox  Full                                                             +---------+---------------+---------+-----------+----------+-------------------+ FV Mid   Full                                                             +---------+---------------+---------+-----------+----------+-------------------+ FV DistalFull                                                             +---------+---------------+---------+-----------+----------+-------------------+ PFV      Full                                                             +---------+---------------+---------+-----------+----------+-------------------+ POP      Full           Yes      Yes                                      +---------+---------------+---------+-----------+----------+-------------------+ PTV      Full                                                             +---------+---------------+---------+-----------+----------+-------------------+ PERO                                                  Not well visualized  +---------+---------------+---------+-----------+----------+-------------------+   +---------+---------------+---------+-----------+----------+--------------+ LEFT     CompressibilityPhasicitySpontaneityPropertiesThrombus Aging +---------+---------------+---------+-----------+----------+--------------+  CFV      Full           Yes      Yes                                 +---------+---------------+---------+-----------+----------+--------------+ SFJ      Full                                                        +---------+---------------+---------+-----------+----------+--------------+ FV Prox  Full                                                        +---------+---------------+---------+-----------+----------+--------------+ FV Mid   Full                                                        +---------+---------------+---------+-----------+----------+--------------+ FV DistalFull                                                        +---------+---------------+---------+-----------+----------+--------------+ PFV      Full                                                        +---------+---------------+---------+-----------+----------+--------------+ POP      Full           Yes      Yes                                 +---------+---------------+---------+-----------+----------+--------------+ PTV      Full                                                        +---------+---------------+---------+-----------+----------+--------------+ PERO     Full                                                        +---------+---------------+---------+-----------+----------+--------------+     Summary: BILATERAL: - No evidence of deep vein thrombosis seen in the lower extremities, bilaterally. -No evidence of popliteal cyst, bilaterally.   *See table(s) above for measurements and observations. Electronically signed by Lemar Livings MD on 05/24/2020 at  3:36:31 PM.    Final  Subjective: Patient seen and examined at bedside.  Complains of mild headache but thinks that she is ready to go home today.  Denies worsening numbness or weakness.  No overnight fever or vomiting reported  Discharge Exam: Vitals:   05/28/20 0008 05/28/20 0432  BP: (!) 108/57 100/70  Pulse: (!) 55 60  Resp: 16 16  Temp: 98.8 F (37.1 C) 98.1 F (36.7 C)  SpO2: 99% 100%    General: Pt is alert, awake, not in acute distress Cardiovascular: Mild intermittent bradycardia present, S1/S2 + Respiratory: bilateral decreased breath sounds at bases Abdominal: Soft, NT, ND, bowel sounds + Extremities: no edema, no cyanosis    The results of significant diagnostics from this hospitalization (including imaging, microbiology, ancillary and laboratory) are listed below for reference.     Microbiology: Recent Results (from the past 240 hour(s))  Resp Panel by RT-PCR (Flu A&B, Covid) Nasopharyngeal Swab     Status: None   Collection Time: 05/23/20 11:37 PM   Specimen: Nasopharyngeal Swab; Nasopharyngeal(NP) swabs in vial transport medium  Result Value Ref Range Status   SARS Coronavirus 2 by RT PCR NEGATIVE NEGATIVE Final    Comment: (NOTE) SARS-CoV-2 target nucleic acids are NOT DETECTED.  The SARS-CoV-2 RNA is generally detectable in upper respiratory specimens during the acute phase of infection. The lowest concentration of SARS-CoV-2 viral copies this assay can detect is 138 copies/mL. A negative result does not preclude SARS-Cov-2 infection and should not be used as the sole basis for treatment or other patient management decisions. A negative result may occur with  improper specimen collection/handling, submission of specimen other than nasopharyngeal swab, presence of viral mutation(s) within the areas targeted by this assay, and inadequate number of viral copies(<138 copies/mL). A negative result must be combined with clinical observations,  patient history, and epidemiological information. The expected result is Negative.  Fact Sheet for Patients:  BloggerCourse.com  Fact Sheet for Healthcare Providers:  SeriousBroker.it  This test is no t yet approved or cleared by the [place] FDA and  has been authorized for detection and/or diagnosis of SARS-CoV-2 by FDA under an Emergency Use Authorization (EUA). This EUA will remain  in effect (meaning this test can be used) for the duration of the COVID-19 declaration under Section 564(b)(1) of the Act, 21 U.S.C.section 360bbb-3(b)(1), unless the authorization is terminated  or revoked sooner.       Influenza A by PCR NEGATIVE NEGATIVE Final   Influenza B by PCR NEGATIVE NEGATIVE Final    Comment: (NOTE) The Xpert Xpress SARS-CoV-2/FLU/RSV plus assay is intended as an aid in the diagnosis of influenza from Nasopharyngeal swab specimens and should not be used as a sole basis for treatment. Nasal washings and aspirates are unacceptable for Xpert Xpress SARS-CoV-2/FLU/RSV testing.  Fact Sheet for Patients: BloggerCourse.com  Fact Sheet for Healthcare Providers: SeriousBroker.it  This test is not yet approved or cleared by the [place] FDA and has been authorized for detection and/or diagnosis of SARS-CoV-2 by FDA under an Emergency Use Authorization (EUA). This EUA will remain in effect (meaning this test can be used) for the duration of the COVID-19 declaration under Section 564(b)(1) of the Act, 21 U.S.C. section 360bbb-3(b)(1), unless the authorization is terminated or revoked.  Performed at [facility], [address] [place], [place] [address]   CSF culture     Status: None   Collection Time: 05/24/20  4:02 PM   Specimen: CSF; Cerebrospinal Fluid  Result Value Ref Range Status  Specimen Description CSF  Final   Special Requests NONE   Final   Gram Stain   Final    WBC PRESENT, PREDOMINANTLY MONONUCLEAR NO ORGANISMS SEEN CYTOSPIN SMEAR    Culture   Final    NO GROWTH 3 DAYS Performed at [facility], [address], [place], [place] [address]    Report Status 05/28/2020 FINAL  Final  Resp Panel by RT-PCR (Flu A&B, Covid) Nasopharyngeal Swab     Status: None   Collection Time: 05/27/20  1:20 AM   Specimen: Nasopharyngeal Swab; Nasopharyngeal(NP) swabs in vial transport medium  Result Value Ref Range Status   SARS Coronavirus 2 by RT PCR NEGATIVE NEGATIVE Final    Comment: (NOTE) SARS-CoV-2 target nucleic acids are NOT DETECTED.  The SARS-CoV-2 RNA is generally detectable in upper respiratory specimens during the acute phase of infection. The lowest concentration of SARS-CoV-2 viral copies this assay can detect is 138 copies/mL. A negative result does not preclude SARS-Cov-2 infection and should not be used as the sole basis for treatment or other patient management decisions. A negative result may occur with  improper specimen collection/handling, submission of specimen other than nasopharyngeal swab, presence of viral mutation(s) within the areas targeted by this assay, and inadequate number of viral copies(<138 copies/mL). A negative result must be combined with clinical observations, patient history, and epidemiological information. The expected result is Negative.  Fact Sheet for Patients:  BloggerCourse.com  Fact Sheet for Healthcare Providers:  SeriousBroker.it  This test is no t yet approved or cleared by the [place] FDA and  has been authorized for detection and/or diagnosis of SARS-CoV-2 by FDA under an Emergency Use Authorization (EUA). This EUA will remain  in effect (meaning this test can be used) for the duration of the COVID-19 declaration under Section 564(b)(1) of the Act, 21 U.S.C.section 360bbb-3(b)(1), unless the authorization  is terminated  or revoked sooner.       Influenza A by PCR NEGATIVE NEGATIVE Final   Influenza B by PCR NEGATIVE NEGATIVE Final    Comment: (NOTE) The Xpert Xpress SARS-CoV-2/FLU/RSV plus assay is intended as an aid in the diagnosis of influenza from Nasopharyngeal swab specimens and should not be used as a sole basis for treatment. Nasal washings and aspirates are unacceptable for Xpert Xpress SARS-CoV-2/FLU/RSV testing.  Fact Sheet for Patients: BloggerCourse.com  Fact Sheet for Healthcare Providers: SeriousBroker.it  This test is not yet approved or cleared by the [place] FDA and has been authorized for detection and/or diagnosis of SARS-CoV-2 by FDA under an Emergency Use Authorization (EUA). This EUA will remain in effect (meaning this test can be used) for the duration of the COVID-19 declaration under Section 564(b)(1) of the Act, 21 U.S.C. section 360bbb-3(b)(1), unless the authorization is terminated or revoked.  Performed at [facility], [address] [place], [place] [address]      Labs: BNP (last 3 results) No results for input(s): BNP in the last 8760 hours. Basic Metabolic Panel: Recent Labs  Lab 05/23/20 0833 05/24/20 0332 05/27/20 0026 05/27/20 0050 05/28/20 0458  NA 140 137 139 140 138  K 3.7 3.9 3.0* 3.4* 3.8  CL 108 109 108 108 108  CO2 22 22 22   --  21*  GLUCOSE 93 94 107* 101* 92  BUN 13 10 13 15 13   CREATININE 0.86 0.99 1.05* 1.00 1.04*  CALCIUM 9.2 8.5* 9.3  --  8.8*  MG  --   --   --   --  1.9   Liver Function Tests: Recent Labs  Lab 05/23/20 0833 05/27/20 0026  AST 21 22  ALT 35 33  ALKPHOS 65 75  BILITOT 0.6 0.5  PROT 7.1 7.9  ALBUMIN 3.6 4.0   No results for input(s): LIPASE, AMYLASE in the last 168 hours. No results for input(s): AMMONIA in the last 168 hours. CBC: Recent Labs  Lab 05/23/20 0833 05/24/20 0332 05/27/20 0026 05/27/20 0050  05/28/20 0458  WBC 7.8 6.1 7.4  --  5.6  NEUTROABS 5.3  --  3.2  --  2.6  HGB 13.5 12.9 13.5 13.9 12.9  HCT 41.9 40.2 42.7 41.0 41.1  MCV 86.6 86.3 85.6  --  87.1  PLT 332 313 334  --  321   Cardiac Enzymes: No results for input(s): CKTOTAL, CKMB, CKMBINDEX, TROPONINI in the last 168 hours. BNP: Invalid input(s): POCBNP CBG: Recent Labs  Lab 05/25/20 0751 05/25/20 1151 05/25/20 1614 05/27/20 0025 05/28/20 0743  GLUCAP 87 170* 102* 99 108*   D-Dimer No results for input(s): DDIMER in the last 72 hours. Hgb A1c No results for input(s): HGBA1C in the last 72 hours. Lipid Profile No results for input(s): CHOL, HDL, LDLCALC, TRIG, CHOLHDL, LDLDIRECT in the last 72 hours. Thyroid function studies No results for input(s): TSH, T4TOTAL, T3FREE, THYROIDAB in the last 72 hours.  Invalid input(s): FREET3 Anemia work up No results for input(s): VITAMINB12, FOLATE, FERRITIN, TIBC, IRON, RETICCTPCT in the last 72 hours. Urinalysis    Component Value Date/Time   COLORURINE RED (A) 05/27/2020 0102   APPEARANCEUR CLOUDY (A) 05/27/2020 0102   LABSPEC 1.039 (H) 05/27/2020 0102   PHURINE 5.0 05/27/2020 0102   GLUCOSEU NEGATIVE 05/27/2020 0102   HGBUR LARGE (A) 05/27/2020 0102   BILIRUBINUR NEGATIVE 05/27/2020 0102   KETONESUR 5 (A) 05/27/2020 0102   PROTEINUR 100 (A) 05/27/2020 0102   NITRITE NEGATIVE 05/27/2020 0102   LEUKOCYTESUR NEGATIVE 05/27/2020 0102   Sepsis Labs Invalid input(s): PROCALCITONIN,  WBC,  LACTICIDVEN Microbiology Recent Results (from the past 240 hour(s))  Resp Panel by RT-PCR (Flu A&B, Covid) Nasopharyngeal Swab     Status: None   Collection Time: 05/23/20 11:37 PM   Specimen: Nasopharyngeal Swab; Nasopharyngeal(NP) swabs in vial transport medium  Result Value Ref Range Status   SARS Coronavirus 2 by RT PCR NEGATIVE NEGATIVE Final    Comment: (NOTE) SARS-CoV-2 target nucleic acids are NOT DETECTED.  The SARS-CoV-2 RNA is generally detectable in upper  respiratory specimens during the acute phase of infection. The lowest concentration of SARS-CoV-2 viral copies this assay can detect is 138 copies/mL. A negative result does not preclude SARS-Cov-2 infection and should not be used as the sole basis for treatment or other patient management decisions. A negative result may occur with  improper specimen collection/handling, submission of specimen other than nasopharyngeal swab, presence of viral mutation(s) within the areas targeted by this assay, and inadequate number of viral copies(<138 copies/mL). A negative result must be combined with clinical observations, patient history, and epidemiological information. The expected result is Negative.  Fact Sheet for Patients:  BloggerCourse.com  Fact Sheet for Healthcare Providers:  SeriousBroker.it  This test is no t yet approved or cleared by the [place] FDA and  has been authorized for detection and/or diagnosis of SARS-CoV-2 by FDA under an Emergency Use Authorization (EUA). This EUA will remain  in effect (meaning this test can be used) for the duration of the COVID-19 declaration under Section 564(b)(1) of the Act, 21 U.S.C.section  360bbb-3(b)(1), unless the authorization is terminated  or revoked sooner.       Influenza A by PCR NEGATIVE NEGATIVE Final   Influenza B by PCR NEGATIVE NEGATIVE Final    Comment: (NOTE) The Xpert Xpress SARS-CoV-2/FLU/RSV plus assay is intended as an aid in the diagnosis of influenza from Nasopharyngeal swab specimens and should not be used as a sole basis for treatment. Nasal washings and aspirates are unacceptable for Xpert Xpress SARS-CoV-2/FLU/RSV testing.  Fact Sheet for Patients: BloggerCourse.comhttps://www.fda.gov/media/152166/download  Fact Sheet for Healthcare Providers: SeriousBroker.ithttps://www.fda.gov/media/152162/download  This test is not yet approved or cleared by the [place] FDA and has been  authorized for detection and/or diagnosis of SARS-CoV-2 by FDA under an Emergency Use Authorization (EUA). This EUA will remain in effect (meaning this test can be used) for the duration of the COVID-19 declaration under Section 564(b)(1) of the Act, 21 U.S.C. section 360bbb-3(b)(1), unless the authorization is terminated or revoked.  Performed at [facility], [address] [place], [place] [address]   CSF culture     Status: None   Collection Time: 05/24/20  4:02 PM   Specimen: CSF; Cerebrospinal Fluid  Result Value Ref Range Status   Specimen Description CSF  Final   Special Requests NONE  Final   Gram Stain   Final    WBC PRESENT, PREDOMINANTLY MONONUCLEAR NO ORGANISMS SEEN CYTOSPIN SMEAR    Culture   Final    NO GROWTH 3 DAYS Performed at [facility], [address] [place], [place] [address]    Report Status 05/28/2020 FINAL  Final  Resp Panel by RT-PCR (Flu A&B, Covid) Nasopharyngeal Swab     Status: None   Collection Time: 05/27/20  1:20 AM   Specimen: Nasopharyngeal Swab; Nasopharyngeal(NP) swabs in vial transport medium  Result Value Ref Range Status   SARS Coronavirus 2 by RT PCR NEGATIVE NEGATIVE Final    Comment: (NOTE) SARS-CoV-2 target nucleic acids are NOT DETECTED.  The SARS-CoV-2 RNA is generally detectable in upper respiratory specimens during the acute phase of infection. The lowest concentration of SARS-CoV-2 viral copies this assay can detect is 138 copies/mL. A negative result does not preclude SARS-Cov-2 infection and should not be used as the sole basis for treatment or other patient management decisions. A negative result may occur with  improper specimen collection/handling, submission of specimen other than nasopharyngeal swab, presence of viral mutation(s) within the areas targeted by this assay, and inadequate number of viral copies(<138 copies/mL). A negative result must be combined with clinical observations,  patient history, and epidemiological information. The expected result is Negative.  Fact Sheet for Patients:  BloggerCourse.comhttps://www.fda.gov/media/152166/download  Fact Sheet for Healthcare Providers:  SeriousBroker.ithttps://www.fda.gov/media/152162/download  This test is no t yet approved or cleared by the [place] FDA and  has been authorized for detection and/or diagnosis of SARS-CoV-2 by FDA under an Emergency Use Authorization (EUA). This EUA will remain  in effect (meaning this test can be used) for the duration of the COVID-19 declaration under Section 564(b)(1) of the Act, 21 U.S.C.section 360bbb-3(b)(1), unless the authorization is terminated  or revoked sooner.       Influenza A by PCR NEGATIVE NEGATIVE Final   Influenza B by PCR NEGATIVE NEGATIVE Final    Comment: (NOTE) The Xpert Xpress SARS-CoV-2/FLU/RSV plus assay is intended as an aid in the diagnosis of influenza from Nasopharyngeal swab specimens and should not be used as a sole basis for treatment. Nasal washings and aspirates are unacceptable for  Xpert Xpress SARS-CoV-2/FLU/RSV testing.  Fact Sheet for Patients: BloggerCourse.com  Fact Sheet for Healthcare Providers: SeriousBroker.it  This test is not yet approved or cleared by the [place] FDA and has been authorized for detection and/or diagnosis of SARS-CoV-2 by FDA under an Emergency Use Authorization (EUA). This EUA will remain in effect (meaning this test can be used) for the duration of the COVID-19 declaration under Section 564(b)(1) of the Act, 21 U.S.C. section 360bbb-3(b)(1), unless the authorization is terminated or revoked.  Performed at [facility], [address], [place], [place] [address]      Time coordinating discharge: 35 minutes  SIGNED:   Glade Lloyd, MD  [facility] 05/28/2020, 10:27 AM

## 2020-05-28 NOTE — Progress Notes (Signed)
Patient discharging home.  IV removed - WNL.  Reviewed AVS including medications and follow up appointments.  Patient verbalizes understanding.  All questions answered satisfactorily.  Patient in NAD at this time.

## 2020-05-28 NOTE — Progress Notes (Signed)
SLP Cancellation Note  Patient Details Name: Sarah Evans MRN: [id number] DOB: [birth year]   Cancelled treatment:       Reason Eval/Treat Not Completed: SLP screened, no needs identified, will sign off  Angela Nevin, MA, CCC-SLP Speech Therapy

## 2020-05-28 NOTE — Progress Notes (Signed)
Pt alert and aware in bed. Pt's daughter was at bedside. Pt is happy she will be going home today.  The chaplain offered caring and supportive presence, he sang a Engineer, structural, and offered prayers and blessings.

## 2020-05-28 NOTE — Telephone Encounter (Signed)
Transition Care Management Unsuccessful Follow-up Telephone Call  Date of discharge and from where:   05/25/2020, [facility].  She was then readmitted 5/22-5/23/2022 [facility]  Attempts:  1st Attempt  Reason for unsuccessful TCM follow-up call:  Left voice message on # [phone] and [phone].  Need to discuss scheduling a hospital follow up appointment

## 2020-05-28 NOTE — TOC Transition Note (Signed)
Transition of Care [facility]) - CM/SW Discharge Note   Patient Details  Name: Sarah Evans MRN: [id number] Date of Birth: Sep 17, 1980  [facility] CM/SW Contact:  Lanier Clam, RN Phone Number: 05/28/2020, 12:20 PM   Clinical Narrative: Went into rm to discuss Health insurance-[employer] resource-patient has already left for d/c. No further CM needs            Patient Goals and CMS Choice        Discharge Placement                       Discharge Plan and Services                                     Social Determinants of Health (SDOH) Interventions     Readmission Risk Interventions No flowsheet data found.

## 2020-05-29 ENCOUNTER — Telehealth: Payer: Self-pay

## 2020-05-29 ENCOUNTER — Telehealth: Payer: Self-pay | Admitting: Critical Care Medicine

## 2020-05-29 NOTE — Telephone Encounter (Signed)
Patient would like the doctor or nurse to call her regarding one of her medications, spironolactone, which she was told to stop taking when she went to the hospital.  Patient feels this medication was helping her and would like to know why they want her to stop.  Please advise and call patient before 11 today.  If not, please leave a VM.  CB# [phone]

## 2020-05-29 NOTE — Telephone Encounter (Signed)
Patient is calling to schedule a financial counseling appt.  She stated she missed her other appt. Because she was in the hospital.  Please call to schedule at [phone]

## 2020-05-29 NOTE — Telephone Encounter (Signed)
Please advise 

## 2020-05-29 NOTE — Telephone Encounter (Signed)
Transition Care Management Unsuccessful Follow-up Telephone Call  Date of discharge and from where:   [facility]  Attempts:  2nd Attempt  Reason for unsuccessful TCM follow-up call: Unable to reach pt ,  left voice message on # [phone]  Need to discuss scheduling a hospital follow up appointment

## 2020-05-29 NOTE — Telephone Encounter (Signed)
Patient returned phone call, she has been rescheduled.

## 2020-05-30 ENCOUNTER — Telehealth: Payer: Self-pay

## 2020-05-30 LAB — FACTOR 5 LEIDEN

## 2020-05-30 NOTE — Telephone Encounter (Signed)
Contacted pt to go over provider response pt didn't answer left a detailed vm.   Dr. Delford Field pt is scheduled to see you July would you like to see pt earlier

## 2020-05-30 NOTE — Telephone Encounter (Signed)
Yes

## 2020-05-30 NOTE — Telephone Encounter (Signed)
Galin could you please contact pt and schedule a face to face with Dr. Delford Field for July

## 2020-05-30 NOTE — Telephone Encounter (Signed)
Need to hold this med until seen in office with labs.

## 2020-05-30 NOTE — Telephone Encounter (Signed)
Will forward to provider  

## 2020-05-30 NOTE — Telephone Encounter (Signed)
Transition Care Management Follow-up Telephone Call  Date of discharge and from where: 05/28/2020, [facility]   How have you been since you were released from the hospital? She said she is doing okay  Any questions or concerns? Yes - she wants to know if she should continue to take her birth control.  She also inquired why she is to stop taking the spironolactone. Informed her that Dr Delford Field wants her to hold this medication until she is seen in the office and has labs done.  - she said that she understood. Her appointment with Dr Delford Field is not until 07/10/2020 and she wishes to be seen sooner.  Scheduled her at [facility] with Angus Seller, NP 06/07/2020.   Items Reviewed:  Did the pt receive and understand the discharge instructions provided? Yes   Medications obtained and verified? Yes   Other? No   Any new allergies since your discharge? No   Do you have support at home? Yes  -lives alone but can get help from her mother  Home Care and Equipment/Supplies: Were home health services ordered? no If so, what is the name of the agency? n/a  Has the agency set up a time to come to the patient's home? not applicable Were any new equipment or medical supplies ordered?  No What is the name of the medical supply agency? n/a Were you able to get the supplies/equipment? not applicable Do you have any questions related to the use of the equipment or supplies? No  Functional Questionnaire: (I = Independent and D = Dependent) ADLs: Independent   Follow up appointments reviewed:   PCP Hospital f/u appt confirmed? Yes  Angus Seller, NP - 06/07/2020.    Specialist Hospital f/u appt confirmed? No , none scheduled at this time.  Are transportation arrangements needed? No   If their condition worsens, is the pt aware to call PCP or go to the Emergency Dept.? Yes  Was the patient provided with contact information for the PCP's office or ED? Yes  Also provided her with the address and  phone number for [facility].   Was to pt encouraged to call back with questions or concerns? Yes

## 2020-06-01 NOTE — Telephone Encounter (Signed)
Patient is scheduled to see Dr. Delford Field July 5th

## 2020-06-06 ENCOUNTER — Other Ambulatory Visit: Payer: Self-pay

## 2020-06-07 ENCOUNTER — Ambulatory Visit: Payer: Medicaid Other

## 2020-06-08 ENCOUNTER — Ambulatory Visit: Payer: Medicaid Other

## 2020-06-15 ENCOUNTER — Telehealth: Payer: Self-pay | Admitting: Critical Care Medicine

## 2020-06-15 ENCOUNTER — Emergency Department (HOSPITAL_COMMUNITY): Payer: Self-pay

## 2020-06-15 ENCOUNTER — Encounter (HOSPITAL_COMMUNITY): Payer: Self-pay

## 2020-06-15 ENCOUNTER — Ambulatory Visit: Payer: Self-pay | Admitting: *Deleted

## 2020-06-15 ENCOUNTER — Other Ambulatory Visit: Payer: Self-pay

## 2020-06-15 ENCOUNTER — Emergency Department (HOSPITAL_COMMUNITY)
Admission: EM | Admit: 2020-06-15 | Discharge: 2020-06-15 | Disposition: A | Discharge status: Home / Self Care | Payer: Self-pay | Attending: Emergency Medicine | Admitting: Emergency Medicine

## 2020-06-15 DIAGNOSIS — Z7982 Long term (current) use of aspirin: Secondary | ICD-10-CM | POA: Insufficient documentation

## 2020-06-15 DIAGNOSIS — Z87891 Personal history of nicotine dependence: Secondary | ICD-10-CM | POA: Insufficient documentation

## 2020-06-15 DIAGNOSIS — R202 Paresthesia of skin: Secondary | ICD-10-CM | POA: Insufficient documentation

## 2020-06-15 DIAGNOSIS — R7303 Prediabetes: Secondary | ICD-10-CM | POA: Insufficient documentation

## 2020-06-15 DIAGNOSIS — Z7902 Long term (current) use of antithrombotics/antiplatelets: Secondary | ICD-10-CM | POA: Insufficient documentation

## 2020-06-15 DIAGNOSIS — Z7984 Long term (current) use of oral hypoglycemic drugs: Secondary | ICD-10-CM | POA: Insufficient documentation

## 2020-06-15 HISTORY — DX: Cerebral infarction, unspecified: I63.9

## 2020-06-15 LAB — BASIC METABOLIC PANEL
Anion gap: 10 (ref 5–15)
BUN: 10 mg/dL (ref 6–20)
CO2: 22 mmol/L (ref 22–32)
Calcium: 9.1 mg/dL (ref 8.9–10.3)
Chloride: 107 mmol/L (ref 98–111)
Creatinine, Ser: 0.97 mg/dL (ref 0.44–1.00)
GFR, Estimated: >60 mL/min (ref >60)
Glucose, Bld: 91 mg/dL (ref 70–99)
Potassium: 3.5 mmol/L (ref 3.5–5.1)
Sodium: 139 mmol/L (ref 135–145)

## 2020-06-15 LAB — URINALYSIS, ROUTINE W REFLEX MICROSCOPIC
Bilirubin Urine: NEGATIVE
Glucose, UA: NEGATIVE mg/dL
Hgb urine dipstick: NEGATIVE
Ketones, ur: 5 mg/dL — AB
Nitrite: NEGATIVE
Protein, ur: NEGATIVE mg/dL
Specific Gravity, Urine: 1.025 (ref 1.005–1.030)
pH: 5 (ref 5.0–8.0)

## 2020-06-15 LAB — CBC WITH DIFFERENTIAL/PLATELET
Abs Immature Granulocytes: 0.02 10*3/uL (ref 0.00–0.07)
Basophils Absolute: 0.1 10*3/uL (ref 0.0–0.1)
Basophils Relative: 1 %
Eosinophils Absolute: 0.1 10*3/uL (ref 0.0–0.5)
Eosinophils Relative: 2 %
HCT: 39.8 % (ref 36.0–46.0)
Hemoglobin: 13.1 g/dL (ref 12.0–15.0)
Immature Granulocytes: 0 %
Lymphocytes Relative: 35 %
Lymphs Abs: 2.3 10*3/uL (ref 0.7–4.0)
MCH: 28.3 pg (ref 26.0–34.0)
MCHC: 32.9 g/dL (ref 30.0–36.0)
MCV: 86 fL (ref 80.0–100.0)
Monocytes Absolute: 0.8 10*3/uL (ref 0.1–1.0)
Monocytes Relative: 12 %
Neutro Abs: 3.3 10*3/uL (ref 1.7–7.7)
Neutrophils Relative %: 50 %
Platelets: 316 10*3/uL (ref 150–400)
RBC: 4.63 MIL/uL (ref 3.87–5.11)
RDW: 14.4 % (ref 11.5–15.5)
WBC: 6.5 10*3/uL (ref 4.0–10.5)
nRBC: 0 % (ref 0.0–0.2)

## 2020-06-15 LAB — I-STAT BETA HCG BLOOD, ED (MC, WL, AP ONLY): I-stat hCG, quantitative: <5 m[IU]/mL (ref <5)

## 2020-06-15 NOTE — Telephone Encounter (Signed)
Pt is calling back and would like to know about spironolactone medication that was on her discharge paperwork to stop. Pt states this medication helps with her PCOS

## 2020-06-15 NOTE — Telephone Encounter (Signed)
Will forward to provider  

## 2020-06-15 NOTE — Telephone Encounter (Signed)
Spironolactone was discontined by her GYN Dr Germaine Pomfret end of March this year  She needs to call that office to inquire

## 2020-06-15 NOTE — Telephone Encounter (Signed)
Patient reports she has stroke recently and since yesterday- she has been having tingling on her R side- face,arm, leg. Patient states she has had this happen once since her stroke and she was seen at ED and observed overnight- she did have referral to neurology- but could not go due to no insurance. Patient does have follow up appointment in the office on Monday. Advised per protocol UC/ED for evaluation of symptoms- since reoccurring and not going away. Patient states she will go and get checked today. Patient states she has PCOS and was advised to stop her Spirolactone. She wants to discuss this and also her concerns of elevated A1c readings. Advised patient she can do that at appointment Monday- but will send message to PCP regarding concerns- please address current symptoms- she states she will.

## 2020-06-15 NOTE — Telephone Encounter (Signed)
FYI

## 2020-06-15 NOTE — Telephone Encounter (Signed)
Reason for Disposition  [1] Tingling (e.g., pins and needles) of the face, arm / hand, or leg / foot on one side of the body AND [2] present now (Exceptions: chronic/recurrent symptom lasting > 4 weeks or tingling from known cause, such as: bumped elbow, carpal tunnel syndrome, pinched nerve, frostbite)  Answer Assessment - Initial Assessment Questions 1. SYMPTOM: "What is the main symptom you are concerned about?" (e.g., weakness, numbness)     Tingling in leg, arm,face-R 2. ONSET: "When did this start?" (minutes, hours, days; while sleeping)     yesterday 3. LAST NORMAL: "When was the last time you (the patient) were normal (no symptoms)?"     Wednesday- no symptoms 4. PATTERN "Does this come and go, or has it been constant since it started?"  "Is it present now?"     Constant- present now 5. CARDIAC SYMPTOMS: "Have you had any of the following symptoms: chest pain, difficulty breathing, palpitations?"     no 6. NEUROLOGIC SYMPTOMS: "Have you had any of the following symptoms: headache, dizziness, vision loss, double vision, changes in speech, unsteady on your feet?"     no 7. OTHER SYMPTOMS: "Do you have any other symptoms?"     no 8. PREGNANCY: "Is there any chance you are pregnant?" "When was your last menstrual period?"     No- not active  Protocols used: Neurologic Deficit-A-AH

## 2020-06-15 NOTE — Telephone Encounter (Addendum)
Pt is calling her a1c on 04-02-2020  was 6.0 and on may 18 her Alc went to 6.1. Pt is calling to see if md would like to increase her metformin or prescribe a different medication to lower her A1c . Pt also would like to know if she should be checking her A1C at home.  [facility] pharm

## 2020-06-15 NOTE — Telephone Encounter (Signed)
Returned pt call and went over response pt is aware and doesn't have any questions or concerns

## 2020-06-15 NOTE — ED Provider Notes (Signed)
[facility]-EMERGENCY DEPT Provider Note   CSN: [id number] Arrival date & time: 06/15/20  1800     History No chief complaint on file.   Sarah Evans is a 40 y.o. female.  HPI Patient reports she developed a tingling sensation on the right side of her body starting last night between 5 or 10 PM.  She reports it involves her face as well as her upper and lower extremity.  No associated weakness or numbness.  She reports she is able to ambulate at baseline.  No visual changes and no headache.    Past Medical History:  Diagnosis Date   Bipolar 1 disorder (HCC)    Herpes simplex virus (HSV) infection    dx 2001   Polycystic ovary disease    Schizophrenia (HCC)    Stroke [facility])     Patient Active Problem List   Diagnosis Date Noted   GERD without esophagitis 05/27/2020   History of ischemic stroke 05/27/2020   Numbness and tingling 05/24/2020   Ischemic stroke (HCC) 05/23/2020   BMI 29.0-29.9,adult 05/21/2020   HSV (herpes simplex virus) anogenital infection 05/21/2020   Prediabetes 04/12/2020   Intrinsic eczema 04/12/2020   Mixed hyperlipidemia 04/12/2020   Muscle spasticity 04/12/2020   Moderate episode of recurrent major depressive disorder (HCC) 06/09/2019   Generalized anxiety disorder 06/09/2019   Bipolar disorder, current episode depressed, moderate (HCC) 04/28/2018   Personal history of nonsuicidal self-harm 04/28/2018   PTSD (post-traumatic stress disorder) 04/30/2015   Schizoaffective disorder, bipolar type (HCC) 04/27/2015    Past Surgical History:  Procedure Laterality Date   BUBBLE STUDY  05/25/2020   Procedure: BUBBLE STUDY;  Surgeon: Chrystie Nose, MD;  Location: [facility] ENDOSCOPY;  Service: Cardiovascular;;   NO PAST SURGERIES     TEE WITHOUT CARDIOVERSION N/A 05/25/2020   Procedure: TRANSESOPHAGEAL ECHOCARDIOGRAM (TEE);  Surgeon: Chrystie Nose, MD;  Location: [facility] ENDOSCOPY;  Service: Cardiovascular;  Laterality: N/A;     OB  History     Gravida  1   Para  1   Term  0   Preterm  1   AB  0   Living  0      SAB  0   IAB  0   Ectopic  0   Multiple  0   Live Births  1           Family History  Problem Relation Age of Onset   Bipolar disorder Mother    Diabetes Maternal Grandmother    Hypertension Neg Hx    Heart disease Neg Hx    Cancer Neg Hx     Social History   Tobacco Use   Smoking status: Former    Packs/day: 0.25    Years: 15.00    Pack years: 3.75    Types: Cigarettes    Quit date: 05/02/2020    Years since quitting: 0.1   Smokeless tobacco: Never  Vaping Use   Vaping Use: Never used  Substance Use Topics   Alcohol use: No    Comment: occ   Drug use: No    Home Medications Prior to Admission medications   Medication Sig Start Date End Date Taking? Authorizing Provider  aspirin 81 MG EC tablet Take 1 tablet (81 mg total) by mouth daily. Swallow whole. 05/26/20 06/25/20 Yes Jerald Kief, MD  atorvastatin (LIPITOR) 10 MG tablet Take 1 tablet (10 mg total) by mouth daily. 04/12/20  Yes Storm Frisk, MD  busPIRone (  BUSPAR) 15 MG tablet Take 1 tablet (15 mg total) by mouth 3 (three) times daily. 12/06/19  Yes Toy Cookey E, NP  cholecalciferol (VITAMIN D3) 25 MCG (1000 UNIT) tablet Take 2,000 Units by mouth daily.   Yes [provider]  clopidogrel (PLAVIX) 75 MG tablet Take 1 tablet (75 mg total) by mouth daily for 21 days. 05/26/20 06/16/20 Yes Jerald Kief, MD  cyclobenzaprine (FLEXERIL) 10 MG tablet Take 10 mg by mouth daily as needed for muscle spasms.   Yes [provider]  metFORMIN (GLUCOPHAGE) 500 MG tablet Take 1 tablet (500 mg total) by mouth daily with breakfast. 04/12/20  Yes Storm Frisk, MD  Multiple Vitamin (MULTIVITAMIN WITH MINERALS) TABS tablet Take 1 tablet by mouth daily.   Yes [provider]  pantoprazole (PROTONIX) 20 MG tablet Take 1 tablet (20 mg total) by mouth daily. 05/16/20  Yes Adam Phenix, MD   polyethylene glycol powder (GLYCOLAX/MIRALAX) 17 GM/SCOOP powder Take 17 g by mouth 2 (two) times daily as needed for mild constipation. 05/28/20  Yes Glade Lloyd, MD  prazosin (MINIPRESS) 1 MG capsule Take 1 capsule (1 mg total) by mouth at bedtime. Patient taking differently: Take 1 mg by mouth at bedtime as needed (nightmares). 12/06/19  Yes Toy Cookey E, NP  psyllium (METAMUCIL) 58.6 % powder Take 1 packet by mouth daily as needed (low fiber).   Yes [provider]  QUEtiapine (SEROQUEL) 100 MG tablet Take 1 tablet (100 mg total) by mouth at bedtime. 12/06/19  Yes Toy Cookey E, NP  QUEtiapine (SEROQUEL) 25 MG tablet Take 1 tablet (25 mg total) by mouth 2 (two) times daily. 12/06/19  Yes Toy Cookey E, NP  valACYclovir (VALTREX) 1000 MG tablet Take 1 tablet (1,000 mg total) by mouth daily. 05/21/20  Yes Storm Frisk, MD  clobetasol cream (TEMOVATE) 0.05 % Apply 1 application topically 2 (two) times daily. Patient not taking: Reported on 06/15/2020 04/12/20   Storm Frisk, MD  norgestimate-ethinyl estradiol (MILI) 0.25-35 MG-MCG tablet Take 1 tablet by mouth daily. Patient not taking: Reported on 06/15/2020 04/02/20   Alric Seton, MD    Allergies    Patient has no known allergies.  Review of Systems   Review of Systems 10 systems reviewed and negative except as per HPI Physical Exam Updated Vital Signs BP 115/67   Pulse 70   Temp 98.1 F (36.7 C) (Oral)   Resp 17   Ht 5\' 7"  (1.702 m)   Wt 82.6 kg   LMP 05/21/2020 (Exact Date)   SpO2 98%   BMI 28.51 kg/m   Physical Exam Constitutional:      General: She is not in acute distress.    Appearance: Normal appearance. She is well-developed.  HENT:     Head: Normocephalic and atraumatic.     Mouth/Throat:     Pharynx: Oropharynx is clear.  Eyes:     Extraocular Movements: Extraocular movements intact.     Pupils: Pupils are equal, round, and reactive to light.  Cardiovascular:      Rate and Rhythm: Normal rate and regular rhythm.     Heart sounds: Normal heart sounds.  Pulmonary:     Effort: Pulmonary effort is normal.     Breath sounds: Normal breath sounds.  Abdominal:     General: Bowel sounds are normal. There is no distension.     Palpations: Abdomen is soft.     Tenderness: There is no abdominal tenderness.  Musculoskeletal:  General: No swelling or tenderness. Normal range of motion.     Cervical back: Neck supple.     Right lower leg: No edema.     Left lower leg: No edema.  Skin:    General: Skin is warm and dry.  Neurological:     General: No focal deficit present.     Mental Status: She is alert and oriented to person, place, and time.     GCS: GCS eye subscore is 4. GCS verbal subscore is 5. GCS motor subscore is 6.     Comments: Normal finger-nose exam bilaterally.  Cranial nerves II through XII intact.  Motor strength 5\5 upper and lower extremities.  Patient denies difference to light touch left to right.  Psychiatric:        Mood and Affect: Mood normal.    ED Results / Procedures / Treatments   Labs (all labs ordered are listed, but only abnormal results are displayed) Labs Reviewed  URINALYSIS, ROUTINE W REFLEX MICROSCOPIC - Abnormal; Notable for the following components:      Result Value   APPearance HAZY (*)    Ketones, ur 5 (*)    Leukocytes,Ua SMALL (*)    Bacteria, UA RARE (*)    All other components within normal limits  BASIC METABOLIC PANEL  CBC WITH DIFFERENTIAL/PLATELET  HEMOGLOBIN A1C  I-STAT BETA HCG BLOOD, ED ([facility], [facility], [facility] ONLY)    EKG None  Radiology CT Head Wo Contrast  Result Date: 06/15/2020 CLINICAL DATA:  Right-sided facial tingling for 1 day EXAM: CT HEAD WITHOUT CONTRAST TECHNIQUE: Contiguous axial images were obtained from the base of the skull through the vertex without intravenous contrast. COMPARISON:  05/27/2020 FINDINGS: Brain: Geographic area of decreased attenuation in the right frontal lobe  posteriorly consistent with the prior right MCA infarct. The overall appearance is consistent with the expected evolution of the known infarct. No new infarct is seen. No acute hemorrhage is noted. No space-occupying mass lesion is seen. Vascular: No hyperdense vessel or unexpected calcification. Skull: Normal. Negative for fracture or focal lesion. Sinuses/Orbits: No acute finding. Other: None. 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 No acute abnormality noted. Electronically Signed   By: Alcide Clever M.D.   On: 06/15/2020 22:09    Procedures Procedures   Medications Ordered in ED Medications - No data to display  ED Course  I have reviewed the triage vital signs and the nursing notes.  Pertinent labs & imaging results that were available during my care of the patient were reviewed by me and considered in my medical decision making (see chart for details).    MDM Rules/Calculators/A&P                          Patient presents with paresthesias.  She has prior history of stroke.  Exam is normal without any focal motor deficits, cognitive impairment or sensory deficit.  CT shows stable evolving area of stroke from earlier last month.  At this time with no acute findings and stable CT, plan for continued outpatient management.  Careful return precautions reviewed and recommended close follow-up with PCP.  Reinforced importance of compliance with aspirin and Plavix which patient reports she is taking. Final Clinical Impression(s) / ED Diagnoses Final diagnoses:  Paresthesias    Rx / DC Orders ED Discharge Orders     None        Arby Barrette, MD 06/15/20 2255

## 2020-06-15 NOTE — Discharge Instructions (Addendum)
1.  Continue your Plavix and aspirin as prescribed.  Do not miss doses. 2.  Call your neurologist and let them know you are having some tingling sensations.  Schedule a recheck is soon as possible. 3.  Return to emergency department immediately for progression of new or worsening symptoms

## 2020-06-15 NOTE — Telephone Encounter (Signed)
Please advise her Dr Delford Field is out of the office till Tuesday but no change in dose of Metformin is indicated with A1c of 6.1 up from 6.0 rather working on a diabetic diet, exercise is recommended for now.

## 2020-06-15 NOTE — ED Triage Notes (Signed)
Patient c/o right sided tingling, not numbness since 1700 yesterday. Patient reports a history of a stroke 05/22/20.

## 2020-06-15 NOTE — Telephone Encounter (Signed)
Will route to PCP for review. 

## 2020-06-18 ENCOUNTER — Ambulatory Visit (INDEPENDENT_AMBULATORY_CARE_PROVIDER_SITE_OTHER): Payer: Medicaid Other | Admitting: Nurse Practitioner

## 2020-06-18 ENCOUNTER — Other Ambulatory Visit: Payer: Self-pay

## 2020-06-18 VITALS — BP 114/80 | HR 84 | Temp 97.8°F | Resp 18

## 2020-06-18 DIAGNOSIS — R202 Paresthesia of skin: Secondary | ICD-10-CM

## 2020-06-18 DIAGNOSIS — Z8673 Personal history of transient ischemic attack (TIA), and cerebral infarction without residual deficits: Secondary | ICD-10-CM

## 2020-06-18 DIAGNOSIS — I639 Cerebral infarction, unspecified: Secondary | ICD-10-CM

## 2020-06-18 DIAGNOSIS — R200 Anesthesia of skin: Secondary | ICD-10-CM

## 2020-06-18 LAB — HEMOGLOBIN A1C
Hgb A1c MFr Bld: 5.7 % — ABNORMAL HIGH (ref 4.8–5.6)
Mean Plasma Glucose: 117 mg/dL

## 2020-06-18 NOTE — Telephone Encounter (Signed)
Pt was called and informed that medication does not need to be changed.

## 2020-06-18 NOTE — Progress Notes (Signed)
@Patient  ID: Sarah Evans, female    DOB: 01/27/1980, 40 y.o.   MRN: [id number]  Chief Complaint  Patient presents with   Hospitalization Follow-up    Referring provider: Storm Frisk, MD  HPI  Patient presents today for transition of care visit.  Patient was admitted to the ED on 05/23/2020 for CVA.  She returned to the hospital on 05/26/2020 and was readmitted.  She then returned back to the ED on 06/15/2020 for tingling on her right side.  CT scan shows stable evolving area of stroke from May.  Overall patient states that she has been doing well since her last hospital visit.  She states that she is no longer having the tingling on her right side at this time.  She denies any weakness to her right side.  Her gait is steady.  She has no new issues or concerns today.  She states that she is compliant with her medications and does not need any refills at this time.  She does have an appointment set up to see her primary care physician in a couple weeks.  We will place a referral for neurology. Denies f/c/s, n/v/d, hemoptysis, PND, chest pain or edema.      No Known Allergies  Immunization History  Administered Date(s) Administered   Influenza Inj Mdck Quad Pf 01/07/2018   Influenza, Seasonal, Injecte, Preservative Fre 11/27/2015   Influenza,inj,Quad PF,6+ Mos 10/26/2018   Influenza,inj,quad, With Preservative 10/14/2016   PPD Test 01/03/2014   Tdap 11/28/2015, 01/07/2018    Past Medical History:  Diagnosis Date   Bipolar 1 disorder (HCC)    Herpes simplex virus (HSV) infection    dx 2001   Polycystic ovary disease    Schizophrenia (HCC)    Stroke (HCC)     Tobacco History: Social History   Tobacco Use  Smoking Status Former   Packs/day: 0.25   Years: 15.00   Pack years: 3.75   Types: Cigarettes   Quit date: 05/02/2020   Years since quitting: 0.1  Smokeless Tobacco Never   Counseling given: Yes   Outpatient Encounter Medications as of 06/18/2020  Medication Sig    aspirin 81 MG EC tablet Take 1 tablet (81 mg total) by mouth daily. Swallow whole.   atorvastatin (LIPITOR) 10 MG tablet Take 1 tablet (10 mg total) by mouth daily.   busPIRone (BUSPAR) 15 MG tablet Take 1 tablet (15 mg total) by mouth 3 (three) times daily.   cholecalciferol (VITAMIN D3) 25 MCG (1000 UNIT) tablet Take 2,000 Units by mouth daily.   clobetasol cream (TEMOVATE) 0.05 % Apply 1 application topically 2 (two) times daily. (Patient not taking: Reported on 06/15/2020)   cyclobenzaprine (FLEXERIL) 10 MG tablet Take 10 mg by mouth daily as needed for muscle spasms.   metFORMIN (GLUCOPHAGE) 500 MG tablet Take 1 tablet (500 mg total) by mouth daily with breakfast.   Multiple Vitamin (MULTIVITAMIN WITH MINERALS) TABS tablet Take 1 tablet by mouth daily.   norgestimate-ethinyl estradiol (MILI) 0.25-35 MG-MCG tablet Take 1 tablet by mouth daily. (Patient not taking: Reported on 06/15/2020)   pantoprazole (PROTONIX) 20 MG tablet Take 1 tablet (20 mg total) by mouth daily.   polyethylene glycol powder (GLYCOLAX/MIRALAX) 17 GM/SCOOP powder Take 17 g by mouth 2 (two) times daily as needed for mild constipation.   prazosin (MINIPRESS) 1 MG capsule Take 1 capsule (1 mg total) by mouth at bedtime. (Patient taking differently: Take 1 mg by mouth at bedtime as needed (nightmares).)  psyllium (METAMUCIL) 58.6 % powder Take 1 packet by mouth daily as needed (low fiber).   QUEtiapine (SEROQUEL) 100 MG tablet Take 1 tablet (100 mg total) by mouth at bedtime.   QUEtiapine (SEROQUEL) 25 MG tablet Take 1 tablet (25 mg total) by mouth 2 (two) times daily.   valACYclovir (VALTREX) 1000 MG tablet Take 1 tablet (1,000 mg total) by mouth daily.   No facility-administered encounter medications on file as of 06/18/2020.     Review of Systems  Review of Systems  Constitutional: Negative.  Negative for fatigue.  HENT: Negative.    Respiratory:  Negative for cough and shortness of breath.   Cardiovascular:  Negative.   Gastrointestinal: Negative.   Allergic/Immunologic: Negative.   Neurological: Negative.  Negative for dizziness, facial asymmetry, speech difficulty, weakness and numbness.  Psychiatric/Behavioral: Negative.        Physical Exam  BP 114/80   Pulse 84   Temp 97.8 F (36.6 C)   Resp 18   LMP 05/21/2020 (Exact Date)   SpO2 100%   Wt Readings from Last 5 Encounters:  06/15/20 182 lb (82.6 kg)  05/26/20 186 lb (84.4 kg)  05/25/20 186 lb 15.2 oz (84.8 kg)  05/21/20 187 lb 9.6 oz (85.1 kg)  05/16/20 189 lb 6.4 oz (85.9 kg)     Physical Exam Vitals and nursing note reviewed.  Constitutional:      General: She is not in acute distress.    Appearance: She is well-developed.  Cardiovascular:     Rate and Rhythm: Normal rate and regular rhythm.  Pulmonary:     Effort: Pulmonary effort is normal.     Breath sounds: Normal breath sounds.  Neurological:     General: No focal deficit present.     Mental Status: She is alert and oriented to person, place, and time.     Motor: No weakness.     Gait: Gait normal.      Imaging: CT ANGIO HEAD NECK W WO CM  Result Date: 05/24/2020 CLINICAL DATA:  Stroke follow-up. EXAM: CT ANGIOGRAPHY HEAD AND NECK TECHNIQUE: Multidetector CT imaging of the head and neck was performed using the standard protocol during bolus administration of intravenous contrast. Multiplanar CT image reconstructions and MIPs were obtained to evaluate the vascular anatomy. Carotid stenosis measurements (when applicable) are obtained utilizing NASCET criteria, using the distal internal carotid diameter as the denominator. CONTRAST:  12mL OMNIPAQUE IOHEXOL 350 MG/ML SOLN COMPARISON:  CT and MRI head May 23, 2020. FINDINGS: CTA NECK FINDINGS Aortic arch: Great vessel origins are patent. Right carotid system: No evidence of dissection, stenosis (50% or greater) or occlusion. Left carotid system: No evidence of dissection, stenosis (50% or greater) or occlusion.  Vertebral arteries: Left dominant. No evidence of dissection, stenosis (50% or greater) or occlusion. Skeleton: No evidence of acute abnormality on limited assessment. Other neck: No acute abnormality. Upper chest: Metallic foreign body in the right upper lobe. Otherwise, visualized lung apices are clear. Review of the MIP images confirms the above findings CTA HEAD FINDINGS Anterior circulation: No evidence of large vessel occlusion or proximal hemodynamically significant stenosis. While evaluation is limited distally due to venous contamination, suspected high-grade stenosis versus occlusion of a small distal right anterior MCA branch vessel in the region of the right frontal infarct. Posterior circulation: No large vessel occlusion or proximal hemodynamically significant stenosis. Bilateral posterior communicating arteries. None evaluation of the distal PCAs due to venous contamination. Venous sinuses: As permitted by contrast timing, patent. Review  of the MIP images confirms the above findings Final report was delayed due to power outage that occurred at the time of dictation. Final report was discussed with Dr. Roda Shutters at 10:58 PM. IMPRESSION: 1. No large vessel occlusion or evidence of proximal hemodynamically significant stenosis. 2. While evaluation is limited distally due to venous contamination, suspected high-grade stenosis versus occlusion of a small distal right anterior MCA branch vessel in the region of the right frontal infarct. Electronically Signed   By: Feliberto Harts MD   On: 05/24/2020 11:09   CT Head Wo Contrast  Result Date: 06/15/2020 CLINICAL DATA:  Right-sided facial tingling for 1 day EXAM: CT HEAD WITHOUT CONTRAST TECHNIQUE: Contiguous axial images were obtained from the base of the skull through the vertex without intravenous contrast. COMPARISON:  05/27/2020 FINDINGS: Brain: Geographic area of decreased attenuation in the right frontal lobe posteriorly consistent with the prior right  MCA infarct. The overall appearance is consistent with the expected evolution of the known infarct. No new infarct is seen. No acute hemorrhage is noted. No space-occupying mass lesion is seen. Vascular: No hyperdense vessel or unexpected calcification. Skull: Normal. Negative for fracture or focal lesion. Sinuses/Orbits: No acute finding. Other: None. 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 No acute abnormality noted. Electronically Signed   By: Alcide Clever M.D.   On: 06/15/2020 22:09   CT HEAD WO CONTRAST  Result Date: 05/23/2020 CLINICAL DATA:  40 year old female with dizziness and left hand weakness since yesterday. EXAM: CT HEAD WITHOUT CONTRAST TECHNIQUE: Contiguous axial images were obtained from the base of the skull through the vertex without intravenous contrast. COMPARISON:  None. FINDINGS: Brain: Abnormal roughly 3 cm area of loss gray-white matter differentiation in the anterior right middle frontal gyrus near the frontal operculum (series 2, image 18 and series 5, image 22). Heterogeneous density there raising the possibility of petechial hemorrhage. No significant mass effect. Elsewhere gray-white matter differentiation appears to be normal. No midline shift, ventriculomegaly. No other acute or chronic cortically based infarct. Vascular: No suspicious intracranial vascular hyperdensity. Skull: Negative. Sinuses/Orbits: Visualized paranasal sinuses and mastoids are clear. Tympanic cavities appear clear. Other: Visualized orbits and scalp soft tissues are within normal limits. IMPRESSION: Abnormal 3 cm area of mixed density in the right middle frontal gyrus. The noncontrast CT appearance most resembles a recent infarct with petechial hemorrhage. No significant mass effect. But other differential considerations include tumor and sequelae of a vascular malformation. Recommend Brain MRI without and with contrast to further characterize. Electronically Signed   By: Odessa Fleming M.D.    On: 05/23/2020 09:37   MR BRAIN WO CONTRAST  Result Date: 05/27/2020 CLINICAL DATA:  40 year old female code stroke presentation on 05/23/2020 with MRI revealing scattered restricted diffusion/infarcts in the right MCA and right PCA territories. EXAM: MRI HEAD WITHOUT CONTRAST TECHNIQUE: Multiplanar, multiecho pulse sequences of the brain and surrounding structures were obtained without intravenous contrast. COMPARISON:  Brain MRI without and with contrast 05/23/2020. CTA head and neck 05/24/2020. FINDINGS: Brain: Confluent roughly 3.5 cm area of restricted diffusion in the right middle frontal gyrus along the anterior operculum, as well as multiple scattered areas of cortical restricted diffusion in the right perirolandic cortex, right parietal lobe, and right occipital lobe are stable to mildly diminished since the MRI 4 days ago. No new diffusion restriction. Stable cytotoxic edema at the affected areas. Stable petechial hemorrhage in the dominant infarct. No significant intracranial mass effect. No new signal abnormality identified. No chronic encephalomalacia or  other intracranial blood products identified. No midline shift, mass effect, evidence of mass lesion, ventriculomegaly, extra-axial collection. Cervicomedullary junction and pituitary are within normal limits. Vascular: Major intracranial vascular flow voids are stable. Skull and upper cervical spine: Negative. Sinuses/Orbits: Negative. Other: Mastoids remain clear. Stable visible orbits soft tissues. Negative visible scalp and face. IMPRESSION: 1. Expected evolution of scattered right MCA and PCA infarcts over the past 4 days. Stable petechial hemorrhage in the dominant right middle frontal gyrus infarct. No significant mass effect. 2. No new intracranial abnormality. Electronically Signed   By: Odessa Fleming M.D.   On: 05/27/2020 08:47   MR Brain W and Wo Contrast  Result Date: 05/23/2020 CLINICAL DATA:  Abnormal head CT.  Left upper extremity  weakness. EXAM: MRI HEAD WITHOUT AND WITH CONTRAST TECHNIQUE: Multiplanar, multiecho pulse sequences of the brain and surrounding structures were obtained without and with intravenous contrast. CONTRAST:  8.16mL GADAVIST GADOBUTROL 1 MMOL/ML IV SOLN COMPARISON:  None. FINDINGS: Brain: There is multifocal abnormal diffusion restriction within the right hemisphere, affecting the right middle and precentral gyri and the posterior right parietal lobe and right occipital lobe. Petechial blood products associated with the right frontal lesion. There is abnormal leptomeningeal contrast enhancement associated with the diffusion restricting lesions. Mild edema at the lesion sites but otherwise normal white matter signal. The midline structures are normal. Vascular: Major flow voids are preserved. Skull and upper cervical spine: Normal calvarium and skull base. Visualized upper cervical spine and soft tissues are normal. Sinuses/Orbits:No paranasal sinus fluid levels or advanced mucosal thickening. No mastoid or middle ear effusion. Normal orbits. IMPRESSION: 1. Multiple diffusion restricting cortical/subcortical lesions within the right hemisphere with overlying leptomeningeal contrast enhancement, most consistent with subacute infarcts. One of the lesions directly affects the hand region of the motor strip. 2. Petechial blood products associated with the right frontal lesion site. Heidelberg classification 1a: HI1, scattered small petechiae, no mass effect. Electronically Signed   By: Deatra Robinson M.D.   On: 05/23/2020 19:22   ECHO TEE  Result Date: 05/25/2020    TRANSESOPHOGEAL ECHO REPORT   Patient Name:   MIDORI DADO Date of Exam: 05/25/2020 Medical Rec #:  [id number]     Height:       67.0 in Accession #:    [id number]    Weight:       186.9 lb Date of Birth:  1980-06-18     BSA:          1.965 m Patient Age:    40 years      BP:           118/82 mmHg Patient Gender: F             HR:           61 bpm. Exam  Location:  Inpatient Procedure: 2D Echo, Cardiac Doppler, Color Doppler and Saline Contrast Bubble            Study Indications:     Stroke  History:         Patient has no prior history of Echocardiogram examinations.  Sonographer:     Roosvelt Maser RDCS Referring Phys:  [id number] HAO MENG Diagnosing Phys: Zoila Shutter MD PROCEDURE: The transesophogeal probe was passed without difficulty through the esophogus of the patient. Local oropharyngeal anesthetic was provided with Cetacaine. Sedation performed by different physician. The patient was monitored while under deep sedation. The patient developed no complications during the procedure. IMPRESSIONS  1. Left ventricular  ejection fraction, by estimation, is 60 to 65%. The left ventricle has normal function.  2. Right ventricular systolic function is normal. The right ventricular size is normal.  3. No left atrial/left atrial appendage thrombus was detected.  4. The mitral valve is grossly normal. Trivial mitral valve regurgitation.  5. The aortic valve is tricuspid. Aortic valve regurgitation is not visualized.  6. Evidence of atrial level shunting detected by color flow Doppler. Agitated saline contrast bubble study was positive with shunting observed after >6 cardiac cycles suggestive of intrapulmonary shunting. Conclusion(s)/Recommendation(s): No LA/LAA thrombus identified. Negative bubble study for interatrial shunt. No intracardiac source of embolism detected on this on this transesophageal echocardiogram. FINDINGS  Left Ventricle: Left ventricular ejection fraction, by estimation, is 60 to 65%. The left ventricle has normal function. The left ventricular internal cavity size was normal in size. Right Ventricle: The right ventricular size is normal. No increase in right ventricular wall thickness. Right ventricular systolic function is normal. Left Atrium: Left atrial size was normal in size. Spontaneous echo contrast was present. No left atrial/left atrial  appendage thrombus was detected. Right Atrium: Right atrial size was normal in size. Pericardium: There is no evidence of pericardial effusion. Mitral Valve: The mitral valve is grossly normal. Trivial mitral valve regurgitation. Tricuspid Valve: The tricuspid valve is grossly normal. Tricuspid valve regurgitation is trivial. Aortic Valve: The aortic valve is tricuspid. Aortic valve regurgitation is not visualized. Pulmonic Valve: The pulmonic valve was grossly normal. Pulmonic valve regurgitation is not visualized. Aorta: The aortic root and ascending aorta are structurally normal, with no evidence of dilitation. IAS/Shunts: Evidence of atrial level shunting detected by color flow Doppler. Agitated saline contrast was given intravenously to evaluate for intracardiac shunting. Agitated saline contrast bubble study was positive with shunting observed after >6 cardiac cycles suggestive of intrapulmonary shunting. Zoila Shutter MD Electronically signed by Zoila Shutter MD Signature Date/Time: 05/25/2020/3:32:22 PM    Final    ECHOCARDIOGRAM COMPLETE BUBBLE STUDY  Result Date: 05/24/2020    ECHOCARDIOGRAM REPORT   Patient Name:   EMILLIA WEATHERLY Date of Exam: 05/24/2020 Medical Rec #:  [id number]     Height:       67.0 in Accession #:    [id number]    Weight:       187.0 lb Date of Birth:  12-Jul-1980     BSA:          1.965 m Patient Age:    40 years      BP:           121/79 mmHg Patient Gender: F             HR:           81 bpm. Exam Location:  Inpatient Procedure: 2D Echo, Color Doppler, Cardiac Doppler, 3D Echo and Saline Contrast            Bubble Study                               MODIFIED REPORT: This report was modified by Weston Brass MD on 05/24/2020 due to revision.  Indications:     Stroke 434.91 / I63.  History:         Patient has no prior history of Echocardiogram examinations.                  Risk Factors:Hypertension, Diabetes and Dyslipidemia.  Sonographer:  Leta Jungling RDCS Referring Phys:   [id number] Gordy Councilman Diagnosing Phys: Weston Brass MD IMPRESSIONS  1. Left ventricular ejection fraction, by estimation, is 60 to 65%. The left ventricle has normal function. The left ventricle has no regional wall motion abnormalities. Left ventricular diastolic parameters were normal. The average left ventricular global longitudinal strain is -21.0 %. The global longitudinal strain is normal.  2. Right ventricular systolic function is normal. The right ventricular size is normal. There is normal pulmonary artery systolic pressure. The estimated right ventricular systolic pressure is 21.3 mmHg.  3. The mitral valve is normal in structure. No evidence of mitral valve regurgitation. No evidence of mitral stenosis.  4. The aortic valve is tricuspid. Aortic valve regurgitation is not visualized. No aortic stenosis is present.  5. The inferior vena cava is normal in size with greater than 50% respiratory variability, suggesting right atrial pressure of 3 mmHg.  6. Agitated saline contrast bubble study was negative, with no evidence of any interatrial shunt. Conclusion(s)/Recommendation(s): No intracardiac source of embolism detected on this transthoracic study. A transesophageal echocardiogram is recommended to exclude cardiac source of embolism if clinically indicated. FINDINGS  Left Ventricle: Left ventricular ejection fraction, by estimation, is 60 to 65%. The left ventricle has normal function. The left ventricle has no regional wall motion abnormalities. The average left ventricular global longitudinal strain is -21.0 %. The global longitudinal strain is normal. 3D left ventricular ejection fraction analysis performed but not reported based on interpreter judgement due to suboptimal quality. The left ventricular internal cavity size was normal in size. There is no left ventricular hypertrophy. Left ventricular diastolic parameters were normal. Right Ventricle: The right ventricular size is normal. No  increase in right ventricular wall thickness. Right ventricular systolic function is normal. There is normal pulmonary artery systolic pressure. The tricuspid regurgitant velocity is 2.14 m/s, and  with an assumed right atrial pressure of 3 mmHg, the estimated right ventricular systolic pressure is 21.3 mmHg. Left Atrium: Left atrial size was normal in size. Right Atrium: Right atrial size was normal in size. Pericardium: There is no evidence of pericardial effusion. Mitral Valve: The mitral valve is normal in structure. No evidence of mitral valve regurgitation. No evidence of mitral valve stenosis. Tricuspid Valve: The tricuspid valve is normal in structure. Tricuspid valve regurgitation is trivial. No evidence of tricuspid stenosis. Aortic Valve: The aortic valve is tricuspid. Aortic valve regurgitation is not visualized. No aortic stenosis is present. Pulmonic Valve: The pulmonic valve was normal in structure. Pulmonic valve regurgitation is not visualized. No evidence of pulmonic stenosis. Aorta: No hemodynamically significant coarctation of the aorta. The aortic root is normal in size and structure. Venous: The inferior vena cava is normal in size with greater than 50% respiratory variability, suggesting right atrial pressure of 3 mmHg. IAS/Shunts: No atrial level shunt detected by color flow Doppler. Agitated saline contrast was given intravenously to evaluate for intracardiac shunting. Agitated saline contrast bubble study was negative, with no evidence of any interatrial shunt.  LEFT VENTRICLE PLAX 2D LVOT diam:     1.90 cm  Diastology LV SV:         58       LV e' medial:    9.03 cm/s LV SV Index:   30       LV E/e' medial:  6.0 LVOT Area:     2.84 cm LV e' lateral:   16.60 cm/s  LV E/e' lateral: 3.3                          2D Longitudinal Strain                         2D Strain GLS Avg:     -21.0 %                          3D Volume EF                         LV 3D EF:    59.00 %                          LV 3D EDV:   97400.00 mm                         LV 3D ESV:   40000.00 mm                         LV 3D SV:    57400.00 mm RIGHT VENTRICLE RV S prime:     12.60 cm/s TAPSE (M-mode): 1.8 cm LEFT ATRIUM             Index       RIGHT ATRIUM           Index LA Vol (A2C):   30.8 ml 15.67 ml/m RA Area:     11.30 cm LA Vol (A4C):   28.7 ml 14.60 ml/m RA Volume:   23.30 ml  11.86 ml/m LA Biplane Vol: 31.2 ml 15.88 ml/m  AORTIC VALVE LVOT Vmax:   129.00 cm/s LVOT Vmean:  84.600 cm/s LVOT VTI:    0.206 m  AORTA Ao Root diam: 3.00 cm Ao Asc diam:  2.70 cm MITRAL VALVE               TRICUSPID VALVE MV Area (PHT): 3.08 cm    TR Peak grad:   18.3 mmHg MV Decel Time: 246 msec    TR Vmax:        214.00 cm/s MV E velocity: 54.20 cm/s MV A velocity: 48.30 cm/s  SHUNTS MV E/A ratio:  1.12        Systemic VTI:  0.21 m                            Systemic Diam: 1.90 cm Weston BrassGayatri Acharya MD Electronically signed by Weston BrassGayatri Acharya MD Signature Date/Time: 05/24/2020/4:52:49 PM    Final (Updated)    CT HEAD CODE STROKE WO CONTRAST  Result Date: 05/27/2020 CLINICAL DATA:  Code stroke.  Numbness of the right hand and foot. EXAM: CT HEAD WITHOUT CONTRAST TECHNIQUE: Contiguous axial images were obtained from the base of the skull through the vertex without intravenous contrast. COMPARISON:  None. FINDINGS: Brain: There is no mass, hemorrhage or extra-axial collection. The size and configuration of the ventricles and extra-axial CSF spaces are normal. There is a recent infarction within the right frontal lobe. No abnormality within the left hemisphere. Vascular: No abnormal hyperdensity of the major intracranial arteries or dural venous sinuses. No intracranial atherosclerosis. Skull: The visualized skull base, calvarium and extracranial soft tissues are normal. Sinuses/Orbits: No fluid levels or  advanced mucosal thickening of the visualized paranasal sinuses. No mastoid or middle ear effusion. The orbits are  normal. ASPECTS [facility] Stroke Program Early CT Score) -- left hemisphere - Ganglionic level infarction (caudate, lentiform nuclei, internal capsule, insula, M1-M3 cortex): 7 - Supraganglionic infarction (M4-M6 cortex): 3 Total score (0-10 with 10 being normal): 10 IMPRESSION: 1. Right frontal lobe recent infarction without hemorrhage or mass effect. 2. ASPECTS is 10. These results were called by telephone at the time of interpretation on 05/27/2020 at 12:47 am to provider [facility] , who verbally acknowledged these results. Electronically Signed   By: Deatra Robinson M.D.   On: 05/27/2020 00:47   VAS [place] LOWER EXTREMITY VENOUS (DVT)  Result Date: 05/24/2020  Lower Venous DVT Study Patient Name:  SUPRINA MANDEVILLE  Date of Exam:   05/24/2020 Medical Rec #: [id number]      Accession #:    [id number] Date of Birth: 10/22/1980      Patient Gender: F Patient Age:   36Y Exam Location:  [facility] Procedure:      VAS [place] LOWER EXTREMITY VENOUS (DVT) Referring Phys: [id number] Marvel Plan --------------------------------------------------------------------------------  Indications: Stroke.  Comparison Study: no prior Performing Technologist: Blanch Media RVS  Examination Guidelines: A complete evaluation includes B-mode imaging, spectral Doppler, color Doppler, and power Doppler as needed of all accessible portions of each vessel. Bilateral testing is considered an integral part of a complete examination. Limited examinations for reoccurring indications may be performed as noted. The reflux portion of the exam is performed with the patient in reverse Trendelenburg.  +---------+---------------+---------+-----------+----------+-------------------+ RIGHT    CompressibilityPhasicitySpontaneityPropertiesThrombus Aging      +---------+---------------+---------+-----------+----------+-------------------+ CFV      Full           Yes      Yes                                       +---------+---------------+---------+-----------+----------+-------------------+ SFJ      Full                                                             +---------+---------------+---------+-----------+----------+-------------------+ FV Prox  Full                                                             +---------+---------------+---------+-----------+----------+-------------------+ FV Mid   Full                                                             +---------+---------------+---------+-----------+----------+-------------------+ FV DistalFull                                                             +---------+---------------+---------+-----------+----------+-------------------+  PFV      Full                                                             +---------+---------------+---------+-----------+----------+-------------------+ POP      Full           Yes      Yes                                      +---------+---------------+---------+-----------+----------+-------------------+ PTV      Full                                                             +---------+---------------+---------+-----------+----------+-------------------+ PERO                                                  Not well visualized +---------+---------------+---------+-----------+----------+-------------------+   +---------+---------------+---------+-----------+----------+--------------+ LEFT     CompressibilityPhasicitySpontaneityPropertiesThrombus Aging +---------+---------------+---------+-----------+----------+--------------+ CFV      Full           Yes      Yes                                 +---------+---------------+---------+-----------+----------+--------------+ SFJ      Full                                                        +---------+---------------+---------+-----------+----------+--------------+ FV Prox  Full                                                         +---------+---------------+---------+-----------+----------+--------------+ FV Mid   Full                                                        +---------+---------------+---------+-----------+----------+--------------+ FV DistalFull                                                        +---------+---------------+---------+-----------+----------+--------------+ PFV      Full                                                        +---------+---------------+---------+-----------+----------+--------------+  POP      Full           Yes      Yes                                 +---------+---------------+---------+-----------+----------+--------------+ PTV      Full                                                        +---------+---------------+---------+-----------+----------+--------------+ PERO     Full                                                        +---------+---------------+---------+-----------+----------+--------------+     Summary: BILATERAL: - No evidence of deep vein thrombosis seen in the lower extremities, bilaterally. -No evidence of popliteal cyst, bilaterally.   *See table(s) above for measurements and observations. Electronically signed by Lemar Livings MD on 05/24/2020 at 3:36:31 PM.    Final      Assessment & Plan:   History of ischemic stroke CT on 06/15/20 stable  Will place referal to neurology  Continue current medications  Stay active  Stay well hydrated  Follow up:  Follow up with Dr. Delford Field as scheduled   Patient Instructions  History of ischemic stroke:  CT on 06/15/20 stable  Will place referal to neurology  Continue current medications  Stay active  Stay well hydrated  Follow up:  Follow up with Dr. Delford Field as scheduled  Ischemic Stroke  An ischemic stroke is the sudden death of brain tissue. This type of stroke happens when part of the brain does not get enough blood.  This can cause lifelong change in how the brain works. This change can cause problems in otherparts of the body. This condition is an emergency. It must be treated right away. What are the causes? This condition is caused by lower blood flow to part of the brain. This may be due to: A small clump, or clot, of blood. A buildup of fatty substance (plaque) in the blood vessels. An abnormal heart rhythm. A blocked or damaged artery in the head or neck. Arteries are blood vessels that move blood away from the heart. Infection. Swelling of the arteries in the brain. What increases the risk? Things that you can change Other medical problems, such as: High blood pressure (hypertension). Heart disease. Diabetes. High cholesterol. Being very overweight (obese). Paused or stopped breathing during sleep (sleep apnea). Migraine headache. Smoking or other tobacco use. Not being active. Heavy alcohol use. Using drugs. Taking birth control pills. Things that you cannot change Being older than age 64. Having had blood clots, stroke, or mini-stroke (transient ischemic attack, TIA) before. High blood pressure when you are pregnant, in women. Stroke in your family. Sickle cell disease. Disorders that affect how blood clots. What are the signs or symptoms? Symptoms of a stroke normally happen all of a sudden. They can include: Weakness or loss of feeling in your face, arm, or leg, often on one side of the body. Loss of balance. Loss of controlled, correct movement of your body parts (coordination).  Slurred speech. Trouble talking or trouble understanding what people say. Problems with not seeing things correctly. Feeling dizzy or confused. Feeling like you may vomit (nauseous) and vomiting. A very bad headache for no reason. If you can, write down the exact time that you last felt normal and what time you first had symptoms. Tell your doctor. If symptoms come and go, they couldbe caused by a  mini-stroke. Get help right away, even if you feel better. How is this treated? You must get treatment as soon as you have stroke symptoms. Some treatments work better if they are done within 3-6 hours of your first symptoms. You may get medicines that: Take out or break up the blood clot. Control blood pressure. Thin your blood. Other treatments may include: Treatment for breathing. Fluids through an IV tube. Procedures that make blood flow better. You may need to manage your risk of stroke with medicines and diet changes.After a stroke, you may get therapy to help you get better. Follow these instructions at home: Medicines Take over-the-counter and prescription medicines only as told by your doctor. If you were told to take aspirin or another medicine to thin your blood, take it exactly as told. Take it at the same time each day. Taking too much of the medicine can cause bleeding. If you do not take enough medicine, it may not work as well. Know the side effects of your medicines. If you are taking a blood thinner, make sure you: Hold pressure over any cuts for longer than normal. Tell your dentist and other doctors that you take this medicine. Avoid activities that could hurt or bruise you. Eating and drinking Follow instructions from your doctor about diet. Eat healthy foods. If you have trouble swallowing: Take small bites when eating. Eat foods that are soft or pureed. Safety Follow instructions from your care team about physical activity. Use a walker or cane as told by your doctor. Keep your home safe so you do not fall. You may need to: Have experts look at your home to make sure it is safe. Put grab bars in the bedroom and bathroom. Use raised toilets. Put a seat in the shower. General instructions Do not use any products that contain nicotine or tobacco, such as cigarettes, e-cigarettes, and chewing tobacco. If you need help quitting, ask your doctor. If you drink  alcohol: Limit how much you use to: 0-1 drink a day for women. 0-2 drinks a day for men. Be aware of how much alcohol is in your drink. In the [place] one drink equals one 12 oz bottle of beer (355 mL), one 5 oz glass of wine (148 mL), or one 1 oz glass of hard liquor (44 mL). If you need help to stop using drugs or alcohol, ask your doctor to refer you to a program or specialist. Stay active. Exercise as told. Wear a medical bracelet as told by your doctor. Keep all follow-up visits as told by your doctor. Go to visits with all specialists on your care team. This is important. How is this prevented? You can lower your risk of another stroke if you: Manage high blood pressure, high cholesterol, diabetes, heart disease, sleep problems, and weight. Quit smoking, limit alcohol, and stay active. Work with your doctor to care for yourself after a stroke. This may keep youfrom getting more problems. Get help right away if: You have any signs of a stroke. "BE FAST" is an easy way to remember the main warning signs: B -  Balance. Signs are dizziness, sudden trouble walking, or loss of balance. E - Eyes. Signs are trouble seeing or a change in how you see. F - Face. Signs are sudden weakness or loss of feeling of the face, or the face or eyelid drooping on one side. A - Arms. Signs are weakness or loss of feeling in an arm. This happens suddenly and usually on one side of the body. S - Speech. Signs are sudden trouble speaking, slurred speech, or trouble understanding what people say. T - Time. Time to call emergency services. Write down what time symptoms started. You have other signs of a stroke, such as: A sudden, very bad headache with no known cause. Feeling like you may vomit. Vomiting. A seizure. These symptoms may be an emergency. Do not wait to see if the symptoms will go away. Get medical help right away. Call your local emergency services (911 in the [place] Do not drive yourself to the  hospital. Summary An ischemic stroke is the sudden death of brain tissue. Symptoms of a stroke often happen all of a sudden. You must get treatment as soon as you have stroke symptoms. Stroke is an emergency. It must be treated right away. This information is not intended to replace advice given to you by your health care provider. Make sure you discuss any questions you have with your healthcare provider. Document Revised: 12/20/2018 Document Reviewed: 12/20/2018 Elsevier Patient Education  [address]   Sarah Evans, [place] 06/18/2020

## 2020-06-18 NOTE — Patient Instructions (Signed)
History of ischemic stroke:  CT on 06/15/20 stable  Will place referal to neurology  Continue current medications  Stay active  Stay well hydrated  Follow up:  Follow up with Dr. Delford Field as scheduled  Ischemic Stroke  An ischemic stroke is the sudden death of brain tissue. This type of stroke happens when part of the brain does not get enough blood. This can cause lifelong change in how the brain works. This change can cause problems in otherparts of the body. This condition is an emergency. It must be treated right away. What are the causes? This condition is caused by lower blood flow to part of the brain. This may be due to: A small clump, or clot, of blood. A buildup of fatty substance (plaque) in the blood vessels. An abnormal heart rhythm. A blocked or damaged artery in the head or neck. Arteries are blood vessels that move blood away from the heart. Infection. Swelling of the arteries in the brain. What increases the risk? Things that you can change Other medical problems, such as: High blood pressure (hypertension). Heart disease. Diabetes. High cholesterol. Being very overweight (obese). Paused or stopped breathing during sleep (sleep apnea). Migraine headache. Smoking or other tobacco use. Not being active. Heavy alcohol use. Using drugs. Taking birth control pills. Things that you cannot change Being older than age 89. Having had blood clots, stroke, or mini-stroke (transient ischemic attack, TIA) before. High blood pressure when you are pregnant, in women. Stroke in your family. Sickle cell disease. Disorders that affect how blood clots. What are the signs or symptoms? Symptoms of a stroke normally happen all of a sudden. They can include: Weakness or loss of feeling in your face, arm, or leg, often on one side of the body. Loss of balance. Loss of controlled, correct movement of your body parts (coordination). Slurred speech. Trouble talking or  trouble understanding what people say. Problems with not seeing things correctly. Feeling dizzy or confused. Feeling like you may vomit (nauseous) and vomiting. A very bad headache for no reason. If you can, write down the exact time that you last felt normal and what time you first had symptoms. Tell your doctor. If symptoms come and go, they couldbe caused by a mini-stroke. Get help right away, even if you feel better. How is this treated? You must get treatment as soon as you have stroke symptoms. Some treatments work better if they are done within 3-6 hours of your first symptoms. You may get medicines that: Take out or break up the blood clot. Control blood pressure. Thin your blood. Other treatments may include: Treatment for breathing. Fluids through an IV tube. Procedures that make blood flow better. You may need to manage your risk of stroke with medicines and diet changes.After a stroke, you may get therapy to help you get better. Follow these instructions at home: Medicines Take over-the-counter and prescription medicines only as told by your doctor. If you were told to take aspirin or another medicine to thin your blood, take it exactly as told. Take it at the same time each day. Taking too much of the medicine can cause bleeding. If you do not take enough medicine, it may not work as well. Know the side effects of your medicines. If you are taking a blood thinner, make sure you: Hold pressure over any cuts for longer than normal. Tell your dentist and other doctors that you take this medicine. Avoid activities that could hurt or bruise you. Eating  and drinking Follow instructions from your doctor about diet. Eat healthy foods. If you have trouble swallowing: Take small bites when eating. Eat foods that are soft or pureed. Safety Follow instructions from your care team about physical activity. Use a walker or cane as told by your doctor. Keep your home safe so you do  not fall. You may need to: Have experts look at your home to make sure it is safe. Put grab bars in the bedroom and bathroom. Use raised toilets. Put a seat in the shower. General instructions Do not use any products that contain nicotine or tobacco, such as cigarettes, e-cigarettes, and chewing tobacco. If you need help quitting, ask your doctor. If you drink alcohol: Limit how much you use to: 0-1 drink a day for women. 0-2 drinks a day for men. Be aware of how much alcohol is in your drink. In the [place] one drink equals one 12 oz bottle of beer (355 mL), one 5 oz glass of wine (148 mL), or one 1 oz glass of hard liquor (44 mL). If you need help to stop using drugs or alcohol, ask your doctor to refer you to a program or specialist. Stay active. Exercise as told. Wear a medical bracelet as told by your doctor. Keep all follow-up visits as told by your doctor. Go to visits with all specialists on your care team. This is important. How is this prevented? You can lower your risk of another stroke if you: Manage high blood pressure, high cholesterol, diabetes, heart disease, sleep problems, and weight. Quit smoking, limit alcohol, and stay active. Work with your doctor to care for yourself after a stroke. This may keep youfrom getting more problems. Get help right away if: You have any signs of a stroke. "BE FAST" is an easy way to remember the main warning signs: B - Balance. Signs are dizziness, sudden trouble walking, or loss of balance. E - Eyes. Signs are trouble seeing or a change in how you see. F - Face. Signs are sudden weakness or loss of feeling of the face, or the face or eyelid drooping on one side. A - Arms. Signs are weakness or loss of feeling in an arm. This happens suddenly and usually on one side of the body. S - Speech. Signs are sudden trouble speaking, slurred speech, or trouble understanding what people say. T - Time. Time to call emergency services. Write down what  time symptoms started. You have other signs of a stroke, such as: A sudden, very bad headache with no known cause. Feeling like you may vomit. Vomiting. A seizure. These symptoms may be an emergency. Do not wait to see if the symptoms will go away. Get medical help right away. Call your local emergency services (911 in the [place] Do not drive yourself to the hospital. Summary An ischemic stroke is the sudden death of brain tissue. Symptoms of a stroke often happen all of a sudden. You must get treatment as soon as you have stroke symptoms. Stroke is an emergency. It must be treated right away. This information is not intended to replace advice given to you by your health care provider. Make sure you discuss any questions you have with your healthcare provider. Document Revised: 12/20/2018 Document Reviewed: 12/20/2018 Elsevier Patient Education  2021 [employer].

## 2020-06-18 NOTE — Assessment & Plan Note (Signed)
CT on 06/15/20 stable  Will place referal to neurology  Continue current medications  Stay active  Stay well hydrated  Follow up:  Follow up with Dr. Delford Field as scheduled

## 2020-07-02 ENCOUNTER — Ambulatory Visit: Payer: Medicaid Other

## 2020-07-03 ENCOUNTER — Inpatient Hospital Stay: Payer: Medicaid Other | Admitting: Neurology

## 2020-07-10 ENCOUNTER — Other Ambulatory Visit: Payer: Self-pay

## 2020-07-10 ENCOUNTER — Inpatient Hospital Stay: Payer: Medicaid Other | Admitting: Critical Care Medicine

## 2020-07-16 ENCOUNTER — Ambulatory Visit (HOSPITAL_COMMUNITY)
Admission: EM | Admit: 2020-07-16 | Discharge: 2020-07-16 | Disposition: A | Discharge status: Home / Self Care | Payer: Medicaid Other | Attending: Internal Medicine | Admitting: Internal Medicine

## 2020-07-16 ENCOUNTER — Ambulatory Visit: Payer: Medicaid Other

## 2020-07-16 ENCOUNTER — Encounter (HOSPITAL_COMMUNITY): Payer: Self-pay

## 2020-07-16 ENCOUNTER — Other Ambulatory Visit: Payer: Self-pay

## 2020-07-16 DIAGNOSIS — H6692 Otitis media, unspecified, left ear: Secondary | ICD-10-CM

## 2020-07-16 MED ORDER — AMOXICILLIN 875 MG PO TABS
875.0000 mg | ORAL_TABLET | Freq: Two times a day (BID) | ORAL | 0 refills | Status: AC
  Filled 2020-07-16: qty 20, 10d supply, fill #0

## 2020-07-16 MED ORDER — CETIRIZINE HCL 10 MG PO TABS
10.0000 mg | ORAL_TABLET | Freq: Every day | ORAL | 0 refills | Status: DC
  Filled 2020-07-16: qty 15, 15d supply, fill #0

## 2020-07-16 NOTE — ED Triage Notes (Signed)
Pt presents with c/o ringing in the left ear X  States she has had the feeling of water inside her ear. States it is agitating her nerves.    States he had a stroke in may and states she has brain damage. Pt is c/o it affecting her hearing now.

## 2020-07-16 NOTE — ED Provider Notes (Signed)
[facility]    CSN: [id number] Arrival date & time: 07/16/20  1030      History   Chief Complaint Chief Complaint  Patient presents with   Ear Pain    HPI Sarah Evans is a 40 y.o. female.   Patient presents with 2-day history of left ear pain.  Denies pain in right ear.  Denies any upper respiratory symptoms or sore throat.  Denies any fever.  Patient denies having taken anything over-the-counter for pain relief.  Denies decreased hearing in left ear.    Past Medical History:  Diagnosis Date   Bipolar 1 disorder (HCC)    Herpes simplex virus (HSV) infection    dx 2001   Polycystic ovary disease    Schizophrenia (HCC)    Stroke [facility])     Patient Active Problem List   Diagnosis Date Noted   GERD without esophagitis 05/27/2020   History of ischemic stroke 05/27/2020   Numbness and tingling 05/24/2020   Ischemic stroke (HCC) 05/23/2020   BMI 29.0-29.9,adult 05/21/2020   HSV (herpes simplex virus) anogenital infection 05/21/2020   Prediabetes 04/12/2020   Intrinsic eczema 04/12/2020   Mixed hyperlipidemia 04/12/2020   Muscle spasticity 04/12/2020   Moderate episode of recurrent major depressive disorder (HCC) 06/09/2019   Generalized anxiety disorder 06/09/2019   Bipolar disorder, current episode depressed, moderate (HCC) 04/28/2018   Personal history of nonsuicidal self-harm 04/28/2018   PTSD (post-traumatic stress disorder) 04/30/2015   Schizoaffective disorder, bipolar type (HCC) 04/27/2015    Past Surgical History:  Procedure Laterality Date   BUBBLE STUDY  05/25/2020   Procedure: BUBBLE STUDY;  Surgeon: Chrystie Nose, MD;  Location: [facility] ENDOSCOPY;  Service: Cardiovascular;;   NO PAST SURGERIES     TEE WITHOUT CARDIOVERSION N/A 05/25/2020   Procedure: TRANSESOPHAGEAL ECHOCARDIOGRAM (TEE);  Surgeon: Chrystie Nose, MD;  Location: [facility] ENDOSCOPY;  Service: Cardiovascular;  Laterality: N/A;    OB History     Gravida  1   Para  1   Term  0    Preterm  1   AB  0   Living  0      SAB  0   IAB  0   Ectopic  0   Multiple  0   Live Births  1            Home Medications    Prior to Admission medications   Medication Sig Start Date End Date Taking? Authorizing Provider  amoxicillin (AMOXIL) 875 MG tablet Take 1 tablet (875 mg total) by mouth 2 (two) times daily for 10 days. 07/16/20 07/26/20 Yes Lance Muss, FNP  cetirizine (ZYRTEC) 10 MG tablet Take 1 tablet (10 mg total) by mouth daily. 07/16/20  Yes Lance Muss, FNP  atorvastatin (LIPITOR) 10 MG tablet Take 1 tablet (10 mg total) by mouth daily. 04/12/20   Storm Frisk, MD  busPIRone (BUSPAR) 15 MG tablet Take 1 tablet (15 mg total) by mouth 3 (three) times daily. 12/06/19   Shanna Cisco, NP  cholecalciferol (VITAMIN D3) 25 MCG (1000 UNIT) tablet Take 2,000 Units by mouth daily.    [provider]  clobetasol cream (TEMOVATE) 0.05 % Apply 1 application topically 2 (two) times daily. Patient not taking: Reported on 06/15/2020 04/12/20   Storm Frisk, MD  cyclobenzaprine (FLEXERIL) 10 MG tablet Take 10 mg by mouth daily as needed for muscle spasms.    [provider]  metFORMIN (GLUCOPHAGE) 500 MG tablet  Take 1 tablet (500 mg total) by mouth daily with breakfast. 04/12/20   Storm Frisk, MD  Multiple Vitamin (MULTIVITAMIN WITH MINERALS) TABS tablet Take 1 tablet by mouth daily.    [provider]  norgestimate-ethinyl estradiol (MILI) 0.25-35 MG-MCG tablet Take 1 tablet by mouth daily. Patient not taking: Reported on 06/15/2020 04/02/20   Alric Seton, MD  pantoprazole (PROTONIX) 20 MG tablet Take 1 tablet (20 mg total) by mouth daily. 05/16/20   Adam Phenix, MD  polyethylene glycol powder (GLYCOLAX/MIRALAX) 17 GM/SCOOP powder Take 17 g by mouth 2 (two) times daily as needed for mild constipation. 05/28/20   Glade Lloyd, MD  prazosin (MINIPRESS) 1 MG capsule Take 1 capsule (1 mg total) by mouth at  bedtime. Patient taking differently: Take 1 mg by mouth at bedtime as needed (nightmares). 12/06/19   Toy Cookey E, NP  psyllium (METAMUCIL) 58.6 % powder Take 1 packet by mouth daily as needed (low fiber).    [provider]  QUEtiapine (SEROQUEL) 100 MG tablet Take 1 tablet (100 mg total) by mouth at bedtime. 12/06/19   Shanna Cisco, NP  QUEtiapine (SEROQUEL) 25 MG tablet Take 1 tablet (25 mg total) by mouth 2 (two) times daily. 12/06/19   Shanna Cisco, NP  valACYclovir (VALTREX) 1000 MG tablet Take 1 tablet (1,000 mg total) by mouth daily. 05/21/20   Storm Frisk, MD    Family History Family History  Problem Relation Age of Onset   Bipolar disorder Mother    Diabetes Maternal Grandmother    Hypertension Neg Hx    Heart disease Neg Hx    Cancer Neg Hx     Social History Social History   Tobacco Use   Smoking status: Former    Packs/day: 0.25    Years: 15.00    Pack years: 3.75    Types: Cigarettes    Quit date: 05/02/2020    Years since quitting: 0.2   Smokeless tobacco: Never  Vaping Use   Vaping Use: Never used  Substance Use Topics   Alcohol use: No    Comment: occ   Drug use: No     Allergies   Patient has no known allergies.   Review of Systems Review of Systems Per HPI  Physical Exam Triage Vital Signs ED Triage Vitals  Enc Vitals Group     BP 07/16/20 1227 115/82     Pulse Rate 07/16/20 1226 80     Resp 07/16/20 1226 17     Temp 07/16/20 1226 98.9 F (37.2 C)     Temp Source 07/16/20 1226 Oral     SpO2 07/16/20 1226 99 %     Weight --      Height --      Head Circumference --      Peak Flow --      Pain Score 07/16/20 1223 8     Pain Loc --      Pain Edu? --      Excl. in GC? --    No data found.  Updated Vital Signs BP 115/82   Pulse 80   Temp 98.9 F (37.2 C) (Oral)   Resp 17   LMP 06/25/2020 (Approximate)   SpO2 99%   Visual Acuity Right Eye Distance:   Left Eye Distance:   Bilateral  Distance:    Right Eye Near:   Left Eye Near:    Bilateral Near:     Physical Exam Constitutional:  General: She is not in acute distress.    Appearance: Normal appearance.  HENT:     Head: Normocephalic and atraumatic.     Right Ear: Hearing, tympanic membrane and ear canal normal.     Left Ear: Hearing and ear canal normal. No drainage or swelling. A middle ear effusion is present. Tympanic membrane is erythematous. Tympanic membrane is not perforated or bulging.     Nose: Nose normal.     Mouth/Throat:     Mouth: Mucous membranes are moist.     Pharynx: No posterior oropharyngeal erythema.  Eyes:     Extraocular Movements: Extraocular movements intact.     Conjunctiva/sclera: Conjunctivae normal.  Cardiovascular:     Rate and Rhythm: Normal rate and regular rhythm.     Pulses: Normal pulses.     Heart sounds: Normal heart sounds.  Pulmonary:     Effort: Pulmonary effort is normal. No respiratory distress.     Breath sounds: Normal breath sounds.  Skin:    General: Skin is warm and dry.  Neurological:     General: No focal deficit present.     Mental Status: She is alert and oriented to person, place, and time. Mental status is at baseline.  Psychiatric:        Mood and Affect: Mood normal.        Behavior: Behavior normal.        Thought Content: Thought content normal.        Judgment: Judgment normal.     UC Treatments / Results  Labs (all labs ordered are listed, but only abnormal results are displayed) Labs Reviewed - No data to display  EKG   Radiology No results found.  Procedures Procedures (including critical care time)  Medications Ordered in UC Medications - No data to display  Initial Impression / Assessment and Plan / UC Course  I have reviewed the triage vital signs and the nursing notes.  Pertinent labs & imaging results that were available during my care of the patient were reviewed by me and considered in my medical decision making  (see chart for details).     Left tympanic membrane is erythematous on exam and also has fluid in in middle ear.  Will treat with amoxicillin x10 days along with cetirizine 10 mg daily.  Patient to follow-up with PCP if no improvement of symptoms. Discussed strict return precautions. Patient verbalized understanding and is agreeable with plan.  Final Clinical Impressions(s) / UC Diagnoses   Final diagnoses:  Left otitis media, unspecified otitis media type     Discharge Instructions      You are being treated for a left ear infection with amoxicillin antibiotic and cetirizine antihistamine.  Please follow-up with your primary care physician if symptoms do not resolve.  Please go to the hospital if symptoms worsen.     ED Prescriptions     Medication Sig Dispense Auth. Provider   amoxicillin (AMOXIL) 875 MG tablet Take 1 tablet (875 mg total) by mouth 2 (two) times daily for 10 days. 20 tablet Lance Muss, FNP   cetirizine (ZYRTEC) 10 MG tablet Take 1 tablet (10 mg total) by mouth daily. 15 tablet Lance Muss, FNP      PDMP not reviewed this encounter.   Lance Muss, FNP 07/16/20 1256

## 2020-07-16 NOTE — Discharge Instructions (Addendum)
You are being treated for a left ear infection with amoxicillin antibiotic and cetirizine antihistamine.  Please follow-up with your primary care physician if symptoms do not resolve.  Please go to the hospital if symptoms worsen.

## 2020-07-23 ENCOUNTER — Telehealth: Payer: Medicaid Other | Admitting: Critical Care Medicine

## 2020-07-23 ENCOUNTER — Other Ambulatory Visit: Payer: Self-pay

## 2020-07-23 ENCOUNTER — Telehealth: Payer: Self-pay | Admitting: Obstetrics & Gynecology

## 2020-07-23 MED ORDER — TIZANIDINE HCL 6 MG PO CAPS
6.0000 mg | ORAL_CAPSULE | Freq: Three times a day (TID) | ORAL | 1 refills | Status: DC | PRN
  Filled 2020-07-23 – 2021-05-10 (×4): qty 90, 30d supply, fill #0
  Filled 2021-06-17: qty 90, 30d supply, fill #1

## 2020-07-23 MED ORDER — MELOXICAM 15 MG PO TABS
15.0000 mg | ORAL_TABLET | Freq: Every day | ORAL | 0 refills | Status: DC
  Filled 2020-07-23: qty 30, 30d supply, fill #0

## 2020-07-23 NOTE — Telephone Encounter (Signed)
Patient need a Refill on her medication,  for her PCOS.

## 2020-07-23 NOTE — Telephone Encounter (Signed)
Returned patient's phone call. Pt states since calling front office she has contacted her pharmacy who explained that she does have refills available. Encouraged pt to follow up as needed.

## 2020-07-23 NOTE — Addendum Note (Signed)
Addended by: Shan Levans E on: 07/23/2020 05:06 PM   Modules accepted: Orders

## 2020-07-24 ENCOUNTER — Other Ambulatory Visit: Payer: Self-pay

## 2020-07-31 ENCOUNTER — Other Ambulatory Visit: Payer: Self-pay

## 2020-07-31 ENCOUNTER — Telehealth: Payer: Self-pay | Admitting: Obstetrics and Gynecology

## 2020-07-31 NOTE — Telephone Encounter (Signed)
Patient want to know if she can get some weight loss supplements,state that her weight is not coming off fast enough.

## 2020-08-01 NOTE — Telephone Encounter (Signed)
Pt concern addressed via [employer].    Sarah Evans  08/01/20

## 2020-08-04 ENCOUNTER — Encounter (HOSPITAL_COMMUNITY): Payer: Self-pay | Admitting: Emergency Medicine

## 2020-08-04 ENCOUNTER — Other Ambulatory Visit: Payer: Self-pay

## 2020-08-04 ENCOUNTER — Emergency Department (HOSPITAL_COMMUNITY)
Admission: EM | Admit: 2020-08-04 | Discharge: 2020-08-05 | Disposition: A | Discharge status: Home / Self Care | Payer: Medicaid Other | Attending: Emergency Medicine | Admitting: Emergency Medicine

## 2020-08-04 DIAGNOSIS — Z7984 Long term (current) use of oral hypoglycemic drugs: Secondary | ICD-10-CM | POA: Insufficient documentation

## 2020-08-04 DIAGNOSIS — R7303 Prediabetes: Secondary | ICD-10-CM | POA: Insufficient documentation

## 2020-08-04 DIAGNOSIS — H9202 Otalgia, left ear: Secondary | ICD-10-CM

## 2020-08-04 DIAGNOSIS — Z87891 Personal history of nicotine dependence: Secondary | ICD-10-CM | POA: Insufficient documentation

## 2020-08-04 DIAGNOSIS — H6122 Impacted cerumen, left ear: Secondary | ICD-10-CM | POA: Insufficient documentation

## 2020-08-04 DIAGNOSIS — M62838 Other muscle spasm: Secondary | ICD-10-CM | POA: Insufficient documentation

## 2020-08-04 NOTE — ED Triage Notes (Signed)
Patient is complaining of pain in left ear. Patient feels a something sitting on the nerve in her ear. Patient also states her left leg is having muscles spasms and she feels the muscle spasms has spread to her body. She states the muscle relaxer's that were given to her are not working.

## 2020-08-04 NOTE — ED Provider Notes (Signed)
[facility] Provider Note   CSN: [id number] Arrival date & time: 08/04/20  2217     History Chief Complaint  Patient presents with   Ear Pain   Spasms    Sarah Evans is a 40 y.o. female.  Patient presents to the emergency department with a chief complaint of left ear pain.  She states that she has had pain in her ear for the past few days.  She states that she is taking an antibiotic for this.  She has also been using eardrops.  Patient also complains of a muscle spasm in her right thigh.  She has tried taking a muscle relaxer without relief.  She denies any other associated symptoms.  The history is provided by the patient. No language interpreter was used.      Past Medical History:  Diagnosis Date   Bipolar 1 disorder (HCC)    Herpes simplex virus (HSV) infection    dx 2001   Polycystic ovary disease    Schizophrenia (HCC)    Stroke [facility])     Patient Active Problem List   Diagnosis Date Noted   GERD without esophagitis 05/27/2020   History of ischemic stroke 05/27/2020   Numbness and tingling 05/24/2020   Ischemic stroke (HCC) 05/23/2020   BMI 29.0-29.9,adult 05/21/2020   HSV (herpes simplex virus) anogenital infection 05/21/2020   Prediabetes 04/12/2020   Intrinsic eczema 04/12/2020   Mixed hyperlipidemia 04/12/2020   Muscle spasticity 04/12/2020   Moderate episode of recurrent major depressive disorder (HCC) 06/09/2019   Generalized anxiety disorder 06/09/2019   Bipolar disorder, current episode depressed, moderate (HCC) 04/28/2018   Personal history of nonsuicidal self-harm 04/28/2018   PTSD (post-traumatic stress disorder) 04/30/2015   Schizoaffective disorder, bipolar type (HCC) 04/27/2015    Past Surgical History:  Procedure Laterality Date   BUBBLE STUDY  05/25/2020   Procedure: BUBBLE STUDY;  Surgeon: Chrystie Nose, MD;  Location: [facility] ENDOSCOPY;  Service: Cardiovascular;;   NO PAST SURGERIES     TEE WITHOUT  CARDIOVERSION N/A 05/25/2020   Procedure: TRANSESOPHAGEAL ECHOCARDIOGRAM (TEE);  Surgeon: Chrystie Nose, MD;  Location: [facility] ENDOSCOPY;  Service: Cardiovascular;  Laterality: N/A;     OB History     Gravida  1   Para  1   Term  0   Preterm  1   AB  0   Living  0      SAB  0   IAB  0   Ectopic  0   Multiple  0   Live Births  1           Family History  Problem Relation Age of Onset   Bipolar disorder Mother    Diabetes Maternal Grandmother    Hypertension Neg Hx    Heart disease Neg Hx    Cancer Neg Hx     Social History   Tobacco Use   Smoking status: Former    Packs/day: 0.25    Years: 15.00    Pack years: 3.75    Types: Cigarettes    Quit date: 05/02/2020    Years since quitting: 0.2   Smokeless tobacco: Never  Vaping Use   Vaping Use: Never used  Substance Use Topics   Alcohol use: No    Comment: occ   Drug use: No    Home Medications Prior to Admission medications   Medication Sig Start Date End Date Taking? Authorizing Provider  atorvastatin (LIPITOR) 10 MG tablet Take 1  tablet (10 mg total) by mouth daily. 04/12/20  Yes Storm Frisk, MD  cholecalciferol (VITAMIN D3) 25 MCG (1000 UNIT) tablet Take 2,000 Units by mouth daily.   Yes [provider]  metFORMIN (GLUCOPHAGE) 500 MG tablet Take 1 tablet (500 mg total) by mouth daily with breakfast. 04/12/20  Yes Storm Frisk, MD  Multiple Vitamin (MULTIVITAMIN WITH MINERALS) TABS tablet Take 1 tablet by mouth daily.   Yes [provider]  norgestimate-ethinyl estradiol (MILI) 0.25-35 MG-MCG tablet Take 1 tablet by mouth daily. 04/02/20  Yes Alric Seton, MD  QUEtiapine (SEROQUEL) 100 MG tablet Take 1 tablet (100 mg total) by mouth at bedtime. 12/06/19  Yes Toy Cookey E, NP  tiZANidine (ZANAFLEX) 4 MG capsule Take 4 mg by mouth 3 (three) times daily.   Yes [provider]  valACYclovir (VALTREX) 1000 MG tablet Take 1 tablet (1,000 mg total) by mouth  daily. 05/21/20  Yes Storm Frisk, MD  busPIRone (BUSPAR) 15 MG tablet Take 1 tablet (15 mg total) by mouth 3 (three) times daily. 12/06/19   Shanna Cisco, NP  cetirizine (ZYRTEC) 10 MG tablet Take 1 tablet (10 mg total) by mouth daily. Patient not taking: Reported on 08/04/2020 07/16/20   Lance Muss, FNP  clobetasol cream (TEMOVATE) 0.05 % Apply 1 application topically 2 (two) times daily. 04/12/20   Storm Frisk, MD  meloxicam (MOBIC) 15 MG tablet Take 1 tablet (15 mg total) by mouth daily. Patient not taking: No sig reported 07/23/20   Storm Frisk, MD  pantoprazole (PROTONIX) 20 MG tablet Take 1 tablet (20 mg total) by mouth daily. 05/16/20   Adam Phenix, MD  polyethylene glycol powder (GLYCOLAX/MIRALAX) 17 GM/SCOOP powder Take 17 g by mouth 2 (two) times daily as needed for mild constipation. Patient not taking: Reported on 08/04/2020 05/28/20   Glade Lloyd, MD  prazosin (MINIPRESS) 1 MG capsule Take 1 capsule (1 mg total) by mouth at bedtime. Patient not taking: No sig reported 12/06/19   Toy Cookey E, NP  psyllium (METAMUCIL) 58.6 % powder Take 1 packet by mouth daily as needed (low fiber). Patient not taking: Reported on 08/04/2020    [provider]  QUEtiapine (SEROQUEL) 25 MG tablet Take 1 tablet (25 mg total) by mouth 2 (two) times daily. Patient not taking: Reported on 08/04/2020 12/06/19   Shanna Cisco, NP  tizanidine (ZANAFLEX) 6 MG capsule Take 1 capsule (6 mg total) by mouth 3 (three) times daily as needed for muscle spasms. 07/23/20   Storm Frisk, MD    Allergies    Patient has no known allergies.  Review of Systems   Review of Systems  All other systems reviewed and are negative.  Physical Exam Updated Vital Signs BP 121/80 (BP Location: Left Arm)   Pulse 85   Temp 98.3 F (36.8 C) (Oral)   Resp 16   Ht 5\' 7"  (1.702 m)   Wt 80.3 kg   LMP 08/03/2020   SpO2 100%   BMI 27.72 kg/m   Physical Exam Vitals and  nursing note reviewed.  Constitutional:      General: She is not in acute distress.    Appearance: She is well-developed.  HENT:     Head: Normocephalic and atraumatic.     Ears:     Comments: Left-sided cerumen impaction Right TM is clear Eyes:     Conjunctiva/sclera: Conjunctivae normal.  Cardiovascular:     Rate and Rhythm: Normal  rate.     Heart sounds: No murmur heard. Pulmonary:     Effort: Pulmonary effort is normal. No respiratory distress.  Abdominal:     General: There is no distension.  Musculoskeletal:     Cervical back: Neck supple.     Comments: Spasming of right quadriceps  Skin:    General: Skin is warm and dry.  Neurological:     Mental Status: She is alert and oriented to person, place, and time.  Psychiatric:        Mood and Affect: Mood normal.        Behavior: Behavior normal.    ED Results / Procedures / Treatments   Labs (all labs ordered are listed, but only abnormal results are displayed) Labs Reviewed  BASIC METABOLIC PANEL  MAGNESIUM    EKG None  Radiology No results found.  Procedures Procedures   Medications Ordered in ED Medications - No data to display  ED Course  I have reviewed the triage vital signs and the nursing notes.  Pertinent labs & imaging results that were available during my care of the patient were reviewed by me and considered in my medical decision making (see chart for details).    MDM Rules/Calculators/A&P                           Patient here with left-sided ear pain.  She still has some residual erythema, given her symptoms, will extend course of antibiotics.  She is noted to have slightly low potassium, question whether this is contributing to her muscle spasm.  Will give potassium supplement here.  Also has slightly low calcium, though I doubt her symptoms are attributed to this. Recommend PCP follow-up.  Vital signs are stable.  She is in no acute distress.   Final Clinical Impression(s) / ED  Diagnoses Final diagnoses:  Left ear pain  Muscle spasm    Rx / DC Orders ED Discharge Orders     None        Roxy Horseman, PA-C 08/05/20 0121    Palumbo, April, MD 08/05/20 2725

## 2020-08-05 LAB — BASIC METABOLIC PANEL
Anion gap: 9 (ref 5–15)
BUN: 14 mg/dL (ref 6–20)
CO2: 23 mmol/L (ref 22–32)
Calcium: 8.7 mg/dL — ABNORMAL LOW (ref 8.9–10.3)
Chloride: 105 mmol/L (ref 98–111)
Creatinine, Ser: 1.23 mg/dL — ABNORMAL HIGH (ref 0.44–1.00)
GFR, Estimated: 58 mL/min — ABNORMAL LOW (ref >60)
Glucose, Bld: 113 mg/dL — ABNORMAL HIGH (ref 70–99)
Potassium: 3.5 mmol/L (ref 3.5–5.1)
Sodium: 137 mmol/L (ref 135–145)

## 2020-08-05 LAB — MAGNESIUM: Magnesium: 2 mg/dL (ref 1.7–2.4)

## 2020-08-05 MED ORDER — CEFDINIR 300 MG PO CAPS
300.0000 mg | ORAL_CAPSULE | Freq: Two times a day (BID) | ORAL | 0 refills | Status: DC
  Filled 2020-08-27: qty 14, 7d supply, fill #0

## 2020-08-05 MED ORDER — POTASSIUM CHLORIDE CRYS ER 20 MEQ PO TBCR
40.0000 meq | EXTENDED_RELEASE_TABLET | Freq: Once | ORAL | Status: AC
  Administered 2020-08-05: 40 meq via ORAL
  Filled 2020-08-05: qty 2

## 2020-08-05 NOTE — Discharge Instructions (Addendum)
Your potassium level was found to be slightly low, this could contribute to your muscle spasms.  Please eat more dark green leafy vegetables.  You have been given potassium supplement while in the emergency department.  Please take the antibiotic as directed for ear pain.  If you do not improve, please follow-up with the ear nose and throat specialist listed.

## 2020-08-06 ENCOUNTER — Other Ambulatory Visit: Payer: Self-pay

## 2020-08-15 ENCOUNTER — Inpatient Hospital Stay: Payer: Medicaid Other | Admitting: Neurology

## 2020-08-20 ENCOUNTER — Inpatient Hospital Stay: Payer: Medicaid Other | Admitting: Critical Care Medicine

## 2020-08-27 ENCOUNTER — Other Ambulatory Visit: Payer: Self-pay

## 2020-08-27 ENCOUNTER — Ambulatory Visit: Payer: Medicaid Other | Admitting: Obstetrics and Gynecology

## 2020-09-03 ENCOUNTER — Ambulatory Visit: Payer: Medicaid Other | Admitting: Obstetrics and Gynecology

## 2020-09-03 ENCOUNTER — Encounter: Payer: Self-pay | Admitting: Obstetrics and Gynecology

## 2020-09-03 ENCOUNTER — Other Ambulatory Visit: Payer: Self-pay

## 2020-09-03 NOTE — Progress Notes (Signed)
Patient did not keep her GYN appointment for 09/03/2020.  Tiffney Haughton, Jr MD Attending [facility] (Faculty Practice)  

## 2020-09-08 ENCOUNTER — Other Ambulatory Visit: Payer: Self-pay

## 2020-09-08 ENCOUNTER — Emergency Department (HOSPITAL_COMMUNITY)
Admission: EM | Admit: 2020-09-08 | Discharge: 2020-09-08 | Disposition: A | Discharge status: Home / Self Care | Payer: Medicaid Other | Attending: Emergency Medicine | Admitting: Emergency Medicine

## 2020-09-08 DIAGNOSIS — R202 Paresthesia of skin: Secondary | ICD-10-CM | POA: Insufficient documentation

## 2020-09-08 DIAGNOSIS — Z87891 Personal history of nicotine dependence: Secondary | ICD-10-CM | POA: Insufficient documentation

## 2020-09-08 LAB — I-STAT BETA HCG BLOOD, ED (MC, WL, AP ONLY): I-stat hCG, quantitative: <5 m[IU]/mL (ref <5)

## 2020-09-08 LAB — URINALYSIS, ROUTINE W REFLEX MICROSCOPIC
Bilirubin Urine: NEGATIVE
Glucose, UA: NEGATIVE mg/dL
Hgb urine dipstick: NEGATIVE
Ketones, ur: NEGATIVE mg/dL
Leukocytes,Ua: NEGATIVE
Nitrite: NEGATIVE
Protein, ur: NEGATIVE mg/dL
Specific Gravity, Urine: >1.03 — ABNORMAL HIGH (ref 1.005–1.030)
pH: 6 (ref 5.0–8.0)

## 2020-09-08 LAB — BASIC METABOLIC PANEL
Anion gap: 10 (ref 5–15)
BUN: 22 mg/dL — ABNORMAL HIGH (ref 6–20)
CO2: 24 mmol/L (ref 22–32)
Calcium: 9.3 mg/dL (ref 8.9–10.3)
Chloride: 104 mmol/L (ref 98–111)
Creatinine, Ser: 1.13 mg/dL — ABNORMAL HIGH (ref 0.44–1.00)
GFR, Estimated: >60 mL/min (ref >60)
Glucose, Bld: 94 mg/dL (ref 70–99)
Potassium: 3.7 mmol/L (ref 3.5–5.1)
Sodium: 138 mmol/L (ref 135–145)

## 2020-09-08 LAB — CBC
HCT: 39.1 % (ref 36.0–46.0)
Hemoglobin: 12.4 g/dL (ref 12.0–15.0)
MCH: 28.1 pg (ref 26.0–34.0)
MCHC: 31.7 g/dL (ref 30.0–36.0)
MCV: 88.7 fL (ref 80.0–100.0)
Platelets: 343 10*3/uL (ref 150–400)
RBC: 4.41 MIL/uL (ref 3.87–5.11)
RDW: 13.9 % (ref 11.5–15.5)
WBC: 8.5 10*3/uL (ref 4.0–10.5)
nRBC: 0 % (ref 0.0–0.2)

## 2020-09-08 LAB — CBG MONITORING, ED: Glucose-Capillary: 101 mg/dL — ABNORMAL HIGH (ref 70–99)

## 2020-09-08 NOTE — ED Provider Notes (Signed)
[facility]-EMERGENCY DEPT Provider Note: Lowella Dell, MD, FACEP  CSN: [id number] MRN: [id number] ARRIVAL: 09/08/20 at 0004 ROOM: WA09/WA09   CHIEF COMPLAINT  Numbness   HISTORY OF PRESENT ILLNESS  09/08/20 3:18 AM Sarah Evans is a 40 y.o. female who had a right-sided stroke order about 05/16/2020 leaving her with difficulty making 5 movements with her left hand.   She woke up yesterday morning with paresthesias (a sensation of needles pricking her) in her right second toe.  Over the next several hours these pricking sensations generalized to involve her entire body.  Symptoms are moderate (6 out of 10) and she states they are still present.  Nothing makes them better or worse.  She has no associated motor changes.    Past Medical History:  Diagnosis Date   Bipolar 1 disorder (HCC)    Herpes simplex virus (HSV) infection    dx 2001   Polycystic ovary disease    Schizophrenia (HCC)    Stroke [facility])     Past Surgical History:  Procedure Laterality Date   BUBBLE STUDY  05/25/2020   Procedure: BUBBLE STUDY;  Surgeon: Chrystie Nose, MD;  Location: [facility] ENDOSCOPY;  Service: Cardiovascular;;   NO PAST SURGERIES     TEE WITHOUT CARDIOVERSION N/A 05/25/2020   Procedure: TRANSESOPHAGEAL ECHOCARDIOGRAM (TEE);  Surgeon: Chrystie Nose, MD;  Location: [facility] ENDOSCOPY;  Service: Cardiovascular;  Laterality: N/A;    Family History  Problem Relation Age of Onset   Bipolar disorder Mother    Diabetes Maternal Grandmother    Hypertension Neg Hx    Heart disease Neg Hx    Cancer Neg Hx     Social History   Tobacco Use   Smoking status: Former    Packs/day: 0.25    Years: 15.00    Pack years: 3.75    Types: Cigarettes    Quit date: 05/02/2020    Years since quitting: 0.3   Smokeless tobacco: Never  Vaping Use   Vaping Use: Never used  Substance Use Topics   Alcohol use: No    Comment: occ   Drug use: No    Prior to Admission medications   Medication Sig Start Date End Date  Taking? Authorizing Provider  atorvastatin (LIPITOR) 10 MG tablet Take 1 tablet (10 mg total) by mouth daily. 04/12/20   Storm Frisk, MD  busPIRone (BUSPAR) 15 MG tablet Take 1 tablet (15 mg total) by mouth 3 (three) times daily. 12/06/19   Shanna Cisco, NP  cefdinir (OMNICEF) 300 MG capsule Take 1 capsule (300 mg total) by mouth 2 (two) times daily. 08/05/20   Roxy Horseman, PA-C  cholecalciferol (VITAMIN D3) 25 MCG (1000 UNIT) tablet Take 2,000 Units by mouth daily.    [provider]  clobetasol cream (TEMOVATE) 0.05 % Apply 1 application topically 2 (two) times daily. 04/12/20   Storm Frisk, MD  metFORMIN (GLUCOPHAGE) 500 MG tablet Take 1 tablet (500 mg total) by mouth daily with breakfast. 04/12/20   Storm Frisk, MD  Multiple Vitamin (MULTIVITAMIN WITH MINERALS) TABS tablet Take 1 tablet by mouth daily.    [provider]  norgestimate-ethinyl estradiol (MILI) 0.25-35 MG-MCG tablet Take 1 tablet by mouth daily. 04/02/20   Alric Seton, MD  pantoprazole (PROTONIX) 20 MG tablet Take 1 tablet (20 mg total) by mouth daily. 05/16/20   Adam Phenix, MD  QUEtiapine (SEROQUEL) 100 MG tablet Take 1 tablet (100 mg total) by mouth at bedtime. 12/06/19  Toy Cookey E, NP  tiZANidine (ZANAFLEX) 4 MG capsule Take 4 mg by mouth 3 (three) times daily.    [provider]  tizanidine (ZANAFLEX) 6 MG capsule Take 1 capsule (6 mg total) by mouth 3 (three) times daily as needed for muscle spasms. 07/23/20   Storm Frisk, MD  valACYclovir (VALTREX) 1000 MG tablet Take 1 tablet (1,000 mg total) by mouth daily. 05/21/20   Storm Frisk, MD    Allergies Patient has no known allergies.   REVIEW OF SYSTEMS  Negative except as noted here or in the History of Present Illness.   PHYSICAL EXAMINATION  Initial Vital Signs Blood pressure 135/85, pulse 77, temperature 98.2 F (36.8 C), temperature source Oral, resp. rate 14, height 5\' 7"  (1.702 m),  weight 78.9 kg, SpO2 100 %.  Examination General: Well-developed, well-nourished female in no acute distress; appearance consistent with age of record HENT: normocephalic; atraumatic Eyes: pupils equal, round and reactive to light; extraocular muscles intact Neck: supple Heart: regular rate and rhythm Lungs: clear to auscultation bilaterally Abdomen: soft; nondistended; nontender; bowel sounds present Extremities: No deformity; full range of motion; pulses normal Neurologic: Awake, alert and oriented; motor function intact in all extremities and symmetric with the exception of difficulty making fine movements of her left hand; sensation equal bilaterally face, trunk and extremities; no facial droop; normal finger-nose; no pronator drift Skin: Warm and dry Psychiatric: Normal mood and affect   RESULTS  Summary of this visit's results, reviewed and interpreted by myself:   EKG Interpretation  Date/Time:  Saturday September 08 2020 01:14:52 EDT Ventricular Rate:  86 PR Interval:  134 QRS Duration: 82 QT Interval:  363 QTC Calculation: 435 R Axis:   57 Text Interpretation: Sinus rhythm No significant change was found Confirmed by Kyerra Vargo, [birth year]2000 (Jonny Ruiz) on 09/08/2020 2:00:24 AM       Laboratory Studies: Results for orders placed or performed during the hospital encounter of 09/08/20 (from the past 24 hour(s))  CBG monitoring, ED     Status: Abnormal   Collection Time: 09/08/20 12:59 AM  Result Value Ref Range   Glucose-Capillary 101 (H) 70 - 99 mg/dL  Basic metabolic panel     Status: Abnormal   Collection Time: 09/08/20  1:00 AM  Result Value Ref Range   Sodium 138 135 - 145 mmol/L   Potassium 3.7 3.5 - 5.1 mmol/L   Chloride 104 98 - 111 mmol/L   CO2 24 22 - 32 mmol/L   Glucose, Bld 94 70 - 99 mg/dL   BUN 22 (H) 6 - 20 mg/dL   Creatinine, Ser 11/08/20 (H) 0.44 - 1.00 mg/dL   Calcium 9.3 8.9 - 6.76 mg/dL   GFR, Estimated 19.5 >09 mL/min   Anion gap 10 5 - 15  CBC     Status:  None   Collection Time: 09/08/20  1:00 AM  Result Value Ref Range   WBC 8.5 4.0 - 10.5 K/uL   RBC 4.41 3.87 - 5.11 MIL/uL   Hemoglobin 12.4 12.0 - 15.0 g/dL   HCT 11/08/20 67.1 - 24.5 %   MCV 88.7 80.0 - 100.0 fL   MCH 28.1 26.0 - 34.0 pg   MCHC 31.7 30.0 - 36.0 g/dL   RDW 80.9 98.3 - 38.2 %   Platelets 343 150 - 400 K/uL   nRBC 0.0 0.0 - 0.2 %  Urinalysis, Routine w reflex microscopic Urine, Clean Catch     Status: Abnormal   Collection Time: 09/08/20  1:00 AM  Result Value Ref Range   Color, Urine YELLOW YELLOW   APPearance CLEAR CLEAR   Specific Gravity, Urine >1.030 (H) 1.005 - 1.030   pH 6.0 5.0 - 8.0   Glucose, UA NEGATIVE NEGATIVE mg/dL   Hgb urine dipstick NEGATIVE NEGATIVE   Bilirubin Urine NEGATIVE NEGATIVE   Ketones, ur NEGATIVE NEGATIVE mg/dL   Protein, ur NEGATIVE NEGATIVE mg/dL   Nitrite NEGATIVE NEGATIVE   Leukocytes,Ua NEGATIVE NEGATIVE  I-Stat beta hCG blood, ED     Status: None   Collection Time: 09/08/20  1:32 AM  Result Value Ref Range   I-stat hCG, quantitative <5.0 <5 mIU/mL   Comment 3           Imaging Studies: No results found.  ED COURSE and MDM  Nursing notes, initial and subsequent vitals signs, including pulse oximetry, reviewed and interpreted by myself.  Vitals:   09/08/20 0023 09/08/20 0025 09/08/20 0315  BP: 135/85  (!) 144/89  Pulse: 77  76  Resp: 14  16  Temp: 98.2 F (36.8 C)    TempSrc: Oral    SpO2: 100%  100%  Weight:  78.9 kg   Height:  5\' 7"  (1.702 m)    Medications - No data to display  Neuro exam normal except for known difficulty moving the left hand.  No new motor deficits seen  Discussed with Dr.Khaliqdina of the neurohospitalists.  Since symptoms are bilateral and her positive symptoms, whereas a stroke would only be expected to cause negative symptoms such as loss of strength or loss of sensation, he feels the patient is safe for discharge and follow-up with her neurologist as an outpatient.  PROCEDURES   Procedures   ED DIAGNOSES     ICD-10-CM   1. Paresthesias  R20.2          , MD 09/08/20 [phone]

## 2020-09-08 NOTE — Progress Notes (Signed)
Brief Telephone consult with Dr. 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 She has no objective new sensory deficit and exam is consistent with her prior deficit. She describes these as paresthesias.  CTH w/o contrast is negative.  Recs: - unlikely for stroke to be bilateral, stroke is also usually not a positive symptom. - She had recent MRI Brain from 05/27/20 which did not show any T2/FLAIR lesions concerning for demyelination. Okay to discharge home with follow up with outpatient neurologist.  Erick Blinks [facility] Pager Number [phone]

## 2020-09-08 NOTE — ED Notes (Signed)
Pt provided w/labeled specimen cup for urine collection per MD order. ENMiles 

## 2020-09-08 NOTE — ED Notes (Signed)
Patient ambulatory to room with steady gait.

## 2020-09-08 NOTE — Discharge Instructions (Addendum)
Please make an appointment to see your neurologist.

## 2020-09-08 NOTE — ED Triage Notes (Signed)
Pt came in with c/o paresthesia. States that it is all over but mostly in her feet and R great toe. Pt states that it started this morning. Pt is pre diabetic and has hx of stroke

## 2020-09-11 ENCOUNTER — Other Ambulatory Visit: Payer: Self-pay | Admitting: Critical Care Medicine

## 2020-09-11 ENCOUNTER — Other Ambulatory Visit: Payer: Self-pay | Admitting: Emergency Medicine

## 2020-09-11 NOTE — Telephone Encounter (Signed)
Copied from CRM [id number]. Topic: Quick Communication - Rx Refill/Question >> Sep 11, 2020  3:14 PM Jaquita Rector A wrote: Medication: metFORMIN (GLUCOPHAGE) 500 MG tablet  Has the patient contacted their pharmacy? Yes.   (Agent: If no, request that the patient contact the pharmacy for the refill.) (Agent: If yes, when and what did the pharmacy advise?)  Preferred Pharmacy (with phone number or street name): [id number] - [place], [place] - [address]  Phone:  [phone] Fax:  [phone]     Agent: Please be advised that RX refills may take up to 3 business days. We ask that you follow-up with your pharmacy.

## 2020-09-12 MED ORDER — METFORMIN HCL 500 MG PO TABS: 500.0000 mg | ORAL_TABLET | Freq: Every day | ORAL | 3 refills | Status: DC

## 2020-09-12 NOTE — Telephone Encounter (Signed)
Requested medication (s) are due for refill today: yes  Requested medication (s) are on the active medication list: yes  Last refill:  04/12/20 #60 3 refills  Future visit scheduled: no  Notes to clinic:  [name]     Requested Prescriptions  Pending Prescriptions Disp Refills   metFORMIN (GLUCOPHAGE) 500 MG tablet 60 tablet 3    Sig: Take 1 tablet (500 mg total) by mouth daily with breakfast.     Endocrinology:  Diabetes - Biguanides Failed - 09/12/2020  4:45 AM      Failed - Cr in normal range and within 360 days    Creatinine, Ser  Date Value Ref Range Status  09/08/2020 1.13 (H) 0.44 - 1.00 mg/dL Final          Failed - AA eGFR in normal range and within 360 days    GFR calc Af Amer  Date Value Ref Range Status  10/26/2018 80 >59 mL/min/1.73 Final   GFR, Estimated  Date Value Ref Range Status  09/08/2020 >60 >60 mL/min Final    Comment:    (NOTE) Calculated using the CKD-EPI Creatinine Equation (2021)    eGFR  Date Value Ref Range Status  05/21/2020 68 >59 mL/min/1.73 Final          Passed - HBA1C is between 0 and 7.9 and within 180 days    Hgb A1c MFr Bld  Date Value Ref Range Status  06/15/2020 5.7 (H) 4.8 - 5.6 % Final    Comment:    (NOTE)         Prediabetes: 5.7 - 6.4         Diabetes: >6.4         Glycemic control for adults with diabetes: <7.0           Passed - Valid encounter within last 6 months    Recent Outpatient Visits           3 months ago Hyperlipidemia associated with type 2 diabetes mellitus ([name])   [place] Elsie Stain, MD   5 months ago Muscle spasticity   [place] Elsie Stain, MD   1 year ago Ear pain, bilateral   [place], [name], NP   2 years ago Encounter to establish care   [place], [name], NP       Future Appointments             In 1 week Garvin Fila, MD [facility]

## 2020-09-13 ENCOUNTER — Other Ambulatory Visit: Payer: Self-pay

## 2020-09-13 ENCOUNTER — Other Ambulatory Visit (HOSPITAL_COMMUNITY): Payer: Self-pay | Admitting: Psychiatry

## 2020-09-13 DIAGNOSIS — F250 Schizoaffective disorder, bipolar type: Secondary | ICD-10-CM

## 2020-09-19 ENCOUNTER — Other Ambulatory Visit: Payer: Self-pay

## 2020-09-19 MED ORDER — CEFDINIR 300 MG PO CAPS
300.0000 mg | ORAL_CAPSULE | Freq: Two times a day (BID) | ORAL | 0 refills | Status: DC
  Filled 2020-09-19 – 2020-10-15 (×2): qty 14, 7d supply, fill #0

## 2020-09-21 ENCOUNTER — Telehealth (INDEPENDENT_AMBULATORY_CARE_PROVIDER_SITE_OTHER): Payer: No Payment, Other | Admitting: Psychiatry

## 2020-09-21 ENCOUNTER — Other Ambulatory Visit: Payer: Self-pay

## 2020-09-21 ENCOUNTER — Encounter (HOSPITAL_COMMUNITY): Payer: Self-pay | Admitting: Psychiatry

## 2020-09-21 DIAGNOSIS — F502 Bulimia nervosa: Secondary | ICD-10-CM | POA: Diagnosis not present

## 2020-09-21 DIAGNOSIS — F4310 Post-traumatic stress disorder, unspecified: Secondary | ICD-10-CM

## 2020-09-21 DIAGNOSIS — F250 Schizoaffective disorder, bipolar type: Secondary | ICD-10-CM | POA: Diagnosis not present

## 2020-09-21 MED ORDER — PRAZOSIN HCL 1 MG PO CAPS
1.0000 mg | ORAL_CAPSULE | Freq: Every day | ORAL | 3 refills | Status: DC
  Filled 2020-09-21: qty 30, 30d supply, fill #0

## 2020-09-21 MED ORDER — LAMOTRIGINE 25 MG PO TABS
25.0000 mg | ORAL_TABLET | Freq: Every day | ORAL | 2 refills | Status: DC
  Filled 2020-09-21 – 2020-10-15 (×2): qty 30, 30d supply, fill #0

## 2020-09-21 MED ORDER — QUETIAPINE FUMARATE ER 150 MG PO TB24
150.0000 mg | ORAL_TABLET | Freq: Every day | ORAL | 3 refills | Status: DC
  Filled 2020-09-21: qty 30, 30d supply, fill #0

## 2020-09-21 MED ORDER — FLUOXETINE HCL 10 MG PO CAPS
10.0000 mg | ORAL_CAPSULE | Freq: Every day | ORAL | 3 refills | Status: DC
  Filled 2020-09-21 – 2020-10-15 (×2): qty 30, 30d supply, fill #0

## 2020-09-21 NOTE — Progress Notes (Signed)
[facility] MD/PA/NP OP Progress Note Virtual Visit via Video Note  I connected with Sarah Evans on 09/21/20 at  9:30 AM EDT by a video enabled telemedicine application and verified that I am speaking with the correct person using two identifiers.  Location: Patient: Home Provider: Clinic   I discussed the limitations of evaluation and management by telemedicine and the availability of in person appointments. The patient expressed understanding and agreed to proceed.  I provided 30 minutes of non-face-to-face time during this encounter.         09/21/2020 10:25 AM Sarah Evans  MRN:  [id number]  Chief Complaint: "I am out of my night time Seroquel and my nightmare and bipolar disorder have gotten worse"   HPI: 40 year old female seen today for for follow up psychiatric evaluation today.  She has a psychiatric history of Schizoaffective disorder, Bipolar 1, Depression, Anxiety, and PTSD. She is currently being managed on  Zoloft 50 daily, Buspar 15 mg TID, prazosin 1 mg, hydroxyzine 25 mg, and Seroquel 100 mg.  She notes that she discontinued Zoloft, Buspar, and Prazosin. Today she notes her medications are somewhat effective in managing her psychiatric conditions.   Today she is well groomed,  pleasant, cooperative, engaged in conversation, and maintained eye contact.  She informed provider that she ran out of her Seroquel last night however notes that she has been having symptoms of hypomania. She notes that she has been irritable, having racing thoughts, fluctuations in mood, and impulsive spending. She informed Clinical research associate that she becomes very angry with her coworker and boss. She reports that she tries to internalize her rage but notes that it does not work all the times. She informed Clinical research associate that most days she continues to be anxious and depressed.   Provider conducted a PHQ-9 and patient scored a 24, at her last visit she scored a 13. Provider also conducted a GAD 7 and patient scored a 16. She  notes she worries about her finances, her job, and her mental health. She reports that she will be going to her disability hearing soon and notes that she is hopeful it will be approved. Today  she denies SI/HI/AH or paranoia.  She does endorse VH noting that at times she sees shadows. She notes that her sleep is disturbed and reports feeling something/someone touch her at night and notes that she has nightly nightmares which causes her to wake up and tremor. She reports sleeping 6-12 hours nightly.  Patient informed Clinical research associate that she has an "eating addiction". She endorses binging, purging, and participating in restrictive eatings. She notes that she exercises frequently to lose weight. Since May she notes that she has lost 25 pounds. She notes that apart of her desire to lose weight is because she is prediabetic however notes that her eating habits are becoming problematic. She requested that provider give her something to help lose weight. Provider recommended that she not lose weight and instructed her to follow up with her PCP. Patient told about [employer] [facility] as she reports she needs assistance paying for her medications.   Today she will not restart Zoloft as she has discontinued it. She will start Prozac 10 mg to help manage anxiety, depression, and eating condition. She will restart Prazosin to help manage nightmares. She will start Lamictal 25 mg for two weeks and then increase to 50 mg. She will takes Seroquel 150 nightly as she notes her 25 mg of Seroquel is to sedating in the day time.  Potential side effects of medication and risks vs benefits of treatment vs non-treatment were explained and discussed. All questions were answered.  No other concerns noted at this time.   Visit Diagnosis:    ICD-10-CM   1. PTSD (post-traumatic stress disorder)  F43.10 prazosin (MINIPRESS) 1 MG capsule    2. Schizoaffective disorder, bipolar type (HCC)  F25.0 FLUoxetine (PROZAC) 10 MG capsule     lamoTRIgine (LAMICTAL) 25 MG tablet    QUEtiapine Fumarate 150 MG TABS    3. Bulimia nervosa  F50.2 FLUoxetine (PROZAC) 10 MG capsule      Past Psychiatric History: Schizoaffective disorder, Bipolar 1, Depression, Anxiety, and PTSD  Past Medical History:  Past Medical History:  Diagnosis Date   Bipolar 1 disorder (HCC)    Herpes simplex virus (HSV) infection    dx 2001   Polycystic ovary disease    Schizophrenia (HCC)    Stroke [facility])     Past Surgical History:  Procedure Laterality Date   BUBBLE STUDY  05/25/2020   Procedure: BUBBLE STUDY;  Surgeon: Chrystie Nose, MD;  Location: [facility] ENDOSCOPY;  Service: Cardiovascular;;   NO PAST SURGERIES     TEE WITHOUT CARDIOVERSION N/A 05/25/2020   Procedure: TRANSESOPHAGEAL ECHOCARDIOGRAM (TEE);  Surgeon: Chrystie Nose, MD;  Location: [facility] ENDOSCOPY;  Service: Cardiovascular;  Laterality: N/A;    Family Psychiatric History: Bipolar, Schizophrenia, and Depressed. Father Bipolar disorder.   Family History:  Family History  Problem Relation Age of Onset   Bipolar disorder Mother    Diabetes Maternal Grandmother    Hypertension Neg Hx    Heart disease Neg Hx    Cancer Neg Hx     Social History:  Social History   Socioeconomic History   Marital status: Single    Spouse name: Not on file   Number of children: Not on file   Years of education: Not on file   Highest education level: Not on file  Occupational History   Not on file  Tobacco Use   Smoking status: Former    Packs/day: 0.25    Years: 15.00    Pack years: 3.75    Types: Cigarettes    Quit date: 05/02/2020    Years since quitting: 0.3   Smokeless tobacco: Never  Vaping Use   Vaping Use: Never used  Substance and Sexual Activity   Alcohol use: No    Comment: occ   Drug use: No   Sexual activity: Not Currently    Birth control/protection: Abstinence  Other Topics Concern   Not on file  Social History Narrative   Not on file   Social Determinants of Health    Financial Resource Strain: Not on file  Food Insecurity: Not on file  Transportation Needs: Not on file  Physical Activity: Not on file  Stress: Not on file  Social Connections: Not on file    Allergies: No Known Allergies  Metabolic Disorder Labs: Lab Results  Component Value Date   HGBA1C 5.7 (H) 06/15/2020   MPG 117 06/15/2020   MPG 128.37 05/24/2020   Lab Results  Component Value Date   PROLACTIN 13.0 04/28/2015   Lab Results  Component Value Date   CHOL 143 05/24/2020   TRIG 142 05/24/2020   HDL 52 05/24/2020   CHOLHDL 2.8 05/24/2020   VLDL 28 05/24/2020   LDLCALC 63 05/24/2020   LDLCALC 78 05/21/2020   Lab Results  Component Value Date   TSH 2.110 04/02/2020   TSH 2.040 10/26/2018  Therapeutic Level Labs: No results found for: LITHIUM No results found for: VALPROATE No components found for:  CBMZ  Current Medications: Current Outpatient Medications  Medication Sig Dispense Refill   FLUoxetine (PROZAC) 10 MG capsule Take 1 capsule (10 mg total) by mouth daily. 30 capsule 3   lamoTRIgine (LAMICTAL) 25 MG tablet Take 1 tablet (25 mg total) by mouth daily. 30 tablet 2   prazosin (MINIPRESS) 1 MG capsule Take 1 capsule (1 mg total) by mouth at bedtime. 30 capsule 3   atorvastatin (LIPITOR) 10 MG tablet Take 1 tablet (10 mg total) by mouth daily. 90 tablet 3   cefdinir (OMNICEF) 300 MG capsule Take 1 capsule (300 mg total) by mouth 2 (two) times daily. 14 capsule 0   cholecalciferol (VITAMIN D3) 25 MCG (1000 UNIT) tablet Take 2,000 Units by mouth daily.     clobetasol cream (TEMOVATE) 0.05 % Apply 1 application topically 2 (two) times daily. 30 g 0   metFORMIN (GLUCOPHAGE) 500 MG tablet Take 1 tablet (500 mg total) by mouth daily with breakfast. 60 tablet 3   Multiple Vitamin (MULTIVITAMIN WITH MINERALS) TABS tablet Take 1 tablet by mouth daily.     norgestimate-ethinyl estradiol (MILI) 0.25-35 MG-MCG tablet Take 1 tablet by mouth daily. 28 tablet 11    pantoprazole (PROTONIX) 20 MG tablet Take 1 tablet (20 mg total) by mouth daily. 30 tablet 1   QUEtiapine Fumarate 150 MG TABS Take 100 mg by mouth at bedtime. 30 tablet 3   tiZANidine (ZANAFLEX) 4 MG capsule Take 4 mg by mouth 3 (three) times daily.     tizanidine (ZANAFLEX) 6 MG capsule Take 1 capsule (6 mg total) by mouth 3 (three) times daily as needed for muscle spasms. 90 capsule 1   valACYclovir (VALTREX) 1000 MG tablet Take 1 tablet (1,000 mg total) by mouth daily. 60 tablet 2   No current facility-administered medications for this visit.     Musculoskeletal: Strength & Muscle Tone:  Unable to assess due to telehealth visit Gait & Station:  Unable to assess due to telehealth visit Patient leans: N/A  Psychiatric Specialty Exam: Review of Systems  There were no vitals taken for this visit.There is no height or weight on file to calculate BMI.  General Appearance: Well Groomed  Eye Contact:  Good  Speech:  Clear and Coherent and Normal Rate  Volume:  Normal  Mood:  Anxious and Depressed  Affect:  Congruent  Thought Process:  Coherent, Goal Directed and Linear  Orientation:  Full (Time, Place, and Person)  Thought Content: Logical and Hallucinations: Visual   Suicidal Thoughts:  No  Homicidal Thoughts:  No  Memory:  Immediate;   Good Recent;   Good Remote;   Good  Judgement:  Good  Insight:  Good  Psychomotor Activity:  Normal  Concentration:  Concentration: Good and Attention Span: Good  Recall:  Good  Fund of Knowledge: Good  Language: Good  Akathisia:  No  Handed:  Right  AIMS (if indicated): Not done  Assets:  Communication Skills Desire for Improvement Housing Leisure Time Social Support  ADL's:  Intact  Cognition: WNL  Sleep:  Good   Screenings: AUDIT    Flowsheet Row [facility]  Alcohol Use Disorder Identification Test Final Score (AUDIT) 0      GAD-7    Flowsheet Row  Video Visit from 09/21/2020 in [facility] Office Visit from 05/16/2020 in [facility] [employer] [facility] Office Visit from 04/02/2020 in [facility] [employer] at [facility]  Total GAD-7 Score 16 0 0      PHQ2-9    Flowsheet Row Video Visit from 09/21/2020 in [facility] Office Visit from 05/21/2020 in [facility] Office Visit from [facility]/16/2020 in [facility] Office Visit from 04/02/2020 in [facility] Video Visit from 12/06/2019 in [facility]  PHQ-2 Total Score 6 0 0 0 4  PHQ-9 Total Score 24 0 0 0 13      Flowsheet Row Video Visit from 09/21/2020 in [facility] ED from 09/08/2020 in [facility] from 08/04/2020 in [facility]  C-SSRS RISK CATEGORY No Risk No Risk No Risk        Assessment and Plan: Patient endorses increased depression, anxiety, PTSD, Bulimia, and hypomania. Today she will not restart Zoloft as she has discontinued it. She will start Prozac 10 mg to help manage anxiety, depression, and eating condition. She will restart Prazosin to help manage nightmares. She will start Lamictal 25 mg for two weeks and then increase to 50 mg. She will takes Seroquel 150 nightly as she notes her 25 mg of Seroquel is to sedating in the day time.    1. Schizoaffective disorder, bipolar type (HCC)  Start- FLUoxetine (PROZAC) 10 MG capsule; Take 1 capsule (10 mg total) by mouth daily.  Dispense: 30 capsule; Refill: 3 Start- lamoTRIgine (LAMICTAL) 25 MG tablet; Take 1 tablet (25 mg total) by mouth daily.  Dispense: 30 tablet; Refill: 2 Continue- QUEtiapine Fumarate 150 MG TABS; Take 100 mg by mouth at bedtime.  Dispense: 30  tablet; Refill: 3  2. PTSD (post-traumatic stress disorder)  Restart- prazosin (MINIPRESS) 1 MG capsule; Take 1 capsule (1 mg total) by mouth at bedtime.  Dispense: 30 capsule; Refill: 3  3. Bulimia nervosa  Start- FLUoxetine (PROZAC) 10 MG capsule; Take 1 capsule (10 mg total) by mouth daily.  Dispense: 30 capsule; Refill: 3  Follow up in 3 months    Shanna Cisco, NP 09/21/2020, 10:25 AM

## 2020-09-24 ENCOUNTER — Other Ambulatory Visit: Payer: Self-pay

## 2020-09-25 ENCOUNTER — Encounter: Payer: Self-pay | Admitting: Neurology

## 2020-09-25 ENCOUNTER — Inpatient Hospital Stay: Payer: Self-pay | Admitting: Neurology

## 2020-09-25 ENCOUNTER — Other Ambulatory Visit: Payer: Self-pay

## 2020-09-26 ENCOUNTER — Other Ambulatory Visit: Payer: Self-pay

## 2020-09-28 ENCOUNTER — Other Ambulatory Visit: Payer: Self-pay

## 2020-10-01 ENCOUNTER — Other Ambulatory Visit: Payer: Self-pay

## 2020-10-08 ENCOUNTER — Other Ambulatory Visit: Payer: Self-pay

## 2020-10-12 ENCOUNTER — Other Ambulatory Visit: Payer: Self-pay | Admitting: Critical Care Medicine

## 2020-10-13 MED ORDER — CLOBETASOL PROPIONATE 0.05 % EX CREA
1.0000 "application " | TOPICAL_CREAM | Freq: Two times a day (BID) | CUTANEOUS | 0 refills | Status: DC
  Filled 2020-10-13: qty 30, 7d supply, fill #0

## 2020-10-15 ENCOUNTER — Other Ambulatory Visit: Payer: Self-pay

## 2020-10-16 ENCOUNTER — Ambulatory Visit: Payer: Self-pay

## 2020-10-16 ENCOUNTER — Telehealth (HOSPITAL_COMMUNITY): Payer: Self-pay | Admitting: Psychiatry

## 2020-10-16 ENCOUNTER — Telehealth (INDEPENDENT_AMBULATORY_CARE_PROVIDER_SITE_OTHER): Payer: No Payment, Other | Admitting: Psychiatry

## 2020-10-16 ENCOUNTER — Encounter (HOSPITAL_COMMUNITY): Payer: Self-pay | Admitting: Psychiatry

## 2020-10-16 ENCOUNTER — Other Ambulatory Visit: Payer: Self-pay

## 2020-10-16 DIAGNOSIS — F250 Schizoaffective disorder, bipolar type: Secondary | ICD-10-CM | POA: Diagnosis not present

## 2020-10-16 DIAGNOSIS — F502 Bulimia nervosa: Secondary | ICD-10-CM | POA: Diagnosis not present

## 2020-10-16 DIAGNOSIS — F4310 Post-traumatic stress disorder, unspecified: Secondary | ICD-10-CM | POA: Diagnosis not present

## 2020-10-16 MED ORDER — FLUOXETINE HCL 20 MG PO CAPS
20.0000 mg | ORAL_CAPSULE | Freq: Every day | ORAL | 3 refills | Status: DC
  Filled 2020-10-16 – 2021-05-10 (×2): qty 30, 30d supply, fill #0

## 2020-10-16 MED ORDER — PRAZOSIN HCL 1 MG PO CAPS
1.0000 mg | ORAL_CAPSULE | Freq: Every day | ORAL | 3 refills | Status: DC
  Filled 2020-10-16: qty 30, 30d supply, fill #0

## 2020-10-16 MED ORDER — LAMOTRIGINE 25 MG PO TABS
75.0000 mg | ORAL_TABLET | Freq: Every day | ORAL | 2 refills | Status: DC
  Filled 2020-10-16: qty 120, 40d supply, fill #0
  Filled 2021-05-10: qty 90, 30d supply, fill #0

## 2020-10-16 MED ORDER — QUETIAPINE FUMARATE ER 200 MG PO TB24
200.0000 mg | ORAL_TABLET | Freq: Every day | ORAL | 3 refills | Status: DC
  Filled 2020-10-16 – 2020-10-29 (×3): qty 30, 30d supply, fill #0
  Filled 2020-12-17: qty 30, 30d supply, fill #1

## 2020-10-16 NOTE — Telephone Encounter (Signed)
      Answer Assessment - Initial Assessment Questions 1. REASON FOR CALL or QUESTION: "What is your reason for calling today?" or "How can I best help you?" or "What question do you have that I can help answer?"     Pt needs behavioral health forms: " Residual capacity and mental function capacity" Pt stated her Psych NP stated pt's PCP should fill out form=fax number provided.  Protocols used: Information Only Call - No Triage-A-AH

## 2020-10-16 NOTE — Telephone Encounter (Signed)
Spoke to pt informed her Dr. Doyne Keel is unable to complete the assessment forms. Pt will need PCP and a specialized mental health provider for the assessment forms. Pt verbalized understanding and was without complaint.

## 2020-10-16 NOTE — Telephone Encounter (Signed)
Provider spoke to patient earlier and informed her that this form could not be filled out. She endorsed understanding and requested copies of her visit summaries and notes. Administrative staff sent requested documents to patient. No other concerns noted at this time.

## 2020-10-16 NOTE — Progress Notes (Signed)
BH MD/PA/NP OP Progress Note Virtual Visit via Telephone Note  I connected with [name] on 10/16/20 at 10:30 AM EDT by telephone and verified that I am speaking with the correct person using two identifiers.  Location: Patient: home Provider: Clinic   I discussed the limitations, risks, security and privacy concerns of performing an evaluation and management service by telephone and the availability of in person appointments. I also discussed with the patient that there may be a patient responsible charge related to this service. The patient expressed understanding and agreed to proceed.   I provided 30 minutes of non-face-to-face time during this encounter.         10/16/2020 11:40 AM [name]  MRN:  [id number]  Chief Complaint: "My anxiety is pretty high and Im having hallucinations and delusions"   HPI: 40 year old female seen today for follow up psychiatric evaluation. She has psychiatric history of schizoaffective disorder, bipolar 1, depression, anxiety, and PTSD. She is currently managed on Prozac 10 mg daily, Prazosin 1 mg nightly, Lamictal 50 mg daily, Seroquel 150 mg nightly, and hydroxyzine 25 mg 3 times a day. She notes her medications are somewhat effective in managing her symptoms.  Today she was unable to logon virtually so assessment was done over the phone.  During exam she was pleasant, cooperative, and engaged in conversation. She informed Clinical research associate that since her last visit she has been having increased anxiety, headaches, and chronic back pain. She recently left her job due to back pain and has applied for disability. She informed Clinical research associate that she is concerned about her disability approval or denial and reports that this stressor is exacerbating her anxiety and depression.  She notes that it is stressed out so much that she has been exhibiting symptoms of a stroke.  She endorses numbness and tingling in her arms and legs.  She notes that she will follow-up  with her PCP soon to address this however notes that she is considering going to the ED.   Today provider conducted a GAD-7 and patient scored a 21, at her last visit she scored a 16. Provider also conducted a PHQ-9 and patient scored a 27, at her last visit she scored a 24. Patient states she is most concerned about her mental and physical health, finances, the disability claim, and her hallucinations.  She notes that her visual hallucinations have been occurring more often, and she sees dark shadows. She sometimes hears voices.   Today she endorses passive SI noting that at times she thinks about  dying however notes that she would never harm her self.  She notes that the last time she attempted to harm her self was in 2015.  Today she denies SI/HI.  Patient endorses symptoms of hypomania such as distractibility, irritability, fluctuations in mood, and impulsive spending.  She informed Clinical research associate that a few months ago she randomly gave her thousand dollars to a stranger.  She also notes at times she is delusional.  She informed Clinical research associate that at times she is unable to correctly identify reality.  She notes that she feels that she makes up imaginary friends to talk to to cope with past traumas.  Patient unable to elaborate.  Provider asked patient if she had been going to counseling and she notes that she was not.  An appointment was set for therapy in December.  She endorses fair sleep noting that she sleeps 4-6 hours per night.  Patient notes that her eating patterns continue to be  poor. She endorses binging and participating in restrictive eating patterns. She denies purging behavior. She informed Clinical research associate that she tries to exercise once per week to lose weight and control her pre-diabetes.  Today she is agreeable to increasing Prozac increase to 20 mg daily to help manage anxiety, depression, and bulimia.  She is also agreeable to increasing Lamictal 50 mg for 75 mg for 2 weeks and then increasing to 100 mg to  help stabilize mood.  Seroquel was increased to 200 mg nightly to help manage symptoms of psychosis.  She will follow-up with outpatient counseling for therapy.  Patient notes in the future she may be interested in a long-acting injectable to help manage her symptoms.  Provider recommended Gean Birchwood.  No other concerns noted at this time.   Visit Diagnosis:    ICD-10-CM   1. Schizoaffective disorder, bipolar type (HCC)  F25.0 FLUoxetine (PROZAC) 20 MG capsule    lamoTRIgine (LAMICTAL) 25 MG tablet    QUEtiapine (SEROQUEL XR) 200 MG 24 hr tablet    2. Bulimia nervosa  F50.2 FLUoxetine (PROZAC) 20 MG capsule    3. PTSD (post-traumatic stress disorder)  F43.10 prazosin (MINIPRESS) 1 MG capsule      Past Psychiatric History: Schizoaffective disorder, Bipolar 1, Depression, Anxiety, and PTSD  Past Medical History:  Past Medical History:  Diagnosis Date   Bipolar 1 disorder (HCC)    Herpes simplex virus (HSV) infection    dx 2001   Polycystic ovary disease    Schizophrenia (HCC)    Stroke [facility])     Past Surgical History:  Procedure Laterality Date   BUBBLE STUDY  05/25/2020   Procedure: BUBBLE STUDY;  Surgeon: Chrystie Nose, MD;  Location: [facility] ENDOSCOPY;  Service: Cardiovascular;;   NO PAST SURGERIES     TEE WITHOUT CARDIOVERSION N/A 05/25/2020   Procedure: TRANSESOPHAGEAL ECHOCARDIOGRAM (TEE);  Surgeon: Chrystie Nose, MD;  Location: [facility] ENDOSCOPY;  Service: Cardiovascular;  Laterality: N/A;    Family Psychiatric History: Bipolar, Schizophrenia, and Depressed. Father Bipolar disorder.   Family History:  Family History  Problem Relation Age of Onset   Bipolar disorder Mother    Diabetes Maternal Grandmother    Hypertension Neg Hx    Heart disease Neg Hx    Cancer Neg Hx     Social History:  Social History   Socioeconomic History   Marital status: Single    Spouse name: Not on file   Number of children: Not on file   Years of education: Not on file   Highest  education level: Not on file  Occupational History   Not on file  Tobacco Use   Smoking status: Former    Packs/day: 0.25    Years: 15.00    Pack years: 3.75    Types: Cigarettes    Quit date: 05/02/2020    Years since quitting: 0.4   Smokeless tobacco: Never  Vaping Use   Vaping Use: Never used  Substance and Sexual Activity   Alcohol use: No    Comment: occ   Drug use: No   Sexual activity: Not Currently    Birth control/protection: Abstinence  Other Topics Concern   Not on file  Social History Narrative   Not on file   Social Determinants of Health   Financial Resource Strain: Not on file  Food Insecurity: Not on file  Transportation Needs: Not on file  Physical Activity: Not on file  Stress: Not on file  Social Connections: Not on file  Allergies: No Known Allergies  Metabolic Disorder Labs: Lab Results  Component Value Date   HGBA1C 5.7 (H) 06/15/2020   MPG 117 06/15/2020   MPG 128.37 05/24/2020   Lab Results  Component Value Date   PROLACTIN 13.0 04/28/2015   Lab Results  Component Value Date   CHOL 143 05/24/2020   TRIG 142 05/24/2020   HDL 52 05/24/2020   CHOLHDL 2.8 05/24/2020   VLDL 28 05/24/2020   LDLCALC 63 05/24/2020   LDLCALC 78 05/21/2020   Lab Results  Component Value Date   TSH 2.110 04/02/2020   TSH 2.040 10/26/2018    Therapeutic Level Labs: No results found for: LITHIUM No results found for: VALPROATE No components found for:  CBMZ  Current Medications: Current Outpatient Medications  Medication Sig Dispense Refill   atorvastatin (LIPITOR) 10 MG tablet Take 1 tablet (10 mg total) by mouth daily. 90 tablet 3   cefdinir (OMNICEF) 300 MG capsule Take 1 capsule (300 mg total) by mouth 2 (two) times daily. 14 capsule 0   cholecalciferol (VITAMIN D3) 25 MCG (1000 UNIT) tablet Take 2,000 Units by mouth daily.     clobetasol cream (TEMOVATE) 0.05 % Apply 1 application topically 2 (two) times daily. 30 g 0   FLUoxetine (PROZAC)  20 MG capsule Take 1 capsule (20 mg total) by mouth daily. 30 capsule 3   lamoTRIgine (LAMICTAL) 25 MG tablet Take 3 tablets (75 mg total) by mouth daily. 120 tablet 2   metFORMIN (GLUCOPHAGE) 500 MG tablet Take 1 tablet (500 mg total) by mouth daily with breakfast. 60 tablet 3   Multiple Vitamin (MULTIVITAMIN WITH MINERALS) TABS tablet Take 1 tablet by mouth daily.     norgestimate-ethinyl estradiol (MILI) 0.25-35 MG-MCG tablet Take 1 tablet by mouth daily. 28 tablet 11   pantoprazole (PROTONIX) 20 MG tablet Take 1 tablet (20 mg total) by mouth daily. 30 tablet 1   prazosin (MINIPRESS) 1 MG capsule Take 1 capsule (1 mg total) by mouth at bedtime. 30 capsule 3   QUEtiapine (SEROQUEL XR) 200 MG 24 hr tablet Take 1 tablet (200 mg total) by mouth at bedtime. 30 tablet 3   tiZANidine (ZANAFLEX) 4 MG capsule Take 4 mg by mouth 3 (three) times daily.     tizanidine (ZANAFLEX) 6 MG capsule Take 1 capsule (6 mg total) by mouth 3 (three) times daily as needed for muscle spasms. 90 capsule 1   valACYclovir (VALTREX) 1000 MG tablet Take 1 tablet (1,000 mg total) by mouth daily. 60 tablet 2   No current facility-administered medications for this visit.     Musculoskeletal: Strength & Muscle Tone:  Unable to assess due to telephone visit Gait & Station:  Unable to assess due to telephone visit Patient leans: N/A  Psychiatric Specialty Exam: Review of Systems  There were no vitals taken for this visit.There is no height or weight on file to calculate BMI.  General Appearance:  Unable to assess due to telephone visit  Eye Contact:   Unable to assess due to telephone visit  Speech:  Clear and Coherent and Normal Rate  Volume:  Normal  Mood:  Anxious and Depressed  Affect:  Congruent  Thought Process:  Coherent, Goal Directed and Linear  Orientation:  Full (Time, Place, and Person)  Thought Content: Logical and Hallucinations: Auditory Visual   Suicidal Thoughts:  Yes.  without intent/plan   Homicidal Thoughts:  No  Memory:  Immediate;   Good Recent;   Good Remote;  Good  Judgement:  Good  Insight:  Good  Psychomotor Activity:  Normal  Concentration:  Concentration: Good and Attention Span: Good  Recall:  Good  Fund of Knowledge: Good  Language: Good  Akathisia:  No  Handed:  Right  AIMS (if indicated): Not done  Assets:  Communication Skills Desire for Improvement Housing Leisure Time Social Support  ADL's:  Intact  Cognition: WNL  Sleep:  Good   Screenings: AUDIT    Flowsheet Row [facility]  Alcohol Use Disorder Identification Test Final Score (AUDIT) 0      GAD-7    Flowsheet Row Video Visit from 10/16/2020 in [facility] Video Visit from 09/21/2020 in [facility] Office Visit [facility] Office Visit from 04/02/2020 in [facility]  Total GAD-7 Score 21 16 0 0      PHQ2-9    Flowsheet Row Video Visit from 10/16/2020 in [facility] Video Visit from 09/21/2020 in [facility] Office Visit from 05/21/2020 in [facility] Office Visit from 05/16/2020 in [facility] Office Visit from 04/02/2020 in [facility]  PHQ-2 Total Score 6 6 0 0 0  PHQ-9 Total Score 27 24 0 0 0      Flowsheet Row Video Visit from 10/16/2020 in [facility] Video Visit from 09/21/2020 in [facility] ED from 09/08/2020 in [facility]  C-SSRS RISK CATEGORY Error: Q7 should not be populated when Q6 is No No Risk No Risk        Assessment and Plan: Patient  endorses increased depression, anxiety, PTSD, Bulimia, and hypomania. Today she is agreeable to increasing Prozac increase to 20 mg daily to help manage anxiety, depression, and bulimia.  She is also agreeable to increasing Lamictal 50 mg for 75 mg for 2 weeks and then increasing to 100 mg to help stabilize mood.  Seroquel was increased to 200 mg nightly to help manage symptoms of psychosis.  Patient notes in the future she may be interested in a long-acting injectable to help manage her symptoms.  Provider recommended Gean Birchwood.  1. Schizoaffective disorder, bipolar type (HCC)  Increased- FLUoxetine (PROZAC) 20 MG capsule; Take 1 capsule (20 mg total) by mouth daily.  Dispense: 30 capsule; Refill: 3 Increased- lamoTRIgine (LAMICTAL) 25 MG tablet; Take 3 tablets (75 mg total) by mouth daily.  Dispense: 120 tablet; Refill: 2 Increased- QUEtiapine (SEROQUEL XR) 200 MG 24 hr tablet; Take 1 tablet (200 mg total) by mouth at bedtime.  Dispense: 30 tablet; Refill: 3  2. Bulimia nervosa  Increased- FLUoxetine (PROZAC) 20 MG capsule; Take 1 capsule (20 mg total) by mouth daily.  Dispense: 30 capsule; Refill: 3  3. PTSD (post-traumatic stress disorder)  Continue- prazosin (MINIPRESS) 1 MG capsule; Take 1 capsule (1 mg total) by mouth at bedtime.  Dispense: 30 capsule; Refill: 3  Follow up in 3 months Follow-up with therapy   Shanna Cisco, NP 10/16/2020, 11:40 AM

## 2020-10-16 NOTE — Telephone Encounter (Signed)
Patient provided Mental Residual Functional Capacity forms for provider to complete. Call pt once forms are complete [phone]. Placed forms in Reece's bin for review.

## 2020-10-17 ENCOUNTER — Other Ambulatory Visit (HOSPITAL_COMMUNITY): Payer: Self-pay | Admitting: Psychiatry

## 2020-10-17 ENCOUNTER — Telehealth: Payer: Self-pay | Admitting: *Deleted

## 2020-10-17 ENCOUNTER — Other Ambulatory Visit: Payer: Self-pay

## 2020-10-17 DIAGNOSIS — F250 Schizoaffective disorder, bipolar type: Secondary | ICD-10-CM

## 2020-10-17 NOTE — Telephone Encounter (Signed)
Copied from CRM [id number]. Topic: General - Call Back - No Documentation >> Oct 16, 2020 11:08 AM Randol Kern wrote: Pt needs paperwork for mental functional capacity & residual functioning capacity form. Also transferred to triage  Best contact: [phone]

## 2020-10-18 ENCOUNTER — Other Ambulatory Visit: Payer: Self-pay

## 2020-10-18 NOTE — Telephone Encounter (Signed)
Pt was called and a VM was left informing patient to have paperwork re faxed due to fax machine being down last week.

## 2020-10-18 NOTE — Telephone Encounter (Signed)
Looks like she has scheduled an appointment for 10/23/2020

## 2020-10-19 ENCOUNTER — Other Ambulatory Visit: Payer: Self-pay

## 2020-10-19 ENCOUNTER — Other Ambulatory Visit (HOSPITAL_COMMUNITY): Payer: Self-pay | Admitting: Psychiatry

## 2020-10-19 DIAGNOSIS — F250 Schizoaffective disorder, bipolar type: Secondary | ICD-10-CM

## 2020-10-22 ENCOUNTER — Other Ambulatory Visit: Payer: Self-pay

## 2020-10-23 ENCOUNTER — Telehealth: Payer: Medicaid Other | Admitting: Nurse Practitioner

## 2020-10-23 ENCOUNTER — Other Ambulatory Visit: Payer: Self-pay

## 2020-10-23 ENCOUNTER — Telehealth: Payer: Medicaid Other | Admitting: Physician Assistant

## 2020-10-25 ENCOUNTER — Encounter: Payer: Self-pay | Admitting: Physician Assistant

## 2020-10-25 ENCOUNTER — Other Ambulatory Visit: Payer: Self-pay

## 2020-10-25 ENCOUNTER — Ambulatory Visit: Payer: Self-pay | Attending: Nurse Practitioner | Admitting: Physician Assistant

## 2020-10-25 ENCOUNTER — Telehealth (HOSPITAL_COMMUNITY): Payer: Self-pay | Admitting: *Deleted

## 2020-10-25 DIAGNOSIS — M546 Pain in thoracic spine: Secondary | ICD-10-CM

## 2020-10-25 DIAGNOSIS — G8929 Other chronic pain: Secondary | ICD-10-CM

## 2020-10-25 NOTE — Telephone Encounter (Addendum)
Pt calling back this morning because she states Cari could not help her this morning.  Pt feels like she should not be charged for this visit.  She no longer has medicaid. Pt would like a video visit with Dr Delford Field, if he could please work her in. Pt states it is hard to get around, and she prefers not to come in. Pt needs paperwork filled out to apply for disability.   Pt states her pain is too bad to get around. Please advise.  Pt also wants to know what Rulon Eisenmenger is or is not going to do, because she told her she could not help her since she is not her pcp.   Pt also wants to know would another provider be able to fill out the disability paperwork? Pt going to try to forward paperwork via [employer]

## 2020-10-25 NOTE — Patient Instructions (Signed)
I spoke with Dr. Delford Field on your behalf, and he has agreed to work you into his schedule, your appointment will be on November 01, 2020 at 8:30 AM.  I am aware that you have been notified of this appointment.  Please let us know if there is anything else we can do for you.

## 2020-10-25 NOTE — Progress Notes (Signed)
Established Patient Office Visit  Subjective:  Patient ID: Sarah Evans, female    DOB: August 12, 1980  Age: 40 y.o. MRN: [id number]  CC:  Chief Complaint  Patient presents with   Back Pain    Virtual Visit via Telephone Note  I connected with Sarah Evans on 10/25/20 at  8:30 AM EDT by telephone and verified that I am speaking with the correct person using two identifiers.  Location: Patient: Home [facility]   I discussed the limitations, risks, security and privacy concerns of performing an evaluation and management service by telephone and the availability of in person appointments. I also discussed with the patient that there may be a patient responsible charge related to this service. The patient expressed understanding and agreed to proceed.   History of Present Illness: Patient states that she continues to have back pain secondary to complications from her stroke in May 2022.  Does endorse history of chronic back pain, but states it is much worse, states that he did has made it difficult for her to complete her work requirements.  Does request assistance with disability paperwork.  Patient was unable to have video available for her virtual visit, was switched to telephone encounter   Observations/Objective: Medical history and current medications reviewed, no physical exam completed    Past Medical History:  Diagnosis Date   Bipolar 1 disorder ([place])    Herpes simplex virus (HSV) infection    dx 2001   Polycystic ovary disease    Schizophrenia ([place])    Stroke [facility])     Past Surgical History:  Procedure Laterality Date   BUBBLE STUDY  05/25/2020   Procedure: BUBBLE STUDY;  Surgeon: Pixie Casino, MD;  Location: [place];  Service: Cardiovascular;;   NO PAST SURGERIES     TEE WITHOUT CARDIOVERSION N/A 05/25/2020   Procedure: TRANSESOPHAGEAL ECHOCARDIOGRAM (TEE);  Surgeon: Pixie Casino, MD;  Location: [facility] ENDOSCOPY;   Service: Cardiovascular;  Laterality: N/A;    Family History  Problem Relation Age of Onset   Bipolar disorder Mother    Diabetes Maternal Grandmother    Hypertension Neg Hx    Heart disease Neg Hx    Cancer Neg Hx     Social History   Socioeconomic History   Marital status: Single    Spouse name: Not on file   Number of children: Not on file   Years of education: Not on file   Highest education level: Not on file  Occupational History   Not on file  Tobacco Use   Smoking status: Former    Packs/day: 0.25    Years: 15.00    Pack years: 3.75    Types: Cigarettes    Quit date: 05/02/2020    Years since quitting: 0.4   Smokeless tobacco: Never  Vaping Use   Vaping Use: Never used  Substance and Sexual Activity   Alcohol use: No    Comment: occ   Drug use: No   Sexual activity: Not Currently    Birth control/protection: Abstinence  Other Topics Concern   Not on file  Social History Narrative   Not on file   Social Determinants of Health   Financial Resource Strain: Not on file  Food Insecurity: Not on file  Transportation Needs: Not on file  Physical Activity: Not on file  Stress: Not on file  Social Connections: Not on file  Intimate Partner Violence: Not on file    Outpatient Medications  Prior to Visit  Medication Sig Dispense Refill   atorvastatin (LIPITOR) 10 MG tablet Take 1 tablet (10 mg total) by mouth daily. 90 tablet 3   cefdinir (OMNICEF) 300 MG capsule Take 1 capsule (300 mg total) by mouth 2 (two) times daily. 14 capsule 0   cholecalciferol (VITAMIN D3) 25 MCG (1000 UNIT) tablet Take 2,000 Units by mouth daily.     clobetasol cream (TEMOVATE) 1.69 % Apply 1 application topically 2 (two) times daily. 30 g 0   FLUoxetine (PROZAC) 20 MG capsule Take 1 capsule (20 mg total) by mouth daily. 30 capsule 3   lamoTRIgine (LAMICTAL) 25 MG tablet Take 3 tablets (75 mg total) by mouth daily. 120 tablet 2   metFORMIN (GLUCOPHAGE) 500 MG tablet Take 1 tablet  (500 mg total) by mouth daily with breakfast. 60 tablet 3   Multiple Vitamin (MULTIVITAMIN WITH MINERALS) TABS tablet Take 1 tablet by mouth daily.     norgestimate-ethinyl estradiol (MILI) 0.25-35 MG-MCG tablet Take 1 tablet by mouth daily. 28 tablet 11   pantoprazole (PROTONIX) 20 MG tablet Take 1 tablet (20 mg total) by mouth daily. 30 tablet 1   prazosin (MINIPRESS) 1 MG capsule Take 1 capsule (1 mg total) by mouth at bedtime. 30 capsule 3   QUEtiapine (SEROQUEL) 100 MG tablet TAKE ONE TABLET BY MOUTH DAILY AT BEDTIME 30 tablet 0   QUEtiapine (SEROQUEL XR) 200 MG 24 hr tablet Take 1 tablet (200 mg total) by mouth at bedtime. 30 tablet 3   tiZANidine (ZANAFLEX) 4 MG capsule Take 4 mg by mouth 3 (three) times daily.     tizanidine (ZANAFLEX) 6 MG capsule Take 1 capsule (6 mg total) by mouth 3 (three) times daily as needed for muscle spasms. 90 capsule 1   valACYclovir (VALTREX) 1000 MG tablet Take 1 tablet (1,000 mg total) by mouth daily. 60 tablet 2   No facility-administered medications prior to visit.    No Known Allergies  ROS Review of Systems  Constitutional: Negative.   HENT: Negative.    Eyes: Negative.   Respiratory:  Negative for shortness of breath.   Cardiovascular:  Negative for chest pain.  Gastrointestinal: Negative.   Endocrine: Negative.   Genitourinary: Negative.   Musculoskeletal:  Positive for back pain.  Skin: Negative.   Allergic/Immunologic: Negative.   Neurological: Negative.   Hematological: Negative.   Psychiatric/Behavioral: Negative.       Objective:      There were no vitals taken for this visit. Wt Readings from Last 3 Encounters:  09/08/20 174 lb (78.9 kg)  08/04/20 177 lb (80.3 kg)  06/15/20 182 lb (82.6 kg)     Health Maintenance Due  Topic Date Due   COVID-19 Vaccine (1) Never done   Pneumococcal Vaccine 30-32 Years old (1 - PCV) Never done   OPHTHALMOLOGY EXAM  Never done   INFLUENZA VACCINE  08/06/2020    There are no  preventive care reminders to display for this patient.  Lab Results  Component Value Date   TSH 2.110 04/02/2020   Lab Results  Component Value Date   WBC 8.5 09/08/2020   HGB 12.4 09/08/2020   HCT 39.1 09/08/2020   MCV 88.7 09/08/2020   PLT 343 09/08/2020   Lab Results  Component Value Date   NA 138 09/08/2020   K 3.7 09/08/2020   CO2 24 09/08/2020   GLUCOSE 94 09/08/2020   BUN 22 (H) 09/08/2020   CREATININE 1.13 (H) 09/08/2020   BILITOT 0.5  05/27/2020   ALKPHOS 75 05/27/2020   AST 22 05/27/2020   ALT 33 05/27/2020   PROT 7.9 05/27/2020   ALBUMIN 4.0 05/27/2020   CALCIUM 9.3 09/08/2020   ANIONGAP 10 09/08/2020   EGFR 68 05/21/2020   Lab Results  Component Value Date   CHOL 143 05/24/2020   Lab Results  Component Value Date   HDL 52 05/24/2020   Lab Results  Component Value Date   LDLCALC 63 05/24/2020   Lab Results  Component Value Date   TRIG 142 05/24/2020   Lab Results  Component Value Date   CHOLHDL 2.8 05/24/2020   Lab Results  Component Value Date   HGBA1C 5.7 (H) 06/15/2020      Assessment & Plan:   Problem List Items Addressed This Visit   None Visit Diagnoses     Chronic thoracic back pain, unspecified back pain laterality    -  Primary       No orders of the defined types were placed in this encounter. Assessment and Plan:  1. Chronic thoracic back pain, unspecified back pain laterality Patient was given an in person appointment with her primary care provider Dr. Joya Gaskins for paperwork completion.  Patient understands and agrees.    Follow Up Instructions:    I discussed the assessment and treatment plan with the patient. The patient was provided an opportunity to ask questions and all were answered. The patient agreed with the plan and demonstrated an understanding of the instructions.   The patient was advised to call back or seek an in-person evaluation if the symptoms worsen or if the condition fails to improve as  anticipated.  I provided 10 minutes of non-face-to-face time during this encounter.    Follow-up: Return in about 1 week (around 11/01/2020).    Loraine Grip Mayers, PA-C

## 2020-10-25 NOTE — Telephone Encounter (Signed)
Patient called stated she has some paper work that needs to be filled out. Asked what type of paper work & she stated mental health assessment that her PCP wouldn't be able to fill out that portion. Patient then stated she would need paper work back by the end of the month. Informed that her provider is at  a medical Conference returning Wednesday  of next week & she will not have paper work done by that time frame. Patient then stated "oh ok".

## 2020-10-25 NOTE — Telephone Encounter (Signed)
This pt will need a sooner visit with me for paperwork, I have the forms

## 2020-10-26 ENCOUNTER — Other Ambulatory Visit: Payer: Self-pay

## 2020-10-29 ENCOUNTER — Other Ambulatory Visit: Payer: Self-pay

## 2020-11-01 ENCOUNTER — Encounter: Payer: Self-pay | Admitting: Critical Care Medicine

## 2020-11-01 ENCOUNTER — Ambulatory Visit: Payer: Self-pay | Attending: Critical Care Medicine | Admitting: Critical Care Medicine

## 2020-11-01 ENCOUNTER — Other Ambulatory Visit: Payer: Self-pay

## 2020-11-01 VITALS — BP 118/80 | HR 82 | Resp 16 | Wt 183.4 lb

## 2020-11-01 DIAGNOSIS — F331 Major depressive disorder, recurrent, moderate: Secondary | ICD-10-CM

## 2020-11-01 DIAGNOSIS — E782 Mixed hyperlipidemia: Secondary | ICD-10-CM

## 2020-11-01 DIAGNOSIS — M542 Cervicalgia: Secondary | ICD-10-CM | POA: Insufficient documentation

## 2020-11-01 DIAGNOSIS — I639 Cerebral infarction, unspecified: Secondary | ICD-10-CM

## 2020-11-01 DIAGNOSIS — F250 Schizoaffective disorder, bipolar type: Secondary | ICD-10-CM

## 2020-11-01 DIAGNOSIS — R7303 Prediabetes: Secondary | ICD-10-CM

## 2020-11-01 DIAGNOSIS — Z8673 Personal history of transient ischemic attack (TIA), and cerebral infarction without residual deficits: Secondary | ICD-10-CM

## 2020-11-01 DIAGNOSIS — G8929 Other chronic pain: Secondary | ICD-10-CM

## 2020-11-01 DIAGNOSIS — F411 Generalized anxiety disorder: Secondary | ICD-10-CM

## 2020-11-01 DIAGNOSIS — M546 Pain in thoracic spine: Secondary | ICD-10-CM

## 2020-11-01 DIAGNOSIS — Z23 Encounter for immunization: Secondary | ICD-10-CM

## 2020-11-01 NOTE — Progress Notes (Signed)
Established Patient Office Visit  Subjective:  Patient ID: Sarah Evans, female    DOB: 20-Nov-1980  Age: 40 y.o. MRN: [id number]  CC:  Chief Complaint  Patient presents with   paperwork    HPI Sarah Evans presents for assistance with filling out mental and physical residual functional capacity assessments.  The patient had a significant stroke earlier this year leaving the patient with left upper extremity weakness and pain.  She also has chronic midthoracic and neck pain as well.  Patient has hyperlipidemia with type 2 diabetes and severe bipolar disorder and severe depression.  She has had psychotic features but not full-blown schizophrenia.  Despite treatment with mental health her depression and bipolar disorder have been significantly impacting her abilities.    The patient has not been able to perform her job and has been out of work for some time.  She sits at home is not able to engage in any social interactions.  Note she is not suicidal.  She was not able to focus on her job and the back pain and left upper extremity weakness and pain have created a situation where she drops objects frequently.  She also has left leg weakness cannot walk.  She has chronic headaches dizziness and feels faint.  The patient also has visual acuity issues in the left eye after the stroke  On arrival blood pressure is good 118/80.  Her diabetes has been reasonably controlled on metformin alone.  She is on statin therapy along with medication for blood pressure.  Minimal residual functional assessment is done as outlined below and per the forms functional residual capacity also is done per forms below. Past Medical History:  Diagnosis Date   Bipolar 1 disorder ([place])    Herpes simplex virus (HSV) infection    dx 2001   Polycystic ovary disease    Schizophrenia ([place])    Stroke [facility])     Past Surgical History:  Procedure Laterality Date   BUBBLE STUDY  05/25/2020   Procedure: BUBBLE STUDY;   Surgeon: Pixie Casino, MD;  Location: [facility];  Service: Cardiovascular;;   NO PAST SURGERIES     TEE WITHOUT CARDIOVERSION N/A 05/25/2020   Procedure: TRANSESOPHAGEAL ECHOCARDIOGRAM (TEE);  Surgeon: Pixie Casino, MD;  Location: [facility] ENDOSCOPY;  Service: Cardiovascular;  Laterality: N/A;    Family History  Problem Relation Age of Onset   Bipolar disorder Mother    Diabetes Maternal Grandmother    Hypertension Neg Hx    Heart disease Neg Hx    Cancer Neg Hx     Social History   Socioeconomic History   Marital status: Single    Spouse name: Not on file   Number of children: Not on file   Years of education: Not on file   Highest education level: Not on file  Occupational History   Not on file  Tobacco Use   Smoking status: Former    Packs/day: 0.25    Years: 15.00    Pack years: 3.75    Types: Cigarettes    Quit date: 05/02/2020    Years since quitting: 0.5   Smokeless tobacco: Never  Vaping Use   Vaping Use: Never used  Substance and Sexual Activity   Alcohol use: No    Comment: occ   Drug use: No   Sexual activity: Not Currently    Birth control/protection: Abstinence  Other Topics Concern   Not on file  Social History Narrative   Not  on file   Social Determinants of Health   Financial Resource Strain: Not on file  Food Insecurity: Not on file  Transportation Needs: Not on file  Physical Activity: Not on file  Stress: Not on file  Social Connections: Not on file  Intimate Partner Violence: Not on file    Outpatient Medications Prior to Visit  Medication Sig Dispense Refill   atorvastatin (LIPITOR) 10 MG tablet Take 1 tablet (10 mg total) by mouth daily. 90 tablet 3   clobetasol cream (TEMOVATE) 8.93 % Apply 1 application topically 2 (two) times daily. 30 g 0   FLUoxetine (PROZAC) 20 MG capsule Take 1 capsule (20 mg total) by mouth daily. 30 capsule 3   lamoTRIgine (LAMICTAL) 25 MG tablet Take 3 tablets (75 mg total) by mouth daily. 120 tablet 2    metFORMIN (GLUCOPHAGE) 500 MG tablet Take 1 tablet (500 mg total) by mouth daily with breakfast. 60 tablet 3   norgestimate-ethinyl estradiol (MILI) 0.25-35 MG-MCG tablet Take 1 tablet by mouth daily. 28 tablet 11   pantoprazole (PROTONIX) 20 MG tablet Take 1 tablet (20 mg total) by mouth daily. 30 tablet 1   prazosin (MINIPRESS) 1 MG capsule Take 1 capsule (1 mg total) by mouth at bedtime. 30 capsule 3   QUEtiapine (SEROQUEL XR) 200 MG 24 hr tablet Take 1 tablet (200 mg total) by mouth at bedtime. 30 tablet 3   valACYclovir (VALTREX) 1000 MG tablet Take 1 tablet (1,000 mg total) by mouth daily. 60 tablet 2   cefdinir (OMNICEF) 300 MG capsule Take 1 capsule (300 mg total) by mouth 2 (two) times daily. 14 capsule 0   cholecalciferol (VITAMIN D3) 25 MCG (1000 UNIT) tablet Take 2,000 Units by mouth daily. (Patient not taking: Reported on 11/01/2020)     Multiple Vitamin (MULTIVITAMIN WITH MINERALS) TABS tablet Take 1 tablet by mouth daily. (Patient not taking: Reported on 11/01/2020)     tizanidine (ZANAFLEX) 6 MG capsule Take 1 capsule (6 mg total) by mouth 3 (three) times daily as needed for muscle spasms. (Patient not taking: Reported on 11/01/2020) 90 capsule 1   QUEtiapine (SEROQUEL) 100 MG tablet TAKE ONE TABLET BY MOUTH DAILY AT BEDTIME (Patient not taking: Reported on 11/01/2020) 30 tablet 0   tiZANidine (ZANAFLEX) 4 MG capsule Take 4 mg by mouth 3 (three) times daily. (Patient not taking: Reported on 11/01/2020)     No facility-administered medications prior to visit.    No Known Allergies  ROS Review of Systems  Constitutional:  Positive for activity change and fatigue.  HENT: Negative.  Negative for ear pain, postnasal drip, rhinorrhea, sinus pressure, sore throat, trouble swallowing and voice change.   Eyes:  Positive for visual disturbance.  Respiratory: Negative.  Negative for apnea, cough, choking, chest tightness, shortness of breath, wheezing and stridor.   Cardiovascular:  Negative.  Negative for chest pain, palpitations and leg swelling.  Gastrointestinal: Negative.  Negative for abdominal distention, abdominal pain, nausea and vomiting.  Genitourinary: Negative.   Musculoskeletal:  Positive for back pain, gait problem, neck pain and neck stiffness. Negative for arthralgias and myalgias.  Skin: Negative.  Negative for rash.  Allergic/Immunologic: Negative.  Negative for environmental allergies and food allergies.  Neurological:  Positive for tremors, weakness, light-headedness, numbness and headaches. Negative for dizziness, syncope and facial asymmetry.  Hematological: Negative.  Negative for adenopathy. Does not bruise/bleed easily.  Psychiatric/Behavioral:  Positive for behavioral problems, confusion, decreased concentration, dysphoric mood, hallucinations and sleep disturbance. Negative for agitation, self-injury  and suicidal ideas. The patient is nervous/anxious and is hyperactive.      Objective:    Physical Exam Vitals reviewed.  Constitutional:      Appearance: Normal appearance. She is well-developed. She is obese. She is not diaphoretic.  HENT:     Head: Normocephalic and atraumatic.     Right Ear: Tympanic membrane normal.     Left Ear: Tympanic membrane normal.     Nose: Nose normal. No nasal deformity, septal deviation, mucosal edema or rhinorrhea.     Right Sinus: No maxillary sinus tenderness or frontal sinus tenderness.     Left Sinus: No maxillary sinus tenderness or frontal sinus tenderness.     Mouth/Throat:     Mouth: Mucous membranes are moist.     Pharynx: Oropharynx is clear. No oropharyngeal exudate.  Eyes:     General: No scleral icterus.    Conjunctiva/sclera: Conjunctivae normal.     Pupils: Pupils are equal, round, and reactive to light.  Neck:     Thyroid: No thyromegaly.     Vascular: No carotid bruit or JVD.     Trachea: Trachea normal. No tracheal tenderness or tracheal deviation.  Cardiovascular:     Rate and  Rhythm: Normal rate and regular rhythm.     Chest Wall: PMI is not displaced.     Pulses: Normal pulses. No decreased pulses.     Heart sounds: Normal heart sounds, S1 normal and S2 normal. Heart sounds not distant. No murmur heard. No systolic murmur is present.  No diastolic murmur is present.    No friction rub. No gallop. No S3 or S4 sounds.  Pulmonary:     Effort: Pulmonary effort is normal. No tachypnea, accessory muscle usage or respiratory distress.     Breath sounds: Normal breath sounds. No stridor. No decreased breath sounds, wheezing, rhonchi or rales.  Chest:     Chest wall: No tenderness.  Abdominal:     General: Bowel sounds are normal. There is no distension.     Palpations: Abdomen is soft. Abdomen is not rigid.     Tenderness: There is no abdominal tenderness. There is no guarding or rebound.  Musculoskeletal:     Cervical back: Normal range of motion. Tenderness present. No edema, erythema, rigidity or bony tenderness. Pain with movement present. No muscular tenderness. Normal range of motion.     Thoracic back: Spasms, tenderness and bony tenderness present. Decreased range of motion.     Lumbar back: Normal.       Back:  Lymphadenopathy:     Head:     Right side of head: No submental or submandibular adenopathy.     Left side of head: No submental or submandibular adenopathy.     Cervical: No cervical adenopathy.  Skin:    General: Skin is warm and dry.     Coloration: Skin is not pale.     Findings: No rash.     Nails: There is no clubbing.  Neurological:     Mental Status: She is oriented to person, place, and time.     Cranial Nerves: No cranial nerve deficit.     Sensory: No sensory deficit.     Motor: Weakness present.     Coordination: Coordination normal.     Gait: Gait abnormal.     Deep Tendon Reflexes: Reflexes normal.  Psychiatric:        Attention and Perception: She is inattentive. She does not perceive auditory or visual hallucinations.  Mood and Affect: Mood is depressed. Affect is blunt and flat.        Speech: Speech is delayed.        Behavior: Behavior is slowed and withdrawn. Behavior is cooperative.        Thought Content: Thought content normal. Thought content is not paranoid or delusional. Thought content does not include homicidal or suicidal ideation. Thought content does not include homicidal or suicidal plan.        Cognition and Memory: Cognition is impaired. Memory is impaired. She exhibits impaired recent memory and impaired remote memory.        Judgment: Judgment normal.  The patient has significant deficits in understanding and memory, sustained concentration, social interaction, and adaptation.  From a physical perspective the patient has inability to lift or carry less than 10 pounds, cannot walk continuously, cannot sit for more than an hour without breaks, has limited to ability on the left upper extremity with pushing pulling operations, is not able to stoop kneel crouch crawl balance or climb, she has difficulty with fine motor control in the left hand, she has visual limitations, she has difficulty with environmental limitations in communication.  She also has severe back pain which is a limitation factor.   BP 118/80   Pulse 82   Resp 16   Wt 183 lb 6.4 oz (83.2 kg)   SpO2 100%   BMI 28.72 kg/m  Wt Readings from Last 3 Encounters:  11/01/20 183 lb 6.4 oz (83.2 kg)  09/08/20 174 lb (78.9 kg)  08/04/20 177 lb (80.3 kg)     Health Maintenance Due  Topic Date Due   COVID-19 Vaccine (1) Never done   Pneumococcal Vaccine 73-17 Years old (1 - PCV) Never done   OPHTHALMOLOGY EXAM  Never done    There are no preventive care reminders to display for this patient.  Lab Results  Component Value Date   TSH 2.110 04/02/2020   Lab Results  Component Value Date   WBC 8.5 09/08/2020   HGB 12.4 09/08/2020   HCT 39.1 09/08/2020   MCV 88.7 09/08/2020   PLT 343 09/08/2020   Lab Results   Component Value Date   NA 138 09/08/2020   K 3.7 09/08/2020   CO2 24 09/08/2020   GLUCOSE 94 09/08/2020   BUN 22 (H) 09/08/2020   CREATININE 1.13 (H) 09/08/2020   BILITOT 0.5 05/27/2020   ALKPHOS 75 05/27/2020   AST 22 05/27/2020   ALT 33 05/27/2020   PROT 7.9 05/27/2020   ALBUMIN 4.0 05/27/2020   CALCIUM 9.3 09/08/2020   ANIONGAP 10 09/08/2020   EGFR 68 05/21/2020   Lab Results  Component Value Date   CHOL 143 05/24/2020   Lab Results  Component Value Date   HDL 52 05/24/2020   Lab Results  Component Value Date   LDLCALC 63 05/24/2020   Lab Results  Component Value Date   TRIG 142 05/24/2020   Lab Results  Component Value Date   CHOLHDL 2.8 05/24/2020   Lab Results  Component Value Date   HGBA1C 5.7 (H) 06/15/2020      Assessment & Plan:   Problem List Items Addressed This Visit       Cardiovascular and Mediastinum   Ischemic stroke ([place])    History of ischemic stroke with significant deficits in left upper extremity vision and gait  Patient was not able to complete physical therapy due to lack of funds  Continue statin  Continue blood pressure  medication        Other   Schizoaffective disorder, bipolar type ([place])    Severe depression and bipolar disorder affecting her ability to be productive in a job  Medication management per psychiatry      Moderate episode of recurrent major depressive disorder ([place])    As per schizoaffective disorder      Generalized anxiety disorder   Prediabetes    Continue low-dose metformin      Mixed hyperlipidemia    Continue current therapy      History of ischemic stroke - Primary   Chronic thoracic spine pain    Plan x-rays of spine      Relevant Orders   DG Thoracic Spine 4V   Chronic neck pain    Plan x-rays of neck      Relevant Orders   DG Cervical Spine Complete   Other Visit Diagnoses     Need for immunization against influenza       Relevant Orders   Flu Vaccine QUAD 62moIM  (Fluarix, Fluzone & Alfiuria Quad PF) (Completed)      Flu vaccine given No orders of the defined types were placed in this encounter.   Follow-up: Return in about 2 months (around 01/01/2021).    PAsencion Noble MD

## 2020-11-01 NOTE — Patient Instructions (Signed)
X-ray of your neck and back will be made go [facility]-door to [facility] for this he did not need an appointment  No change in medications  Your paperwork will be processed this afternoon be ready to be picked up tomorrow  Return to see Dr. Delford Field 2 months

## 2020-11-01 NOTE — Assessment & Plan Note (Signed)
Continue low dose metformin

## 2020-11-01 NOTE — Assessment & Plan Note (Signed)
Plan x-rays of spine

## 2020-11-01 NOTE — Assessment & Plan Note (Signed)
Severe depression and bipolar disorder affecting her ability to be productive in a job  Medication management per psychiatry

## 2020-11-01 NOTE — Assessment & Plan Note (Signed)
Plan x-rays of neck

## 2020-11-01 NOTE — Assessment & Plan Note (Signed)
As per schizoaffective disorder

## 2020-11-01 NOTE — Assessment & Plan Note (Signed)
History of ischemic stroke with significant deficits in left upper extremity vision and gait  Patient was not able to complete physical therapy due to lack of funds  Continue statin  Continue blood pressure medication

## 2020-11-01 NOTE — Assessment & Plan Note (Signed)
Continue current therapy 

## 2020-11-02 ENCOUNTER — Telehealth: Payer: Self-pay | Admitting: Critical Care Medicine

## 2020-11-02 NOTE — Telephone Encounter (Signed)
Copied from CRM [id number]. Topic: General - Other >> Nov 02, 2020 12:30 PM Gaetana Michaelis A wrote: Reason for CRM: The patient has called for an update on paperwork submitted by their employer for completion   The patient would like to know if/when it is ready  Please contact further when possible >> Nov 02, 2020  1:08 PM Pawlus, Maxine Glenn A wrote: Pt called in for an update regarding her paperwork. Pt also stated she wanted to discuss her pain issues.

## 2020-11-06 NOTE — Telephone Encounter (Signed)
Paperwork as been picked up by patient.

## 2020-11-23 ENCOUNTER — Telehealth: Payer: Self-pay | Admitting: *Deleted

## 2020-11-23 NOTE — Telephone Encounter (Signed)
Called pt and she let me know that the paperwork is already handled.

## 2020-11-23 NOTE — Telephone Encounter (Signed)
Copied from CRM [id number]. Topic: General - Inquiry >> Oct 19, 2020  9:41 AM Sarah Evans, [name] wrote: Reason for CRM: Patient called and would like to speak with Dr. Delford Field or his CMA about filing out paper work for her since her condition has changed. Please call patient back, thanks.

## 2020-12-14 ENCOUNTER — Ambulatory Visit: Payer: Medicaid Other | Admitting: Obstetrics and Gynecology

## 2020-12-17 ENCOUNTER — Other Ambulatory Visit: Payer: Self-pay

## 2020-12-17 ENCOUNTER — Telehealth: Payer: Self-pay | Admitting: Critical Care Medicine

## 2020-12-17 NOTE — Telephone Encounter (Signed)
Requested medications are due for refill today.  no  Requested medications are on the active medications list.  no  Last refill. 08/05/2020  Future visit scheduled.   no  Notes to clinic.  Medication not assigned a protocol. Medication was discontinued by Roxy Horseman on 09/19/2020. Rx was signed by Roxy Horseman.    Requested Prescriptions  Pending Prescriptions Disp Refills   cefdinir (OMNICEF) 300 MG capsule 14 capsule 0    Sig: Take 1 capsule (300 mg total) by mouth 2 (two) times daily.     Off-Protocol Failed - 12/17/2020  2:45 PM      Failed - Medication not assigned to a protocol, review manually.      Passed - Valid encounter within last 12 months    Recent Outpatient Visits           1 month ago History of ischemic stroke   [facility] Storm Frisk, MD   1 month ago Chronic thoracic back pain, unspecified back pain laterality   [facility] Mayers, Cari S, [place]   7 months ago Hyperlipidemia associated with type 2 diabetes mellitus [facility])   [facility] Storm Frisk, MD   8 months ago Muscle spasticity   [facility] Storm Frisk, MD   2 years ago Ear pain, bilateral   [facility] Claiborne Rigg, NP

## 2020-12-20 ENCOUNTER — Other Ambulatory Visit: Payer: Self-pay

## 2020-12-21 ENCOUNTER — Ambulatory Visit: Payer: Self-pay

## 2020-12-21 NOTE — Telephone Encounter (Signed)
°  Chief Complaint: stye  Symptoms: stye R upper eyelid, redness, swelling Frequency: 7 days Pertinent Negatives: Patient denies fever Disposition: [] ED /[] Urgent Care (no appt availability in office) / [] Appointment(In office/virtual)/ [x]  [facility]/ [] Home Care/ [] Refused Recommended Disposition  Additional Notes: pt going to do virtual UC visit tomorrow and keep her appt for 12/25/20    Summary: Stye on Eye   Pt calling in stating that she has a stye on her right eye since 12/9, seeking clinical advise about what medication to take.      Reason for Disposition  [1] Eyelid is very swollen AND [2] no fever  Answer Assessment - Initial Assessment Questions 1. LOCATION: "Which eye has the sty?" "Upper or lower eyelid?"     R eyelid, upper 2. SIZE: "How big is it?" (Note: standard pencil eraser is 6 mm)     75mm 3. EYELID: "Is the eyelid swollen?" If Yes, ask: "How much?"     Swollen 4. REDNESS: "Has the redness spread onto the eyelid?"     yes 5. ONSET: "When did you notice the sty?"     12/14/20 6. VISION: "Do you have blurred vision?"      no 7. PAIN: "Is it painful?" If Yes, ask: "How bad is the pain?"  (Scale 1-10; or mild, moderate, severe)     9 8. CONTACTS: "Do you wear contacts?"     No  Protocols used: Sty-A-AH

## 2020-12-21 NOTE — Telephone Encounter (Signed)
Pt calling in checking on the status of her medication  cefdinir (OMNICEF) requesting a call back  [phone].

## 2020-12-21 NOTE — Telephone Encounter (Signed)
Explained to Sarah Evans that an OV would be necessary to prescribe ATB's. She last had Omnicef in July and September.  Scheduled an appt for evaluation.  Patient preferred virtual apt.  Apt scheduled with provider Tanda Rockers, NP

## 2020-12-21 NOTE — Telephone Encounter (Signed)
Patient called in trying to find out, why this med , cefdinir (OMNICEF) 300 MG capsulerefill was refused? Just says not appropiate

## 2020-12-24 ENCOUNTER — Ambulatory Visit (INDEPENDENT_AMBULATORY_CARE_PROVIDER_SITE_OTHER): Payer: No Payment, Other | Admitting: Licensed Clinical Social Worker

## 2020-12-24 DIAGNOSIS — F250 Schizoaffective disorder, bipolar type: Secondary | ICD-10-CM | POA: Diagnosis not present

## 2020-12-25 ENCOUNTER — Other Ambulatory Visit: Payer: Self-pay

## 2020-12-25 ENCOUNTER — Telehealth: Payer: Self-pay | Admitting: Nurse Practitioner

## 2020-12-25 ENCOUNTER — Telehealth: Payer: Medicaid Other | Admitting: Nurse Practitioner

## 2020-12-25 NOTE — Telephone Encounter (Signed)
Called patient x3 attempts with no answer. Left message for her to call back to reschedule.
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
## 2021-01-08 ENCOUNTER — Telehealth: Payer: Medicaid Other | Admitting: Physician Assistant

## 2021-01-08 ENCOUNTER — Other Ambulatory Visit: Payer: Self-pay

## 2021-01-08 DIAGNOSIS — H00011 Hordeolum externum right upper eyelid: Secondary | ICD-10-CM

## 2021-01-08 DIAGNOSIS — H9202 Otalgia, left ear: Secondary | ICD-10-CM

## 2021-01-08 MED ORDER — IPRATROPIUM BROMIDE 0.03 % NA SOLN
2.0000 | Freq: Two times a day (BID) | NASAL | 0 refills | Status: DC
Start: 2021-01-08 — End: 2021-07-24
  Filled 2021-01-08 – 2021-05-10 (×3): qty 30, 75d supply, fill #0

## 2021-01-08 MED ORDER — ERYTHROMYCIN 5 MG/GM OP OINT
1.0000 "application " | TOPICAL_OINTMENT | Freq: Every day | OPHTHALMIC | 0 refills | Status: DC
  Filled 2021-01-08 – 2021-01-14 (×2): qty 3.5, 3d supply, fill #0
  Filled 2021-05-10: qty 3.5, 30d supply, fill #0

## 2021-01-08 MED ORDER — CEFDINIR 300 MG PO CAPS
300.0000 mg | ORAL_CAPSULE | Freq: Two times a day (BID) | ORAL | 0 refills | Status: DC
Start: 2021-01-08 — End: 2021-06-20
  Filled 2021-01-08 – 2021-01-14 (×2): qty 20, 10d supply, fill #0

## 2021-01-08 NOTE — Patient Instructions (Signed)
Sarah Evans, thank you for joining Margaretann Loveless, PA-C for today's virtual visit.  While this provider is not your primary care provider (PCP), if your PCP is located in our provider database this encounter information will be shared with them immediately following your visit.  Consent: (Patient) Sarah Evans provided verbal consent for this virtual visit at the beginning of the encounter.  Current Medications:  Current Outpatient Medications:    cefdinir (OMNICEF) 300 MG capsule, Take 1 capsule (300 mg total) by mouth 2 (two) times daily., Disp: 20 capsule, Rfl: 0   erythromycin ophthalmic ointment, Place 1 application into the right eye at bedtime., Disp: 3.5 g, Rfl: 0   ipratropium (ATROVENT) 0.03 % nasal spray, Place 2 sprays into both nostrils every 12 (twelve) hours., Disp: 30 mL, Rfl: 0   atorvastatin (LIPITOR) 10 MG tablet, Take 1 tablet (10 mg total) by mouth daily., Disp: 90 tablet, Rfl: 3   cholecalciferol (VITAMIN D3) 25 MCG (1000 UNIT) tablet, Take 2,000 Units by mouth daily. (Patient not taking: Reported on 11/01/2020), Disp: , Rfl:    clobetasol cream (TEMOVATE) 0.05 %, Apply 1 application topically 2 (two) times daily., Disp: 30 g, Rfl: 0   FLUoxetine (PROZAC) 20 MG capsule, Take 1 capsule (20 mg total) by mouth daily., Disp: 30 capsule, Rfl: 3   lamoTRIgine (LAMICTAL) 25 MG tablet, Take 3 tablets (75 mg total) by mouth daily., Disp: 120 tablet, Rfl: 2   metFORMIN (GLUCOPHAGE) 500 MG tablet, Take 1 tablet (500 mg total) by mouth daily with breakfast., Disp: 60 tablet, Rfl: 3   Multiple Vitamin (MULTIVITAMIN WITH MINERALS) TABS tablet, Take 1 tablet by mouth daily. (Patient not taking: Reported on 11/01/2020), Disp: , Rfl:    norgestimate-ethinyl estradiol (MILI) 0.25-35 MG-MCG tablet, Take 1 tablet by mouth daily., Disp: 28 tablet, Rfl: 11   pantoprazole (PROTONIX) 20 MG tablet, Take 1 tablet (20 mg total) by mouth daily., Disp: 30 tablet, Rfl: 1   prazosin (MINIPRESS)  1 MG capsule, Take 1 capsule (1 mg total) by mouth at bedtime., Disp: 30 capsule, Rfl: 3   QUEtiapine (SEROQUEL XR) 200 MG 24 hr tablet, Take 1 tablet (200 mg total) by mouth at bedtime., Disp: 30 tablet, Rfl: 3   tizanidine (ZANAFLEX) 6 MG capsule, Take 1 capsule (6 mg total) by mouth 3 (three) times daily as needed for muscle spasms. (Patient not taking: Reported on 11/01/2020), Disp: 90 capsule, Rfl: 1   valACYclovir (VALTREX) 1000 MG tablet, Take 1 tablet (1,000 mg total) by mouth daily., Disp: 60 tablet, Rfl: 2   Medications ordered in this encounter:  Meds ordered this encounter  Medications   cefdinir (OMNICEF) 300 MG capsule    Sig: Take 1 capsule (300 mg total) by mouth 2 (two) times daily.    Dispense:  20 capsule    Refill:  0    Order Specific Question:   Supervising Provider    Answer:   MILLER, BRIAN [[id number]]   ipratropium (ATROVENT) 0.03 % nasal spray    Sig: Place 2 sprays into both nostrils every 12 (twelve) hours.    Dispense:  30 mL    Refill:  0    Order Specific Question:   Supervising Provider    Answer:   Hyacinth Meeker, BRIAN [[id number]]   erythromycin ophthalmic ointment    Sig: Place 1 application into the right eye at bedtime.    Dispense:  3.5 g    Refill:  0    Order Specific  Question:   Supervising Provider    Answer:   Eber HongMILLER, BRIAN [[id number]]     *If you need refills on other medications prior to your next appointment, please contact your pharmacy*  Follow-Up: Call back or seek an in-person evaluation if the symptoms worsen or if the condition fails to improve as anticipated.  Other Instructions Eustachian Tube Dysfunction Eustachian tube dysfunction refers to a condition in which a blockage develops in the narrow passage that connects the middle ear to the back of the nose (eustachian tube). The eustachian tube regulates air pressure in the middle ear by letting air move between the ear and nose. It also helps to drain fluid from the middle ear space. Eustachian tube  dysfunction can affect one or both ears. When the eustachian tube does not function properly, air pressure, fluid, or both can build up in the middle ear. What are the causes? This condition occurs when the eustachian tube becomes blocked or cannot open normally. Common causes of this condition include: Ear infections. Colds and other infections that affect the nose, mouth, and throat (upper respiratory tract). Allergies. Irritation from cigarette smoke. Irritation from stomach acid coming up into the esophagus (gastroesophageal reflux). The esophagus is the part of the body that moves food from the mouth to the stomach. Sudden changes in air pressure, such as from descending in an airplane or scuba diving. Abnormal growths in the nose or throat, such as: Growths that line the nose (nasal polyps). Abnormal growth of cells (tumors). Enlarged tissue at the back of the throat (adenoids). What increases the risk? You are more likely to develop this condition if: You smoke. You are overweight. You are a child who has: Certain birth defects of the mouth, such as cleft palate. Large tonsils or adenoids. What are the signs or symptoms? Common symptoms of this condition include: A feeling of fullness in the ear. Ear pain. Clicking or popping noises in the ear. Ringing in the ear (tinnitus). Hearing loss. Loss of balance. Dizziness. Symptoms may get worse when the air pressure around you changes, such as when you travel to an area of high elevation, fly on an airplane, or go scuba diving. How is this diagnosed? This condition may be diagnosed based on: Your symptoms. A physical exam of your ears, nose, and throat. Tests, such as those that measure: The movement of your eardrum. Your hearing (audiometry). How is this treated? Treatment depends on the cause and severity of your condition. In mild cases, you may relieve your symptoms by moving air into your ears. This is called "popping the  ears." In more severe cases, or if you have symptoms of fluid in your ears, treatment may include: Medicines to relieve congestion (decongestants). Medicines that treat allergies (antihistamines). Nasal sprays or ear drops that contain medicines that reduce swelling (steroids). A procedure to drain the fluid in your eardrum. In this procedure, a small tube may be placed in the eardrum to: Drain the fluid. Restore the air in the middle ear space. A procedure to insert a balloon device through the nose to inflate the opening of the eustachian tube (balloon dilation). Follow these instructions at home: Lifestyle Do not do any of the following until your health care provider approves: Travel to high altitudes. Fly in airplanes. Work in a Estate agentpressurized cabin or room. Scuba dive. Do not use any products that contain nicotine or tobacco. These products include cigarettes, chewing tobacco, and vaping devices, such as e-cigarettes. If you need help quitting,  ask your health care provider. Keep your ears dry. Wear fitted earplugs during showering and bathing. Dry your ears completely after. General instructions Take over-the-counter and prescription medicines only as told by your health care provider. Use techniques to help pop your ears as recommended by your health care provider. These may include: Chewing gum. Yawning. Frequent, forceful swallowing. Closing your mouth, holding your nose closed, and gently blowing as if you are trying to blow air out of your nose. Keep all follow-up visits. This is important. Contact a health care provider if: Your symptoms do not go away after treatment. Your symptoms come back after treatment. You are unable to pop your ears. You have: A fever. Pain in your ear. Pain in your head or neck. Fluid draining from your ear. Your hearing suddenly changes. You become very dizzy. You lose your balance. Get help right away if: You have a sudden, severe increase  in any of your symptoms. Summary Eustachian tube dysfunction refers to a condition in which a blockage develops in the eustachian tube. It can be caused by ear infections, allergies, inhaled irritants, or abnormal growths in the nose or throat. Symptoms may include ear pain or fullness, hearing loss, or ringing in the ears. Mild cases are treated with techniques to unblock the ears, such as yawning or chewing gum. More severe cases are treated with medicines or procedures. This information is not intended to replace advice given to you by your health care provider. Make sure you discuss any questions you have with your health care provider. Document Revised: 03/05/2020 Document Reviewed: 03/05/2020 Elsevier Patient Education  2022 Elsevier Inc.   Stye A stye, also known as a hordeolum, is a bump that forms on an eyelid. It may look like a pimple next to the eyelash. A stye can form inside the eyelid (internal stye) or outside the eyelid (external stye). A stye can cause redness, swelling, and pain on the eyelid. Styes are very common. Anyone can get them at any age. They usually occur in just one eye at a time, but you may have more than one in either eye. What are the causes? A stye is caused by an infection. The infection is almost always caused by bacteria called Staphylococcus aureus. This is a common type of bacteria that lives on the skin. An internal stye may result from an infected oil-producing gland inside the eyelid. An external stye may be caused by an infection at the base of the eyelash (hair follicle). What increases the risk? You are more likely to develop a stye if: You have had a stye before. You have any of these conditions: Red, itchy, inflamed eyelids (blepharitis). A skin condition such as seborrheic dermatitis or rosacea. High fat levels in your blood (lipids). Dry eyes. What are the signs or symptoms? The most common symptom of a stye is eyelid pain. Internal styes  are more painful than external styes. Other symptoms may include: Painful swelling of your eyelid. A scratchy feeling in your eye. Tearing and redness of your eye. A pimple-like bump on the edge of the eyelid. Pus draining from the stye. How is this diagnosed? Your health care provider may be able to diagnose a stye just by examining your eye. The health care provider may also check to make sure: You do not have a fever or other signs of a more serious infection. The infection has not spread to other parts of your eye or areas around your eye. How is this treated?  Most styes will clear up in a few days without treatment or with warm compresses applied to the area. You may need to use antibiotic drops or ointment to treat an infection. Sometimes, steroid drops or ointment are used in addition to antibiotics. In some cases, your health care provider may give you a small steroid injection in the eyelid. If your stye does not heal with routine treatment, your health care provider may drain pus from the stye using a thin blade or needle. This may be done if the stye is large, causing a lot of pain, or affecting your vision. Follow these instructions at home: Take over-the-counter and prescription medicines only as told by your health care provider. This includes eye drops or ointments. If you were prescribed an antibiotic medicine, steroid medicine, or both, apply or use them as told by your health care provider. Do not stop using the medicine even if your condition improves. Apply a warm, wet cloth (warm compress) to your eye for 5-10 minutes, 4 to 6 times a day. Clean the affected eyelid as directed by your health care provider. Do not wear contact lenses or eye makeup until your stye has healed and your health care provider says that it is safe. Do not try to pop or drain the stye. Do not rub your eye. Contact a health care provider if: You have chills or a fever. Your stye does not go away  after several days. Your stye affects your vision. Your eyeball becomes swollen, red, or painful. Get help right away if: You have pain when moving your eye around. Summary A stye is a bump that forms on an eyelid. It may look like a pimple next to the eyelash. A stye can form inside the eyelid (internal stye) or outside the eyelid (external stye). A stye can cause redness, swelling, and pain on the eyelid. Your health care provider may be able to diagnose a stye just by examining your eye. Apply a warm, wet cloth (warm compress) to your eye for 5-10 minutes, 4 to 6 times a day. This information is not intended to replace advice given to you by your health care provider. Make sure you discuss any questions you have with your health care provider. Document Revised: 02/29/2020 Document Reviewed: 02/29/2020 Elsevier Patient Education  2022 [employer]    If you have been instructed to have an in-person evaluation today at a local Urgent Care facility, please use the link below. It will take you to a list of all of our available [facility], including address, phone number and hours of operation. Please do not delay care.  [facility]  If you or a family member do not have a primary care provider, use the link below to schedule a visit and establish care. When you choose a [facility] primary care physician or advanced practice provider, you gain a long-term partner in health. Find a Primary Care Provider  Learn more about [facility]'s in-office and virtual care options:  - Get Care Now

## 2021-01-08 NOTE — Progress Notes (Signed)
Virtual Visit Consent   Sarah Evans, you are scheduled for a virtual visit with a [facility] provider today.     Just as with appointments in the office, your consent must be obtained to participate.  Your consent will be active for this visit and any virtual visit you may have with one of our providers in the next 365 days.     If you have a MyChart account, a copy of this consent can be sent to you electronically.  All virtual visits are billed to your insurance company just like a traditional visit in the office.    As this is a virtual visit, video technology does not allow for your provider to perform a traditional examination.  This may limit your provider's ability to fully assess your condition.  If your provider identifies any concerns that need to be evaluated in person or the need to arrange testing (such as labs, EKG, etc.), we will make arrangements to do so.     Although advances in technology are sophisticated, we cannot ensure that it will always work on either your end or our end.  If the connection with a video visit is poor, the visit may have to be switched to a telephone visit.  With either a video or telephone visit, we are not always able to ensure that we have a secure connection.     I need to obtain your verbal consent now.   Are you willing to proceed with your visit today?    [name] has provided verbal consent on 01/08/2021 for a virtual visit (video or telephone).   Margaretann Loveless, PA-C   Date: 01/08/2021 3:43 PM   Virtual Visit via Video Note   I, Margaretann Loveless, connected with  Sarah Evans  ([id number], 11/29/1980) on 01/08/21 at  3:30 PM EST by a video-enabled telemedicine application and verified that I am speaking with the correct person using two identifiers.  Location: Patient: Virtual Visit Location Patient: Home Provider: Virtual Visit Location Provider: Home Office   I discussed the limitations of evaluation and management by  telemedicine and the availability of in person appointments. The patient expressed understanding and agreed to proceed.    History of Present Illness: Sarah Evans is a 41 y.o. who identifies as a female who was assigned female at birth, and is being seen today for ear pain and possible stye on left eye.  HPI: Otalgia  There is pain in the left ear. This is a recurrent (Was seen in July 2022 for same issue) problem. The current episode started in the past 7 days. The problem occurs constantly. The problem has been gradually worsening. There has been no fever. The pain is moderate. Associated symptoms include coughing, headaches, rhinorrhea and a sore throat. She has tried nothing for the symptoms. The treatment provided no relief.  Eye Problem  The right eye is affected. This is a new problem. The current episode started in the past 7 days. The problem occurs constantly. The problem has been unchanged. The pain is mild. There is No known exposure to pink eye. She Does not wear contacts. Associated symptoms include a foreign body sensation and a recent URI. Pertinent negatives include no blurred vision, eye discharge, double vision, eye redness, fever or photophobia. Associated symptoms comments: Stye in right upper lid. She has tried eye drops for the symptoms. The treatment provided no relief.     Problems:  Patient Active Problem List  Diagnosis Date Noted   Chronic neck pain 11/01/2020   Chronic thoracic spine pain 10/25/2020   GERD without esophagitis 05/27/2020   History of ischemic stroke 05/27/2020   Numbness and tingling 05/24/2020   Ischemic stroke (HCC) 05/23/2020   BMI 29.0-29.9,adult 05/21/2020   HSV (herpes simplex virus) anogenital infection 05/21/2020   Prediabetes 04/12/2020   Intrinsic eczema 04/12/2020   Mixed hyperlipidemia 04/12/2020   Muscle spasticity 04/12/2020   Moderate episode of recurrent major depressive disorder (HCC) 06/09/2019   Generalized anxiety disorder  06/09/2019   Bipolar disorder, current episode depressed, moderate (HCC) 04/28/2018   Personal history of nonsuicidal self-harm 04/28/2018   PTSD (post-traumatic stress disorder) 04/30/2015   Schizoaffective disorder, bipolar type (HCC) 04/27/2015    Allergies: No Known Allergies Medications:  Current Outpatient Medications:    cefdinir (OMNICEF) 300 MG capsule, Take 1 capsule (300 mg total) by mouth 2 (two) times daily., Disp: 20 capsule, Rfl: 0   erythromycin ophthalmic ointment, Place 1 application into the right eye at bedtime., Disp: 3.5 g, Rfl: 0   ipratropium (ATROVENT) 0.03 % nasal spray, Place 2 sprays into both nostrils every 12 (twelve) hours., Disp: 30 mL, Rfl: 0   atorvastatin (LIPITOR) 10 MG tablet, Take 1 tablet (10 mg total) by mouth daily., Disp: 90 tablet, Rfl: 3   cholecalciferol (VITAMIN D3) 25 MCG (1000 UNIT) tablet, Take 2,000 Units by mouth daily. (Patient not taking: Reported on 11/01/2020), Disp: , Rfl:    clobetasol cream (TEMOVATE) 0.05 %, Apply 1 application topically 2 (two) times daily., Disp: 30 g, Rfl: 0   FLUoxetine (PROZAC) 20 MG capsule, Take 1 capsule (20 mg total) by mouth daily., Disp: 30 capsule, Rfl: 3   lamoTRIgine (LAMICTAL) 25 MG tablet, Take 3 tablets (75 mg total) by mouth daily., Disp: 120 tablet, Rfl: 2   metFORMIN (GLUCOPHAGE) 500 MG tablet, Take 1 tablet (500 mg total) by mouth daily with breakfast., Disp: 60 tablet, Rfl: 3   Multiple Vitamin (MULTIVITAMIN WITH MINERALS) TABS tablet, Take 1 tablet by mouth daily. (Patient not taking: Reported on 11/01/2020), Disp: , Rfl:    norgestimate-ethinyl estradiol (MILI) 0.25-35 MG-MCG tablet, Take 1 tablet by mouth daily., Disp: 28 tablet, Rfl: 11   pantoprazole (PROTONIX) 20 MG tablet, Take 1 tablet (20 mg total) by mouth daily., Disp: 30 tablet, Rfl: 1   prazosin (MINIPRESS) 1 MG capsule, Take 1 capsule (1 mg total) by mouth at bedtime., Disp: 30 capsule, Rfl: 3   QUEtiapine (SEROQUEL XR) 200 MG 24 hr  tablet, Take 1 tablet (200 mg total) by mouth at bedtime., Disp: 30 tablet, Rfl: 3   tizanidine (ZANAFLEX) 6 MG capsule, Take 1 capsule (6 mg total) by mouth 3 (three) times daily as needed for muscle spasms. (Patient not taking: Reported on 11/01/2020), Disp: 90 capsule, Rfl: 1   valACYclovir (VALTREX) 1000 MG tablet, Take 1 tablet (1,000 mg total) by mouth daily., Disp: 60 tablet, Rfl: 2  Observations/Objective: Patient is well-developed, well-nourished in no acute distress.  Resting comfortably  at home.  Head is normocephalic, atraumatic.  No labored breathing.  Speech is clear and coherent with logical content.  Patient is alert and oriented at baseline.    Assessment and Plan: 1. Left ear pain - cefdinir (OMNICEF) 300 MG capsule; Take 1 capsule (300 mg total) by mouth 2 (two) times daily.  Dispense: 20 capsule; Refill: 0 - ipratropium (ATROVENT) 0.03 % nasal spray; Place 2 sprays into both nostrils every 12 (twelve) hours.  Dispense:  30 mL; Refill: 0  2. Hordeolum externum of right upper eyelid - erythromycin ophthalmic ointment; Place 1 application into the right eye at bedtime.  Dispense: 3.5 g; Refill: 0  - URI symptoms with acute left ear pain and "fluttering" - Cefdinir prescribed - Ipratropium for nasal congestion and drainage - Erythromycin ointment for long standing stye - Advised if stye does not improve she needs to be seen in person and possibly referred to an eye doctor - Seek in person evaluation if any symptoms worsen or fail to improve  Follow Up Instructions: I discussed the assessment and treatment plan with the patient. The patient was provided an opportunity to ask questions and all were answered. The patient agreed with the plan and demonstrated an understanding of the instructions.  A copy of instructions were sent to the patient via MyChart unless otherwise noted below.   The patient was advised to call back or seek an in-person evaluation if the symptoms  worsen or if the condition fails to improve as anticipated.  Time:  I spent 13 minutes with the patient via telehealth technology discussing the above problems/concerns.    Margaretann LovelessJennifer M Bassheva Flury, PA-C

## 2021-01-09 ENCOUNTER — Other Ambulatory Visit: Payer: Self-pay

## 2021-01-14 ENCOUNTER — Other Ambulatory Visit: Payer: Self-pay

## 2021-01-21 ENCOUNTER — Telehealth (INDEPENDENT_AMBULATORY_CARE_PROVIDER_SITE_OTHER): Payer: No Payment, Other | Admitting: Psychiatry

## 2021-01-21 ENCOUNTER — Encounter (HOSPITAL_COMMUNITY): Payer: Self-pay | Admitting: Psychiatry

## 2021-01-21 ENCOUNTER — Other Ambulatory Visit: Payer: Self-pay

## 2021-01-21 DIAGNOSIS — F4310 Post-traumatic stress disorder, unspecified: Secondary | ICD-10-CM

## 2021-01-21 DIAGNOSIS — F250 Schizoaffective disorder, bipolar type: Secondary | ICD-10-CM

## 2021-01-21 MED ORDER — PRAZOSIN HCL 2 MG PO CAPS
2.0000 mg | ORAL_CAPSULE | Freq: Every day | ORAL | 3 refills | Status: DC
  Filled 2021-01-21: qty 30, 30d supply, fill #0

## 2021-01-21 MED ORDER — QUETIAPINE FUMARATE ER 50 MG PO TB24
100.0000 mg | ORAL_TABLET | Freq: Every day | ORAL | 3 refills | Status: DC
Start: 2021-01-21 — End: 2021-05-01
  Filled 2021-01-21 – 2021-02-06 (×2): qty 60, 30d supply, fill #0
  Filled 2021-03-28: qty 6, 3d supply, fill #1
  Filled 2021-03-28: qty 54, 27d supply, fill #1
  Filled 2021-04-19: qty 60, 30d supply, fill #2

## 2021-01-21 NOTE — Progress Notes (Signed)
BH MD/PA/NP OP Progress Note Virtual Visit via Video Note  I connected with Sarah Evans on 01/21/21 at  1:00 PM EST by a video enabled telemedicine application and verified that I am speaking with the correct person using two identifiers.  Location: Patient: Home Provider: Clinic   I discussed the limitations of evaluation and management by telemedicine and the availability of in person appointments. The patient expressed understanding and agreed to proceed.  I provided 30 minutes of non-face-to-face time during this encounter.          01/21/2021 1:32 PM Sarah Evans  MRN:  [id number]  Chief Complaint: "Mentally its hard but I'm learning howe to deal with my bipolar and schizophrenia"   HPI: 41 year old female seen today for follow up psychiatric evaluation. She has psychiatric history of schizoaffective disorder, bipolar 1, depression, anxiety, and PTSD. She is currently managed on Prozac 10 mg daily, Prazosin 1 mg nightly, Lamictal 100 mg daily, Seroquel 150 mg nightly, and hydroxyzine 25 mg 3 times a day. She notes her medications are somewhat effective in managing her symptoms. Patient notes that she no longer takes Seroquel 200 mg but instead notes that she cuts her dose in half to 100 mg.  Today she was pleasant, cooperative, engaged in conversation, and maintains eye contact.  She informed Clinical research associate that she finds Lamictal and Prozac ineffective and notes that she would like to discontinue it.  She does Archivist that she was taking Lamictal 100 mg.  She endorses symptoms of hypomania such as being distractible, irritable, having fluctuations in mood, and racing thoughts.  Since her last visit her anxiety and depression has somewhat improved.  Patient however notes that she continues to be worried about her health and frequent nightmares.  She informed Clinical research associate that over the last few months she has been having nightmares at least 5 times a week that has been waking her up out of  her sleep.  She does endorse sleeping 7 to 8 hours.   Patient also with above exacerbates her anxiety and depression.  Today provider conducted a GAD-7 and patient scored 15, at last visit she scored a 21.  Provider also conducted PHQ-9 and patient scored a 20, her last visit she scored 27.  She endorses adequate appetite.  Today she endorses passive SI however notes that she would not harm herself.  She denies SI/HI or paranoia.  Patient informed Clinical research associate that she continues to have left-sided weakness from a stroke.  She reports having numbness and tingling in her arm/legs and burning in her toes.  She notes that she will be following up with her primary care doctor soon to discuss her continued condition.  Patient request that Seroquel be reduced to 100 as she has been cutting her 200 mg pills in half.  She also notes that she would like to discontinue Prozac and Lamictal because she finds them ineffective.  Provider encouraged patient to taper her dose of Lamictal down over the next week.  She endorsed understanding and agreed.  Prazosin increased to 2 mg to help manage nightmares.  Patient notes that she may be interested in a long-acting injectable to help manage her conditions.  Provider discussed starting Invega.  She notes that at this time she is not ready but will consider in the near future.  No other concerns noted this time.      Visit Diagnosis:    ICD-10-CM   1. PTSD (post-traumatic stress disorder)  F43.10 prazosin (MINIPRESS) 2  MG capsule    2. Schizoaffective disorder, bipolar type (HCC)  F25.0 QUEtiapine (SEROQUEL XR) 50 MG TB24 24 hr tablet      Past Psychiatric History: Schizoaffective disorder, Bipolar 1, Depression, Anxiety, and PTSD  Past Medical History:  Past Medical History:  Diagnosis Date   Bipolar 1 disorder (HCC)    Herpes simplex virus (HSV) infection    dx 2001   Polycystic ovary disease    Schizophrenia (HCC)    Stroke [facility])     Past Surgical History:   Procedure Laterality Date   BUBBLE STUDY  05/25/2020   Procedure: BUBBLE STUDY;  Surgeon: Chrystie Nose, MD;  Location: [facility] ENDOSCOPY;  Service: Cardiovascular;;   NO PAST SURGERIES     TEE WITHOUT CARDIOVERSION N/A 05/25/2020   Procedure: TRANSESOPHAGEAL ECHOCARDIOGRAM (TEE);  Surgeon: Chrystie Nose, MD;  Location: [facility] ENDOSCOPY;  Service: Cardiovascular;  Laterality: N/A;    Family Psychiatric History: Bipolar, Schizophrenia, and Depressed. Father Bipolar disorder.   Family History:  Family History  Problem Relation Age of Onset   Bipolar disorder Mother    Diabetes Maternal Grandmother    Hypertension Neg Hx    Heart disease Neg Hx    Cancer Neg Hx     Social History:  Social History   Socioeconomic History   Marital status: Single    Spouse name: Not on file   Number of children: Not on file   Years of education: Not on file   Highest education level: Not on file  Occupational History   Not on file  Tobacco Use   Smoking status: Former    Packs/day: 0.25    Years: 15.00    Pack years: 3.75    Types: Cigarettes    Quit date: 05/02/2020    Years since quitting: 0.7   Smokeless tobacco: Never  Vaping Use   Vaping Use: Never used  Substance and Sexual Activity   Alcohol use: No    Comment: occ   Drug use: No   Sexual activity: Not Currently    Birth control/protection: Abstinence  Other Topics Concern   Not on file  Social History Narrative   Not on file   Social Determinants of Health   Financial Resource Strain: Not on file  Food Insecurity: Not on file  Transportation Needs: Not on file  Physical Activity: Not on file  Stress: Not on file  Social Connections: Not on file    Allergies: No Known Allergies  Metabolic Disorder Labs: Lab Results  Component Value Date   HGBA1C 5.7 (H) 06/15/2020   MPG 117 06/15/2020   MPG 128.37 05/24/2020   Lab Results  Component Value Date   PROLACTIN 13.0 04/28/2015   Lab Results  Component Value Date    CHOL 143 05/24/2020   TRIG 142 05/24/2020   HDL 52 05/24/2020   CHOLHDL 2.8 05/24/2020   VLDL 28 05/24/2020   LDLCALC 63 05/24/2020   LDLCALC 78 05/21/2020   Lab Results  Component Value Date   TSH 2.110 04/02/2020   TSH 2.040 10/26/2018    Therapeutic Level Labs: No results found for: LITHIUM No results found for: VALPROATE No components found for:  CBMZ  Current Medications: Current Outpatient Medications  Medication Sig Dispense Refill   atorvastatin (LIPITOR) 10 MG tablet Take 1 tablet (10 mg total) by mouth daily. 90 tablet 3   cefdinir (OMNICEF) 300 MG capsule Take 1 capsule (300 mg total) by mouth 2 (two) times daily. 20 capsule 0  cholecalciferol (VITAMIN D3) 25 MCG (1000 UNIT) tablet Take 2,000 Units by mouth daily. (Patient not taking: Reported on 11/01/2020)     clobetasol cream (TEMOVATE) 0.05 % Apply 1 application topically 2 (two) times daily. 30 g 0   erythromycin ophthalmic ointment Place 1 application into the right eye at bedtime. 3.5 g 0   FLUoxetine (PROZAC) 20 MG capsule Take 1 capsule (20 mg total) by mouth daily. 30 capsule 3   ipratropium (ATROVENT) 0.03 % nasal spray Place 2 sprays into both nostrils every 12 (twelve) hours. 30 mL 0   lamoTRIgine (LAMICTAL) 25 MG tablet Take 3 tablets (75 mg total) by mouth daily. 120 tablet 2   metFORMIN (GLUCOPHAGE) 500 MG tablet Take 1 tablet (500 mg total) by mouth daily with breakfast. 60 tablet 3   Multiple Vitamin (MULTIVITAMIN WITH MINERALS) TABS tablet Take 1 tablet by mouth daily. (Patient not taking: Reported on 11/01/2020)     norgestimate-ethinyl estradiol (MILI) 0.25-35 MG-MCG tablet Take 1 tablet by mouth daily. 28 tablet 11   pantoprazole (PROTONIX) 20 MG tablet Take 1 tablet (20 mg total) by mouth daily. 30 tablet 1   prazosin (MINIPRESS) 2 MG capsule Take 1 capsule (2 mg total) by mouth at bedtime. 30 capsule 3   QUEtiapine (SEROQUEL XR) 50 MG TB24 24 hr tablet Take 2 tablets (100 mg total) by mouth at  bedtime. 60 tablet 3   tizanidine (ZANAFLEX) 6 MG capsule Take 1 capsule (6 mg total) by mouth 3 (three) times daily as needed for muscle spasms. (Patient not taking: Reported on 11/01/2020) 90 capsule 1   valACYclovir (VALTREX) 1000 MG tablet Take 1 tablet (1,000 mg total) by mouth daily. 60 tablet 2   No current facility-administered medications for this visit.     Musculoskeletal: Strength & Muscle Tone:  Unable to assess due to telephone visit Gait & Station:  Unable to assess due to telephone visit Patient leans: N/A  Psychiatric Specialty Exam: Review of Systems  There were no vitals taken for this visit.There is no height or weight on file to calculate BMI.  General Appearance:  Unable to assess due to telephone visit  Eye Contact:   Unable to assess due to telephone visit  Speech:  Clear and Coherent and Normal Rate  Volume:  Normal  Mood:  Anxious and Depressed  Affect:  Congruent  Thought Process:  Coherent, Goal Directed and Linear  Orientation:  Full (Time, Place, and Person)  Thought Content: Logical and Hallucinations: Auditory Visual   Suicidal Thoughts:  Yes.  without intent/plan  Homicidal Thoughts:  No  Memory:  Immediate;   Good Recent;   Good Remote;   Good  Judgement:  Good  Insight:  Good  Psychomotor Activity:  Normal  Concentration:  Concentration: Good and Attention Span: Good  Recall:  Good  Fund of Knowledge: Good  Language: Good  Akathisia:  No  Handed:  Right  AIMS (if indicated): Not done  Assets:  Communication Skills Desire for Improvement Housing Leisure Time Social Support  ADL's:  Intact  Cognition: WNL  Sleep:  Good   Screenings: AUDIT    Flowsheet Row [facility]  Alcohol Use Disorder Identification Test Final Score (AUDIT) 0      GAD-7    Flowsheet Row [facility]  [facility]  Total GAD-7 Score 15 21 21 16  0      PHQ2-9    Flowsheet Row Video [facility]20[facility] Counselor [facility]  PHQ-2 Total Score 6 6 2 6 6   PHQ-9 Total Score 20 24 20 27 24       Flowsheet Row [facility]  C-SSRS RISK CATEGORY No Risk Low Risk Error: Q7 should not be populated when Q6 is No        Assessment and Plan: Patient endorses reduced depression and anxiety but increased nightmares.  She reports that she finds Lamictal and Prozac ineffective and would like to discontinue it.  Patient request that Seroquel be reduced to 100 as she has been cutting her 200 mg pills in half.   Provider encouraged patient to taper her dose of Lamictal down over the next week.  She endorsed understanding and agreed.  Prazosin increased to 2 mg to help manage nightmares.  Patient notes that she may be interested in a long-acting injectable to help manage her conditions.  Provider discussed starting Invega.  She notes that at this time she is not ready but will consider in the near future.  1. PTSD (post-traumatic stress disorder)  Increase- prazosin (MINIPRESS) 2 MG capsule; Take 1 capsule (2 mg total) by mouth at  bedtime.  Dispense: 30 capsule; Refill: 3  2. Schizoaffective disorder, bipolar type (HCC)  Reduce- QUEtiapine (SEROQUEL XR) 50 MG TB24 24 hr tablet; Take 2 tablets (100 mg total) by mouth at bedtime.  Dispense: 60 tablet; Refill: 3    Follow up in 3 months Follow-up with therapy   Shanna CiscoBrittney E Akshar Starnes, NP 01/21/2021, 1:32 PM

## 2021-01-28 ENCOUNTER — Other Ambulatory Visit: Payer: Self-pay

## 2021-02-04 ENCOUNTER — Ambulatory Visit: Payer: Medicaid Other | Admitting: Obstetrics & Gynecology

## 2021-02-05 ENCOUNTER — Other Ambulatory Visit: Payer: Self-pay

## 2021-02-06 ENCOUNTER — Other Ambulatory Visit: Payer: Self-pay

## 2021-02-07 ENCOUNTER — Other Ambulatory Visit: Payer: Self-pay

## 2021-02-26 ENCOUNTER — Ambulatory Visit (HOSPITAL_COMMUNITY): Payer: No Payment, Other | Admitting: Licensed Clinical Social Worker

## 2021-02-26 ENCOUNTER — Telehealth (HOSPITAL_COMMUNITY): Payer: Self-pay | Admitting: Licensed Clinical Social Worker

## 2021-02-26 ENCOUNTER — Encounter (HOSPITAL_COMMUNITY): Payer: Self-pay

## 2021-02-26 NOTE — Telephone Encounter (Signed)
LCSW sent text message with link for video session per schedule. LCSW remained online available for session until 10:12am. Pt failed to sign on for session.

## 2021-03-11 ENCOUNTER — Ambulatory Visit (HOSPITAL_COMMUNITY): Payer: Self-pay | Admitting: Licensed Clinical Social Worker

## 2021-03-15 ENCOUNTER — Other Ambulatory Visit: Payer: Self-pay

## 2021-03-15 MED ORDER — SPIRONOLACTONE 100 MG PO TABS
ORAL_TABLET | ORAL | 0 refills | Status: DC
  Filled 2021-03-15: qty 60, 30d supply, fill #0

## 2021-03-22 ENCOUNTER — Other Ambulatory Visit: Payer: Self-pay

## 2021-03-26 ENCOUNTER — Telehealth: Payer: Self-pay | Admitting: Family Medicine

## 2021-03-26 DIAGNOSIS — E282 Polycystic ovarian syndrome: Secondary | ICD-10-CM

## 2021-03-26 NOTE — Telephone Encounter (Signed)
Patient called in stating her pharmacy had sent in a medication refill request but they have not heard back, I got her scheduled for her annual she missed in January and told her a nurse would reach back out to her about the refill requirements. She requested to be called back after 3pm Monday-Thursday or after 1pm on Friday.  ?

## 2021-03-28 ENCOUNTER — Other Ambulatory Visit: Payer: Self-pay

## 2021-03-28 NOTE — Telephone Encounter (Addendum)
Returned call to pt and she did not answer. Message left stating that I will call back tomorrow to discuss her concern.  ? ?3/24 1317 Called pt and she did not answer.  Message left on voicemail stating I am sorry I missed her. She may send a Mychart message regarding the prescription she is requesting. We will return to office on Monday and will address her request @ that time.  ? ?3/27  1205  Fax refill request for birth control pills located. Refill authorized x3 months per standing order and was e-prescribed. Pt has Annual Gyn appointment scheduled in office on 5/25.  ?

## 2021-03-29 ENCOUNTER — Other Ambulatory Visit: Payer: Self-pay

## 2021-04-01 ENCOUNTER — Telehealth (HOSPITAL_COMMUNITY): Payer: Self-pay | Admitting: Licensed Clinical Social Worker

## 2021-04-01 ENCOUNTER — Ambulatory Visit (HOSPITAL_COMMUNITY): Payer: No Payment, Other | Admitting: Licensed Clinical Social Worker

## 2021-04-01 ENCOUNTER — Encounter (HOSPITAL_COMMUNITY): Payer: Self-pay

## 2021-04-01 MED ORDER — NORGESTIMATE-ETH ESTRADIOL 0.25-35 MG-MCG PO TABS: 1.0000 | ORAL_TABLET | Freq: Every day | ORAL | 2 refills | Status: DC

## 2021-04-01 NOTE — Addendum Note (Signed)
Addended by: Jill Side on: 04/01/2021 12:10 PM ? ? Modules accepted: Orders ? ?

## 2021-04-01 NOTE — Telephone Encounter (Signed)
LCSW sent text message with link for video session per schedule. Pt logs on for session but is driving. She agrees to pull over. LCSW informs pt this clinician will not engage in session when a client is driving. Pt verbalizes understanding. LCSW informs pt of this clinician's resignation from [facility]. Acknowledged feelings r/t to change in care. Advised pt she will be assigned a new counselor going forward and admin staff will be letting her know who that is/getting her scheduled. Pt verbalizes understanding. LCSW and pt wish each other well. ?

## 2021-04-05 ENCOUNTER — Other Ambulatory Visit: Payer: Self-pay

## 2021-04-05 NOTE — Telephone Encounter (Signed)
Received fax requesting for refill on BCP's.  Pt has annual scheduled on 06/02/21.  BCP's e-prescribed on 04/01/21 per standing protocol for three months to cover until her annual visit.  ? ?Perseus Westall,RN  ?04/05/21 ?

## 2021-04-08 ENCOUNTER — Encounter: Payer: Self-pay | Admitting: Critical Care Medicine

## 2021-04-08 ENCOUNTER — Telehealth: Payer: Medicaid Other

## 2021-04-08 ENCOUNTER — Telehealth (HOSPITAL_COMMUNITY): Payer: No Payment, Other | Admitting: Psychiatry

## 2021-04-09 ENCOUNTER — Telehealth: Payer: Self-pay | Admitting: Critical Care Medicine

## 2021-04-09 ENCOUNTER — Encounter (HOSPITAL_COMMUNITY): Payer: Self-pay

## 2021-04-09 ENCOUNTER — Telehealth (HOSPITAL_COMMUNITY): Payer: No Payment, Other | Admitting: Psychiatry

## 2021-04-09 NOTE — Telephone Encounter (Signed)
Called, spoke with pt. Schedule virtual visit to discuss weight management. ?

## 2021-04-09 NOTE — Telephone Encounter (Signed)
Copied from CRM [id number]. Topic: General - Other ?>> Apr 09, 2021  9:43 AM Jaquita Rector A wrote: ?Reason for CRM: Patient called in asking to talk to Dr on call states that she have been trying to loose weight for over a year and is now seeking some type of medication to aide her in the weight loss journey. Patient is asking for a call back today please at Ph# [phone] ?

## 2021-04-11 ENCOUNTER — Encounter (HOSPITAL_COMMUNITY): Payer: Self-pay

## 2021-04-11 ENCOUNTER — Other Ambulatory Visit: Payer: Self-pay

## 2021-04-11 ENCOUNTER — Emergency Department (HOSPITAL_COMMUNITY)
Admission: EM | Admit: 2021-04-11 | Discharge: 2021-04-11 | Disposition: A | Discharge status: Home / Self Care | Payer: Self-pay | Attending: Emergency Medicine | Admitting: Emergency Medicine

## 2021-04-11 ENCOUNTER — Emergency Department (HOSPITAL_COMMUNITY): Payer: Self-pay

## 2021-04-11 DIAGNOSIS — R200 Anesthesia of skin: Secondary | ICD-10-CM | POA: Insufficient documentation

## 2021-04-11 DIAGNOSIS — Z7984 Long term (current) use of oral hypoglycemic drugs: Secondary | ICD-10-CM | POA: Insufficient documentation

## 2021-04-11 DIAGNOSIS — R7303 Prediabetes: Secondary | ICD-10-CM | POA: Insufficient documentation

## 2021-04-11 DIAGNOSIS — R202 Paresthesia of skin: Secondary | ICD-10-CM | POA: Insufficient documentation

## 2021-04-11 HISTORY — DX: Post-traumatic stress disorder, unspecified: F43.10

## 2021-04-11 LAB — I-STAT CHEM 8, ED
BUN: 16 mg/dL (ref 6–20)
Calcium, Ion: 1.19 mmol/L (ref 1.15–1.40)
Chloride: 106 mmol/L (ref 98–111)
Creatinine, Ser: 1 mg/dL (ref 0.44–1.00)
Glucose, Bld: 88 mg/dL (ref 70–99)
HCT: 40 % (ref 36.0–46.0)
Hemoglobin: 13.6 g/dL (ref 12.0–15.0)
Potassium: 3.5 mmol/L (ref 3.5–5.1)
Sodium: 141 mmol/L (ref 135–145)
TCO2: 24 mmol/L (ref 22–32)

## 2021-04-11 LAB — I-STAT BETA HCG BLOOD, ED (MC, WL, AP ONLY): I-stat hCG, quantitative: <5 m[IU]/mL (ref <5)

## 2021-04-11 NOTE — ED Triage Notes (Signed)
Patient reports that she has right toe numbness all toes and 4 toes on the left. Patient also c/o right face numbness and intermittent right arm numbness x 4 days. ? ?Patient states she has a history of stokes ?

## 2021-04-11 NOTE — ED Provider Notes (Signed)
?[facility] DEPT ?Provider Note ? ? ?CSN: [id number] ?Arrival date & time: 04/11/21  1759 ? ?  ? ?History ? ?Chief Complaint  ?Patient presents with  ? Numbness  ? ? ?Sarah Evans is a 41 y.o. female. ? ?HPI ?41 year old female history of ischemic stroke, schizoaffective disorder, prediabetes, mixed hyperlipidemia who presents today complaining of paresthesias.  She reports that she has had paresthesias in both of her feet since 2 days ago.  She also reports that she has had numbness on the right side of her face and the right side of her body.  Reports this is been intermittent and going on for the past 4 days.  She denies any headache or head injury.  She reports that her bipolar is well controlled with taking between 102 100 mg of Seroquel per night.  She is concerned that her blood sugars are elevated.  She states her blood sugar has not been checked since last time she was here.  She has been changing her doses of metformin based on her toes tingling.  She denies headache, head injury, fever, or chills. ?  ? ?Home Medications ?Prior to Admission medications   ?Medication Sig Start Date End Date Taking? Authorizing Provider  ?atorvastatin (LIPITOR) 10 MG tablet Take 1 tablet (10 mg total) by mouth daily. 04/12/20   Elsie Stain, MD  ?cefdinir (OMNICEF) 300 MG capsule Take 1 capsule (300 mg total) by mouth 2 (two) times daily. 01/08/21   Mar Daring, PA-C  ?cholecalciferol (VITAMIN D3) 25 MCG (1000 UNIT) tablet Take 2,000 Units by mouth daily. ?Patient not taking: Reported on 11/01/2020    [provider]  ?clobetasol cream (TEMOVATE) [id number] % Apply 1 application topically 2 (two) times daily. 10/13/20   Elsie Stain, MD  ?erythromycin ophthalmic ointment Place 1 application into the right eye at bedtime. 01/08/21   Mar Daring, PA-C  ?FLUoxetine (PROZAC) 20 MG capsule Take 1 capsule (20 mg total) by mouth daily. 10/16/20   Salley Slaughter, NP   ?ipratropium (ATROVENT) 0.03 % nasal spray Place 2 sprays into both nostrils every 12 (twelve) hours. 01/08/21   Mar Daring, PA-C  ?lamoTRIgine (LAMICTAL) 25 MG tablet Take 3 tablets (75 mg total) by mouth daily. 10/16/20   Salley Slaughter, NP  ?metFORMIN (GLUCOPHAGE) 500 MG tablet Take 1 tablet (500 mg total) by mouth daily with breakfast. 09/12/20   Elsie Stain, MD  ?Multiple Vitamin (MULTIVITAMIN WITH MINERALS) TABS tablet Take 1 tablet by mouth daily. ?Patient not taking: Reported on 11/01/2020    [provider]  ?norgestimate-ethinyl estradiol (MILI) 0.25-35 MG-MCG tablet Take 1 tablet by mouth daily. 04/01/21   Griffin Basil, MD  ?pantoprazole (PROTONIX) 20 MG tablet Take 1 tablet (20 mg total) by mouth daily. 05/16/20   Woodroe Mode, MD  ?prazosin (MINIPRESS) 2 MG capsule Take 1 capsule (2 mg total) by mouth at bedtime. 01/21/21   Salley Slaughter, NP  ?QUEtiapine (SEROQUEL XR) 50 MG TB24 24 hr tablet Take 2 tablets (100 mg total) by mouth at bedtime. 01/21/21   Salley Slaughter, NP  ?spironolactone (ALDACTONE) 100 MG tablet take 1 tablet by mouth twice daily 04/02/20     ?tizanidine (ZANAFLEX) 6 MG capsule Take 1 capsule (6 mg total) by mouth 3 (three) times daily as needed for muscle spasms. ?Patient not taking: Reported on 11/01/2020 07/23/20   Elsie Stain, MD  ?valACYclovir (VALTREX) 1000 MG tablet Take 1  tablet (1,000 mg total) by mouth daily. 05/21/20   Elsie Stain, MD  ?   ? ?Allergies    ?Patient has no known allergies.   ? ?Review of Systems   ?Review of Systems  ?All other systems reviewed and are negative. ? ?Physical Exam ?Updated Vital Signs ?BP 122/83   Pulse 88   Temp 98.7 ?F (37.1 ?C) (Oral)   Resp 15   Ht 1.702 m (5\' 7" )   Wt 88.5 kg   LMP 04/08/2021   SpO2 100%   BMI 30.54 kg/m?  ?Physical Exam ?Vitals and nursing note reviewed.  ?Constitutional:   ?   Appearance: Normal appearance.  ?HENT:  ?   Head: Normocephalic and atraumatic.  ?    Right Ear: External ear normal.  ?   Left Ear: External ear normal.  ?   Nose: Nose normal.  ?   Mouth/Throat:  ?   Mouth: Mucous membranes are moist.  ?   Pharynx: Oropharynx is clear.  ?Eyes:  ?   Pupils: Pupils are equal, round, and reactive to light.  ?Cardiovascular:  ?   Rate and Rhythm: Normal rate and regular rhythm.  ?   Pulses: Normal pulses.  ?Pulmonary:  ?   Effort: Pulmonary effort is normal.  ?   Breath sounds: Normal breath sounds.  ?Abdominal:  ?   General: Abdomen is flat.  ?   Palpations: Abdomen is soft.  ?Musculoskeletal:     ?   General: Normal range of motion.  ?   Cervical back: Normal range of motion.  ?Skin: ?   General: Skin is warm.  ?   Capillary Refill: Capillary refill takes less than 2 seconds.  ?Neurological:  ?   General: No focal deficit present.  ?   Mental Status: She is alert.  ?   Cranial Nerves: No cranial nerve deficit.  ?   Sensory: Sensory deficit present.  ?   Motor: No weakness.  ?   Coordination: Coordination normal.  ?   Gait: Gait normal.  ?   Deep Tendon Reflexes: Reflexes normal.  ?   Comments: Patient with unclear pattern of numbness.  She states multiple different areas are sharp versus numb on her bilateral lower extremities  ?Psychiatric:     ?   Mood and Affect: Mood normal.  ? ? ?ED Results / Procedures / Treatments   ?Labs ?(all labs ordered are listed, but only abnormal results are displayed) ?Labs Reviewed  ?I-STAT CHEM 8, ED  ?I-STAT BETA HCG BLOOD, ED ([facility], [facility], [facility] ONLY)  ? ? ?EKG ?None ? ?Radiology ?CT Head Wo Contrast ? ?Result Date: 04/11/2021 ?CLINICAL DATA:  BILATERAL toe numbness RIGHT greater than LEFT, RIGHT facial numbness and intermittent RIGHT arm numbness for 4 days, history of stroke EXAM: CT HEAD WITHOUT CONTRAST TECHNIQUE: Contiguous axial images were obtained from the base of the skull through the vertex without intravenous contrast. RADIATION DOSE REDUCTION: This exam was performed according to the departmental dose-optimization program which  includes automated exposure control, adjustment of the mA and/or kV according to patient size and/or use of iterative reconstruction technique. COMPARISON:  06/15/2020 FINDINGS: Brain: Normal ventricular morphology. No midline shift or mass effect. Old RIGHT frontal infarct. Otherwise normal appearance of brain parenchyma. No intracranial hemorrhage, mass lesion, or evidence of acute infarction. No extra-axial fluid collections. Vascular: No hyperdense vessels Skull: Intact Sinuses/Orbits: Clear Other: N/A IMPRESSION: Old RIGHT frontal infarct. No acute intracranial abnormalities. Electronically Signed   By: Elta Guadeloupe  Thornton Papas M.D.   On: 04/11/2021 19:12   ? ?Procedures ?Procedures  ? ? ?Medications Ordered in ED ?Medications - No data to display ? ?ED Course/ Medical Decision Making/ A&P ?Clinical Course as of 04/11/21 1931  ?Thu Apr 11, 2021  ?1927 Head CT reviewed interpreted old right frontal infarct noted no acute abnormalities noted [DR]  ?1928 I-STAT reviewed interpreted and glucose is 88 remaining electrolytes are normal hemoglobin is 13 patient has a negative pregnancy test [DR]  ?  ?Clinical Course User Index ?[DR] Pattricia Boss, MD  ? ?                        ?Medical Decision Making ?Reviewed admission 822 when patient had left hand weakness and was admitted with stroke.  During that work-up she had multiple areas of flow restriction and petechial hemorrhages she had a evaluation with TEE, LP and echo.  She was discharged on antiplatelet therapy of aspirin and clopidogrel.  She has had multiple reevaluations at time of paresthesias without any new weakness.  None of these episodes have had patterns consistent with stroke. ?Patient with normal motor exam, no aphasia, no vision changes, she has altered sensation that is from 1 dermatome to the next and changing consistently in bilateral lower extremities.  Given these findings, do not feel the patient needs to have MRI done at this time. ?She is discharged to  follow-up with her primary care physician. ? ?Amount and/or Complexity of Data Reviewed ?External Data Reviewed: notes. ?   Details: Discharge summary and admission reviewed from 08/25/2020 when patient had stroke ?Radiology:

## 2021-04-11 NOTE — Discharge Instructions (Addendum)
Your blood work is normal today. ?Your numbness does not appear to be consistent with a stroke and your head CT does not show any acute abnormalities. ?Return if you are having new symptoms. ?Please follow-up with your primary care physician and your neurologist. ?

## 2021-04-16 ENCOUNTER — Telehealth (HOSPITAL_BASED_OUTPATIENT_CLINIC_OR_DEPARTMENT_OTHER): Payer: Medicaid Other | Admitting: Nurse Practitioner

## 2021-04-16 DIAGNOSIS — E669 Obesity, unspecified: Secondary | ICD-10-CM

## 2021-04-16 NOTE — Progress Notes (Addendum)
Virtual Visit via Telephone Note  I discussed the limitations, risks, security and privacy concerns of performing an evaluation and management service by telephone and the availability of in person appointments. I also discussed with the patient that there may be a patient responsible charge related to this service. The patient expressed understanding and agreed to proceed.  ? ? ?I connected with Sarah Evans on 04/17/21  at   8:50 AM EDT  EDT by telephone and verified that I am speaking with the correct person using two identifiers. ? ?Location of Patient: ?Private Residence ?  ?Location of Provider: ?[employer] and [employer]  ?  ?Persons participating in Telemedicine visit: ?Bertram Denver FNP-BC ?Sarah Evans  ?  ?History of Present Illness: ?Telemedicine visit for: WEIGHT LOSS/OBESITY ? ?She is having difficulty losing weight. Would like options for weight loss and is interested in supplements. States she lost 25lbs in the past but gained it all back. She is uninsured at this time.  ?Recently started exercising but not daily. BMI 30. ?She is on several mood stabilizers which may be contributing to her difficulty losing weight.  ? ? ?Past Medical History:  ?Diagnosis Date  ? Bipolar 1 disorder (HCC)   ? Herpes simplex virus (HSV) infection   ? dx 2001  ? Polycystic ovary disease   ? PTSD (post-traumatic stress disorder)   ? Schizophrenia (HCC)   ? Stroke [facility])   ?  ?Past Surgical History:  ?Procedure Laterality Date  ? BUBBLE STUDY  05/25/2020  ? Procedure: BUBBLE STUDY;  Surgeon: Chrystie Nose, MD;  Location: [facility] ENDOSCOPY;  Service: Cardiovascular;;  ? NO PAST SURGERIES    ? TEE WITHOUT CARDIOVERSION N/A 05/25/2020  ? Procedure: TRANSESOPHAGEAL ECHOCARDIOGRAM (TEE);  Surgeon: Chrystie Nose, MD;  Location: [facility] ENDOSCOPY;  Service: Cardiovascular;  Laterality: N/A;  ?  ?Family History  ?Problem Relation Age of Onset  ? Bipolar disorder Mother   ? Diabetes Maternal Grandmother   ?  Hypertension Neg Hx   ? Heart disease Neg Hx   ? Cancer Neg Hx   ?  ?Social History  ? ?Socioeconomic History  ? Marital status: Single  ?  Spouse name: Not on file  ? Number of children: Not on file  ? Years of education: Not on file  ? Highest education level: Not on file  ?Occupational History  ? Not on file  ?Tobacco Use  ? Smoking status: Former  ?  Packs/day: 0.25  ?  Years: 15.00  ?  Pack years: 3.75  ?  Types: Cigarettes  ?  Quit date: 05/02/2020  ?  Years since quitting: 0.9  ? Smokeless tobacco: Never  ?Vaping Use  ? Vaping Use: Never used  ?Substance and Sexual Activity  ? Alcohol use: No  ? Drug use: No  ? Sexual activity: Not Currently  ?  Birth control/protection: Abstinence  ?Other Topics Concern  ? Not on file  ?Social History Narrative  ? Not on file  ? ?Social Determinants of Health  ? ?Financial Resource Strain: Not on file  ?Food Insecurity: Not on file  ?Transportation Needs: Not on file  ?Physical Activity: Not on file  ?Stress: Not on file  ?Social Connections: Not on file  ?  ? ?Observations/Objective: ?Awake, alert and oriented x 3 ? ? ?Review of Systems  ?Constitutional:  Negative for fever, malaise/fatigue and weight loss.  ?HENT: Negative.  Negative for nosebleeds.   ?Eyes: Negative.  Negative for blurred vision, double vision  and photophobia.  ?Respiratory: Negative.  Negative for cough and shortness of breath.   ?Cardiovascular: Negative.  Negative for chest pain, palpitations and leg swelling.  ?Gastrointestinal: Negative.  Negative for heartburn, nausea and vomiting.  ?Musculoskeletal: Negative.  Negative for myalgias.  ?Neurological: Negative.  Negative for dizziness, focal weakness, seizures and headaches.  ?Psychiatric/Behavioral: Negative.  Negative for suicidal ideas.    ?Assessment and Plan: ?Diagnoses and all orders for this visit: ? ?Obesity (BMI 30-39.9) ?We had a Lengthy discussion regarding dietary modifications: more protein, less carbs, less fat, exercising for at least  4-5 times per week 30-45 minutes each day ? ?Patient was urged to apply for the financial assistance program.  They were instructed to inquire at the front desk about the application process for the [place] discount, orange card or other financial assistance.     ? ?Follow Up Instructions ?Return if symptoms worsen or fail to improve.  ? ?  ?I discussed the assessment and treatment plan with the patient. The patient was provided an opportunity to ask questions and all were answered. The patient agreed with the plan and demonstrated an understanding of the instructions. ?  ?The patient was advised to call back or seek an in-person evaluation if the symptoms worsen or if the condition fails to improve as anticipated. ? ?I provided 12 minutes of non-face-to-face time during this encounter including median intraservice time, reviewing previous notes, labs, imaging, medications and explaining diagnosis and management. ? ?Claiborne Rigg, FNP-BC  ?

## 2021-04-17 ENCOUNTER — Encounter: Payer: Self-pay | Admitting: Nurse Practitioner

## 2021-04-19 ENCOUNTER — Other Ambulatory Visit: Payer: Self-pay

## 2021-04-19 ENCOUNTER — Other Ambulatory Visit: Payer: Self-pay | Admitting: Critical Care Medicine

## 2021-04-19 NOTE — Telephone Encounter (Signed)
Requested medication (s) are due for refill today: expired medication ? ?Requested medication (s) are on the active medication list: yes ? ?Last refill:  04/02/20 #390 0 refills ? ?Future visit scheduled: no ? ?Notes to clinic:  expired medication. No provider listed who last ordered.. do you want to renew Rx? ? ? ?  ?Requested Prescriptions  ?Pending Prescriptions Disp Refills  ? spironolactone (ALDACTONE) 100 MG tablet 390 tablet 0  ?  Sig: take 1 tablet by mouth twice daily  ?  ? Cardiovascular: Diuretics - Aldosterone Antagonist Failed - 04/19/2021  9:29 AM  ?  ?  Failed - eGFR is 30 or above and within 180 days  ?  GFR calc Af Amer  ?Date Value Ref Range Status  ?10/26/2018 80 >59 mL/min/1.73 Final  ? ?GFR, Estimated  ?Date Value Ref Range Status  ?09/08/2020 >60 >60 mL/min Final  ?  Comment:  ?  (NOTE) ?Calculated using the CKD-EPI Creatinine Equation (2021) ?  ? ?eGFR  ?Date Value Ref Range Status  ?05/21/2020 68 >59 mL/min/1.73 Final  ?  ?  ?  ?  Passed - Cr in normal range and within 180 days  ?  Creatinine, Ser  ?Date Value Ref Range Status  ?04/11/2021 1.00 0.44 - 1.00 mg/dL Final  ?  ?  ?  ?  Passed - K in normal range and within 180 days  ?  Potassium  ?Date Value Ref Range Status  ?04/11/2021 3.5 3.5 - 5.1 mmol/L Final  ?  ?  ?  ?  Passed - Na in normal range and within 180 days  ?  Sodium  ?Date Value Ref Range Status  ?04/11/2021 141 135 - 145 mmol/L Final  ?05/21/2020 139 134 - 144 mmol/L Final  ?  ?  ?  ?  Passed - Last BP in normal range  ?  BP Readings from Last 1 Encounters:  ?04/11/21 122/83  ?  ?  ?  ?  Passed - Valid encounter within last 6 months  ?  Recent Outpatient Visits   ? ?      ? 3 days ago Obesity (BMI 30-39.9)  ? [place], [place] W, NP  ? 5 months ago History of ischemic stroke  ? [place] Elsie Stain, MD  ? 5 months ago Chronic thoracic back pain, unspecified back pain laterality  ? [place], [place]  ? 11 months ago Hyperlipidemia associated with type 2 diabetes mellitus ([place])  ? [place] Elsie Stain, MD  ? 1 year ago Muscle spasticity  ? [place] Elsie Stain, MD  ? ?  ?  ? ?  ?  ?  ? ?

## 2021-04-22 ENCOUNTER — Other Ambulatory Visit: Payer: Self-pay

## 2021-04-22 MED ORDER — SPIRONOLACTONE 100 MG PO TABS
ORAL_TABLET | ORAL | 0 refills | Status: DC
  Filled 2021-04-22: qty 60, 30d supply, fill #0
  Filled 2021-05-29 – 2021-06-06 (×2): qty 60, 30d supply, fill #1

## 2021-04-22 NOTE — Telephone Encounter (Signed)
Pt called and made appt that is needed to get her medication.  ?Please send in  ?spironolactone (ALDACTONE) 100 MG tablet ? To get her through to 06/20/21 ? ?[place] at [facility] ?

## 2021-04-22 NOTE — Addendum Note (Signed)
Addended by: Lois Huxley, Jeannett Senior L on: 04/22/2021 04:37 PM ? ? Modules accepted: Orders ? ?

## 2021-04-23 ENCOUNTER — Other Ambulatory Visit: Payer: Self-pay

## 2021-04-23 ENCOUNTER — Telehealth: Payer: Self-pay | Admitting: Critical Care Medicine

## 2021-04-23 NOTE — Telephone Encounter (Signed)
Will route to Zelda's nurse for follow-up. ?

## 2021-04-23 NOTE — Telephone Encounter (Signed)
Pt called in for Dr. Mayo Ao stating there previous conversation about the weight lose medication, and that the pharmacy is able to refill that medication, but pt states that she wants to do something that will work for her. (She did not know the name) ? ?[facility] at [facility]  ?[address], [address], [place] [address]  ?Phone:  [phone]  Fax:  [phone] ? ?

## 2021-04-24 ENCOUNTER — Encounter: Payer: Self-pay | Admitting: Nurse Practitioner

## 2021-04-24 ENCOUNTER — Other Ambulatory Visit: Payer: Self-pay

## 2021-04-24 ENCOUNTER — Ambulatory Visit: Payer: Self-pay | Admitting: *Deleted

## 2021-04-24 NOTE — Telephone Encounter (Signed)
Patient returned call regarding questions for weight loss medication and how much she would need to pay out of pocket. Reviewed message from Z. Meredeth Ide, NP from 04/24/21 asking what weight loss medication does her insurance pay for and what guidelines are required for injectables. Recommend calling insurance to obtain names of medication they will cover and the criteria for them to cover those medication meaning do you meet the guidelines. Patient reports she will contact insurance . ?Reason for Disposition ? Prescription request for new medicine (not a refill) ? ?Answer Assessment - Initial Assessment Questions ?1. NAME of MEDICATION: "What medicine are you calling about?" ?    Patient did not report a specific medication only a medication for weight loss ?2. QUESTION: "What is your question?" (e.g., double dose of medicine, side effect) ?    Which medications for weight loss and how much cost out of pocket? ?3. PRESCRIBING HCP: "Who prescribed it?" Reason: if prescribed by specialist, call should be referred to that group. ?    Na  ?4. SYMPTOMS: "Do you have any symptoms?" ?    na ?5. SEVERITY: If symptoms are present, ask "Are they mild, moderate or severe?" ?    na ?6. PREGNANCY:  "Is there any chance that you are pregnant?" "When was your last menstrual period?" ?    na ? ?Protocols used: Medication Question Call-A-AH ? ?

## 2021-04-24 NOTE — Telephone Encounter (Signed)
fyi

## 2021-04-25 ENCOUNTER — Other Ambulatory Visit: Payer: Self-pay

## 2021-04-30 ENCOUNTER — Other Ambulatory Visit: Payer: Self-pay

## 2021-05-01 ENCOUNTER — Telehealth (INDEPENDENT_AMBULATORY_CARE_PROVIDER_SITE_OTHER): Payer: No Payment, Other | Admitting: Psychiatry

## 2021-05-01 ENCOUNTER — Other Ambulatory Visit: Payer: Self-pay

## 2021-05-01 DIAGNOSIS — F502 Bulimia nervosa: Secondary | ICD-10-CM

## 2021-05-01 DIAGNOSIS — F4310 Post-traumatic stress disorder, unspecified: Secondary | ICD-10-CM | POA: Diagnosis not present

## 2021-05-01 DIAGNOSIS — F250 Schizoaffective disorder, bipolar type: Secondary | ICD-10-CM

## 2021-05-01 MED ORDER — PRAZOSIN HCL 2 MG PO CAPS
2.0000 mg | ORAL_CAPSULE | Freq: Every day | ORAL | 0 refills | Status: DC
  Filled 2021-05-01 – 2021-05-10 (×2): qty 30, 30d supply, fill #0

## 2021-05-01 MED ORDER — QUETIAPINE FUMARATE ER 150 MG PO TB24
150.0000 mg | ORAL_TABLET | Freq: Every day | ORAL | 0 refills | Status: DC
  Filled 2021-05-01 – 2021-05-10 (×2): qty 30, 30d supply, fill #0

## 2021-05-01 NOTE — Progress Notes (Signed)
BH MD/PA/NP OP Progress Note ? ?05/01/2021 3:51 PM ?Sarah GarfinkelLatasha D Evans  ?MRN:  [id number] ? ? ?Virtual Visit via Telephone Note ? ?I connected with Sarah Evans on 05/01/21 at  8:00 AM EDT by telephone and verified that I am speaking with the correct person using two identifiers. ? ?Location: ?Patient: home ?Provider: offsite ?  ?I discussed the limitations, risks, security and privacy concerns of performing an evaluation and management service by telephone and the availability of in person appointments. I also discussed with the patient that there may be a patient responsible charge related to this service. The patient expressed understanding and agreed to proceed. ? ? ? ?  ?I discussed the assessment and treatment plan with the patient. The patient was provided an opportunity to ask questions and all were answered. The patient agreed with the plan and demonstrated an understanding of the instructions. ?  ?The patient was advised to call back or seek an in-person evaluation if the symptoms worsen or if the condition fails to improve as anticipated. ? ?I provided 10 minutes of non-face-to-face time during this encounter. ? ? ?Sarah Sobericely Tharon Bomar, NP ?  ?Chief Complaint: Medication management  ? ?HPI: Sarah Evans is a 41 year [facility]up psychiatric evaluation.  Patient has a psychiatric history of schizoaffective disorder bipolar type, generalized anxiety disorder, PTSD.  Patient symptoms are managed with Seroquel 100 mg daily at bedtime and prazosin 2 mg daily at bedtime.  Patient reports that she is having difficulty sleeping and is open to going back up on her Seroquel dosage.  Patient previously on 200 mg of Seroquel.  Medication benefits versus risks discussed.  Seroquel 150 mg at bedtime ordered.  Prazosin refilled at current dosage. ? ?Visit Diagnosis:  ?  ICD-10-CM   ?1. Schizoaffective disorder, bipolar type (HCC)  F25.0 QUEtiapine Fumarate (SEROQUEL  XR) 150 MG 24 hr tablet  ?  ?2. Bulimia nervosa  F50.2   ?  ?3. PTSD (post-traumatic stress disorder)  F43.10 prazosin (MINIPRESS) 2 MG capsule  ?  ? ? ?Past Psychiatric History:  ? ?Past Medical History:  ?Past Medical History:  ?Diagnosis Date  ?? Bipolar 1 disorder (HCC)   ?? Herpes simplex virus (HSV) infection   ? dx 2001  ?? Polycystic ovary disease   ?? PTSD (post-traumatic stress disorder)   ?? Schizophrenia (HCC)   ?? Stroke [facility](HCC)   ?  ?Past Surgical History:  ?Procedure Laterality Date  ?? BUBBLE STUDY  05/25/2020  ? Procedure: BUBBLE STUDY;  Surgeon: Chrystie NoseHilty, Kenneth C, MD;  [facility];  Service: Cardiovascular;;  ?? NO PAST SURGERIES    ?? TEE WITHOUT CARDIOVERSION N/A 05/25/2020  ? Procedure: TRANSESOPHAGEAL ECHOCARDIOGRAM (TEE);  Surgeon: Chrystie NoseHilty, Kenneth C, MD;  [facility];  Service: Cardiovascular;  Laterality: N/A;  ? ? ?Family Psychiatric History:  ? ?Family History:  ?Family History  ?Problem Relation Age of Onset  ?? Bipolar disorder Mother   ?? Diabetes Maternal Grandmother   ?? Hypertension Neg Hx   ?? Heart disease Neg Hx   ?? Cancer Neg Hx   ? ? ?Social History:  ?Social History  ? ?Socioeconomic History  ?? Marital status: Single  ?  Spouse name: Not on file  ?? Number of children: Not on file  ?? Years of education: Not on file  ?? Highest education level: Not on file  ?Occupational History  ?? Not on file  ?Tobacco Use  ?? Smoking status: Former  ?  Packs/day: 0.25  ?  Years: 15.00  ?  Pack years: 3.75  ?  Types: Cigarettes  ?  Quit date: 05/02/2020  ?  Years since quitting: 0.9  ?? Smokeless tobacco: Never  ?Vaping Use  ?? Vaping Use: Never used  ?Substance and Sexual Activity  ?? Alcohol use: No  ?? Drug use: No  ?? Sexual activity: Not Currently  ?  Birth control/protection: Abstinence  ?Other Topics Concern  ?? Not on file  ?Social History Narrative  ?? Not on file  ? ?Social Determinants of Health  ? ?Financial Resource Strain: Not on file  ?Food Insecurity: Not on  file  ?Transportation Needs: Not on file  ?Physical Activity: Not on file  ?Stress: Not on file  ?Social Connections: Not on file  ? ? ?Allergies: No Known Allergies ? ?Metabolic Disorder Labs: ?Lab Results  ?Component Value Date  ? HGBA1C 5.7 (H) 06/15/2020  ? MPG 117 06/15/2020  ? MPG 128.37 05/24/2020  ? ?Lab Results  ?Component Value Date  ? PROLACTIN 13.0 04/28/2015  ? ?Lab Results  ?Component Value Date  ? CHOL 143 05/24/2020  ? TRIG 142 05/24/2020  ? HDL 52 05/24/2020  ? CHOLHDL 2.8 05/24/2020  ? VLDL 28 05/24/2020  ? LDLCALC 63 05/24/2020  ? LDLCALC 78 05/21/2020  ? ?Lab Results  ?Component Value Date  ? TSH 2.110 04/02/2020  ? TSH 2.040 10/26/2018  ? ? ?Therapeutic Level Labs: ?No results found for: LITHIUM ?No results found for: VALPROATE ?No components found for:  CBMZ ? ?Current Medications: ?Current Outpatient Medications  ?Medication Sig Dispense Refill  ?? atorvastatin (LIPITOR) 10 MG tablet Take 1 tablet (10 mg total) by mouth daily. 90 tablet 3  ?? cefdinir (OMNICEF) 300 MG capsule Take 1 capsule (300 mg total) by mouth 2 (two) times daily. 20 capsule 0  ?? cholecalciferol (VITAMIN D3) 25 MCG (1000 UNIT) tablet Take 2,000 Units by mouth daily. (Patient not taking: Reported on 11/01/2020)    ?? clobetasol cream (TEMOVATE) 0.05 % Apply 1 application topically 2 (two) times daily. 30 g 0  ?? erythromycin ophthalmic ointment Place 1 application into the right eye at bedtime. 3.5 g 0  ?? FLUoxetine (PROZAC) 20 MG capsule Take 1 capsule (20 mg total) by mouth daily. 30 capsule 3  ?? ipratropium (ATROVENT) 0.03 % nasal spray Place 2 sprays into both nostrils every 12 (twelve) hours. 30 mL 0  ?? lamoTRIgine (LAMICTAL) 25 MG tablet Take 3 tablets (75 mg total) by mouth daily. 120 tablet 2  ?? metFORMIN (GLUCOPHAGE) 500 MG tablet Take 1 tablet (500 mg total) by mouth daily with breakfast. 60 tablet 3  ?? Multiple Vitamin (MULTIVITAMIN WITH MINERALS) TABS tablet Take 1 tablet by mouth daily. (Patient not  taking: Reported on 11/01/2020)    ?? norgestimate-ethinyl estradiol (MILI) 0.25-35 MG-MCG tablet Take 1 tablet by mouth daily. 28 tablet 2  ?? pantoprazole (PROTONIX) 20 MG tablet Take 1 tablet (20 mg total) by mouth daily. 30 tablet 1  ?? prazosin (MINIPRESS) 2 MG capsule Take 1 capsule (2 mg total) by mouth at bedtime. 30 capsule 0  ?? QUEtiapine Fumarate (SEROQUEL XR) 150 MG 24 hr tablet Take 1 tablet (150 mg total) by mouth at bedtime. 30 tablet 0  ?? spironolactone (ALDACTONE) 100 MG tablet take 1 tablet by mouth twice daily 390 tablet 0  ?? tizanidine (ZANAFLEX) 6 MG capsule Take 1 capsule (6 mg total) by mouth 3 (three) times daily as needed for muscle spasms. (Patient  not taking: Reported on 11/01/2020) 90 capsule 1  ?? valACYclovir (VALTREX) 1000 MG tablet Take 1 tablet (1,000 mg total) by mouth daily. 60 tablet 2  ? ?No current facility-administered medications for this visit.  ? ? ? ?Musculoskeletal: ?Strength & Muscle Tone:  n/a virtual ?Gait & Station:  n/a ?Patient leans: N/A ? ?Psychiatric Specialty Exam: ?Review of Systems  ?Psychiatric/Behavioral:  Positive for sleep disturbance. Negative for hallucinations and suicidal ideas. The patient is not nervous/anxious.   ?All other systems reviewed and are negative.  ?Last menstrual period 04/08/2021.There is no height or weight on file to calculate BMI.  ?General Appearance: NA  ?Eye Contact:  NA  ?Speech:  Clear and Coherent  ?Volume:  Normal  ?Mood:  Euthymic  ?Affect:  Congruent  ?Thought Process:  Coherent  ?Orientation:  Full (Time, Place, and Person)  ?Thought Content: Logical   ?Suicidal Thoughts:  No  ?Homicidal Thoughts:  No  ?Memory:   good  ?Judgement:  Good  ?Insight:  Good  ?Psychomotor Activity:  NA  ?Concentration:  good  ?Recall:  Good  ?Fund of Knowledge: Good  ?Language: Good  ?Akathisia:  NA  ?Handed:  Right  ?AIMS (if indicated): not done  ?Assets:  Communication Skills ?Desire for Improvement  ?ADL's:  Intact  ?Cognition: WNL   ?Sleep:  Fair  ? ?Screenings: ?AUDIT   ? ?Flowsheet Row [facility]  ?Alcohol Use Disorder Identification Test Final Score (AUDIT) 0  ? ?

## 2021-05-08 ENCOUNTER — Other Ambulatory Visit: Payer: Self-pay

## 2021-05-09 ENCOUNTER — Telehealth (HOSPITAL_COMMUNITY): Payer: Self-pay | Admitting: *Deleted

## 2021-05-09 NOTE — Telephone Encounter (Signed)
VM from patient stating she had seen a NP last week and is on Seroquel, but, she states she had been taking 50 mg of Seroquel during the day and then takes 150 mg at night and she currently has a rx for only the 150 at HS. She is seeking a rx for additional 50 mg in the day "to help keep me calm." Additionally she wanted to know if we can put her back on the Sertraline. I will forward her concerns to the provider. ?

## 2021-05-10 ENCOUNTER — Other Ambulatory Visit (HOSPITAL_COMMUNITY): Payer: Self-pay | Admitting: Psychiatry

## 2021-05-10 ENCOUNTER — Other Ambulatory Visit: Payer: Self-pay | Admitting: Critical Care Medicine

## 2021-05-10 ENCOUNTER — Other Ambulatory Visit: Payer: Self-pay

## 2021-05-10 DIAGNOSIS — F250 Schizoaffective disorder, bipolar type: Secondary | ICD-10-CM

## 2021-05-10 MED ORDER — ATORVASTATIN CALCIUM 10 MG PO TABS
10.0000 mg | ORAL_TABLET | Freq: Every day | ORAL | 0 refills | Status: DC
  Filled 2021-05-10: qty 90, 90d supply, fill #0

## 2021-05-10 NOTE — Telephone Encounter (Signed)
Requested Prescriptions  ?Pending Prescriptions Disp Refills  ?? atorvastatin (LIPITOR) 10 MG tablet 90 tablet 3  ?  Sig: Take 1 tablet (10 mg total) by mouth daily.  ?  ? Cardiovascular:  Antilipid - Statins Failed - 05/10/2021  2:31 PM  ?  ?  Failed - Lipid Panel in normal range within the last 12 months  ?  Cholesterol, Total  ?Date Value Ref Range Status  ?05/21/2020 160 100 - 199 mg/dL Final  ? ?Cholesterol  ?Date Value Ref Range Status  ?05/24/2020 143 0 - 200 mg/dL Final  ? ?LDL Chol Calc (NIH)  ?Date Value Ref Range Status  ?05/21/2020 78 0 - 99 mg/dL Final  ? ?LDL Cholesterol  ?Date Value Ref Range Status  ?05/24/2020 63 0 - 99 mg/dL Final  ?  Comment:  ?         ?Total Cholesterol/HDL:CHD Risk ?Coronary Heart Disease Risk Table ?                    Men   Women ? 1/2 Average Risk   3.4   3.3 ? Average Risk       5.0   4.4 ? 2 X Average Risk   9.6   7.1 ? 3 X Average Risk  23.4   11.0 ?       ?Use the calculated Patient Ratio ?above and the CHD Risk Table ?to determine the patient's CHD Risk. ?       ?ATP III CLASSIFICATION (LDL): ? <100     mg/dL   Optimal ? 100-129  mg/dL   Near or Above ?                   Optimal ? 130-159  mg/dL   Borderline ? 160-189  mg/dL   High ? >190     mg/dL   Very High ?Performed at [facility], [place] [address], [place], [place] [address] ?  ? ?HDL  ?Date Value Ref Range Status  ?05/24/2020 52 >40 mg/dL Final  ?05/21/2020 59 >39 mg/dL Final  ? ?Triglycerides  ?Date Value Ref Range Status  ?05/24/2020 142 <150 mg/dL Final  ? ?  ?  ?  Passed - Patient is not pregnant  ?  ?  Passed - Valid encounter within last 12 months  ?  Recent Outpatient Visits   ?      ? 3 weeks ago Obesity (BMI 30-39.9)  ? [place], [place] W, NP  ? 6 months ago History of ischemic stroke  ? [place] Elsie Stain, MD  ? 6 months ago Chronic thoracic back pain, unspecified back pain laterality  ? [place], [place]  ? 11 months ago Hyperlipidemia associated with type 2 diabetes mellitus ([place])  ? [place] Elsie Stain, MD  ? 1 year ago Muscle spasticity  ? [place] Elsie Stain, MD  ?  ?  ?Future Appointments   ?        ? In 1 month Joya Gaskins Burnett Harry, MD [place]  ?  ? ?  ?  ?  ? ?

## 2021-05-13 ENCOUNTER — Other Ambulatory Visit (HOSPITAL_COMMUNITY): Payer: Self-pay | Admitting: Psychiatry

## 2021-05-13 ENCOUNTER — Other Ambulatory Visit: Payer: Self-pay

## 2021-05-13 MED ORDER — QUETIAPINE FUMARATE 50 MG PO TABS
50.0000 mg | ORAL_TABLET | Freq: Every day | ORAL | 0 refills | Status: DC
  Filled 2021-05-13: qty 30, 30d supply, fill #0

## 2021-05-13 MED ORDER — SERTRALINE HCL 50 MG PO TABS
50.0000 mg | ORAL_TABLET | Freq: Every day | ORAL | 1 refills | Status: DC
  Filled 2021-05-13: qty 30, 30d supply, fill #0
  Filled 2021-06-17: qty 30, 30d supply, fill #1

## 2021-05-13 NOTE — Progress Notes (Signed)
Prozac discontinued per patient preference and Sertraline 50 mg ordered. ?

## 2021-05-13 NOTE — Progress Notes (Signed)
Per patient request, Seroquel 50 mg daily added for patient to take during the day time for optimal symptom management, equaling a daily dosage of 200 mg. ?

## 2021-05-15 ENCOUNTER — Other Ambulatory Visit: Payer: Self-pay | Admitting: Critical Care Medicine

## 2021-05-15 ENCOUNTER — Other Ambulatory Visit: Payer: Self-pay

## 2021-05-15 NOTE — Telephone Encounter (Signed)
Will forward to provider. Last ov was 10/2020 ?

## 2021-05-16 ENCOUNTER — Other Ambulatory Visit: Payer: Self-pay

## 2021-05-22 ENCOUNTER — Emergency Department (HOSPITAL_COMMUNITY): Payer: Self-pay

## 2021-05-22 ENCOUNTER — Emergency Department (HOSPITAL_COMMUNITY)
Admission: EM | Admit: 2021-05-22 | Discharge: 2021-05-22 | Disposition: A | Discharge status: Home / Self Care | Payer: Self-pay | Attending: Emergency Medicine | Admitting: Emergency Medicine

## 2021-05-22 DIAGNOSIS — R1013 Epigastric pain: Secondary | ICD-10-CM

## 2021-05-22 DIAGNOSIS — K5900 Constipation, unspecified: Secondary | ICD-10-CM | POA: Insufficient documentation

## 2021-05-22 DIAGNOSIS — K8020 Calculus of gallbladder without cholecystitis without obstruction: Secondary | ICD-10-CM | POA: Insufficient documentation

## 2021-05-22 DIAGNOSIS — Z87891 Personal history of nicotine dependence: Secondary | ICD-10-CM | POA: Insufficient documentation

## 2021-05-22 DIAGNOSIS — K8080 Other cholelithiasis without obstruction: Secondary | ICD-10-CM

## 2021-05-22 LAB — COMPREHENSIVE METABOLIC PANEL
ALT: 23 U/L (ref 0–44)
AST: 16 U/L (ref 15–41)
Albumin: 3.9 g/dL (ref 3.5–5.0)
Alkaline Phosphatase: 74 U/L (ref 38–126)
Anion gap: 5 (ref 5–15)
BUN: 14 mg/dL (ref 6–20)
CO2: 26 mmol/L (ref 22–32)
Calcium: 9.2 mg/dL (ref 8.9–10.3)
Chloride: 107 mmol/L (ref 98–111)
Creatinine, Ser: 0.98 mg/dL (ref 0.44–1.00)
GFR, Estimated: >60 mL/min (ref >60)
Glucose, Bld: 115 mg/dL — ABNORMAL HIGH (ref 70–99)
Potassium: 4.3 mmol/L (ref 3.5–5.1)
Sodium: 138 mmol/L (ref 135–145)
Total Bilirubin: 0.3 mg/dL (ref 0.3–1.2)
Total Protein: 7.7 g/dL (ref 6.5–8.1)

## 2021-05-22 LAB — CBC WITH DIFFERENTIAL/PLATELET
Abs Immature Granulocytes: 0.02 10*3/uL (ref 0.00–0.07)
Basophils Absolute: 0 10*3/uL (ref 0.0–0.1)
Basophils Relative: 1 %
Eosinophils Absolute: 0 10*3/uL (ref 0.0–0.5)
Eosinophils Relative: 0 %
HCT: 42.8 % (ref 36.0–46.0)
Hemoglobin: 13.4 g/dL (ref 12.0–15.0)
Immature Granulocytes: 0 %
Lymphocytes Relative: 11 %
Lymphs Abs: 0.9 10*3/uL (ref 0.7–4.0)
MCH: 27.2 pg (ref 26.0–34.0)
MCHC: 31.3 g/dL (ref 30.0–36.0)
MCV: 86.8 fL (ref 80.0–100.0)
Monocytes Absolute: 0.5 10*3/uL (ref 0.1–1.0)
Monocytes Relative: 6 %
Neutro Abs: 6.3 10*3/uL (ref 1.7–7.7)
Neutrophils Relative %: 82 %
Platelets: 322 10*3/uL (ref 150–400)
RBC: 4.93 MIL/uL (ref 3.87–5.11)
RDW: 13.7 % (ref 11.5–15.5)
WBC: 7.7 10*3/uL (ref 4.0–10.5)
nRBC: 0 % (ref 0.0–0.2)

## 2021-05-22 LAB — LIPASE, BLOOD: Lipase: 31 U/L (ref 11–51)

## 2021-05-22 LAB — TROPONIN I (HIGH SENSITIVITY): Troponin I (High Sensitivity): <2 ng/L (ref <18)

## 2021-05-22 LAB — I-STAT BETA HCG BLOOD, ED (MC, WL, AP ONLY): I-stat hCG, quantitative: <5 m[IU]/mL (ref <5)

## 2021-05-22 MED ORDER — ONDANSETRON HCL 4 MG PO TABS
4.0000 mg | ORAL_TABLET | ORAL | 0 refills | Status: DC | PRN
  Filled 2021-05-22: qty 8, 2d supply, fill #0

## 2021-05-22 MED ORDER — ONDANSETRON HCL 4 MG/2ML IJ SOLN
4.0000 mg | Freq: Once | INTRAMUSCULAR | Status: AC
  Administered 2021-05-22: 4 mg via INTRAVENOUS
  Filled 2021-05-22: qty 2

## 2021-05-22 MED ORDER — KETOROLAC TROMETHAMINE 15 MG/ML IJ SOLN
15.0000 mg | Freq: Once | INTRAMUSCULAR | Status: AC
  Administered 2021-05-22: 15 mg via INTRAVENOUS
  Filled 2021-05-22: qty 1

## 2021-05-22 MED ORDER — SUCRALFATE 1 G PO TABS
1.0000 g | ORAL_TABLET | Freq: Three times a day (TID) | ORAL | 0 refills | Status: DC
  Filled 2021-05-22: qty 28, 7d supply, fill #0

## 2021-05-22 MED ORDER — SODIUM CHLORIDE 0.9 % IV BOLUS
1000.0000 mL | Freq: Once | INTRAVENOUS | Status: AC
  Administered 2021-05-22: 1000 mL via INTRAVENOUS

## 2021-05-22 MED ORDER — POLYETHYLENE GLYCOL 3350 17 GM/SCOOP PO POWD
17.0000 g | Freq: Every day | ORAL | 0 refills | Status: AC
  Filled 2021-05-22: qty 238, 14d supply, fill #0

## 2021-05-22 MED ORDER — FAMOTIDINE IN NACL 20-0.9 MG/50ML-% IV SOLN
20.0000 mg | Freq: Once | INTRAVENOUS | Status: AC
Start: 2021-05-22 — End: 2021-05-22
  Administered 2021-05-22: 20 mg via INTRAVENOUS
  Filled 2021-05-22: qty 50

## 2021-05-22 MED ORDER — HYDROCODONE-ACETAMINOPHEN 5-325 MG PO TABS
1.0000 | ORAL_TABLET | ORAL | 0 refills | Status: DC | PRN
  Filled 2021-05-22: qty 10, 2d supply, fill #0

## 2021-05-22 MED ORDER — LIDOCAINE VISCOUS HCL 2 % MT SOLN
15.0000 mL | Freq: Once | OROMUCOSAL | Status: AC
Start: 2021-05-22 — End: 2021-05-22
  Administered 2021-05-22: 15 mL via ORAL
  Filled 2021-05-22: qty 15

## 2021-05-22 MED ORDER — ALUM & MAG HYDROXIDE-SIMETH 200-200-20 MG/5ML PO SUSP
30.0000 mL | Freq: Once | ORAL | Status: AC
Start: 2021-05-22 — End: 2021-05-22
  Administered 2021-05-22: 30 mL via ORAL
  Filled 2021-05-22: qty 30

## 2021-05-22 NOTE — Discharge Instructions (Addendum)
It was a pleasure caring for you today in the emergency department. ? ?Please return to the emergency department for any worsening or worrisome symptoms. ? ?Please do not drink alcohol.  No tobacco.  Avoid caffeine, chocolate, peppermint.  Please pursue low-fat diet. ?

## 2021-05-22 NOTE — ED Provider Triage Note (Signed)
Emergency Medicine Provider Triage Evaluation Note  Sarah Evans , a 41 y.o. female  was evaluated in triage.  Pt complains of multiple complaints. CP, abd pain, SOB. Began this morning. Ears rinbing and in pain x weeks. Seen here previously. Epigastric pain radiates into back. No hx of pancreatitis. No melena or BPRPR, chronic NSAID use. No hx of PE, DVT. No LE swelling, recent immobilization.   Review of Systems  Positive: CP, SOB, abd pain Negative:   Physical Exam  BP (!) 153/120 (BP Location: Left Arm)   Pulse 83   Temp 98.2 F (36.8 C) (Oral)   Resp 18   Ht 5\' 7"  (1.702 m)   Wt 88 kg   SpO2 95%   BMI 30.38 kg/m  Gen:   Awake, no distress   Resp:  Normal effort  MSK:   Moves extremities without difficulty  Other:    Medical Decision Making  Medically screening exam initiated at 3:15 PM.  Appropriate orders placed.  was informed that the remainder of the evaluation will be completed by another provider, this initial triage assessment does not replace that evaluation, and the importance of remaining in the ED until their evaluation is complete.  CP, Sob, ABD pain   Mahlia Fernando A, PA-C 05/22/21 1517

## 2021-05-22 NOTE — ED Triage Notes (Signed)
Pt states she is having upper abdominal pain. States this pain is also in her chest and the middle of her back. States this pain started today. Pt reports nausea. ?

## 2021-05-22 NOTE — ED Provider Notes (Signed)
?[facility]-EMERGENCY DEPT ?Provider Note ? ? ?CSN: [id number] ?Arrival date & time: 05/22/21  1256 ? ?  ? ?History ? ?Chief Complaint  ?Patient presents with  ? Abdominal Pain  ? ? ?Sarah Evans is a 41 y.o. female. ? ? Patient as above with significant medical history as below, including bipolar 1, PCOS, PTSD, schizophrenia who presents to the ED with complaint of epigastric pain, nausea, abdominal pain.  Patient reports onset of symptoms this morning.  Pain described as a burning, cramping sensation.  Mild nausea.  Has not tried to eat anything yet today.  P.o. intake was yesterday evening.  No recent diet or medication changes.  Patient denies urination changes, hematuria.  No abnormal vaginal bleeding or discharge.  No diarrhea or constipation was reported.  She is having some pain to her mid thoracic back area at the approximate level of her epigastric discomfort.  No medications prior to arrival.  She has not had these symptoms in the past.  Denies use of illicit drugs or alcohol ? ? ? ? ?Past Medical History: ?No date: Bipolar 1 disorder (HCC) ?No date: Herpes simplex virus (HSV) infection ?    Comment:  dx 2001 ?No date: Polycystic ovary disease ?No date: PTSD (post-traumatic stress disorder) ?No date: Schizophrenia [facility](HCC) ?No date: Stroke [facility](HCC) ? ?Past Surgical History: ?05/25/2020: BUBBLE STUDY ?    Comment:  Procedure: BUBBLE STUDY;  Surgeon: Chrystie NoseHilty, Kenneth C, MD; ?             Location: [facility];  Service: Cardiovascular;; ?No date: NO PAST SURGERIES ?05/25/2020: TEE WITHOUT CARDIOVERSION; N/A ?    Comment:  Procedure: TRANSESOPHAGEAL ECHOCARDIOGRAM (TEE);   ?             Surgeon: Chrystie NoseHilty, Kenneth C, MD;  [facility];   ?             Service: Cardiovascular;  Laterality: N/A;  ? ? ?The history is provided by the patient. No language interpreter was used.  ?Abdominal Pain ?Associated symptoms: nausea   ?Associated symptoms: no chest pain, no chills, no cough, no fever, no  hematuria, no shortness of breath and no vomiting   ? ?  ? ?Home Medications ?Prior to Admission medications   ?Medication Sig Start Date End Date Taking? Authorizing Provider  ?HYDROcodone-acetaminophen (NORCO/VICODIN) 5-325 MG tablet Take 1 tablet by mouth every 4 (four) hours as needed. 05/22/21  Yes Sloan LeiterGray, Eliud Polo A, DO  ?ondansetron (ZOFRAN) 4 MG tablet Take 1 tablet (4 mg total) by mouth every 4 (four) hours as needed for nausea or vomiting. 05/22/21  Yes Tanda RockersGray, Oneika Simonian A, DO  ?polyethylene glycol powder (MIRALAX) 17 GM/SCOOP powder Take 17 g by mouth daily for 7 days. 05/22/21 05/29/21 Yes Sloan LeiterGray, Nnamdi Dacus A, DO  ?QUEtiapine (SEROQUEL) 50 MG tablet Take 1 tablet (50 mg total) by mouth daily. 05/13/21   Penn, Cranston Neighboricely, NP  ?sertraline (ZOLOFT) 50 MG tablet Take 1 tablet (50 mg total) by mouth daily. 05/13/21   Penn, Cranston Neighboricely, NP  ?sucralfate (CARAFATE) 1 g tablet Take 1 tablet (1 g total) by mouth 4 (four) times daily -  with meals and at bedtime for 7 days. 05/22/21 05/29/21 Yes Tanda RockersGray, Deren Degrazia A, DO  ?atorvastatin (LIPITOR) 10 MG tablet Take 1 tablet (10 mg total) by mouth daily. 05/10/21   Storm FriskWright, Patrick E, MD  ?cefdinir (OMNICEF) 300 MG capsule Take 1 capsule (300 mg total) by mouth 2 (two) times daily. 01/08/21   Joycelyn ManBurnette, Jennifer  M, PA-C  ?cholecalciferol (VITAMIN D3) 25 MCG (1000 UNIT) tablet Take 2,000 Units by mouth daily. ?Patient not taking: Reported on 11/01/2020    [provider]  ?clobetasol cream (TEMOVATE) 0.05 % Apply 1 application topically 2 (two) times daily. 10/13/20   Storm Frisk, MD  ?erythromycin ophthalmic ointment Place 1 application into the right eye at bedtime. 01/08/21   Margaretann Loveless, PA-C  ?ipratropium (ATROVENT) 0.03 % nasal spray Place 2 sprays into both nostrils every 12 (twelve) hours. 01/08/21   Margaretann Loveless, PA-C  ?lamoTRIgine (LAMICTAL) 25 MG tablet Take 3 tablets (75 mg total) by mouth daily. 10/16/20   Shanna Cisco, NP  ?metFORMIN (GLUCOPHAGE) 500 MG tablet TAKE 1  TABLET BY MOUTH DAILY WITH BREAKFAST 05/15/21   Storm Frisk, MD  ?Multiple Vitamin (MULTIVITAMIN WITH MINERALS) TABS tablet Take 1 tablet by mouth daily. ?Patient not taking: Reported on 11/01/2020    [provider]  ?norgestimate-ethinyl estradiol (MILI) 0.25-35 MG-MCG tablet Take 1 tablet by mouth daily. 04/01/21   Warden Fillers, MD  ?pantoprazole (PROTONIX) 20 MG tablet Take 1 tablet (20 mg total) by mouth daily. 05/16/20   Adam Phenix, MD  ?prazosin (MINIPRESS) 2 MG capsule Take 1 capsule (2 mg total) by mouth at bedtime. 05/01/21   Mcneil Sober, NP  ?QUEtiapine Fumarate (SEROQUEL XR) 150 MG 24 hr tablet Take 1 tablet (150 mg total) by mouth at bedtime. 05/01/21   Mcneil Sober, NP  ?spironolactone (ALDACTONE) 100 MG tablet take 1 tablet by mouth twice daily 04/22/21   Storm Frisk, MD  ?tizanidine (ZANAFLEX) 6 MG capsule Take 1 capsule (6 mg total) by mouth 3 (three) times daily as needed for muscle spasms. ?Patient not taking: Reported on 11/01/2020 07/23/20   Storm Frisk, MD  ?valACYclovir (VALTREX) 1000 MG tablet Take 1 tablet (1,000 mg total) by mouth daily. 05/21/20   Storm Frisk, MD  ?   ? ?Allergies    ?Patient has no known allergies.   ? ?Review of Systems   ?Review of Systems  ?Constitutional:  Negative for chills and fever.  ?HENT:  Negative for facial swelling and trouble swallowing.   ?Eyes:  Negative for photophobia and visual disturbance.  ?Respiratory:  Negative for cough and shortness of breath.   ?Cardiovascular:  Negative for chest pain and palpitations.  ?Gastrointestinal:  Positive for abdominal pain and nausea. Negative for vomiting.  ?Endocrine: Negative for polydipsia and polyuria.  ?Genitourinary:  Negative for difficulty urinating and hematuria.  ?Musculoskeletal:  Positive for back pain. Negative for gait problem and joint swelling.  ?Skin:  Negative for pallor and rash.  ?Neurological:  Negative for syncope and headaches.  ?Psychiatric/Behavioral:   Negative for agitation and confusion.   ? ?Physical Exam ?Updated Vital Signs ?BP (!) 140/95   Pulse 80   Temp 98.2 ?F (36.8 ?C) (Oral)   Resp 18   Ht 5\' 7"  (1.702 m)   Wt 88 kg   SpO2 99%   BMI 30.38 kg/m?  ?Physical Exam ?Vitals and nursing note reviewed.  ?Constitutional:   ?   General: She is not in acute distress. ?   Appearance: Normal appearance. She is well-developed. She is not ill-appearing or diaphoretic.  ?HENT:  ?   Head: Normocephalic and atraumatic.  ?   Right Ear: External ear normal.  ?   Left Ear: External ear normal.  ?   Nose: Nose normal.  ?   Mouth/Throat:  ?  Mouth: Mucous membranes are moist.  ?Eyes:  ?   General: No scleral icterus.    ?   Right eye: No discharge.     ?   Left eye: No discharge.  ?Cardiovascular:  ?   Rate and Rhythm: Normal rate and regular rhythm.  ?   Pulses: Normal pulses.  ?   Heart sounds: Normal heart sounds.  ?Pulmonary:  ?   Effort: Pulmonary effort is normal. No respiratory distress.  ?   Breath sounds: Normal breath sounds.  ?Abdominal:  ?   General: Abdomen is flat.  ?   Palpations: Abdomen is soft.  ?   Tenderness: There is abdominal tenderness in the right upper quadrant. There is no guarding or rebound.  ? ? ?Musculoskeletal:     ?   General: Normal range of motion.  ?     Arms: ? ?   Cervical back: Normal range of motion.  ?   Right lower leg: No edema.  ?   Left lower leg: No edema.  ?   Comments: No midline spinous process tenderness palpation or step-off.  No crepitus.  ?Skin: ?   General: Skin is warm and dry.  ?   Capillary Refill: Capillary refill takes less than 2 seconds.  ?Neurological:  ?   Mental Status: She is alert and oriented to person, place, and time.  ?   GCS: GCS eye subscore is 4. GCS verbal subscore is 5. GCS motor subscore is 6.  ?Psychiatric:     ?   Mood and Affect: Mood normal.     ?   Behavior: Behavior normal.  ? ? ?ED Results / Procedures / Treatments   ?Labs ?(all labs ordered are listed, but only abnormal results are  displayed) ?Labs Reviewed  ?COMPREHENSIVE METABOLIC PANEL - Abnormal; Notable for the following components:  ?    Result Value  ? Glucose, Bld 115 (*)   ? All other components within normal limits  ?CBC WITH DIFFERENT

## 2021-05-22 NOTE — ED Notes (Signed)
Delta Troponin discontinued per DO. Patient feeling better. Food given per request and no longer NPO. Denies nausea. JRPRN ?

## 2021-05-23 ENCOUNTER — Other Ambulatory Visit: Payer: Self-pay

## 2021-05-24 ENCOUNTER — Other Ambulatory Visit: Payer: Self-pay

## 2021-05-27 ENCOUNTER — Inpatient Hospital Stay: Payer: Medicaid Other | Admitting: Critical Care Medicine

## 2021-05-27 ENCOUNTER — Other Ambulatory Visit: Payer: Self-pay

## 2021-05-27 MED ORDER — QUETIAPINE FUMARATE ER 50 MG PO TB24
100.0000 mg | ORAL_TABLET | Freq: Every day | ORAL | 3 refills | Status: DC
  Filled 2021-05-27 – 2021-06-06 (×2): qty 60, 30d supply, fill #0

## 2021-05-28 ENCOUNTER — Other Ambulatory Visit: Payer: Self-pay

## 2021-05-29 ENCOUNTER — Other Ambulatory Visit: Payer: Self-pay | Admitting: Critical Care Medicine

## 2021-05-29 ENCOUNTER — Other Ambulatory Visit (HOSPITAL_COMMUNITY): Payer: Self-pay

## 2021-05-29 ENCOUNTER — Other Ambulatory Visit: Payer: Self-pay

## 2021-05-29 ENCOUNTER — Encounter: Payer: Self-pay | Admitting: Physician Assistant

## 2021-05-29 ENCOUNTER — Other Ambulatory Visit: Payer: Self-pay | Admitting: Obstetrics & Gynecology

## 2021-05-29 DIAGNOSIS — R112 Nausea with vomiting, unspecified: Secondary | ICD-10-CM

## 2021-05-29 DIAGNOSIS — A6004 Herpesviral vulvovaginitis: Secondary | ICD-10-CM

## 2021-05-29 MED ORDER — VALACYCLOVIR HCL 1 G PO TABS
1000.0000 mg | ORAL_TABLET | Freq: Every day | ORAL | 2 refills | Status: DC
  Filled 2021-05-29: qty 60, 60d supply, fill #0
  Filled 2021-06-06: qty 30, 30d supply, fill #0
  Filled 2021-07-03: qty 60, 60d supply, fill #0
  Filled 2021-10-10: qty 60, 60d supply, fill #1
  Filled 2022-02-28: qty 60, 60d supply, fill #2

## 2021-05-30 ENCOUNTER — Other Ambulatory Visit: Payer: Self-pay | Admitting: Critical Care Medicine

## 2021-05-30 ENCOUNTER — Encounter: Payer: Self-pay | Admitting: Obstetrics and Gynecology

## 2021-05-30 ENCOUNTER — Other Ambulatory Visit (HOSPITAL_COMMUNITY)
Admission: RE | Admit: 2021-05-30 | Discharge: 2021-05-30 | Disposition: A | Discharge status: Home / Self Care | Payer: Medicaid Other | Source: Ambulatory Visit | Attending: Obstetrics and Gynecology | Admitting: Obstetrics and Gynecology

## 2021-05-30 ENCOUNTER — Ambulatory Visit (INDEPENDENT_AMBULATORY_CARE_PROVIDER_SITE_OTHER): Payer: Medicaid Other | Admitting: Obstetrics and Gynecology

## 2021-05-30 ENCOUNTER — Other Ambulatory Visit: Payer: Self-pay

## 2021-05-30 ENCOUNTER — Other Ambulatory Visit (HOSPITAL_COMMUNITY): Payer: Self-pay

## 2021-05-30 VITALS — BP 121/78 | HR 87 | Ht 67.0 in | Wt 190.8 lb

## 2021-05-30 DIAGNOSIS — Z124 Encounter for screening for malignant neoplasm of cervix: Secondary | ICD-10-CM

## 2021-05-30 DIAGNOSIS — L680 Hirsutism: Secondary | ICD-10-CM | POA: Diagnosis not present

## 2021-05-30 DIAGNOSIS — Z8742 Personal history of other diseases of the female genital tract: Secondary | ICD-10-CM | POA: Diagnosis not present

## 2021-05-30 DIAGNOSIS — Z01419 Encounter for gynecological examination (general) (routine) without abnormal findings: Secondary | ICD-10-CM

## 2021-05-30 MED ORDER — SPIRONOLACTONE 100 MG PO TABS: ORAL_TABLET | Freq: Two times a day (BID) | ORAL | 3 refills | Status: DC

## 2021-05-30 MED ORDER — ATORVASTATIN CALCIUM 10 MG PO TABS
10.0000 mg | ORAL_TABLET | Freq: Every day | ORAL | 2 refills | Status: DC
Start: 2021-05-30 — End: 2021-07-24
  Filled 2021-05-30: qty 30, 30d supply, fill #0

## 2021-05-30 NOTE — Progress Notes (Signed)
GYNECOLOGY ANNUAL PREVENTATIVE CARE ENCOUNTER NOTE  History:     Sarah Evans is a 41 y.o. G53P0100 female here for a routine annual gynecologic exam.  Current complaints: maintenance of PCOS.   Currently PCOS is controlled with OCP, metformin and spironolactone.  Pt declined STD workup/swabs.   Gynecologic History Patient's last menstrual period was 05/20/2021 (approximate). Contraception: OCP (estrogen/progesterone)  Obstetric History OB History  Gravida Para Term Preterm AB Living  1 1 0 1 0 0  SAB IAB Ectopic Multiple Live Births  0 0 0 0 1    # Outcome Date GA Lbr Len/2nd Weight Sex Delivery Anes PTL Lv  1 Preterm 11/27/15 [redacted]w[redacted]d       ND    Past Medical History:  Diagnosis Date   Bipolar 1 disorder (HCC)    Herpes simplex virus (HSV) infection    dx 2001   Polycystic ovary disease    PTSD (post-traumatic stress disorder)    Schizophrenia (HCC)    Stroke [facility])     Past Surgical History:  Procedure Laterality Date   BUBBLE STUDY  05/25/2020   Procedure: BUBBLE STUDY;  Surgeon: Chrystie Nose, MD;  Location: [facility] ENDOSCOPY;  Service: Cardiovascular;;   NO PAST SURGERIES     TEE WITHOUT CARDIOVERSION N/A 05/25/2020   Procedure: TRANSESOPHAGEAL ECHOCARDIOGRAM (TEE);  Surgeon: Chrystie Nose, MD;  Location: [facility] ENDOSCOPY;  Service: Cardiovascular;  Laterality: N/A;    Current Outpatient Medications on File Prior to Visit  Medication Sig Dispense Refill   cefdinir (OMNICEF) 300 MG capsule Take 1 capsule (300 mg total) by mouth 2 (two) times daily. 20 capsule 0   cholecalciferol (VITAMIN D3) 25 MCG (1000 UNIT) tablet Take 2,000 Units by mouth daily.     clobetasol cream (TEMOVATE) 0.05 % Apply 1 application topically 2 (two) times daily. 30 g 0   erythromycin ophthalmic ointment Place 1 application into the right eye at bedtime. 3.5 g 0   HYDROcodone-acetaminophen (NORCO/VICODIN) 5-325 MG tablet Take 1 tablet by mouth every 4 (four) hours as needed. 10 tablet 0    ipratropium (ATROVENT) 0.03 % nasal spray Place 2 sprays into both nostrils every 12 (twelve) hours. 30 mL 0   lamoTRIgine (LAMICTAL) 25 MG tablet Take 3 tablets (75 mg total) by mouth daily. 120 tablet 2   metFORMIN (GLUCOPHAGE) 500 MG tablet TAKE 1 TABLET BY MOUTH DAILY WITH BREAKFAST 60 tablet 3   norgestimate-ethinyl estradiol (MILI) 0.25-35 MG-MCG tablet Take 1 tablet by mouth daily. 28 tablet 2   ondansetron (ZOFRAN) 4 MG tablet Take 1 tablet (4 mg total) by mouth every 4 (four) hours as needed for nausea or vomiting. 8 tablet 0   pantoprazole (PROTONIX) 20 MG tablet Take 1 tablet (20 mg total) by mouth daily. 30 tablet 1   prazosin (MINIPRESS) 2 MG capsule Take 1 capsule (2 mg total) by mouth at bedtime. 30 capsule 0   QUEtiapine (SEROQUEL) 50 MG tablet Take 1 tablet (50 mg total) by mouth daily. 30 tablet 0   QUEtiapine Fumarate (SEROQUEL XR) 150 MG 24 hr tablet Take 1 tablet (150 mg total) by mouth at bedtime. 30 tablet 0   sertraline (ZOLOFT) 50 MG tablet Take 1 tablet (50 mg total) by mouth daily. 30 tablet 1   spironolactone (ALDACTONE) 100 MG tablet take 1 tablet by mouth twice daily 390 tablet 0   valACYclovir (VALTREX) 1000 MG tablet Take 1 tablet (1,000 mg total) by mouth once daily. 60 tablet 2  Multiple Vitamin (MULTIVITAMIN WITH MINERALS) TABS tablet Take 1 tablet by mouth daily. (Patient not taking: Reported on 11/01/2020)     QUEtiapine (SEROQUEL XR) 50 MG TB24 24 hr tablet Take 2 tablets (100 mg total) by mouth at bedtime. (Patient not taking: Reported on 05/30/2021) 60 tablet 3   sucralfate (CARAFATE) 1 g tablet Take 1 tablet (1 g total) by mouth 4 (four) times daily -  with meals and at bedtime for 7 days. (Patient not taking: Reported on 05/30/2021) 28 tablet 0   tizanidine (ZANAFLEX) 6 MG capsule Take 1 capsule (6 mg total) by mouth 3 (three) times daily as needed for muscle spasms. (Patient not taking: Reported on 11/01/2020) 90 capsule 1   No current facility-administered  medications on file prior to visit.    No Known Allergies  Social History:  reports that she quit smoking about 12 months ago. Her smoking use included cigarettes. She has a 3.75 pack-year smoking history. She has never used smokeless tobacco. She reports that she does not drink alcohol and does not use drugs.  Family History  Problem Relation Age of Onset   Bipolar disorder Mother    Diabetes Maternal Grandmother    Hypertension Neg Hx    Heart disease Neg Hx    Cancer Neg Hx     The following portions of the patient's history were reviewed and updated as appropriate: allergies, current medications, past family history, past medical history, past social history, past surgical history and problem list.  Review of Systems Pertinent items noted in HPI and remainder of comprehensive ROS otherwise negative.  Physical Exam:  BP 121/78   Pulse 87   Ht 5\' 7"  (1.702 m)   Wt 190 lb 12.8 oz (86.5 kg)   LMP 05/20/2021 (Approximate)   BMI 29.88 kg/m  CONSTITUTIONAL: Well-developed, well-nourished female in no acute distress.  HENT:  Normocephalic, atraumatic, External right and left ear normal. Oropharynx is clear and moist EYES: Conjunctivae and EOM are normal.  NECK: Normal range of motion, supple, no masses.  Normal thyroid.  SKIN: Skin is warm and dry. No rash noted. Not diaphoretic. No erythema. No pallor.  Some moderate hair growth underneath the chin and neck area. MUSCULOSKELETAL: Normal range of motion. No tenderness.  No cyanosis, clubbing, or edema.  2+ distal pulses. NEUROLOGIC: Alert and oriented to person, place, and time. Normal reflexes, muscle tone coordination.  PSYCHIATRIC: Normal mood and affect. Normal behavior. Normal judgment and thought content. CARDIOVASCULAR: Normal heart rate noted, regular rhythm RESPIRATORY: Clear to auscultation bilaterally. Effort and breath sounds normal, no problems with respiration noted. BREASTS: Symmetric in size. No masses, tenderness,  skin changes, nipple drainage, or lymphadenopathy bilaterally. Performed in the presence of a chaperone. ABDOMEN: Soft, no distention noted.  No tenderness, rebound or guarding.  PELVIC: Normal appearing external genitalia and urethral meatus; normal appearing vaginal mucosa and cervix.  No abnormal discharge noted.  Pap smear obtained.  Normal uterine size, no other palpable masses, no uterine or adnexal tenderness.  Performed in the presence of a chaperone.   Assessment and Plan:    1. Cervical cancer screening  - Cytology - PAP( [place])  2. Women's annual routine gynecological examination Normal annual exam, mammogram in 1 year  3. History of PCOS Current symptoms are being managed with current regimen, refilled spironolactone. Advised continued weight loss to decrease insulin resistance.  Pt states she may get injectable GLP if her insurance approves it. - spironolactone (ALDACTONE) 100 MG tablet; Take  1 tablet by mouth twice daily  Dispense: 180 tablet; Refill: 3  4. Hirsutism Continue symptomatic treatment with spironolactone - spironolactone (ALDACTONE) 100 MG tablet; Take 1 tablet by mouth twice daily  Dispense: 180 tablet; Refill: 3  Will follow up results of pap smear and manage accordingly. Routine preventative health maintenance measures emphasized. Please refer to After Visit Summary for other counseling recommendations.      Mariel Aloe, MD, FACOG Obstetrician & Gynecologist, [facility], [facility]

## 2021-05-31 ENCOUNTER — Other Ambulatory Visit: Payer: Self-pay

## 2021-06-04 LAB — CYTOLOGY - PAP
Comment: NEGATIVE
Diagnosis: NEGATIVE
High risk HPV: NEGATIVE

## 2021-06-06 ENCOUNTER — Other Ambulatory Visit: Payer: Self-pay

## 2021-06-06 ENCOUNTER — Other Ambulatory Visit (HOSPITAL_COMMUNITY): Payer: Self-pay | Admitting: Critical Care Medicine

## 2021-06-06 ENCOUNTER — Other Ambulatory Visit: Payer: Self-pay | Admitting: Critical Care Medicine

## 2021-06-06 DIAGNOSIS — R112 Nausea with vomiting, unspecified: Secondary | ICD-10-CM

## 2021-06-06 DIAGNOSIS — F250 Schizoaffective disorder, bipolar type: Secondary | ICD-10-CM

## 2021-06-06 NOTE — Telephone Encounter (Signed)
Medication Refill - Medication: HYDROcodone-acetaminophen (NORCO/VICODIN) 5-325 MG tablet and cefdinir (OMNICEF) 300 MG capsule  Has the patient contacted their pharmacy? Yes.   Pt told to contact pcp  Preferred Pharmacy (with phone number or street name):  [facility] Phone:  [phone]  Fax:  [phone]     Has the patient been seen for an appointment in the last year OR does the patient have an upcoming appointment? Yes.    Agent: Please be advised that RX refills may take up to 3 business days. We ask that you follow-up with your pharmacy.

## 2021-06-07 NOTE — Telephone Encounter (Signed)
Requested medication (s) are due for refill today: yes  Requested medication (s) are on the active medication list: yes  Last refill:  05/22/21 #10 0 refills  Future visit scheduled: yes in 1 week  Notes to clinic:  not delegated per protocol. Last ordered by Sloan Leiter DO. Do you want to refill Rx?     Requested Prescriptions  Pending Prescriptions Disp Refills   HYDROcodone-acetaminophen (NORCO/VICODIN) 5-325 MG tablet 10 tablet 0    Sig: Take 1 tablet by mouth every 4 (four) hours as needed.     Not Delegated - Analgesics:  Opioid Agonist Combinations Failed - 06/06/2021 12:47 PM      Failed - This refill cannot be delegated      Failed - Urine Drug Screen completed in last 360 days      Passed - Valid encounter within last 3 months    Recent Outpatient Visits           1 month ago Obesity (BMI 30-39.9)   [place] [facility] [place], [place] W, NP   7 months ago History of ischemic stroke   [facility] Storm Frisk, MD   7 months ago Chronic thoracic back pain, unspecified back pain laterality   [facility] [place], [place]ari S, [place]   1 year ago Hyperlipidemia associated with type 2 diabetes mellitus [facility])   [facility] Storm Frisk, MD   1 year ago Muscle spasticity   [facility] Storm Frisk, MD       Future Appointments             In 1 week Storm Frisk, MD [facility]   In 2 weeks Claiborne Rigg, NP [facility]

## 2021-06-10 ENCOUNTER — Ambulatory Visit: Payer: Self-pay | Admitting: *Deleted

## 2021-06-10 ENCOUNTER — Other Ambulatory Visit: Payer: Self-pay

## 2021-06-10 NOTE — Telephone Encounter (Signed)
  Chief Complaint: abdominal pain requesting medication  Symptoms: hx gallstones. Upper and lower abdominal pain, comes and goes  nausea requesting refill hydrocodone ordered by different provider Frequency: since 05/22/21 Pertinent Negatives: Patient denies difficulty breathing, no fever Disposition: [] ED /[x] Urgent Care (no appt availability in office) / [] Appointment(In office/virtual)/ []  [place] [facility]/ [] Home Care/ [x] Refused Recommended Disposition /[x] [facility]/ []  Follow-up with PCP Additional Notes:   Requesting medication hydrocodone. Requesting if appt 06/20/21 can be VV instead of in office due to abdominal pain . Requesting a call  back      Reason for Disposition  [1] MODERATE pain (e.g., interferes with normal activities) AND [2] pain comes and goes (cramps) AND [3] present > 24 hours  (Exception: pain with Vomiting or Diarrhea - see that Guideline)  Answer Assessment - Initial Assessment Questions 1. LOCATION: "Where does it hurt?"      Low abdomen and sometimes upper abdominal  2. RADIATION: "Does the pain shoot anywhere else?" (e.g., chest, back)     No  3. ONSET: "When did the pain begin?" (e.g., minutes, hours or days ago)      Since 05/22/21 4. SUDDEN: "Gradual or sudden onset?"     Gradual  5. PATTERN "Does the pain come and go, or is it constant?"    - If constant: "Is it getting better, staying the same, or worsening?"      (Note: Constant means the pain never goes away completely; most serious pain is constant and it progresses)     - If intermittent: "How long does it last?" "Do you have pain now?"     (Note: Intermittent means the pain goes away completely between bouts)     Comes and goes worse after eating  6. SEVERITY: "How bad is the pain?"  (e.g., Scale 1-10; mild, moderate, or severe)   - MILD (1-3): doesn't interfere with normal activities, abdomen soft and not tender to touch    - MODERATE (4-7): interferes with normal activities  or awakens from sleep, abdomen tender to touch    - SEVERE (8-10): excruciating pain, doubled over, unable to do any normal activities      Moderate abdomen tender to touch upper abdomen  7. RECURRENT SYMPTOM: "Have you ever had this type of stomach pain before?" If Yes, ask: "When was the last time?" and "What happened that time?"      Yes  8. CAUSE: "What do you think is causing the stomach pain?"     Gall stone s 9. RELIEVING/AGGRAVATING FACTORS: "What makes it better or worse?" (e.g., movement, antacids, bowel movement)     na 10. OTHER SYMPTOMS: "Do you have any other symptoms?" (e.g., back pain, diarrhea, fever, urination pain, vomiting)       Nausea  11. PREGNANCY: "Is there any chance you are pregnant?" "When was your last menstrual period?"       na  Protocols used: Abdominal Pain - [facility]

## 2021-06-10 NOTE — Telephone Encounter (Signed)
Per pt request, changing appt to virtual   Also requesting medication

## 2021-06-10 NOTE — Telephone Encounter (Signed)
Called to let pt know about appt change  Was unable to get in contact with pt no m

## 2021-06-17 ENCOUNTER — Other Ambulatory Visit (HOSPITAL_COMMUNITY): Payer: Self-pay

## 2021-06-17 ENCOUNTER — Other Ambulatory Visit: Payer: Self-pay | Admitting: Obstetrics & Gynecology

## 2021-06-17 ENCOUNTER — Other Ambulatory Visit: Payer: Self-pay

## 2021-06-17 DIAGNOSIS — R112 Nausea with vomiting, unspecified: Secondary | ICD-10-CM

## 2021-06-18 ENCOUNTER — Encounter: Payer: Self-pay | Admitting: Nurse Practitioner

## 2021-06-18 ENCOUNTER — Other Ambulatory Visit: Payer: Self-pay

## 2021-06-20 ENCOUNTER — Encounter: Payer: Self-pay | Admitting: Critical Care Medicine

## 2021-06-20 ENCOUNTER — Other Ambulatory Visit: Payer: Self-pay

## 2021-06-20 ENCOUNTER — Telehealth (HOSPITAL_BASED_OUTPATIENT_CLINIC_OR_DEPARTMENT_OTHER): Payer: Medicaid Other | Admitting: Critical Care Medicine

## 2021-06-20 DIAGNOSIS — Z6829 Body mass index (BMI) 29.0-29.9, adult: Secondary | ICD-10-CM

## 2021-06-20 DIAGNOSIS — R7303 Prediabetes: Secondary | ICD-10-CM

## 2021-06-20 DIAGNOSIS — I639 Cerebral infarction, unspecified: Secondary | ICD-10-CM

## 2021-06-20 DIAGNOSIS — K8020 Calculus of gallbladder without cholecystitis without obstruction: Secondary | ICD-10-CM

## 2021-06-20 DIAGNOSIS — E282 Polycystic ovarian syndrome: Secondary | ICD-10-CM

## 2021-06-20 DIAGNOSIS — F4310 Post-traumatic stress disorder, unspecified: Secondary | ICD-10-CM

## 2021-06-20 DIAGNOSIS — G4730 Sleep apnea, unspecified: Secondary | ICD-10-CM | POA: Insufficient documentation

## 2021-06-20 DIAGNOSIS — F331 Major depressive disorder, recurrent, moderate: Secondary | ICD-10-CM

## 2021-06-20 DIAGNOSIS — Z9152 Personal history of nonsuicidal self-harm: Secondary | ICD-10-CM

## 2021-06-20 DIAGNOSIS — D500 Iron deficiency anemia secondary to blood loss (chronic): Secondary | ICD-10-CM

## 2021-06-20 DIAGNOSIS — G8929 Other chronic pain: Secondary | ICD-10-CM

## 2021-06-20 DIAGNOSIS — E782 Mixed hyperlipidemia: Secondary | ICD-10-CM

## 2021-06-20 DIAGNOSIS — M542 Cervicalgia: Secondary | ICD-10-CM

## 2021-06-20 DIAGNOSIS — G4733 Obstructive sleep apnea (adult) (pediatric): Secondary | ICD-10-CM

## 2021-06-20 DIAGNOSIS — A609 Anogenital herpesviral infection, unspecified: Secondary | ICD-10-CM

## 2021-06-20 DIAGNOSIS — K219 Gastro-esophageal reflux disease without esophagitis: Secondary | ICD-10-CM

## 2021-06-20 DIAGNOSIS — F250 Schizoaffective disorder, bipolar type: Secondary | ICD-10-CM

## 2021-06-20 MED ORDER — METFORMIN HCL 500 MG PO TABS
500.0000 mg | ORAL_TABLET | Freq: Every day | ORAL | 3 refills | Status: DC
Start: 2021-06-20 — End: 2022-08-12
  Filled 2021-06-20: qty 30, 30d supply, fill #0
  Filled 2021-07-03: qty 60, 60d supply, fill #0
  Filled 2021-07-17: qty 60, 60d supply, fill #1

## 2021-06-20 MED ORDER — LAMOTRIGINE 25 MG PO TABS
75.0000 mg | ORAL_TABLET | Freq: Every day | ORAL | 2 refills | Status: DC
  Filled 2021-06-20: qty 90, 30d supply, fill #0

## 2021-06-20 MED ORDER — QUETIAPINE FUMARATE 50 MG PO TABS
50.0000 mg | ORAL_TABLET | Freq: Every day | ORAL | 1 refills | Status: DC
  Filled 2021-06-20 – 2021-07-03 (×2): qty 30, 30d supply, fill #0

## 2021-06-20 MED ORDER — PRAZOSIN HCL 2 MG PO CAPS
2.0000 mg | ORAL_CAPSULE | Freq: Every day | ORAL | 0 refills | Status: DC
  Filled 2021-06-20: qty 30, 30d supply, fill #0

## 2021-06-20 MED ORDER — NORGESTIMATE-ETH ESTRADIOL 0.25-35 MG-MCG PO TABS
1.0000 | ORAL_TABLET | Freq: Every day | ORAL | 2 refills | Status: DC
  Filled 2021-06-20: qty 28, 28d supply, fill #0

## 2021-06-20 MED ORDER — SEMAGLUTIDE (1 MG/DOSE) 4 MG/3ML ~~LOC~~ SOPN
1.0000 mg | PEN_INJECTOR | SUBCUTANEOUS | 3 refills | Status: DC
  Filled 2021-06-20 – 2021-07-17 (×2): qty 3, 28d supply, fill #0
  Filled 2021-08-07: qty 3, 28d supply, fill #1
  Filled 2021-09-05: qty 3, 28d supply, fill #2

## 2021-06-20 MED ORDER — CLOBETASOL PROPIONATE 0.05 % EX CREA
1.0000 | TOPICAL_CREAM | Freq: Two times a day (BID) | CUTANEOUS | 0 refills | Status: DC
Start: 2021-06-20 — End: 2022-08-12
  Filled 2021-06-20: qty 30, 15d supply, fill #0
  Filled 2021-07-12: qty 30, 30d supply, fill #0
  Filled 2021-08-12: qty 30, 15d supply, fill #0

## 2021-06-20 MED ORDER — SERTRALINE HCL 50 MG PO TABS
50.0000 mg | ORAL_TABLET | Freq: Every day | ORAL | 1 refills | Status: DC
  Filled 2021-07-03: qty 30, 30d supply, fill #0

## 2021-06-20 MED ORDER — QUETIAPINE FUMARATE ER 150 MG PO TB24
150.0000 mg | ORAL_TABLET | Freq: Every day | ORAL | 0 refills | Status: DC
  Filled 2021-06-20 – 2021-07-03 (×2): qty 30, 30d supply, fill #0

## 2021-06-20 MED ORDER — ONDANSETRON HCL 4 MG PO TABS
4.0000 mg | ORAL_TABLET | ORAL | 0 refills | Status: DC | PRN
  Filled 2021-06-20 – 2021-07-17 (×2): qty 8, 2d supply, fill #0

## 2021-06-20 MED ORDER — PANTOPRAZOLE SODIUM 20 MG PO TBEC
20.0000 mg | DELAYED_RELEASE_TABLET | Freq: Every day | ORAL | 1 refills | Status: DC
  Filled 2021-06-20: qty 30, 30d supply, fill #0

## 2021-06-20 NOTE — Assessment & Plan Note (Signed)
Continue therapy per mental health

## 2021-06-20 NOTE — Assessment & Plan Note (Signed)
Advised to avoid opiates

## 2021-06-20 NOTE — Assessment & Plan Note (Signed)
Has refills of Minipress

## 2021-06-20 NOTE — Progress Notes (Signed)
Established Patient Office Visit  Subjective:  Patient ID: Sarah Evans, female    DOB: August 29, 1980  Age: 41 y.o. MRN: [id number] Virtual Visit via Video Note  I connected with Kerry Dory on 06/20/21 at 11am by a video enabled telemedicine application and verified that I am speaking with the correct person using two identifiers.   Consent:  I discussed the limitations, risks, security and privacy concerns of performing an evaluation and management service by video visit and the availability of in person appointments. I also discussed with the patient that there may be a patient responsible charge related to this service. The patient expressed understanding and agreed to proceed.  Location of patient: Patient home in her bedroom  Location of provider: I am in my office  Persons participating in the televisit with the patient.    No one else on the call   History of Present Illness:     CC:  Primary care follow-up  HPI 10/2020 LEINANI LISBON presents for assistance with filling out mental and physical residual functional capacity assessments.  The patient had a significant stroke earlier this year leaving the patient with left upper extremity weakness and pain.  She also has chronic midthoracic and neck pain as well.  Patient has hyperlipidemia with type 2 diabetes and severe bipolar disorder and severe depression.  She has had psychotic features but not full-blown schizophrenia.  Despite treatment with mental health her depression and bipolar disorder have been significantly impacting her abilities.    The patient has not been able to perform her job and has been out of work for some time.  She sits at home is not able to engage in any social interactions.  Note she is not suicidal.  She was not able to focus on her job and the back pain and left upper extremity weakness and pain have created a situation where she drops objects frequently.  She also has left leg weakness cannot  walk.  She has chronic headaches dizziness and feels faint.  The patient also has visual acuity issues in the left eye after the stroke  On arrival blood pressure is good 118/80.  Her diabetes has been reasonably controlled on metformin alone.  She is on statin therapy along with medication for blood pressure.  Minimal residual functional assessment is done as outlined below and per the forms functional residual capacity also is done per forms below.  6/15   This patient is seen today by way of a video visit.  She is in her bedroom at home.  She is in no distress on the video.  Her chief complaint is that of increased weight gain and considering potential Ozempic.  Unfortunately she only has family-planning Medicaid.  She works as a Scientist, water quality at [employer].  She works about 20 hours a week.  She only walks about 3000 steps a day.  She eats a lot of vegetables in her diet.  Her weight has increased and her BMI is 30.  Patient does have prediabetes.  A1c has been elevated in the past needs to be rechecked.  Patient's had previous stroke and is on statin therapy.  Was in the ER in May for abdominal pain found to have cholelithiasis.  She was to follow-up with general surgery for potential resection but this is yet to occur.  Patient also has bipolar disorder and needs refills on her Seroquel.  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  There are no other complaints. See Epworth scale total score is 15 the patient was given Norco in the emergency room in May she still has a bottle of medicine and takes it rarely Patient had a Pap smear in May was normal Past Medical History:  Diagnosis Date   Bipolar 1 disorder ([place])    Herpes simplex virus (HSV) infection    dx 2001   Polycystic ovary disease    PTSD (post-traumatic stress disorder)    Schizophrenia ([place])    Stroke [facility])     Past Surgical History:  Procedure Laterality  Date   BUBBLE STUDY  05/25/2020   Procedure: BUBBLE STUDY;  Surgeon: Pixie Casino, MD;  Location: [place];  Service: Cardiovascular;;   NO PAST SURGERIES     TEE WITHOUT CARDIOVERSION N/A 05/25/2020   Procedure: TRANSESOPHAGEAL ECHOCARDIOGRAM (TEE);  Surgeon: Pixie Casino, MD;  [facility] ENDOSCOPY;  Service: Cardiovascular;  Laterality: N/A;    Family History  Problem Relation Age of Onset   Bipolar disorder Mother    Diabetes Maternal Grandmother    Hypertension Neg Hx    Heart disease Neg Hx    Cancer Neg Hx     Social History   Socioeconomic History   Marital status: Single    Spouse name: Not on file   Number of children: Not on file   Years of education: Not on file   Highest education level: Not on file  Occupational History   Not on file  Tobacco Use   Smoking status: Former    Packs/day: 0.25    Years: 15.00    Total pack years: 3.75    Types: Cigarettes    Quit date: 05/02/2020    Years since quitting: 1.1   Smokeless tobacco: Never  Vaping Use   Vaping Use: Never used  Substance and Sexual Activity   Alcohol use: No   Drug use: No   Sexual activity: Not Currently    Birth control/protection: Abstinence  Other Topics Concern   Not on file  Social History Narrative   Not on file   Social Determinants of Health   Financial Resource Strain: Not on file  Food Insecurity: Not on file  Transportation Needs: Not on file  Physical Activity: Not on file  Stress: Not on file  Social Connections: Not on file  Intimate Partner Violence: Not on file    Outpatient Medications Prior to Visit  Medication Sig Dispense Refill   atorvastatin (LIPITOR) 10 MG tablet Take 1 tablet (10 mg total) by mouth daily. 30 tablet 2   cholecalciferol (VITAMIN D3) 25 MCG (1000 UNIT) tablet Take 2,000 Units by mouth daily.     clobetasol cream (TEMOVATE) 5.59 % Apply 1 application topically 2 (two) times daily. 30 g 0   HYDROcodone-acetaminophen (NORCO/VICODIN)  5-325 MG tablet Take 1 tablet by mouth every 4 (four) hours as needed. 10 tablet 0   ipratropium (ATROVENT) 0.03 % nasal spray Place 2 sprays into both nostrils every 12 (twelve) hours. 30 mL 0   lamoTRIgine (LAMICTAL) 25 MG tablet Take 3 tablets (75 mg total) by mouth daily. 120 tablet 2   metFORMIN (GLUCOPHAGE) 500 MG tablet TAKE 1 TABLET BY MOUTH DAILY WITH BREAKFAST 60 tablet 3   Multiple Vitamin (MULTIVITAMIN WITH MINERALS) TABS tablet Take 1 tablet by mouth daily.     norgestimate-ethinyl estradiol (MILI) 0.25-35 MG-MCG tablet Take 1 tablet by mouth daily.  28 tablet 2   ondansetron (ZOFRAN) 4 MG tablet Take 1 tablet (4 mg total) by mouth every 4 (four) hours as needed for nausea or vomiting. 8 tablet 0   pantoprazole (PROTONIX) 20 MG tablet Take 1 tablet (20 mg total) by mouth daily. 30 tablet 1   prazosin (MINIPRESS) 2 MG capsule Take 1 capsule (2 mg total) by mouth at bedtime. 30 capsule 0   QUEtiapine (SEROQUEL XR) 50 MG TB24 24 hr tablet Take 2 tablets (100 mg total) by mouth at bedtime. 60 tablet 3   sertraline (ZOLOFT) 50 MG tablet Take 1 tablet (50 mg total) by mouth daily. 30 tablet 1   spironolactone (ALDACTONE) 100 MG tablet take 1 tablet by mouth twice daily 390 tablet 0   spironolactone (ALDACTONE) 100 MG tablet Take 1 tablet by mouth twice daily 180 tablet 3   valACYclovir (VALTREX) 1000 MG tablet Take 1 tablet (1,000 mg total) by mouth once daily. 60 tablet 2   cefdinir (OMNICEF) 300 MG capsule Take 1 capsule (300 mg total) by mouth 2 (two) times daily. 20 capsule 0   erythromycin ophthalmic ointment Place 1 application into the right eye at bedtime. 3.5 g 0   QUEtiapine (SEROQUEL) 50 MG tablet Take 1 tablet (50 mg total) by mouth daily. 30 tablet 0   QUEtiapine Fumarate (SEROQUEL XR) 150 MG 24 hr tablet Take 1 tablet (150 mg total) by mouth at bedtime. 30 tablet 0   tizanidine (ZANAFLEX) 6 MG capsule Take 1 capsule (6 mg total) by mouth 3 (three) times daily as needed for  muscle spasms. 90 capsule 1   sucralfate (CARAFATE) 1 g tablet Take 1 tablet (1 g total) by mouth 4 (four) times daily -  with meals and at bedtime for 7 days. (Patient not taking: Reported on 05/30/2021) 28 tablet 0   No facility-administered medications prior to visit.    No Known Allergies  ROS Review of Systems  Constitutional:  Positive for fatigue. Negative for activity change.  HENT: Negative.  Negative for ear pain, postnasal drip, rhinorrhea, sinus pressure, sore throat, trouble swallowing and voice change.   Eyes:  Negative for visual disturbance.  Respiratory: Negative.  Negative for apnea, cough, choking, chest tightness, shortness of breath, wheezing and stridor.   Cardiovascular: Negative.  Negative for chest pain, palpitations and leg swelling.  Gastrointestinal:  Positive for abdominal pain. Negative for abdominal distention, nausea and vomiting.  Genitourinary: Negative.   Musculoskeletal:  Negative for arthralgias, back pain, gait problem, myalgias, neck pain and neck stiffness.  Skin: Negative.  Negative for rash.  Allergic/Immunologic: Negative.  Negative for environmental allergies and food allergies.  Neurological:  Positive for tremors, weakness, light-headedness, numbness and headaches. Negative for dizziness, syncope and facial asymmetry.  Hematological: Negative.  Negative for adenopathy. Does not bruise/bleed easily.  Psychiatric/Behavioral:  Positive for decreased concentration, dysphoric mood and sleep disturbance. Negative for agitation, behavioral problems, confusion, hallucinations, self-injury and suicidal ideas. The patient is nervous/anxious. The patient is not hyperactive.       Objective:  No exam this is a video visit patient is in no distress   LMP 05/20/2021 (Approximate)  Wt Readings from Last 3 Encounters:  05/30/21 190 lb 12.8 oz (86.5 kg)  05/22/21 194 lb (88 kg)  04/11/21 195 lb (88.5 kg)     Health Maintenance Due  Topic Date Due    COVID-19 Vaccine (1) Never done   OPHTHALMOLOGY EXAM  Never done   HEMOGLOBIN A1C  12/15/2020  FOOT EXAM  05/21/2021    There are no preventive care reminders to display for this patient.  Lab Results  Component Value Date   TSH 2.110 04/02/2020   Lab Results  Component Value Date   WBC 7.7 05/22/2021   HGB 13.4 05/22/2021   HCT 42.8 05/22/2021   MCV 86.8 05/22/2021   PLT 322 05/22/2021   Lab Results  Component Value Date   NA 138 05/22/2021   K 4.3 05/22/2021   CO2 26 05/22/2021   GLUCOSE 115 (H) 05/22/2021   BUN 14 05/22/2021   CREATININE 0.98 05/22/2021   BILITOT 0.3 05/22/2021   ALKPHOS 74 05/22/2021   AST 16 05/22/2021   ALT 23 05/22/2021   PROT 7.7 05/22/2021   ALBUMIN 3.9 05/22/2021   CALCIUM 9.2 05/22/2021   ANIONGAP 5 05/22/2021   EGFR 68 05/21/2020   Lab Results  Component Value Date   CHOL 143 05/24/2020   Lab Results  Component Value Date   HDL 52 05/24/2020   Lab Results  Component Value Date   LDLCALC 63 05/24/2020   Lab Results  Component Value Date   TRIG 142 05/24/2020   Lab Results  Component Value Date   CHOLHDL 2.8 05/24/2020   Lab Results  Component Value Date   HGBA1C 5.7 (H) 06/15/2020  Abd [place] 05/2021 Result Date: 05/22/2021 CLINICAL DATA:  Right upper quadrant abdominal pain. EXAM: ULTRASOUND ABDOMEN LIMITED RIGHT UPPER QUADRANT COMPARISON:  None Available. FINDINGS: Gallbladder: Cholelithiasis is noted without gallbladder wall thickening or pericholecystic fluid. Largest calculus measures 1.9 cm. No sonographic Murphy's sign is noted. Common bile duct: Diameter: 4 mm which is within normal limits. Liver: No focal lesion identified. Within normal limits in parenchymal echogenicity. Portal vein is patent on color Doppler imaging with normal direction of blood flow towards the liver. Other: None. IMPRESSION: Cholelithiasis without cholecystitis. Electronically Signed   By: Marijo Conception M.D.   On: 05/22/2021 20:29           Assessment & Plan:   Problem List Items Addressed This Visit       Cardiovascular and Mediastinum   Ischemic stroke ([place])    Blood pressure previously controlled during ER visits Continue statin therapy and obtain lipids         Respiratory   Sleep apnea    Symptom complex compatible with sleep apnea  Epworth scale 15  She has no insurance  We will await her getting insurance from her employment then we will consider a home sleep study        Digestive   Cholelithiasis    Cholelithiasis no cholecystitis would benefit from surgical consult need to wait till she achieves insurance she is uninsured currently        Other   Schizoaffective disorder, bipolar type ([place])    Patient needs refills on mental health medications will prescribe Seroquel 50 mg a.m. 150 mg p.m.      PTSD (post-traumatic stress disorder)    Has refills of Minipress      Moderate episode of recurrent major depressive disorder ([place])    Continue therapy per mental health      Personal history of nonsuicidal self-harm    Not currently suicidal      Prediabetes - Primary    Went over patient food is medicine diet need repeat A1c we will begin Ozempic with patient assistance      Mixed hyperlipidemia    Continue statins  BMI 29.0-29.9,adult    Went over with the patient a food is medicine diet and exercise plan  She has prediabetes  She would benefit from [place] from a prediabetic standpoint we will order with patient assistance      HSV (herpes simplex virus) anogenital infection    Refill on acyclovir given      Chronic neck pain    Advised to avoid opiates      Other Visit Diagnoses     PCOS (polycystic ovarian syndrome)         Pap smear normal in May patient knows she will need to get an eye exam No orders of the defined types were placed in this encounter.   Follow Up Instructions: Patient knows lab orders will be entered for hemoglobin A1c and other needed  labs  Patient does not have Medicaid therefore cannot obtain a home sleep study or surgery referral for her gallbladder.  Patient states she will be getting insurance at her job by June 23 begin to wait till then for any other referrals for this patient or other imaging studies or sleep study  We will get this patient back in to see me face-to-face next 4 to 6 weeks   I discussed the assessment and treatment plan with the patient. The patient was provided an opportunity to ask questions and all were answered. The patient agreed with the plan and demonstrated an understanding of the instructions.   The patient was advised to call back or seek an in-person evaluation if the symptoms worsen or if the condition fails to improve as anticipated.  I provided 40 minutes of non-face-to-face time during this encounter  including  median intraservice time , review of notes, labs, imaging, medications  and explaining diagnosis and management to the patient .    Asencion Noble, MD

## 2021-06-20 NOTE — Assessment & Plan Note (Signed)
Symptom complex compatible with sleep apnea  Epworth scale 15  She has no insurance  We will await her getting insurance from her employment then we will consider a home sleep study

## 2021-06-20 NOTE — Assessment & Plan Note (Signed)
Blood pressure previously controlled during ER visits Continue statin therapy and obtain lipids

## 2021-06-20 NOTE — Assessment & Plan Note (Signed)
Went over with the patient a food is medicine diet and exercise plan  She has prediabetes  She would benefit from Ozempic from a prediabetic standpoint we will order with patient assistance

## 2021-06-20 NOTE — Assessment & Plan Note (Signed)
Went over patient food is medicine diet need repeat A1c we will begin Ozempic with patient assistance

## 2021-06-20 NOTE — Assessment & Plan Note (Signed)
-  Continue statins ?

## 2021-06-20 NOTE — Assessment & Plan Note (Signed)
Not currently suicidal °

## 2021-06-20 NOTE — Assessment & Plan Note (Signed)
Refill on acyclovir given.  

## 2021-06-20 NOTE — Assessment & Plan Note (Signed)
Cholelithiasis no cholecystitis would benefit from surgical consult need to wait till she achieves insurance she is uninsured currently

## 2021-06-20 NOTE — Assessment & Plan Note (Signed)
Patient needs refills on mental health medications will prescribe Seroquel 50 mg a.m. 150 mg p.m.

## 2021-06-21 ENCOUNTER — Other Ambulatory Visit: Payer: Self-pay

## 2021-06-21 ENCOUNTER — Telehealth: Payer: Self-pay

## 2021-06-21 NOTE — Telephone Encounter (Signed)
Called and left vm for pt to get appt in 4-6 weeks

## 2021-06-24 ENCOUNTER — Other Ambulatory Visit: Payer: Self-pay

## 2021-06-25 ENCOUNTER — Other Ambulatory Visit: Payer: Self-pay

## 2021-06-26 ENCOUNTER — Other Ambulatory Visit: Payer: Self-pay

## 2021-06-26 ENCOUNTER — Inpatient Hospital Stay: Payer: Medicaid Other | Admitting: Nurse Practitioner

## 2021-06-28 ENCOUNTER — Other Ambulatory Visit: Payer: Self-pay

## 2021-07-01 ENCOUNTER — Other Ambulatory Visit: Payer: Self-pay

## 2021-07-03 ENCOUNTER — Other Ambulatory Visit: Payer: Self-pay

## 2021-07-04 ENCOUNTER — Other Ambulatory Visit: Payer: Self-pay

## 2021-07-05 ENCOUNTER — Other Ambulatory Visit: Payer: Medicaid Other

## 2021-07-11 ENCOUNTER — Other Ambulatory Visit: Payer: Medicaid Other

## 2021-07-12 ENCOUNTER — Other Ambulatory Visit: Payer: Self-pay

## 2021-07-16 ENCOUNTER — Other Ambulatory Visit: Payer: Self-pay

## 2021-07-17 ENCOUNTER — Telehealth: Payer: Self-pay | Admitting: Emergency Medicine

## 2021-07-17 ENCOUNTER — Other Ambulatory Visit: Payer: Self-pay

## 2021-07-17 ENCOUNTER — Ambulatory Visit: Payer: Medicaid Other | Attending: Critical Care Medicine

## 2021-07-17 DIAGNOSIS — K219 Gastro-esophageal reflux disease without esophagitis: Secondary | ICD-10-CM

## 2021-07-17 DIAGNOSIS — D500 Iron deficiency anemia secondary to blood loss (chronic): Secondary | ICD-10-CM

## 2021-07-17 DIAGNOSIS — Z6829 Body mass index (BMI) 29.0-29.9, adult: Secondary | ICD-10-CM

## 2021-07-17 DIAGNOSIS — R7303 Prediabetes: Secondary | ICD-10-CM

## 2021-07-17 DIAGNOSIS — E782 Mixed hyperlipidemia: Secondary | ICD-10-CM

## 2021-07-17 DIAGNOSIS — E282 Polycystic ovarian syndrome: Secondary | ICD-10-CM

## 2021-07-17 NOTE — Telephone Encounter (Signed)
Copied from CRM [id number]. Topic: General - Other >> Jul 17, 2021  1:42 PM Sarah Evans wrote: Patient requesting change of current birth control  to [facility] w/ a  12 mo supply due to current birth control causing cycles twice a month  Patient also states she was unaware of provider prescribing spironolactone (ALDACTONE) 100 MG tablet and pharamcy will not fill w/o provider's permission  Please assist patient further

## 2021-07-18 ENCOUNTER — Other Ambulatory Visit: Payer: Self-pay

## 2021-07-18 ENCOUNTER — Other Ambulatory Visit: Payer: Self-pay | Admitting: Critical Care Medicine

## 2021-07-18 DIAGNOSIS — F250 Schizoaffective disorder, bipolar type: Secondary | ICD-10-CM

## 2021-07-18 LAB — COMPREHENSIVE METABOLIC PANEL
ALT: 22 IU/L (ref 0–32)
AST: 18 IU/L (ref 0–40)
Albumin/Globulin Ratio: 1.3 (ref 1.2–2.2)
Albumin: 4.3 g/dL (ref 3.9–4.9)
Alkaline Phosphatase: 105 IU/L (ref 44–121)
BUN/Creatinine Ratio: 14 (ref 9–23)
BUN: 16 mg/dL (ref 6–20)
Bilirubin Total: 0.6 mg/dL (ref 0.0–1.2)
CO2: 21 mmol/L (ref 20–29)
Calcium: 9.4 mg/dL (ref 8.7–10.2)
Chloride: 99 mmol/L (ref 96–106)
Creatinine, Ser: 1.14 mg/dL — ABNORMAL HIGH (ref 0.57–1.00)
Globulin, Total: 3.2 g/dL (ref 1.5–4.5)
Glucose: 90 mg/dL (ref 70–99)
Potassium: 5 mmol/L (ref 3.5–5.2)
Sodium: 137 mmol/L (ref 134–144)
Total Protein: 7.5 g/dL (ref 6.0–8.5)
eGFR: 63 mL/min/{1.73_m2} (ref >59)

## 2021-07-18 LAB — CBC WITH DIFFERENTIAL/PLATELET
Basophils Absolute: 0.1 10*3/uL (ref 0.0–0.2)
Basos: 1 %
EOS (ABSOLUTE): 0.1 10*3/uL (ref 0.0–0.4)
Eos: 2 %
Hematocrit: 41.5 % (ref 34.0–46.6)
Hemoglobin: 13.7 g/dL (ref 11.1–15.9)
Immature Grans (Abs): 0 10*3/uL (ref 0.0–0.1)
Immature Granulocytes: 0 %
Lymphocytes Absolute: 1.6 10*3/uL (ref 0.7–3.1)
Lymphs: 26 %
MCH: 27.3 pg (ref 26.6–33.0)
MCHC: 33 g/dL (ref 31.5–35.7)
MCV: 83 fL (ref 79–97)
Monocytes Absolute: 0.5 10*3/uL (ref 0.1–0.9)
Monocytes: 8 %
Neutrophils Absolute: 3.7 10*3/uL (ref 1.4–7.0)
Neutrophils: 63 %
Platelets: 447 10*3/uL (ref 150–450)
RBC: 5.02 x10E6/uL (ref 3.77–5.28)
RDW: 12.8 % (ref 11.7–15.4)
WBC: 5.9 10*3/uL (ref 3.4–10.8)

## 2021-07-18 LAB — LIPID PANEL
Chol/HDL Ratio: 2.5 ratio (ref 0.0–4.4)
Cholesterol, Total: 220 mg/dL — ABNORMAL HIGH (ref 100–199)
HDL: 89 mg/dL (ref >39)
LDL Chol Calc (NIH): 114 mg/dL — ABNORMAL HIGH (ref 0–99)
Triglycerides: 101 mg/dL (ref 0–149)
VLDL Cholesterol Cal: 17 mg/dL (ref 5–40)

## 2021-07-18 LAB — IRON,TIBC AND FERRITIN PANEL
Ferritin: 20 ng/mL (ref 15–150)
Iron Saturation: 27 % (ref 15–55)
Iron: 124 ug/dL (ref 27–159)
Total Iron Binding Capacity: 455 ug/dL — ABNORMAL HIGH (ref 250–450)
UIBC: 331 ug/dL (ref 131–425)

## 2021-07-18 LAB — HEMOGLOBIN A1C
Est. average glucose Bld gHb Est-mCnc: 120 mg/dL
Hgb A1c MFr Bld: 5.8 % — ABNORMAL HIGH (ref 4.8–5.6)

## 2021-07-18 MED ORDER — QUETIAPINE FUMARATE ER 150 MG PO TB24
150.0000 mg | ORAL_TABLET | Freq: Every day | ORAL | 0 refills | Status: DC
  Filled 2021-07-18: qty 30, 30d supply, fill #0

## 2021-07-18 MED ORDER — SPIRONOLACTONE 100 MG PO TABS: ORAL_TABLET | ORAL | 0 refills | Status: DC

## 2021-07-18 MED ORDER — ONDANSETRON HCL 4 MG PO TABS
4.0000 mg | ORAL_TABLET | ORAL | 0 refills | Status: DC | PRN
  Filled 2021-07-18 – 2022-05-30 (×2): qty 8, 2d supply, fill #0

## 2021-07-18 MED ORDER — NORGESTIMATE-ETH ESTRADIOL 0.25-35 MG-MCG PO TABS: 1.0000 | ORAL_TABLET | Freq: Every day | ORAL | 6 refills | Status: DC

## 2021-07-18 NOTE — Telephone Encounter (Signed)
Med request

## 2021-07-18 NOTE — Addendum Note (Signed)
Addended by: Shan Levans E on: 07/18/2021 03:10 PM   Modules accepted: Orders

## 2021-07-18 NOTE — Telephone Encounter (Signed)
She is on aldactone d/t PCOS she MUST keep appt next week to discuss  [place] BCP ordered again

## 2021-07-19 ENCOUNTER — Other Ambulatory Visit: Payer: Self-pay

## 2021-07-19 MED ORDER — SPIRONOLACTONE 100 MG PO TABS
ORAL_TABLET | ORAL | 0 refills | Status: DC
  Filled 2021-07-19: qty 60, 30d supply, fill #0

## 2021-07-24 ENCOUNTER — Encounter (HOSPITAL_COMMUNITY): Payer: Self-pay | Admitting: Psychiatry

## 2021-07-24 ENCOUNTER — Other Ambulatory Visit: Payer: Self-pay

## 2021-07-24 ENCOUNTER — Encounter: Payer: Self-pay | Admitting: Critical Care Medicine

## 2021-07-24 ENCOUNTER — Ambulatory Visit: Payer: Medicaid Other | Attending: Critical Care Medicine | Admitting: Critical Care Medicine

## 2021-07-24 ENCOUNTER — Telehealth (INDEPENDENT_AMBULATORY_CARE_PROVIDER_SITE_OTHER): Payer: No Payment, Other | Admitting: Psychiatry

## 2021-07-24 VITALS — BP 107/78 | HR 93 | Temp 99.0°F | Resp 18 | Wt 196.0 lb

## 2021-07-24 DIAGNOSIS — L680 Hirsutism: Secondary | ICD-10-CM

## 2021-07-24 DIAGNOSIS — F250 Schizoaffective disorder, bipolar type: Secondary | ICD-10-CM

## 2021-07-24 DIAGNOSIS — F4310 Post-traumatic stress disorder, unspecified: Secondary | ICD-10-CM | POA: Diagnosis not present

## 2021-07-24 DIAGNOSIS — M25562 Pain in left knee: Secondary | ICD-10-CM | POA: Insufficient documentation

## 2021-07-24 DIAGNOSIS — N920 Excessive and frequent menstruation with regular cycle: Secondary | ICD-10-CM | POA: Insufficient documentation

## 2021-07-24 DIAGNOSIS — G4733 Obstructive sleep apnea (adult) (pediatric): Secondary | ICD-10-CM

## 2021-07-24 DIAGNOSIS — Z79624 Long term (current) use of inhibitors of nucleotide synthesis: Secondary | ICD-10-CM | POA: Insufficient documentation

## 2021-07-24 DIAGNOSIS — E282 Polycystic ovarian syndrome: Secondary | ICD-10-CM

## 2021-07-24 DIAGNOSIS — K8020 Calculus of gallbladder without cholecystitis without obstruction: Secondary | ICD-10-CM

## 2021-07-24 DIAGNOSIS — M25561 Pain in right knee: Secondary | ICD-10-CM | POA: Insufficient documentation

## 2021-07-24 DIAGNOSIS — F331 Major depressive disorder, recurrent, moderate: Secondary | ICD-10-CM

## 2021-07-24 DIAGNOSIS — F411 Generalized anxiety disorder: Secondary | ICD-10-CM

## 2021-07-24 DIAGNOSIS — Z79899 Other long term (current) drug therapy: Secondary | ICD-10-CM | POA: Insufficient documentation

## 2021-07-24 DIAGNOSIS — E782 Mixed hyperlipidemia: Secondary | ICD-10-CM

## 2021-07-24 DIAGNOSIS — K219 Gastro-esophageal reflux disease without esophagitis: Secondary | ICD-10-CM

## 2021-07-24 DIAGNOSIS — Z7984 Long term (current) use of oral hypoglycemic drugs: Secondary | ICD-10-CM | POA: Insufficient documentation

## 2021-07-24 DIAGNOSIS — Z818 Family history of other mental and behavioral disorders: Secondary | ICD-10-CM | POA: Insufficient documentation

## 2021-07-24 DIAGNOSIS — G8929 Other chronic pain: Secondary | ICD-10-CM

## 2021-07-24 DIAGNOSIS — Z8673 Personal history of transient ischemic attack (TIA), and cerebral infarction without residual deficits: Secondary | ICD-10-CM

## 2021-07-24 DIAGNOSIS — R7303 Prediabetes: Secondary | ICD-10-CM

## 2021-07-24 DIAGNOSIS — Z760 Encounter for issue of repeat prescription: Secondary | ICD-10-CM | POA: Insufficient documentation

## 2021-07-24 DIAGNOSIS — A609 Anogenital herpesviral infection, unspecified: Secondary | ICD-10-CM

## 2021-07-24 MED ORDER — DICLOFENAC SODIUM 1 % EX GEL
2.0000 g | Freq: Four times a day (QID) | CUTANEOUS | 2 refills | Status: DC
  Filled 2021-07-24: qty 100, 12d supply, fill #0
  Filled 2021-08-02: qty 100, 30d supply, fill #0

## 2021-07-24 MED ORDER — ATORVASTATIN CALCIUM 10 MG PO TABS
10.0000 mg | ORAL_TABLET | Freq: Every day | ORAL | 2 refills | Status: DC
  Filled 2021-07-24: qty 30, 30d supply, fill #0

## 2021-07-24 MED ORDER — SPIRONOLACTONE 100 MG PO TABS
ORAL_TABLET | ORAL | 2 refills | Status: DC
Start: 2021-07-24 — End: 2021-08-02
  Filled 2021-08-02: qty 60, 30d supply, fill #0

## 2021-07-24 MED ORDER — LAMOTRIGINE 100 MG PO TABS
100.0000 mg | ORAL_TABLET | Freq: Every day | ORAL | 3 refills | Status: DC
  Filled 2021-07-24: qty 30, 30d supply, fill #0

## 2021-07-24 MED ORDER — VANIQA 13.9 % EX CREA
TOPICAL_CREAM | CUTANEOUS | 2 refills | Status: DC
  Filled 2021-07-24: qty 45, 30d supply, fill #0

## 2021-07-24 MED ORDER — PANTOPRAZOLE SODIUM 20 MG PO TBEC
20.0000 mg | DELAYED_RELEASE_TABLET | Freq: Every day | ORAL | 1 refills | Status: DC
  Filled 2021-07-24 – 2022-05-30 (×2): qty 30, 30d supply, fill #0

## 2021-07-24 MED ORDER — SERTRALINE HCL 50 MG PO TABS
50.0000 mg | ORAL_TABLET | Freq: Every day | ORAL | 3 refills | Status: DC
  Filled 2021-07-24: qty 30, 30d supply, fill #0

## 2021-07-24 MED ORDER — QUETIAPINE FUMARATE ER 200 MG PO TB24
200.0000 mg | ORAL_TABLET | Freq: Every day | ORAL | 3 refills | Status: DC
  Filled 2021-07-24 – 2021-08-02 (×2): qty 30, 30d supply, fill #0
  Filled 2021-10-09 – 2021-10-25 (×2): qty 30, 30d supply, fill #1

## 2021-07-24 MED ORDER — QUETIAPINE FUMARATE 50 MG PO TABS
50.0000 mg | ORAL_TABLET | Freq: Every day | ORAL | 3 refills | Status: DC
  Filled 2021-07-24 – 2021-10-09 (×3): qty 30, 30d supply, fill #0

## 2021-07-24 MED ORDER — PRAZOSIN HCL 2 MG PO CAPS
2.0000 mg | ORAL_CAPSULE | Freq: Every day | ORAL | 2 refills | Status: DC
  Filled 2021-07-24 – 2021-08-02 (×2): qty 30, 30d supply, fill #0

## 2021-07-24 MED ORDER — NORGESTIMATE-ETH ESTRADIOL 0.25-35 MG-MCG PO TABS
1.0000 | ORAL_TABLET | Freq: Every day | ORAL | 11 refills | Status: DC
  Filled 2021-07-24 (×2): qty 28, 28d supply, fill #0

## 2021-07-24 NOTE — Assessment & Plan Note (Signed)
Sleep study pending 

## 2021-07-24 NOTE — Assessment & Plan Note (Signed)
Stable on current mental health medications refills available to the patient managed by mental health

## 2021-07-24 NOTE — Patient Instructions (Signed)
Do the knee exercises as ordered and begin the Voltaren gel to the knees 4 times daily for knee pain and a referral to orthopedic surgery was made  Birth control medications sent to the pharmacy downstairs  Refills on all your medications available at the pharmacy downstairs  All of your labs looked excellent from last week  Return to Dr. Delford Field 4 months

## 2021-07-24 NOTE — Assessment & Plan Note (Signed)
As per bipolar assessment 

## 2021-07-24 NOTE — Assessment & Plan Note (Signed)
We will monitor for now. 

## 2021-07-24 NOTE — Assessment & Plan Note (Signed)
History of ischemic stroke stable at this time continue lipid therapy

## 2021-07-24 NOTE — Progress Notes (Signed)
Birth control needs to change b/c get 2 menses a month  Refill on birth control Pain on lower legs when walking Pain is 10; constant pain

## 2021-07-24 NOTE — Assessment & Plan Note (Signed)
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

## 2021-07-24 NOTE — Progress Notes (Signed)
Established Patient Office Visit  Subjective:  Patient ID: Sarah Evans, female    DOB: [birth year]  Age: 41 y.o. MRN: [id number]    CC:  Primary care follow-up  HPI 10/2020 Sarah Evans presents for assistance with filling out mental and physical residual functional capacity assessments.  The patient had a significant stroke earlier this year leaving the patient with left upper extremity weakness and pain.  She also has chronic midthoracic and neck pain as well.  Patient has hyperlipidemia with type 2 diabetes and severe bipolar disorder and severe depression.  She has had psychotic features but not full-blown schizophrenia.  Despite treatment with mental health her depression and bipolar disorder have been significantly impacting her abilities.    The patient has not been able to perform her job and has been out of work for some time.  She sits at home is not able to engage in any social interactions.  Note she is not suicidal.  She was not able to focus on her job and the back pain and left upper extremity weakness and pain have created a situation where she drops objects frequently.  She also has left leg weakness cannot walk.  She has chronic headaches dizziness and feels faint.  The patient also has visual acuity issues in the left eye after the stroke  On arrival blood pressure is good 118/80.  Her diabetes has been reasonably controlled on metformin alone.  She is on statin therapy along with medication for blood pressure.  Minimal residual functional assessment is done as outlined below and per the forms functional residual capacity also is done per forms below.  6/15   This patient is seen today by way of a video visit.  She is in her bedroom at home.  She is in no distress on the video.  Her chief complaint is that of increased weight gain and considering potential Ozempic.  Unfortunately she only has family-planning Medicaid.  She works as a Scientist, water quality at [employer].  She works  about 20 hours a week.  She only walks about 3000 steps a day.  She eats a lot of vegetables in her diet.  Her weight has increased and her BMI is 30.  Patient does have prediabetes.  A1c has been elevated in the past needs to be rechecked.  Patient's had previous stroke and is on statin therapy.  Was in the ER in May for abdominal pain found to have cholelithiasis.  She was to follow-up with general surgery for potential resection but this is yet to occur.  Patient also has bipolar disorder and needs refills on her Seroquel.  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  There are no other complaints. See Epworth scale total score is 15 the patient was given Norco in the emergency room in May she still has a bottle of medicine and takes it rarely Patient had a Pap smear in May was normal  7/19 This patient is seen in return follow-up and notes that her mental health is improved on her current program and she is followed closely at the behavioral [facility].  She now does have the [facility] discount available.  Also has the orange card.  On arrival blood pressure 107/70.  She still having heavy menstrual periods and would like a prescription for birth control medications.  We have sent that to our pharmacy downstairs.  Patient  also saw gynecology earlier this year and had a normal Pap smear.  She is also on Aldactone for PCOS.  Patient still has hirsutism despite the Aldactone.  Patient is interested in topical therapy for hirsutism.  Patient complains of bilateral knee pain at this time.  She has difficulty with walking.  There are no stroke symptoms at this time.  She has lost some weight.  She is taking her statin therapy.  Labs recently done all were improved.  Past Medical History:  Diagnosis Date   Bipolar 1 disorder ([place])    Herpes simplex virus (HSV) infection    dx 2001   Polycystic ovary disease    PTSD  (post-traumatic stress disorder)    Schizophrenia ([place])    Stroke [facility])     Past Surgical History:  Procedure Laterality Date   BUBBLE STUDY  05/25/2020   Procedure: BUBBLE STUDY;  Surgeon: Sarah Casino, MD;  Location: [place];  Service: Cardiovascular;;   NO PAST SURGERIES     TEE WITHOUT CARDIOVERSION N/A 05/25/2020   Procedure: TRANSESOPHAGEAL ECHOCARDIOGRAM (TEE);  Surgeon: Sarah Casino, MD;  Location: [facility];  Service: Cardiovascular;  Laterality: N/A;    Family History  Problem Relation Age of Onset   Bipolar disorder Mother    Diabetes Maternal Grandmother    Hypertension Neg Hx    Heart disease Neg Hx    Cancer Neg Hx     Social History   Socioeconomic History   Marital status: Single    Spouse name: Not on file   Number of children: Not on file   Years of education: Not on file   Highest education level: Not on file  Occupational History   Not on file  Tobacco Use   Smoking status: Former    Packs/day: 0.25    Years: 15.00    Total pack years: 3.75    Types: Cigarettes    Quit date: 05/02/2020    Years since quitting: 1.2   Smokeless tobacco: Never  Vaping Use   Vaping Use: Never used  Substance and Sexual Activity   Alcohol use: No   Drug use: No   Sexual activity: Not Currently    Birth control/protection: Abstinence  Other Topics Concern   Not on file  Social History Narrative   Not on file   Social Determinants of Health   Financial Resource Strain: Not on file  Food Insecurity: Not on file  Transportation Needs: Not on file  Physical Activity: Not on file  Stress: Not on file  Social Connections: Not on file  Intimate Partner Violence: Not on file    Outpatient Medications Prior to Visit  Medication Sig Dispense Refill   clobetasol cream (TEMOVATE) 8.08 % Apply 1 application topically to the affected area(s) 2 (two) times daily. 30 g 0   lamoTRIgine (LAMICTAL) 100 MG tablet Take 1 tablet (100 mg total) by mouth daily.  30 tablet 3   metFORMIN (GLUCOPHAGE) 500 MG tablet Take 1 tablet (500 mg total) by mouth once daily with breakfast. 60 tablet 3   ondansetron (ZOFRAN) 4 MG tablet Take 1 tablet (4 mg total) by mouth every 4 (four) hours as needed for nausea or vomiting. 8 tablet 0   prazosin (MINIPRESS) 2 MG capsule Take 1 capsule (2 mg total) by mouth once nightly at bedtime. 30 capsule 2   QUEtiapine (SEROQUEL XR) 200 MG 24 hr tablet Take 1 tablet (200 mg total) by mouth at bedtime. 30 tablet 3  QUEtiapine (SEROQUEL) 50 MG tablet Take 1 tablet (50 mg total) by mouth once daily. 30 tablet 3   Semaglutide, 1 MG/DOSE, 4 MG/3ML SOPN Inject 1 mg into the skin once a week as directed. 3 mL 3   sertraline (ZOLOFT) 50 MG tablet Take 1 tablet (50 mg total) by mouth daily. 30 tablet 3   valACYclovir (VALTREX) 1000 MG tablet Take 1 tablet (1,000 mg total) by mouth once daily. 60 tablet 2   atorvastatin (LIPITOR) 10 MG tablet Take 1 tablet (10 mg total) by mouth daily. 30 tablet 2   norgestimate-ethinyl estradiol (MILI) 0.25-35 MG-MCG tablet Take 1 tablet by mouth once daily. 28 tablet 6   pantoprazole (PROTONIX) 20 MG tablet Take 1 tablet (20 mg total) by mouth once daily. 30 tablet 1   spironolactone (ALDACTONE) 100 MG tablet take 1 tablet by mouth twice daily 390 tablet 0   cholecalciferol (VITAMIN D3) 25 MCG (1000 UNIT) tablet Take 2,000 Units by mouth daily. (Patient not taking: Reported on 07/24/2021)     HYDROcodone-acetaminophen (NORCO/VICODIN) 5-325 MG tablet Take 1 tablet by mouth every 4 (four) hours as needed. (Patient not taking: Reported on 07/24/2021) 10 tablet 0   ipratropium (ATROVENT) 0.03 % nasal spray Place 2 sprays into both nostrils every 12 (twelve) hours. (Patient not taking: Reported on 07/24/2021) 30 mL 0   Multiple Vitamin (MULTIVITAMIN WITH MINERALS) TABS tablet Take 1 tablet by mouth daily. (Patient not taking: Reported on 07/24/2021)     No facility-administered medications prior to visit.    No  Known Allergies  ROS Review of Systems  Constitutional:  Negative for activity change and fatigue.  HENT: Negative.  Negative for ear pain, postnasal drip, rhinorrhea, sinus pressure, sore throat, trouble swallowing and voice change.   Eyes:  Negative for visual disturbance.  Respiratory: Negative.  Negative for apnea, cough, choking, chest tightness, shortness of breath, wheezing and stridor.   Cardiovascular: Negative.  Negative for chest pain, palpitations and leg swelling.  Gastrointestinal:  Negative for abdominal distention, abdominal pain, nausea and vomiting.  Genitourinary: Negative.   Musculoskeletal:  Negative for arthralgias, back pain, gait problem, myalgias, neck pain and neck stiffness.       Bilateral knee pain  Skin: Negative.  Negative for rash.  Allergic/Immunologic: Negative.  Negative for environmental allergies and food allergies.  Neurological:  Negative for dizziness, tremors, syncope, facial asymmetry, weakness, light-headedness, numbness and headaches.  Hematological: Negative.  Negative for adenopathy. Does not bruise/bleed easily.  Psychiatric/Behavioral:  Negative for agitation, behavioral problems, confusion, decreased concentration, dysphoric mood, hallucinations, self-injury, sleep disturbance and suicidal ideas. The patient is not nervous/anxious and is not hyperactive.       Objective:   Vitals:   07/24/21 0948  BP: 107/78  Pulse: 93  Resp: 18  Temp: 99 F (37.2 C)  TempSrc: Oral  SpO2: 96%  Weight: 196 lb (88.9 kg)    Gen: Pleasant, well-nourished, in no distress,  normal affect  ENT: No lesions,  mouth clear,  oropharynx clear, no postnasal drip  Neck: No JVD, no TMG, no carotid bruits  Lungs: No use of accessory muscles, no dullness to percussion, clear without rales or rhonchi  Cardiovascular: RRR, heart sounds normal, no murmur or gallops, no peripheral edema  Abdomen: soft and NT, no HSM,  BS normal  Musculoskeletal: No  deformities, no cyanosis or clubbing, tenderness in both knees but full range of motion tenderness is medial there is no evidence of patellar chondromalacia on exam  Neuro: alert, non focal  Skin: Warm, no lesions or rashes, mild hirsutism on face  BP 107/78 (BP Location: Left Arm, Patient Position: Sitting, Cuff Size: Normal)   Pulse 93   Temp 99 F (37.2 C) (Oral)   Resp 18   Wt 196 lb (88.9 kg)   LMP 07/06/2021   SpO2 96%   BMI 30.70 kg/m  Wt Readings from Last 3 Encounters:  07/24/21 196 lb (88.9 kg)  05/30/21 190 lb 12.8 oz (86.5 kg)  05/22/21 194 lb (88 kg)     Health Maintenance Due  Topic Date Due   COVID-19 Vaccine (1) Never done   OPHTHALMOLOGY EXAM  Never done    There are no preventive care reminders to display for this patient.  Lab Results  Component Value Date   TSH 2.110 04/02/2020   Lab Results  Component Value Date   WBC 5.9 07/17/2021   HGB 13.7 07/17/2021   HCT 41.5 07/17/2021   MCV 83 07/17/2021   PLT 447 07/17/2021   Lab Results  Component Value Date   NA 137 07/17/2021   K 5.0 07/17/2021   CO2 21 07/17/2021   GLUCOSE 90 07/17/2021   BUN 16 07/17/2021   CREATININE 1.14 (H) 07/17/2021   BILITOT 0.6 07/17/2021   ALKPHOS 105 07/17/2021   AST 18 07/17/2021   ALT 22 07/17/2021   PROT 7.5 07/17/2021   ALBUMIN 4.3 07/17/2021   CALCIUM 9.4 07/17/2021   ANIONGAP 5 05/22/2021   EGFR 63 07/17/2021   Lab Results  Component Value Date   CHOL 220 (H) 07/17/2021   Lab Results  Component Value Date   HDL 89 07/17/2021   Lab Results  Component Value Date   LDLCALC 114 (H) 07/17/2021   Lab Results  Component Value Date   TRIG 101 07/17/2021   Lab Results  Component Value Date   CHOLHDL 2.5 07/17/2021         Assessment & Plan:  I personally reviewed all images and lab data in the [facility] system as well as any outside material available during this office visit and agree with the  radiology impressions.   History of  stroke History of ischemic stroke stable at this time continue lipid therapy  Sleep apnea Sleep study pending  Cholelithiasis We will monitor for now  Hirsutism Topical therapy prescribed  Schizoaffective disorder, bipolar type ([place]) Stable on current mental health medications refills available to the patient managed by mental health  PTSD (post-traumatic stress disorder) As per bipolar assessment  Prediabetes Now on semaglutide injections weekly and metformin for prediabetes and PCOS  Moderate episode of recurrent major depressive disorder ([place]) As per mental health  Mixed hyperlipidemia Continue statins  Generalized anxiety disorder As per mental health  HSV (herpes simplex virus) anogenital infection Continue with Valtrex suppressive therapy  Chronic pain of both knees Chronic pain in both knees likely osteoarthritis in the medial compartments of both knees plan is to give knee exercises and topical Voltaren gel and referral to orthopedics note she has the orange card   Avriel was seen today for pain and medication refill.  Diagnoses and all orders for this visit:  Chronic pain of both knees -     Ambulatory referral to Orthopedic Surgery  PCOS (polycystic ovarian syndrome) -     norgestimate-ethinyl estradiol (MILI) 0.25-35 MG-MCG tablet; Take 1 tablet by mouth once daily.  Gastroesophageal reflux disease without esophagitis -     pantoprazole (PROTONIX) 20 MG tablet; Take 1  tablet (20 mg total) by mouth once daily.  History of stroke  Obstructive sleep apnea syndrome  Calculus of gallbladder without cholecystitis without obstruction  Hirsutism  Schizoaffective disorder, bipolar type (H[facility])  PTSD (post-traumatic stress disorder)  Prediabetes  Moderate episode of recurrent major depressive disorder ([facility])  Mixed hyperlipidemia  Generalized anxiety disorder  HSV (herpes simplex virus) anogenital infection  Other orders -     Eflornithine HCl  (VANIQA) 13.9 % cream; Apply to face twice daily as needed -     atorvastatin (LIPITOR) 10 MG tablet; Take 1 tablet (10 mg total) by mouth daily. -     spironolactone (ALDACTONE) 100 MG tablet; take 1 tablet by mouth twice daily -     diclofenac Sodium (VOLTAREN) 1 % GEL; Apply 2 g topically 4 (four) times daily.

## 2021-07-24 NOTE — Assessment & Plan Note (Signed)
As per mental health 

## 2021-07-24 NOTE — Assessment & Plan Note (Signed)
Continue with Valtrex suppressive therapy

## 2021-07-24 NOTE — Assessment & Plan Note (Signed)
-  Continue statins ?

## 2021-07-24 NOTE — Progress Notes (Signed)
BH MD/PA/NP OP Progress Note Virtual Visit via Telephone Note  I connected with Sarah Evans on 07/24/21 at  8:30 AM EDT by telephone and verified that I am speaking with the correct person using two identifiers.  Location: Patient: home Provider: Clinic   I discussed the limitations, risks, security and privacy concerns of performing an evaluation and management service by telephone and the availability of in person appointments. I also discussed with the patient that there may be a patient responsible charge related to this service. The patient expressed understanding and agreed to proceed.   I provided 30 minutes of non-face-to-face time during this encounter.           07/24/2021 8:58 AM Sarah Evans  MRN:  [id number]  Chief Complaint: "I have been doing really good"   HPI: 41 year old female seen today for follow up psychiatric evaluation. She has psychiatric history of schizoaffective disorder, bipolar 1, depression, anxiety, and PTSD. She is currently managed on Zoloft 50 mg daily, Prazosin 2 mg nightly, Lamictal 75 mg daily, Seroquel 150 mg nightly, and Seroquel 50 mg daily. She notes her medications are somewhat effective in managing her symptoms.   Today she was unable to login virtually so her assessment was done over the phone.  During exam she was pleasant, cooperative, and engaged in conversation.  She informed Clinical research associate that she has been doing really good.  She notes that she recently got a new job at [employer] and has gotten 2 new pet birds.  Patient informed Clinical research associate that she restarted some of her medications after discontinuing them earlier in the year.  She notes that she feels mentally stable but at times has depressive episodes and insomnia.  Patient informed Clinical research associate that she has moments where she does not sleep at all and other moments where she sleeps for 8 or 9 hours.  Today she denies SI/HI/VAH, mania, paranoia.    Patient informed writer that her appetite has  been reduced.  She notes that recently she was started on Ozempic by her PCP to help manage her diabetes as well as her weight.  She reports that she finds the medications effective.    Patient informed writer that she has leg pain which she quantifies as 10 out of 10.  She notes that she takes Tylenol occasionally to help manage this pain.  She is also followed regularly by PCP  Today patient is agreeable to increasing Seroquel 150 mg to 200 mg to help manage sleep.  She will also increase Lamictal to 75 mg to 100 mg to help manage mood and depressive episodes.  She will continue all other medications as prescribed.  No other concerns at this time.       Visit Diagnosis:    ICD-10-CM   1. Schizoaffective disorder, bipolar type (HCC)  F25.0 lamoTRIgine (LAMICTAL) 100 MG tablet    QUEtiapine (SEROQUEL) 50 MG tablet    QUEtiapine (SEROQUEL XR) 200 MG 24 hr tablet    sertraline (ZOLOFT) 50 MG tablet    2. PTSD (post-traumatic stress disorder)  F43.10 prazosin (MINIPRESS) 2 MG capsule    sertraline (ZOLOFT) 50 MG tablet      Past Psychiatric History: Schizoaffective disorder, Bipolar 1, Depression, Anxiety, and PTSD  Past Medical History:  Past Medical History:  Diagnosis Date   Bipolar 1 disorder (HCC)    Herpes simplex virus (HSV) infection    dx 2001   Polycystic ovary disease    PTSD (post-traumatic stress disorder)  Schizophrenia (HCC)    Stroke [facility])     Past Surgical History:  Procedure Laterality Date   BUBBLE STUDY  05/25/2020   Procedure: BUBBLE STUDY;  Surgeon: Chrystie Nose, MD;  Location: [facility] ENDOSCOPY;  Service: Cardiovascular;;   NO PAST SURGERIES     TEE WITHOUT CARDIOVERSION N/A 05/25/2020   Procedure: TRANSESOPHAGEAL ECHOCARDIOGRAM (TEE);  Surgeon: Chrystie Nose, MD;  Location: [facility] ENDOSCOPY;  Service: Cardiovascular;  Laterality: N/A;    Family Psychiatric History: Bipolar, Schizophrenia, and Depressed. Father Bipolar disorder.   Family History:   Family History  Problem Relation Age of Onset   Bipolar disorder Mother    Diabetes Maternal Grandmother    Hypertension Neg Hx    Heart disease Neg Hx    Cancer Neg Hx     Social History:  Social History   Socioeconomic History   Marital status: Single    Spouse name: Not on file   Number of children: Not on file   Years of education: Not on file   Highest education level: Not on file  Occupational History   Not on file  Tobacco Use   Smoking status: Former    Packs/day: 0.25    Years: 15.00    Total pack years: 3.75    Types: Cigarettes    Quit date: 05/02/2020    Years since quitting: 1.2   Smokeless tobacco: Never  Vaping Use   Vaping Use: Never used  Substance and Sexual Activity   Alcohol use: No   Drug use: No   Sexual activity: Not Currently    Birth control/protection: Abstinence  Other Topics Concern   Not on file  Social History Narrative   Not on file   Social Determinants of Health   Financial Resource Strain: Not on file  Food Insecurity: Not on file  Transportation Needs: Not on file  Physical Activity: Not on file  Stress: Not on file  Social Connections: Not on file    Allergies: No Known Allergies  Metabolic Disorder Labs: Lab Results  Component Value Date   HGBA1C 5.8 (H) 07/17/2021   MPG 117 06/15/2020   MPG 128.37 05/24/2020   Lab Results  Component Value Date   PROLACTIN 13.0 04/28/2015   Lab Results  Component Value Date   CHOL 220 (H) 07/17/2021   TRIG 101 07/17/2021   HDL 89 07/17/2021   CHOLHDL 2.5 07/17/2021   VLDL 28 05/24/2020   LDLCALC 114 (H) 07/17/2021   LDLCALC 63 05/24/2020   Lab Results  Component Value Date   TSH 2.110 04/02/2020   TSH 2.040 10/26/2018    Therapeutic Level Labs: No results found for: "LITHIUM" No results found for: "VALPROATE" No results found for: "CBMZ"  Current Medications: Current Outpatient Medications  Medication Sig Dispense Refill   atorvastatin (LIPITOR) 10 MG tablet  Take 1 tablet (10 mg total) by mouth daily. 30 tablet 2   cholecalciferol (VITAMIN D3) 25 MCG (1000 UNIT) tablet Take 2,000 Units by mouth daily.     clobetasol cream (TEMOVATE) 0.05 % Apply 1 application topically to the affected area(s) 2 (two) times daily. 30 g 0   HYDROcodone-acetaminophen (NORCO/VICODIN) 5-325 MG tablet Take 1 tablet by mouth every 4 (four) hours as needed. 10 tablet 0   ipratropium (ATROVENT) 0.03 % nasal spray Place 2 sprays into both nostrils every 12 (twelve) hours. 30 mL 0   lamoTRIgine (LAMICTAL) 100 MG tablet Take 1 tablet (100 mg total) by mouth daily. 30 tablet 3  metFORMIN (GLUCOPHAGE) 500 MG tablet Take 1 tablet (500 mg total) by mouth once daily with breakfast. 60 tablet 3   Multiple Vitamin (MULTIVITAMIN WITH MINERALS) TABS tablet Take 1 tablet by mouth daily.     norgestimate-ethinyl estradiol (MILI) 0.25-35 MG-MCG tablet Take 1 tablet by mouth once daily. 28 tablet 6   ondansetron (ZOFRAN) 4 MG tablet Take 1 tablet (4 mg total) by mouth every 4 (four) hours as needed for nausea or vomiting. 8 tablet 0   pantoprazole (PROTONIX) 20 MG tablet Take 1 tablet (20 mg total) by mouth once daily. 30 tablet 1   prazosin (MINIPRESS) 2 MG capsule Take 1 capsule (2 mg total) by mouth once nightly at bedtime. 30 capsule 2   QUEtiapine (SEROQUEL) 50 MG tablet Take 1 tablet (50 mg total) by mouth once daily. 30 tablet 3   QUEtiapine (SEROQUEL XR) 200 MG 24 hr tablet Take 1 tablet (200 mg total) by mouth at bedtime. 30 tablet 3   Semaglutide, 1 MG/DOSE, 4 MG/3ML SOPN Inject 1 mg into the skin once a week as directed. 3 mL 3   sertraline (ZOLOFT) 50 MG tablet Take 1 tablet (50 mg total) by mouth daily. 30 tablet 3   spironolactone (ALDACTONE) 100 MG tablet take 1 tablet by mouth twice daily 390 tablet 0   valACYclovir (VALTREX) 1000 MG tablet Take 1 tablet (1,000 mg total) by mouth once daily. 60 tablet 2   No current facility-administered medications for this visit.      Musculoskeletal: Strength & Muscle Tone:  Unable to assess due to telephone visit Gait & Station:  Unable to assess due to telephone visit Patient leans: N/A  Psychiatric Specialty Exam: Review of Systems  There were no vitals taken for this visit.There is no height or weight on file to calculate BMI.  General Appearance:  Unable to assess due to telephone visit  Eye Contact:   Unable to assess due to telephone visit  Speech:  Clear and Coherent and Normal Rate  Volume:  Normal  Mood:  Euthymic and reports depressive episodes  Affect:  Congruent  Thought Process:  Coherent, Goal Directed and Linear  Orientation:  Full (Time, Place, and Person)  Thought Content: WDL and Logical   Suicidal Thoughts:  No  Homicidal Thoughts:  No  Memory:  Immediate;   Good Recent;   Good Remote;   Good  Judgement:  Good  Insight:  Good  Psychomotor Activity:  Normal  Concentration:  Concentration: Good and Attention Span: Good  Recall:  Good  Fund of Knowledge: Good  Language: Good  Akathisia:   Unable to assess due to telephone visit  Handed:  Right  AIMS (if indicated): Not done  Assets:  Communication Skills Desire for Improvement Housing Leisure Time Social Support  ADL's:  Intact  Cognition: WNL  Sleep:  Good   Screenings: AUDIT    Flowsheet Row [facility]  Alcohol Use Disorder Identification Test Final Score (AUDIT) 0      GAD-7    Flowsheet Row Video Visit from 07/24/2021 in [facility] Video Visit from 01/21/2021 in [facility] Office Visit from 11/01/2020 in [facility] Video Visit from 10/16/2020 in [facility] Video Visit from 09/21/2020 in [facility]  Total GAD-7 Score 9 15 21 21 16       PHQ2-9    Flowsheet  Row Video Visit from 07/24/2021 in  [facility] Video Visit from 01/21/2021 in [facility] Counselor from 12/24/2020 in [facility] Office Visit from 11/01/2020 in [facility] Video Visit from 10/16/2020 in [facility]  PHQ-2 Total Score 2 6 6 2 6   PHQ-9 Total Score 11 20 24 20 27       Flowsheet Row ED from 05/22/2021 in [facility] ED from 04/11/2021 in [facility] Video Visit from 01/21/2021 in [facility]  C-SSRS RISK CATEGORY No Risk No Risk No Risk        Assessment and Plan: Patient endorses depressive episodes and insomnia. Today patient is agreeable to increasing Seroquel 150 mg to 200 mg to help manage sleep.  She will also increase Lamictal to 75 mg to 100 mg to help manage mood and depressive episodes.  She will continue all other medications as prescribed.  1. Schizoaffective disorder, bipolar type (HCC)  Increase- lamoTRIgine (LAMICTAL) 100 MG tablet; Take 1 tablet (100 mg total) by mouth daily.  Dispense: 30 tablet; Refill: 3 Continue- QUEtiapine (SEROQUEL) 50 MG tablet; Take 1 tablet (50 mg total) by mouth once daily.  Dispense: 30 tablet; Refill: 3 Increase- QUEtiapine (SEROQUEL XR) 200 MG 24 hr tablet; Take 1 tablet (200 mg total) by mouth at bedtime.  Dispense: 30 tablet; Refill: 3 Continue- sertraline (ZOLOFT) 50 MG tablet; Take 1 tablet (50 mg total) by mouth daily.  Dispense: 30 tablet; Refill: 3  2. PTSD (post-traumatic stress disorder)  Continue- prazosin (MINIPRESS) 2 MG capsule; Take 1 capsule (2 mg total) by mouth once nightly at bedtime.  Dispense: 30 capsule; Refill: 2 Continue- sertraline (ZOLOFT) 50 MG tablet; Take 1 tablet (50 mg total) by mouth daily.  Dispense: 30 tablet; Refill: 3     Follow up in 3 months Follow-up with therapy   01/23/2021,  NP 07/24/2021, 8:58 AM

## 2021-07-24 NOTE — Assessment & Plan Note (Signed)
Now on semaglutide injections weekly and metformin for prediabetes and PCOS

## 2021-07-24 NOTE — Assessment & Plan Note (Signed)
Topical therapy prescribed

## 2021-07-25 ENCOUNTER — Other Ambulatory Visit: Payer: Self-pay

## 2021-07-25 ENCOUNTER — Encounter: Payer: Self-pay | Admitting: Critical Care Medicine

## 2021-07-29 MED ORDER — CEFDINIR 300 MG PO CAPS
300.0000 mg | ORAL_CAPSULE | Freq: Two times a day (BID) | ORAL | 0 refills | Status: AC
  Filled 2021-07-29: qty 10, 5d supply, fill #0

## 2021-07-30 ENCOUNTER — Other Ambulatory Visit: Payer: Self-pay

## 2021-08-02 ENCOUNTER — Other Ambulatory Visit: Payer: Self-pay

## 2021-08-02 ENCOUNTER — Other Ambulatory Visit: Payer: Self-pay | Admitting: Critical Care Medicine

## 2021-08-02 NOTE — Telephone Encounter (Signed)
Medication Refill - Medication: spironolactone (ALDACTONE) 100 MG tablet  Pt is requesting medication transfer to a new pharmacy as they will be mailin it to her. The pharmacy is requesting a new Rx be sent; the previous pharmacy declined to transfer medication.   Has the patient contacted their pharmacy? Yes.    (Agent: If yes, when and what did the pharmacy advise?)  Preferred Pharmacy (with phone number or street name):  [facility] [place] - [address]  [address], [place] [address] [place] [address]  Phone: [phone] Fax: [phone]  Hours: Not open 24 hours   Has the patient been seen for an appointment in the last year OR does the patient have an upcoming appointment? Yes.    Agent: Please be advised that RX refills may take up to 3 business days. We ask that you follow-up with your pharmacy.

## 2021-08-05 MED ORDER — SPIRONOLACTONE 100 MG PO TABS: ORAL_TABLET | ORAL | 2 refills | Status: DC

## 2021-08-05 NOTE — Telephone Encounter (Signed)
Refill sent to incorrect pharmacy on 07/24/2021. Requested Prescriptions  Pending Prescriptions Disp Refills  . spironolactone (ALDACTONE) 100 MG tablet 390 tablet 2    Sig: take 1 tablet by mouth twice daily     Cardiovascular: Diuretics - Aldosterone Antagonist Failed - 08/02/2021 12:18 PM      Failed - Cr in normal range and within 180 days    Creatinine, Ser  Date Value Ref Range Status  07/17/2021 1.14 (H) 0.57 - 1.00 mg/dL Final         Passed - K in normal range and within 180 days    Potassium  Date Value Ref Range Status  07/17/2021 5.0 3.5 - 5.2 mmol/L Final         Passed - Na in normal range and within 180 days    Sodium  Date Value Ref Range Status  07/17/2021 137 134 - 144 mmol/L Final         Passed - eGFR is 30 or above and within 180 days    GFR calc Af Amer  Date Value Ref Range Status  10/26/2018 80 >59 mL/min/1.73 Final   GFR, Estimated  Date Value Ref Range Status  05/22/2021 >60 >60 mL/min Final    Comment:    (NOTE) Calculated using the CKD-EPI Creatinine Equation (2021)    eGFR  Date Value Ref Range Status  07/17/2021 63 >59 mL/min/1.73 Final         Passed - Last BP in normal range    BP Readings from Last 1 Encounters:  07/24/21 107/78         Passed - Valid encounter within last 6 months    Recent Outpatient Visits          1 week ago Chronic pain of both knees   [place], MD   1 month ago Prediabetes   [place], MD   3 months ago Obesity (BMI 30-39.9)   [place], [place], NP   9 months ago History of ischemic stroke   [place], MD   9 months ago Chronic thoracic back pain, unspecified back pain laterality   [place], [place]      Future Appointments            In 3 months Joya Gaskins, Burnett Harry,  MD [place]

## 2021-08-06 ENCOUNTER — Other Ambulatory Visit: Payer: Self-pay

## 2021-08-07 ENCOUNTER — Ambulatory Visit (INDEPENDENT_AMBULATORY_CARE_PROVIDER_SITE_OTHER): Payer: Self-pay

## 2021-08-07 ENCOUNTER — Ambulatory Visit: Payer: Self-pay

## 2021-08-07 ENCOUNTER — Ambulatory Visit (INDEPENDENT_AMBULATORY_CARE_PROVIDER_SITE_OTHER): Payer: Self-pay | Admitting: Orthopaedic Surgery

## 2021-08-07 ENCOUNTER — Other Ambulatory Visit: Payer: Self-pay

## 2021-08-07 DIAGNOSIS — M25561 Pain in right knee: Secondary | ICD-10-CM

## 2021-08-07 DIAGNOSIS — G8929 Other chronic pain: Secondary | ICD-10-CM

## 2021-08-07 DIAGNOSIS — M25562 Pain in left knee: Secondary | ICD-10-CM

## 2021-08-07 MED ORDER — MELOXICAM 7.5 MG PO TABS
7.5000 mg | ORAL_TABLET | Freq: Two times a day (BID) | ORAL | 2 refills | Status: DC | PRN
  Filled 2021-08-07: qty 30, 15d supply, fill #0
  Filled 2022-05-30 (×2): qty 30, 15d supply, fill #1

## 2021-08-07 NOTE — Progress Notes (Signed)
Office Visit Note   Patient: Sarah Evans           Date of Birth: 09/28/1980           MRN: [id number] Visit Date: 08/07/2021              Requested by: Storm Frisk, MD [address] [place],  [place] [address] PCP: Storm Frisk, MD   Assessment & Plan: Visit Diagnoses:  1. Chronic pain of both knees     Plan: Impression is bilateral knee pain etiology likely osteoarthritis.  No degenerative joint disease at this time.  Nonsurgical treatments were explained in detail.  We will send her to outpatient PT as well as prescription for meloxicam.  Natural supplements were also discussed.  Questions encouraged and answered.  Follow-Up Instructions: No follow-ups on file.   Orders:  Orders Placed This Encounter  Procedures   XR KNEE 3 VIEW RIGHT   XR KNEE 3 VIEW LEFT   Ambulatory referral to Physical Therapy   Meds ordered this encounter  Medications   meloxicam (MOBIC) 7.5 MG tablet    Sig: Take 1 tablet (7.5 mg total) by mouth 2 (two) times daily as needed for pain.    Dispense:  30 tablet    Refill:  2      Procedures: No procedures performed   Clinical Data: No additional findings.   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  Denies any previous surgeries or injuries.  Feels some swelling around the knees.  The knee pain is diffuse.  Feels some popping and weakness.  Takes Aleve which does not help tremendously.  Denies any mechanical symptoms.  Review of Systems  Constitutional: Negative.   HENT: Negative.    Eyes: Negative.   Respiratory: Negative.    Cardiovascular: Negative.   Endocrine: Negative.   Musculoskeletal: Negative.   Neurological: Negative.   Hematological: Negative.   Psychiatric/Behavioral: Negative.    All other systems reviewed and are  negative.    Objective: Vital Signs: LMP 07/06/2021   Physical Exam Vitals and nursing note reviewed.  Constitutional:      Appearance: She is well-developed.  HENT:     Head: Atraumatic.     Nose: Nose normal.  Eyes:     Extraocular Movements: Extraocular movements intact.  Cardiovascular:     Pulses: Normal pulses.  Pulmonary:     Effort: Pulmonary effort is normal.  Abdominal:     Palpations: Abdomen is soft.  Musculoskeletal:     Cervical back: Neck supple.  Skin:    General: Skin is warm.     Capillary Refill: Capillary refill takes less than 2 seconds.  Neurological:     Mental Status: She is alert. Mental status is at baseline.  Psychiatric:        Behavior: Behavior normal.        Thought Content: Thought content normal.        Judgment: Judgment normal.     Ortho Exam Examination of bilateral knees shows no joint effusion.  No real joint line tenderness.  Full range of motion.  Collaterals and cruciates are stable.  Normal patellar tracking. Specialty Comments:  No specialty comments available.  Imaging: XR KNEE 3 VIEW LEFT  Result  Date: 08/07/2021 No acute or structural abnormalities  XR KNEE 3 VIEW RIGHT  Result Date: 08/07/2021 No acute or structural abnormalities    PMFS History: Patient Active Problem List   Diagnosis Date Noted   Chronic pain of both knees 07/24/2021   Sleep apnea 06/20/2021   Cholelithiasis 06/20/2021   History of PCOS 05/30/2021   Hirsutism 05/30/2021   Chronic neck pain 11/01/2020   Chronic thoracic spine pain 10/25/2020   GERD without esophagitis 05/27/2020   Numbness and tingling 05/24/2020   History of stroke 05/23/2020   BMI 29.0-29.9,adult 05/21/2020   HSV (herpes simplex virus) anogenital infection 05/21/2020   Prediabetes 04/12/2020   Intrinsic eczema 04/12/2020   Mixed hyperlipidemia 04/12/2020   Muscle spasticity 04/12/2020   Moderate episode of recurrent major depressive disorder (HCC) 06/09/2019    Generalized anxiety disorder 06/09/2019   Personal history of nonsuicidal self-harm 04/28/2018   PTSD (post-traumatic stress disorder) 04/30/2015   Schizoaffective disorder, bipolar type (HCC) 04/27/2015   Past Medical History:  Diagnosis Date   Bipolar 1 disorder (HCC)    Herpes simplex virus (HSV) infection    dx 2001   Polycystic ovary disease    PTSD (post-traumatic stress disorder)    Schizophrenia (HCC)    Stroke (HCC)     Family History  Problem Relation Age of Onset   Bipolar disorder Mother    Diabetes Maternal Grandmother    Hypertension Neg Hx    Heart disease Neg Hx    Cancer Neg Hx     Past Surgical History:  Procedure Laterality Date   BUBBLE STUDY  05/25/2020   Procedure: BUBBLE STUDY;  Surgeon: Chrystie Nose, MD;  Location: [facility] ENDOSCOPY;  Service: Cardiovascular;;   NO PAST SURGERIES     TEE WITHOUT CARDIOVERSION N/A 05/25/2020   Procedure: TRANSESOPHAGEAL ECHOCARDIOGRAM (TEE);  Surgeon: Chrystie Nose, MD;  Location: [facility] ENDOSCOPY;  Service: Cardiovascular;  Laterality: N/A;   Social History   Occupational History   Not on file  Tobacco Use   Smoking status: Former    Packs/day: 0.25    Years: 15.00    Total pack years: 3.75    Types: Cigarettes    Quit date: 05/02/2020    Years since quitting: 1.2   Smokeless tobacco: Never  Vaping Use   Vaping Use: Never used  Substance and Sexual Activity   Alcohol use: No   Drug use: No   Sexual activity: Not Currently    Birth control/protection: Abstinence

## 2021-08-12 ENCOUNTER — Other Ambulatory Visit: Payer: Self-pay

## 2021-08-13 ENCOUNTER — Other Ambulatory Visit: Payer: Self-pay

## 2021-09-05 ENCOUNTER — Other Ambulatory Visit: Payer: Self-pay

## 2021-09-11 ENCOUNTER — Ambulatory Visit: Payer: Medicaid Other | Admitting: Physical Therapy

## 2021-09-12 ENCOUNTER — Other Ambulatory Visit: Payer: Self-pay

## 2021-09-12 ENCOUNTER — Ambulatory Visit: Payer: Self-pay | Admitting: *Deleted

## 2021-09-12 MED ORDER — KETOCONAZOLE 2 % EX SHAM
1.0000 | MEDICATED_SHAMPOO | CUTANEOUS | 0 refills | Status: DC
  Filled 2021-09-12: qty 120, 84d supply, fill #0

## 2021-09-12 NOTE — Addendum Note (Signed)
Addended by: Shan Levans E on: 09/12/2021 04:20 PM   Modules accepted: Orders

## 2021-09-12 NOTE — Telephone Encounter (Signed)
Medicated shampoo sent to pharmacy for pt to try

## 2021-09-12 NOTE — Telephone Encounter (Signed)
  Chief Complaint: dermatitis Symptoms: itching peeling scalp Frequency: had for 10 years, flared up Pertinent Negatives: Patient denies fever Disposition: [] ED /[] Urgent Care (no appt availability in office) / [] Appointment(In office/virtual)/ []  [place] [facility]/ [] Home Care/ [] Refused Recommended Disposition /[] [place] [facility]/ []  Follow-up with PCP Additional Notes: Pt states dermatitis on scalp flaring up. No appt available. Pt using OTC products, asking if something could be called in. Pt seen in July And has upcoming appt in Oct. Pt will schedule a MyChart virtual visit if still needs assistance after prescription tried. Home care reviewed.  Reason for Disposition  SEVERE itching (i.e., interferes with sleep, normal activities or school)  Answer Assessment - Initial Assessment Questions 1. MAIN SYMPTOM: "What is your main concern or symptom?" (e.g., dry, flaky, bumpy or rough, cracked, itchy)     Dry flaky sores, bleeds if scratches 2. LOCATION: "Where is the cracked or dry skin located?"       scalp 3. ONSET: "When did the cracked or dry skin begin?"     10 years 4. CAUSE: "What do you think is causing the cracked or dry skin?"     dermatitis 5. PAIN: "Is there any pain?" If Yes, ask: "How bad is the pain?"  (e.g., Scale 1-10; mild, moderate, or severe)     Painful 10 6. INFECTION: "Does the skin look red or infected?"     Unsure, scalp tender.  7. OTHER SYMPTOMS: "Do you have any other symptoms?" (e.g.,  fever, itching, rash or redness)     no 8. PREGNANCY: "Is there any chance you are pregnant?" "When was your last menstrual period?"     no  Protocols used: Cracked or Dry Skin-A-AH, Rash or Redness - [facility]

## 2021-09-12 NOTE — Telephone Encounter (Signed)
Multiple complaints:back pain, swelling under arms Chief Complaint: swelling under arms Symptoms: pain, swelling under arms Frequency: 3 months Pertinent Negatives: Patient denies fever Disposition: [] ED /[x] Urgent Care (no appt availability in office) / [] Appointment(In office/virtual)/ []  [place] [facility]/ [] Home Care/ [] Refused Recommended Disposition /[] [place] [facility]/ []  Follow-up with PCP Additional Notes: Patient advised UC for evaluation

## 2021-09-12 NOTE — Telephone Encounter (Signed)
Reason for Disposition  [1] Very tender to the touch AND [2] no fever  Answer Assessment - Initial Assessment Questions 1. ONSET: "When did the pain begin?"      chronic 2. LOCATION: "Where does it hurt?" (upper, mid or lower back)     All over- upper back/neck 3. SEVERITY: "How bad is the pain?"  (e.g., Scale 1-10; mild, moderate, or severe)   - MILD (1-3): Doesn't interfere with normal activities.    - MODERATE (4-7): Interferes with normal activities or awakens from sleep.    - SEVERE (8-10): Excruciating pain, unable to do any normal activities.      severe 4. PATTERN: "Is the pain constant?" (e.g., yes, no; constant, intermittent)      constant 5. RADIATION: "Does the pain shoot into your legs or somewhere else?"       6. CAUSE:  "What do you think is causing the back pain?"      No diagnosis 7. BACK OVERUSE:  "Any recent lifting of heavy objects, strenuous work or exercise?"     no 8. MEDICINES: "What have you taken so far for the pain?" (e.g., nothing, acetaminophen, NSAIDS)     Aleve,tylenol 9. NEUROLOGIC SYMPTOMS: "Do you have any weakness, numbness, or problems with bowel/bladder control?"     Radiates into legs 10. OTHER SYMPTOMS: "Do you have any other symptoms?" (e.g., fever, abdomen pain, burning with urination, blood in urine)       Swelling under arm- 3 months 11. PREGNANCY: "Is there any chance you are pregnant?" "When was your last menstrual period?"  Answer Assessment - Initial Assessment Questions 1. LOCATION: "Where is the swollen node located?" "Is the matching node on the other side of the body also swollen?"      Bilateral under arm swelling- left larger 2. SIZE: "How big is the node?" (e.g., inches or centimeters; or compared to common objects such as pea, bean, marble, golf ball)      Whole enlargement of area 3. ONSET: "When did the swelling start?"      3 months 4. NECK NODES: "Is there a sore throat, runny nose or other symptoms of a cold?"       no 5. GROIN OR ARMPIT NODES: "Is there a sore, scratch, cut or painful red area on that arm or leg?"      Swelling back of lower neck 6. FEVER: "Do you have a fever?" If Yes, ask: "What is it, how was it measured, and when did it start?"      no 7. CAUSE: "What do you think is causing the swollen lymph nodes?"     unsure 8. OTHER SYMPTOMS: "Do you have any other symptoms?"     no 9. PREGNANCY: "Is there any chance you are pregnant?" "When was your last menstrual period?"     No- not active  Protocols used: Back Pain-A-AH, Lymph Nodes - Swollen-A-AH

## 2021-09-13 ENCOUNTER — Other Ambulatory Visit: Payer: Self-pay

## 2021-09-13 NOTE — Telephone Encounter (Signed)
Called patient and left voicemail about the Rx

## 2021-09-13 NOTE — Telephone Encounter (Signed)
Called patient and left voicemail.

## 2021-09-16 ENCOUNTER — Other Ambulatory Visit: Payer: Self-pay

## 2021-09-16 ENCOUNTER — Telehealth: Payer: Self-pay | Admitting: Critical Care Medicine

## 2021-09-16 MED ORDER — SEMAGLUTIDE (2 MG/DOSE) 8 MG/3ML ~~LOC~~ SOPN
2.0000 mg | PEN_INJECTOR | SUBCUTANEOUS | 2 refills | Status: DC
  Filled 2021-10-09 – 2021-10-15 (×4): qty 3, 28d supply, fill #0
  Filled 2021-11-07 – 2021-11-11 (×2): qty 3, 28d supply, fill #1
  Filled 2021-12-09 (×2): qty 3, 28d supply, fill #2
  Filled ????-??-??: fill #1

## 2021-09-16 MED ORDER — SEMAGLUTIDE (2 MG/DOSE) 8 MG/3ML ~~LOC~~ SOPN
2.0000 mg | PEN_INJECTOR | SUBCUTANEOUS | 2 refills | Status: DC
  Filled 2021-09-16: qty 3, 28d supply, fill #0

## 2021-09-16 NOTE — Telephone Encounter (Signed)
Pt called information verified. She is tolerating the 1 mg weekly dose and is requesting the 2mg  dose for additional weight loss benefit. Rx sent. Will forward update to PCP.

## 2021-09-16 NOTE — Addendum Note (Signed)
Addended by: Shan Levans E on: 09/16/2021 05:35 PM   Modules accepted: Orders

## 2021-09-16 NOTE — Telephone Encounter (Signed)
Pt called and stated that now that she has been able to tolerate the Rx #: [id number]  Semaglutide, 1 MG/DOSE, 4 MG/3ML SOPN / she would like Dr. Delford Field to increase the dosage / please advise

## 2021-09-16 NOTE — Telephone Encounter (Signed)
Dosage of Ozempic was increased

## 2021-09-17 ENCOUNTER — Other Ambulatory Visit: Payer: Self-pay

## 2021-09-18 ENCOUNTER — Other Ambulatory Visit: Payer: Self-pay

## 2021-09-20 ENCOUNTER — Other Ambulatory Visit: Payer: Self-pay

## 2021-09-27 ENCOUNTER — Other Ambulatory Visit: Payer: Self-pay

## 2021-10-09 ENCOUNTER — Ambulatory Visit: Payer: Medicaid Other | Admitting: Physician Assistant

## 2021-10-10 ENCOUNTER — Other Ambulatory Visit: Payer: Self-pay

## 2021-10-11 ENCOUNTER — Other Ambulatory Visit: Payer: Self-pay

## 2021-10-15 ENCOUNTER — Other Ambulatory Visit: Payer: Self-pay

## 2021-10-18 ENCOUNTER — Other Ambulatory Visit: Payer: Self-pay

## 2021-10-25 ENCOUNTER — Other Ambulatory Visit: Payer: Self-pay

## 2021-10-30 ENCOUNTER — Other Ambulatory Visit: Payer: Self-pay

## 2021-10-30 ENCOUNTER — Telehealth (INDEPENDENT_AMBULATORY_CARE_PROVIDER_SITE_OTHER): Payer: No Payment, Other | Admitting: Psychiatry

## 2021-10-30 ENCOUNTER — Encounter (HOSPITAL_COMMUNITY): Payer: Self-pay | Admitting: Psychiatry

## 2021-10-30 DIAGNOSIS — F5105 Insomnia due to other mental disorder: Secondary | ICD-10-CM

## 2021-10-30 DIAGNOSIS — G4700 Insomnia, unspecified: Secondary | ICD-10-CM

## 2021-10-30 DIAGNOSIS — F4310 Post-traumatic stress disorder, unspecified: Secondary | ICD-10-CM

## 2021-10-30 DIAGNOSIS — F250 Schizoaffective disorder, bipolar type: Secondary | ICD-10-CM | POA: Diagnosis not present

## 2021-10-30 DIAGNOSIS — F99 Mental disorder, not otherwise specified: Secondary | ICD-10-CM

## 2021-10-30 MED ORDER — SERTRALINE HCL 50 MG PO TABS
50.0000 mg | ORAL_TABLET | Freq: Every day | ORAL | 3 refills | Status: DC
  Filled 2021-10-30: qty 30, 30d supply, fill #0

## 2021-10-30 MED ORDER — PRAZOSIN HCL 2 MG PO CAPS
2.0000 mg | ORAL_CAPSULE | Freq: Every day | ORAL | 2 refills | Status: DC
  Filled 2021-10-30: qty 30, 30d supply, fill #0

## 2021-10-30 MED ORDER — QUETIAPINE FUMARATE ER 200 MG PO TB24
200.0000 mg | ORAL_TABLET | Freq: Every day | ORAL | 3 refills | Status: DC
  Filled 2021-10-30 – 2022-01-05 (×2): qty 30, 30d supply, fill #0

## 2021-10-30 MED ORDER — LAMOTRIGINE 100 MG PO TABS
100.0000 mg | ORAL_TABLET | Freq: Every day | ORAL | 3 refills | Status: DC
  Filled 2021-10-30: qty 30, 30d supply, fill #0

## 2021-10-30 MED ORDER — QUETIAPINE FUMARATE 50 MG PO TABS
50.0000 mg | ORAL_TABLET | Freq: Every day | ORAL | 3 refills | Status: DC
  Filled 2021-10-30 – 2021-11-07 (×2): qty 30, 30d supply, fill #0
  Filled 2021-12-09: qty 30, 30d supply, fill #1
  Filled 2022-01-05: qty 30, 30d supply, fill #2

## 2021-10-30 MED ORDER — ZOLPIDEM TARTRATE 5 MG PO TABS
5.0000 mg | ORAL_TABLET | Freq: Every evening | ORAL | 3 refills | Status: DC | PRN
  Filled 2021-10-30 – 2021-12-09 (×3): qty 30, 30d supply, fill #0

## 2021-10-30 NOTE — Progress Notes (Signed)
BH MD/PA/NP OP Progress Note Virtual Visit via Telephone Note  I connected with Sarah Evans on 10/30/21 at 11:30 AM EDT by telephone and verified that I am speaking with the correct person using two identifiers.  Location: Patient: home Provider: Clinic   I discussed the limitations, risks, security and privacy concerns of performing an evaluation and management service by telephone and the availability of in person appointments. I also discussed with the patient that there may be a patient responsible charge related to this service. The patient expressed understanding and agreed to proceed.   I provided 30 minutes of non-face-to-face time during this encounter.           10/30/2021 11:36 AM Sarah Evans  MRN:  [id number]  Chief Complaint: "I still have issues sleeping"   HPI: 41 year old female seen today for follow up psychiatric evaluation. She has psychiatric history of schizoaffective disorder, bipolar 1, depression, anxiety, and PTSD. She is currently managed on Zoloft 50 mg daily, Prazosin 2 mg nightly, Lamictal 100 mg daily, Seroquel 200 mg nightly, and Seroquel 50 mg daily. She notes her medications are somewhat effective in managing her symptoms.   Today she was unable to login virtually so her assessment was done over the phone.  During exam she was pleasant, cooperative, and engaged in conversation.  She informed Clinical research associate that she continues to have issues sleeping.  She reports that she sleeps approximately 3 to 5 hours nightly.  Patient does note that since the increase in Seroquel and Lamictal her mood has been more stable.  She also informed writer that her anxiety and depression are well managed.  Provider conducted a GAD-7 and patient scored a 2, at her last visit she scored a 0.  Provider also conducted PHQ-9 and patient scored an 8, at her last visit she scored an 8.  Patient endorses having an adequate appetite.  Today she denies SI/HI/VAH, mania,  paranoia.  Patient continues to be on Ozempic and notes that she has lost another 10 pounds since her last visit.  She informed Clinical research associate that she continues to have back pain.  Currently she is taking Aleve to help manage this.  Today she is agreeable to starting Ambien 5 mg nightly as needed to help manage sleep. Potential side effects of medication and risks vs benefits of treatment vs non-treatment were explained and discussed. All questions were answered. She will continue her other medications as prescribed.        Visit Diagnosis:    ICD-10-CM   1. Insomnia due to other mental disorder  F51.05 zolpidem (AMBIEN) 5 MG tablet   F99     2. Schizoaffective disorder, bipolar type (HCC)  F25.0 lamoTRIgine (LAMICTAL) 100 MG tablet    QUEtiapine (SEROQUEL XR) 200 MG 24 hr tablet    QUEtiapine (SEROQUEL) 50 MG tablet    sertraline (ZOLOFT) 50 MG tablet    3. PTSD (post-traumatic stress disorder)  F43.10 prazosin (MINIPRESS) 2 MG capsule    sertraline (ZOLOFT) 50 MG tablet      Past Psychiatric History: Schizoaffective disorder, Bipolar 1, Depression, Anxiety, and PTSD  Past Medical History:  Past Medical History:  Diagnosis Date   Bipolar 1 disorder (HCC)    Herpes simplex virus (HSV) infection    dx 2001   Polycystic ovary disease    PTSD (post-traumatic stress disorder)    Schizophrenia (HCC)    Stroke [facility])     Past Surgical History:  Procedure Laterality Date   BUBBLE  STUDY  05/25/2020   Procedure: BUBBLE STUDY;  Surgeon: Chrystie Nose, MD;  Location: [facility] ENDOSCOPY;  Service: Cardiovascular;;   NO PAST SURGERIES     TEE WITHOUT CARDIOVERSION N/A 05/25/2020   Procedure: TRANSESOPHAGEAL ECHOCARDIOGRAM (TEE);  Surgeon: Chrystie Nose, MD;  Location: [facility] ENDOSCOPY;  Service: Cardiovascular;  Laterality: N/A;    Family Psychiatric History: Bipolar, Schizophrenia, and Depressed. Father Bipolar disorder.   Family History:  Family History  Problem Relation Age of Onset    Bipolar disorder Mother    Diabetes Maternal Grandmother    Hypertension Neg Hx    Heart disease Neg Hx    Cancer Neg Hx     Social History:  Social History   Socioeconomic History   Marital status: Single    Spouse name: Not on file   Number of children: Not on file   Years of education: Not on file   Highest education level: Not on file  Occupational History   Not on file  Tobacco Use   Smoking status: Former    Packs/day: 0.25    Years: 15.00    Total pack years: 3.75    Types: Cigarettes    Quit date: 05/02/2020    Years since quitting: 1.4   Smokeless tobacco: Never  Vaping Use   Vaping Use: Never used  Substance and Sexual Activity   Alcohol use: No   Drug use: No   Sexual activity: Not Currently    Birth control/protection: Abstinence  Other Topics Concern   Not on file  Social History Narrative   Not on file   Social Determinants of Health   Financial Resource Strain: Not on file  Food Insecurity: Not on file  Transportation Needs: Not on file  Physical Activity: Not on file  Stress: Not on file  Social Connections: Not on file    Allergies: No Known Allergies  Metabolic Disorder Labs: Lab Results  Component Value Date   HGBA1C 5.8 (H) 07/17/2021   MPG 117 06/15/2020   MPG 128.37 05/24/2020   Lab Results  Component Value Date   PROLACTIN 13.0 04/28/2015   Lab Results  Component Value Date   CHOL 220 (H) 07/17/2021   TRIG 101 07/17/2021   HDL 89 07/17/2021   CHOLHDL 2.5 07/17/2021   VLDL 28 05/24/2020   LDLCALC 114 (H) 07/17/2021   LDLCALC 63 05/24/2020   Lab Results  Component Value Date   TSH 2.110 04/02/2020   TSH 2.040 10/26/2018    Therapeutic Level Labs: No results found for: "LITHIUM" No results found for: "VALPROATE" No results found for: "CBMZ"  Current Medications: Current Outpatient Medications  Medication Sig Dispense Refill   zolpidem (AMBIEN) 5 MG tablet Take 1 tablet (5 mg total) by mouth at bedtime as needed  for sleep. 30 tablet 3   atorvastatin (LIPITOR) 10 MG tablet Take 1 tablet (10 mg total) by mouth daily. 30 tablet 2   clobetasol cream (TEMOVATE) 0.05 % Apply 1 application topically to the affected area(s) 2 (two) times daily. 30 g 0   diclofenac Sodium (VOLTAREN) 1 % GEL Apply 2 g topically 4 (four) times daily. 50 g 2   Eflornithine HCl (VANIQA) 13.9 % cream Apply to face twice daily as needed 45 g 2   ketoconazole (NIZORAL) 2 % shampoo Apply 1 Application topically 2 (two) times a week. 120 mL 0   lamoTRIgine (LAMICTAL) 100 MG tablet Take 1 tablet (100 mg total) by mouth daily. 30 tablet 3  meloxicam (MOBIC) 7.5 MG tablet Take 1 tablet (7.5 mg total) by mouth 2 (two) times daily as needed for pain. 30 tablet 2   metFORMIN (GLUCOPHAGE) 500 MG tablet Take 1 tablet (500 mg total) by mouth once daily with breakfast. 60 tablet 3   norgestimate-ethinyl estradiol (MILI) 0.25-35 MG-MCG tablet Take 1 tablet by mouth once daily. 28 tablet 11   ondansetron (ZOFRAN) 4 MG tablet Take 1 tablet (4 mg total) by mouth every 4 (four) hours as needed for nausea or vomiting. 8 tablet 0   pantoprazole (PROTONIX) 20 MG tablet Take 1 tablet (20 mg total) by mouth once daily. 30 tablet 1   prazosin (MINIPRESS) 2 MG capsule Take 1 capsule (2 mg total) by mouth once nightly at bedtime. 30 capsule 2   QUEtiapine (SEROQUEL XR) 200 MG 24 hr tablet Take 1 tablet (200 mg total) by mouth at bedtime. 30 tablet 3   QUEtiapine (SEROQUEL) 50 MG tablet Take 1 tablet (50 mg total) by mouth once daily. 30 tablet 3   Semaglutide, 2 MG/DOSE, 8 MG/3ML SOPN Inject 2 mg as directed once a week. 3 mL 2   sertraline (ZOLOFT) 50 MG tablet Take 1 tablet (50 mg total) by mouth daily. 30 tablet 3   spironolactone (ALDACTONE) 100 MG tablet take 1 tablet by mouth twice daily 390 tablet 2   valACYclovir (VALTREX) 1000 MG tablet Take 1 tablet (1,000 mg total) by mouth once daily. 60 tablet 2   No current facility-administered medications for  this visit.     Musculoskeletal: Strength & Muscle Tone:  Unable to assess due to telephone visit Gait & Station:  Unable to assess due to telephone visit Patient leans: N/A  Psychiatric Specialty Exam: Review of Systems  There were no vitals taken for this visit.There is no height or weight on file to calculate BMI.  General Appearance:  Unable to assess due to telephone visit  Eye Contact:   Unable to assess due to telephone visit  Speech:  Clear and Coherent and Normal Rate  Volume:  Normal  Mood:  Euthymic  Affect:  Congruent  Thought Process:  Coherent, Goal Directed and Linear  Orientation:  Full (Time, Place, and Person)  Thought Content: WDL and Logical   Suicidal Thoughts:  No  Homicidal Thoughts:  No  Memory:  Immediate;   Good Recent;   Good Remote;   Good  Judgement:  Good  Insight:  Good  Psychomotor Activity:  Normal  Concentration:  Concentration: Good and Attention Span: Good  Recall:  Good  Fund of Knowledge: Good  Language: Good  Akathisia:   Unable to assess due to telephone visit  Handed:  Right  AIMS (if indicated): Not done  Assets:  Communication Skills Desire for Improvement Housing Leisure Time Social Support  ADL's:  Intact  Cognition: WNL  Sleep:  Poor   Screenings: AUDIT    Flowsheet Row Admission (Discharged) from 04/27/2015 in [facility]  Alcohol Use Disorder Identification Test Final Score (AUDIT) 0      GAD-7    Flowsheet Row Video Visit from 10/30/2021 in [facility] Most recent reading at 10/30/2021 11:29 AM Office Visit from 07/24/2021 in [place] Most recent reading at 07/24/2021 10:08 AM Video Visit from 07/24/2021 in [facility] Most recent reading at 07/24/2021  8:47 AM Video Visit from 01/21/2021 in [facility] Most recent reading at 01/21/2021  1:07 PM Office Visit from  11/01/2020 in [facility] Most recent reading at 11/01/2020  8:55 AM  Total GAD-7 Score 2 0 9 15 21       PHQ2-9    Flowsheet Row Video Visit from 10/30/2021 in [facility] Most recent reading at 10/30/2021 11:26 AM Office Visit from 07/24/2021 in [facility] Most recent reading at 07/24/2021 10:07 AM Video Visit from 07/24/2021 in [facility] Most recent reading at 07/24/2021  8:48 AM Video Visit from 01/21/2021 in [facility] Most recent reading at 01/21/2021  1:05 PM Counselor from 12/24/2020 in [facility] Most recent reading at 12/24/2020  2:37 PM  PHQ-2 Total Score 2 0 2 6 6   PHQ-9 Total Score 8 8 11 20 24       Flowsheet Row ED from 05/22/2021 in [facility] from 04[facility]11/2021 in [facility] Video Visit from 01/21/2021 in [facility]  C-SSRS RISK CATEGORY No Risk No Risk No Risk        Assessment and Plan: Patient endorses poor sleep but notes that her mood, anxiety, and depression has improved since her last visit.Today she is agreeable to starting Ambien 5 mg nightly as needed to help manage sleep.  She will continue her other medications as prescribed.  1. Schizoaffective disorder, bipolar type (HCC)  Continue- lamoTRIgine (LAMICTAL) 100 MG tablet; Take 1 tablet (100 mg total) by mouth daily.  Dispense: 30 tablet; Refill: 3 Continue- QUEtiapine (SEROQUEL XR) 200 MG 24 hr tablet; Take 1 tablet (200 mg total) by mouth at bedtime.  Dispense: 30 tablet; Refill: 3 Continue- QUEtiapine (SEROQUEL) 50 MG tablet; Take 1 tablet (50 mg total) by mouth once daily.  Dispense: 30 tablet; Refill: 3 Continue- sertraline (ZOLOFT) 50 MG tablet; Take 1 tablet (50 mg total) by mouth daily.  Dispense: 30 tablet; Refill: 3  2. PTSD  (post-traumatic stress disorder)  Continue- prazosin (MINIPRESS) 2 MG capsule; Take 1 capsule (2 mg total) by mouth once nightly at bedtime.  Dispense: 30 capsule; Refill: 2 Continue- sertraline (ZOLOFT) 50 MG tablet; Take 1 tablet (50 mg total) by mouth daily.  Dispense: 30 tablet; Refill: 3  3. Insomnia due to other mental disorder  Continue- zolpidem (AMBIEN) 5 MG tablet; Take 1 tablet (5 mg total) by mouth at bedtime as needed for sleep.  Dispense: 30 tablet; Refill: 3     Follow up in 3 months Follow-up with therapy   [facility], NP 10/30/2021, 11:36 AM

## 2021-11-06 ENCOUNTER — Other Ambulatory Visit: Payer: Self-pay

## 2021-11-07 ENCOUNTER — Other Ambulatory Visit: Payer: Self-pay

## 2021-11-11 ENCOUNTER — Ambulatory Visit: Payer: Self-pay | Admitting: *Deleted

## 2021-11-11 ENCOUNTER — Other Ambulatory Visit: Payer: Self-pay

## 2021-11-11 NOTE — Telephone Encounter (Signed)
  Chief Complaint: advice Symptoms: weight loss Frequency:  Pertinent Negatives: NA Disposition: [] ED /[] Urgent Care (no appt availability in office) / [] Appointment(In office/virtual)/ []  [place] [facility]/ [x] Home Care/ [] Refused Recommended Disposition /[]  Mobile Bus/ []  Follow-up with PCP Additional Notes: pt was advised on weight range for 5'7" female of 122-149. Pt current weight is 185 and she is wanting to continue losing weight. Was asking about how much weight to lose in 1 month and then maintain. I recommended her to reach out to [facility]. Advised would send Mychart message with their number. Pt verbalized understanding.   Reason for Disposition  Health Information question, no triage required and triager able to answer question  Answer Assessment - Initial Assessment Questions 1. REASON FOR CALL or QUESTION: "What is your reason for calling today?" or "How can I best help you?" or "What question do you have that I can help answer?"     Wanting to know weight range for her height that isn't considered overweight  Protocols used: Information Only Call - No Triage-A-AH

## 2021-11-11 NOTE — Telephone Encounter (Signed)
Summary: healthy avaerage weight for a person 79ft 7inches   Pt asked to speak with a nurse to find out what the average healthy weight would be for a person her height / pt is 5'7 / please advise      Called patient to review health weight for 5'7. No answer, LVMTCB [phone].

## 2021-11-12 ENCOUNTER — Other Ambulatory Visit: Payer: Self-pay

## 2021-11-15 ENCOUNTER — Other Ambulatory Visit: Payer: Self-pay

## 2021-11-25 ENCOUNTER — Other Ambulatory Visit: Payer: Self-pay

## 2021-11-27 ENCOUNTER — Ambulatory Visit: Payer: Medicaid Other | Admitting: Critical Care Medicine

## 2021-12-01 NOTE — Progress Notes (Deleted)
Established Patient Office Visit  Subjective:  Patient ID: Sarah Evans, female    DOB: [birth year]  Age: 41 y.o. MRN: [id number]    CC:  Primary care follow-up  HPI 10/2020 Sarah Evans presents for assistance with filling out mental and physical residual functional capacity assessments.  The patient had a significant stroke earlier this year leaving the patient with left upper extremity weakness and pain.  She also has chronic midthoracic and neck pain as well.  Patient has hyperlipidemia with type 2 diabetes and severe bipolar disorder and severe depression.  She has had psychotic features but not full-blown schizophrenia.  Despite treatment with mental health her depression and bipolar disorder have been significantly impacting her abilities.    The patient has not been able to perform her job and has been out of work for some time.  She sits at home is not able to engage in any social interactions.  Note she is not suicidal.  She was not able to focus on her job and the back pain and left upper extremity weakness and pain have created a situation where she drops objects frequently.  She also has left leg weakness cannot walk.  She has chronic headaches dizziness and feels faint.  The patient also has visual acuity issues in the left eye after the stroke  On arrival blood pressure is good 118/80.  Her diabetes has been reasonably controlled on metformin alone.  She is on statin therapy along with medication for blood pressure.  Minimal residual functional assessment is done as outlined below and per the forms functional residual capacity also is done per forms below.  6/15   This patient is seen today by way of a video visit.  She is in her bedroom at home.  She is in no distress on the video.  Her chief complaint is that of increased weight gain and considering potential Ozempic.  Unfortunately she only has family-planning Medicaid.  She works as a Scientist, water quality at [employer].  She works  about 20 hours a week.  She only walks about 3000 steps a day.  She eats a lot of vegetables in her diet.  Her weight has increased and her BMI is 30.  Patient does have prediabetes.  A1c has been elevated in the past needs to be rechecked.  Patient's had previous stroke and is on statin therapy.  Was in the ER in May for abdominal pain found to have cholelithiasis.  She was to follow-up with general surgery for potential resection but this is yet to occur.  Patient also has bipolar disorder and needs refills on her Seroquel.  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  There are no other complaints. See Epworth scale total score is 15 the patient was given Norco in the emergency room in May she still has a bottle of medicine and takes it rarely Patient had a Pap smear in May was normal  7/19 This patient is seen in return follow-up and notes that her mental health is improved on her current program and she is followed closely at the behavioral [place].  She now does have the [place] discount available.  Also has the orange card.  On arrival blood pressure 107/70.  She still having heavy menstrual periods and would like a prescription for birth control medications.  We have sent that to our pharmacy downstairs.  Patient  also saw gynecology earlier this year and had a normal Pap smear.  She is also on Aldactone for PCOS.  Patient still has hirsutism despite the Aldactone.  Patient is interested in topical therapy for hirsutism.  Patient complains of bilateral knee pain at this time.  She has difficulty with walking.  There are no stroke symptoms at this time.  She has lost some weight.  She is taking her statin therapy.  Labs recently done all were improved.  11/28  History of stroke History of ischemic stroke stable at this time continue lipid therapy  Sleep apnea Sleep study pending  Cholelithiasis We will monitor  for now  Hirsutism Topical therapy prescribed  Schizoaffective disorder, bipolar type ([place]) Stable on current mental health medications refills available to the patient managed by mental health  PTSD (post-traumatic stress disorder) As per bipolar assessment  Prediabetes Now on semaglutide injections weekly and metformin for prediabetes and PCOS  Moderate episode of recurrent major depressive disorder ([place]) As per mental health  Mixed hyperlipidemia Continue statins  Generalized anxiety disorder As per mental health  HSV (herpes simplex virus) anogenital infection Continue with Valtrex suppressive therapy  Chronic pain of both knees Chronic pain in both knees likely osteoarthritis in the medial compartments of both knees plan is to give knee exercises and topical Voltaren gel and referral to orthopedics note she has the orange card   Sarah Evans was seen today for pain and medication refill.  Diagnoses and all orders for this visit:  Chronic pain of both knees -     Ambulatory referral to Orthopedic Surgery  PCOS (polycystic ovarian syndrome) -     norgestimate-ethinyl estradiol (MILI) 0.25-35 MG-MCG tablet; Take 1 tablet by mouth once daily.  Gastroesophageal reflux disease without esophagitis -     pantoprazole (PROTONIX) 20 MG tablet; Take 1 tablet (20 mg total) by mouth once daily.  Past Medical History:  Diagnosis Date   Bipolar 1 disorder ([place])    Herpes simplex virus (HSV) infection    dx 2001   Polycystic ovary disease    PTSD (post-traumatic stress disorder)    Schizophrenia ([place])    Stroke [facility])     Past Surgical History:  Procedure Laterality Date   BUBBLE STUDY  05/25/2020   Procedure: BUBBLE STUDY;  Surgeon: Pixie Casino, MD;  Location: [place];  Service: Cardiovascular;;   NO PAST SURGERIES     TEE WITHOUT CARDIOVERSION N/A 05/25/2020   Procedure: TRANSESOPHAGEAL ECHOCARDIOGRAM (TEE);  Surgeon: Pixie Casino, MD;  Location: [facility] ENDOSCOPY;   Service: Cardiovascular;  Laterality: N/A;    Family History  Problem Relation Age of Onset   Bipolar disorder Mother    Diabetes Maternal Grandmother    Hypertension Neg Hx    Heart disease Neg Hx    Cancer Neg Hx     Social History   Socioeconomic History   Marital status: Single    Spouse name: Not on file   Number of children: Not on file   Years of education: Not on file   Highest education level: Not on file  Occupational History   Not on file  Tobacco Use   Smoking status: Former    Packs/day: 0.25    Years: 15.00    Total pack years: 3.75    Types: Cigarettes    Quit date: 05/02/2020    Years since quitting: 1.5   Smokeless tobacco: Never  Vaping Use   Vaping Use: Never used  Substance and Sexual  Activity   Alcohol use: No   Drug use: No   Sexual activity: Not Currently    Birth control/protection: Abstinence  Other Topics Concern   Not on file  Social History Narrative   Not on file   Social Determinants of Health   Financial Resource Strain: Not on file  Food Insecurity: Not on file  Transportation Needs: Not on file  Physical Activity: Not on file  Stress: Not on file  Social Connections: Not on file  Intimate Partner Violence: Not on file    Outpatient Medications Prior to Visit  Medication Sig Dispense Refill   atorvastatin (LIPITOR) 10 MG tablet Take 1 tablet (10 mg total) by mouth daily. 30 tablet 2   clobetasol cream (TEMOVATE) 0.10 % Apply 1 application topically to the affected area(s) 2 (two) times daily. 30 g 0   diclofenac Sodium (VOLTAREN) 1 % GEL Apply 2 g topically 4 (four) times daily. 50 g 2   Eflornithine HCl (VANIQA) 13.9 % cream Apply to face twice daily as needed 45 g 2   ketoconazole (NIZORAL) 2 % shampoo Apply 1 Application topically 2 (two) times a week. 120 mL 0   lamoTRIgine (LAMICTAL) 100 MG tablet Take 1 tablet (100 mg total) by mouth daily. 30 tablet 3   meloxicam (MOBIC) 7.5 MG tablet Take 1 tablet (7.5 mg total) by  mouth 2 (two) times daily as needed for pain. 30 tablet 2   metFORMIN (GLUCOPHAGE) 500 MG tablet Take 1 tablet (500 mg total) by mouth once daily with breakfast. 60 tablet 3   norgestimate-ethinyl estradiol (MILI) 0.25-35 MG-MCG tablet Take 1 tablet by mouth once daily. 28 tablet 11   ondansetron (ZOFRAN) 4 MG tablet Take 1 tablet (4 mg total) by mouth every 4 (four) hours as needed for nausea or vomiting. 8 tablet 0   pantoprazole (PROTONIX) 20 MG tablet Take 1 tablet (20 mg total) by mouth once daily. 30 tablet 1   prazosin (MINIPRESS) 2 MG capsule Take 1 capsule (2 mg total) by mouth once nightly at bedtime. 30 capsule 2   QUEtiapine (SEROQUEL XR) 200 MG 24 hr tablet Take 1 tablet (200 mg total) by mouth at bedtime. 30 tablet 3   QUEtiapine (SEROQUEL) 50 MG tablet Take 1 tablet (50 mg total) by mouth once daily. 30 tablet 3   Semaglutide, 2 MG/DOSE, 8 MG/3ML SOPN Inject 2 mg as directed once a week. 3 mL 2   sertraline (ZOLOFT) 50 MG tablet Take 1 tablet (50 mg total) by mouth daily. 30 tablet 3   spironolactone (ALDACTONE) 100 MG tablet take 1 tablet by mouth twice daily 390 tablet 2   valACYclovir (VALTREX) 1000 MG tablet Take 1 tablet (1,000 mg total) by mouth once daily. 60 tablet 2   zolpidem (AMBIEN) 5 MG tablet Take 1 tablet (5 mg total) by mouth at bedtime as needed for sleep. 30 tablet 3   No facility-administered medications prior to visit.    No Known Allergies  ROS Review of Systems  Constitutional:  Negative for activity change and fatigue.  HENT: Negative.  Negative for ear pain, postnasal drip, rhinorrhea, sinus pressure, sore throat, trouble swallowing and voice change.   Eyes:  Negative for visual disturbance.  Respiratory: Negative.  Negative for apnea, cough, choking, chest tightness, shortness of breath, wheezing and stridor.   Cardiovascular: Negative.  Negative for chest pain, palpitations and leg swelling.  Gastrointestinal:  Negative for abdominal distention,  abdominal pain, nausea and vomiting.  Genitourinary:  Negative.   Musculoskeletal:  Negative for arthralgias, back pain, gait problem, myalgias, neck pain and neck stiffness.       Bilateral knee pain  Skin: Negative.  Negative for rash.  Allergic/Immunologic: Negative.  Negative for environmental allergies and food allergies.  Neurological:  Negative for dizziness, tremors, syncope, facial asymmetry, weakness, light-headedness, numbness and headaches.  Hematological: Negative.  Negative for adenopathy. Does not bruise/bleed easily.  Psychiatric/Behavioral:  Negative for agitation, behavioral problems, confusion, decreased concentration, dysphoric mood, hallucinations, self-injury, sleep disturbance and suicidal ideas. The patient is not nervous/anxious and is not hyperactive.       Objective:   There were no vitals filed for this visit.   Gen: Pleasant, well-nourished, in no distress,  normal affect  ENT: No lesions,  mouth clear,  oropharynx clear, no postnasal drip  Neck: No JVD, no TMG, no carotid bruits  Lungs: No use of accessory muscles, no dullness to percussion, clear without rales or rhonchi  Cardiovascular: RRR, heart sounds normal, no murmur or gallops, no peripheral edema  Abdomen: soft and NT, no HSM,  BS normal  Musculoskeletal: No deformities, no cyanosis or clubbing, tenderness in both knees but full range of motion tenderness is medial there is no evidence of patellar chondromalacia on exam  Neuro: alert, non focal  Skin: Warm, no lesions or rashes, mild hirsutism on face  There were no vitals taken for this visit. Wt Readings from Last 3 Encounters:  07/24/21 196 lb (88.9 kg)  05/30/21 190 lb 12.8 oz (86.5 kg)  05/22/21 194 lb (88 kg)     Health Maintenance Due  Topic Date Due   COVID-19 Vaccine (1) Never done   OPHTHALMOLOGY EXAM  Never done   Diabetic kidney evaluation - Urine ACR  05/21/2021   INFLUENZA VACCINE  08/06/2021    There are no  preventive care reminders to display for this patient.  Lab Results  Component Value Date   TSH 2.110 04/02/2020   Lab Results  Component Value Date   WBC 5.9 07/17/2021   HGB 13.7 07/17/2021   HCT 41.5 07/17/2021   MCV 83 07/17/2021   PLT 447 07/17/2021   Lab Results  Component Value Date   NA 137 07/17/2021   K 5.0 07/17/2021   CO2 21 07/17/2021   GLUCOSE 90 07/17/2021   BUN 16 07/17/2021   CREATININE 1.14 (H) 07/17/2021   BILITOT 0.6 07/17/2021   ALKPHOS 105 07/17/2021   AST 18 07/17/2021   ALT 22 07/17/2021   PROT 7.5 07/17/2021   ALBUMIN 4.3 07/17/2021   CALCIUM 9.4 07/17/2021   ANIONGAP 5 05/22/2021   EGFR 63 07/17/2021   Lab Results  Component Value Date   CHOL 220 (H) 07/17/2021   Lab Results  Component Value Date   HDL 89 07/17/2021   Lab Results  Component Value Date   LDLCALC 114 (H) 07/17/2021   Lab Results  Component Value Date   TRIG 101 07/17/2021   Lab Results  Component Value Date   CHOLHDL 2.5 07/17/2021         Assessment & Plan:  I personally reviewed all images and lab data in the [facility] system as well as any outside material available during this office visit and agree with the  radiology impressions.   No problem-specific Assessment & Plan notes found for this encounter.   There are no diagnoses linked to this encounter.

## 2021-12-03 ENCOUNTER — Ambulatory Visit: Payer: Medicaid Other | Admitting: Critical Care Medicine

## 2021-12-09 ENCOUNTER — Other Ambulatory Visit: Payer: Self-pay

## 2021-12-10 ENCOUNTER — Other Ambulatory Visit: Payer: Self-pay

## 2021-12-17 ENCOUNTER — Other Ambulatory Visit: Payer: Self-pay

## 2021-12-17 ENCOUNTER — Telehealth: Payer: Self-pay | Admitting: Family Medicine

## 2021-12-17 NOTE — Telephone Encounter (Signed)
Left message that if she continues to have questions/comments/concerns to please give the office a call.    Sarah Evans  12/17/21

## 2021-12-17 NOTE — Telephone Encounter (Signed)
Patient is requesting a ultra sound for her PCOS.

## 2021-12-19 ENCOUNTER — Ambulatory Visit: Payer: Medicaid Other | Admitting: Orthopaedic Surgery

## 2021-12-24 ENCOUNTER — Ambulatory Visit: Payer: Medicaid Other | Admitting: Orthopaedic Surgery

## 2022-01-01 ENCOUNTER — Telehealth (HOSPITAL_COMMUNITY): Payer: No Payment, Other | Admitting: Student in an Organized Health Care Education/Training Program

## 2022-01-05 ENCOUNTER — Other Ambulatory Visit: Payer: Self-pay | Admitting: Critical Care Medicine

## 2022-01-06 ENCOUNTER — Other Ambulatory Visit: Payer: Self-pay

## 2022-01-07 ENCOUNTER — Other Ambulatory Visit: Payer: Self-pay

## 2022-01-07 ENCOUNTER — Other Ambulatory Visit: Payer: Self-pay | Admitting: Critical Care Medicine

## 2022-01-07 MED ORDER — OZEMPIC (2 MG/DOSE) 8 MG/3ML ~~LOC~~ SOPN
2.0000 mg | PEN_INJECTOR | SUBCUTANEOUS | 2 refills | Status: DC
  Filled 2022-01-07: qty 3, 28d supply, fill #0
  Filled 2022-01-31: qty 3, 28d supply, fill #1
  Filled 2022-02-28: qty 3, 28d supply, fill #2

## 2022-01-07 NOTE — Telephone Encounter (Signed)
Requested Prescriptions  Pending Prescriptions Disp Refills   Semaglutide, 2 MG/DOSE, (OZEMPIC, 2 MG/DOSE,) 8 MG/3ML SOPN 3 mL 2    Sig: Inject 2 mg as directed once a week.     Endocrinology:  Diabetes - GLP-1 Receptor Agonists - semaglutide Failed - 01/07/2022 12:34 PM      Failed - HBA1C in normal range and within 180 days    Hgb A1c MFr Bld  Date Value Ref Range Status  07/17/2021 5.8 (H) 4.8 - 5.6 % Final    Comment:             Prediabetes: 5.7 - 6.4          Diabetes: >6.4          Glycemic control for adults with diabetes: <7.0          Failed - Cr in normal range and within 360 days    Creatinine, Ser  Date Value Ref Range Status  07/17/2021 1.14 (H) 0.57 - 1.00 mg/dL Final         Passed - Valid encounter within last 6 months    Recent Outpatient Visits           5 months ago Chronic pain of both knees   [place], MD   6 months ago Prediabetes   [place], MD   8 months ago Obesity (BMI 30-39.9)   [place], [name], NP   1 year ago History of ischemic stroke   [place], [place]   1 year ago Chronic thoracic back pain, unspecified back pain laterality   [place], [place]

## 2022-01-14 ENCOUNTER — Other Ambulatory Visit: Payer: Self-pay

## 2022-01-16 ENCOUNTER — Other Ambulatory Visit: Payer: Self-pay

## 2022-01-16 ENCOUNTER — Other Ambulatory Visit: Payer: Self-pay | Admitting: Critical Care Medicine

## 2022-01-16 DIAGNOSIS — F5105 Insomnia due to other mental disorder: Secondary | ICD-10-CM

## 2022-01-16 NOTE — Telephone Encounter (Signed)
Medication Refill - Medication: zolpidem (AMBIEN) 5 MG tablet   Has the patient contacted their pharmacy? No. Pt requests that the Rx be sent to a new pharmacy  Preferred Pharmacy (with phone number or street name): [facility] [place], [address] [facility] Phone: [phone]  Fax: [phone]   Has the patient been seen for an appointment in the last year OR does the patient have an upcoming appointment? Yes.    Agent: Please be advised that RX refills may take up to 3 business days. We ask that you follow-up with your pharmacy.

## 2022-01-17 MED ORDER — ZOLPIDEM TARTRATE 5 MG PO TABS: 5.0000 mg | ORAL_TABLET | Freq: Every evening | ORAL | 3 refills | Status: DC | PRN

## 2022-01-17 NOTE — Telephone Encounter (Signed)
Requested medications are due for refill today.  Too soon  Requested medications are on the active medications list.  yes  Last refill. 10/30/2021 #30 3 rf  Future visit scheduled.   no  Notes to clinic.  Refill not delegated. Note from PT:  Pt requests that rx be sent to a new pharmacy -  Preferred Pharmacy (with phone number or street name): [facility] [place], [address] AT [facility] Phone: [phone]  Fax: [phone]      Requested Prescriptions  Pending Prescriptions Disp Refills   zolpidem (AMBIEN) 5 MG tablet 30 tablet 3    Sig: Take 1 tablet (5 mg total) by mouth at bedtime as needed for sleep.     Not Delegated - Psychiatry:  Anxiolytics/Hypnotics Failed - 01/16/2022  4:06 PM      Failed - This refill cannot be delegated      Failed - Urine Drug Screen completed in last 360 days      Passed - Valid encounter within last 6 months    Recent Outpatient Visits           5 months ago Chronic pain of both knees   [place], MD   7 months ago Prediabetes   [place] Elsie Stain, MD   9 months ago Obesity (BMI 30-39.9)   [place], [name], NP   1 year ago History of ischemic stroke   [place], MD   1 year ago Chronic thoracic back pain, unspecified back pain laterality   [place], [place]

## 2022-01-20 ENCOUNTER — Other Ambulatory Visit: Payer: Self-pay

## 2022-01-21 ENCOUNTER — Other Ambulatory Visit: Payer: Self-pay

## 2022-01-21 ENCOUNTER — Telehealth: Payer: Self-pay

## 2022-01-21 NOTE — Telephone Encounter (Signed)
Zolpidem prior auth approved until 07/20/2022

## 2022-01-27 ENCOUNTER — Ambulatory Visit: Payer: Medicaid Other | Admitting: Certified Nurse Midwife

## 2022-01-27 NOTE — Progress Notes (Signed)
Pt did not come to appt

## 2022-01-29 ENCOUNTER — Telehealth: Payer: Self-pay | Admitting: Critical Care Medicine

## 2022-01-29 NOTE — Telephone Encounter (Signed)
Copied from [place] [phone]. Topic: General - Other >> Jan 29, 2022 11:34 AM Ludger Nutting wrote: Patient states that she needs a letter to provide to her leasing office showing that she needs to have an emotional support animal so that she can keep her bird at her apartment. Offered patient next available appointment in April. Patient asked if she could be seen sooner or if pcp could just write the letter. Please advise.

## 2022-01-31 ENCOUNTER — Other Ambulatory Visit: Payer: Self-pay

## 2022-01-31 ENCOUNTER — Telehealth (INDEPENDENT_AMBULATORY_CARE_PROVIDER_SITE_OTHER): Payer: No Payment, Other | Admitting: Psychiatry

## 2022-01-31 ENCOUNTER — Encounter (HOSPITAL_COMMUNITY): Payer: Self-pay | Admitting: Psychiatry

## 2022-01-31 DIAGNOSIS — F5105 Insomnia due to other mental disorder: Secondary | ICD-10-CM

## 2022-01-31 DIAGNOSIS — F4310 Post-traumatic stress disorder, unspecified: Secondary | ICD-10-CM | POA: Diagnosis not present

## 2022-01-31 DIAGNOSIS — F250 Schizoaffective disorder, bipolar type: Secondary | ICD-10-CM | POA: Diagnosis not present

## 2022-01-31 DIAGNOSIS — F99 Mental disorder, not otherwise specified: Secondary | ICD-10-CM | POA: Diagnosis not present

## 2022-01-31 MED ORDER — QUETIAPINE FUMARATE 50 MG PO TABS
50.0000 mg | ORAL_TABLET | Freq: Every day | ORAL | 3 refills | Status: DC
  Filled 2022-01-31: qty 30, 30d supply, fill #0

## 2022-01-31 MED ORDER — ZOLPIDEM TARTRATE 5 MG PO TABS: 5.0000 mg | ORAL_TABLET | Freq: Every evening | ORAL | 3 refills | Status: DC | PRN

## 2022-01-31 MED ORDER — PRAZOSIN HCL 2 MG PO CAPS
2.0000 mg | ORAL_CAPSULE | Freq: Every day | ORAL | 2 refills | Status: DC
  Filled 2022-01-31 – 2022-05-30 (×2): qty 30, 30d supply, fill #0

## 2022-01-31 MED ORDER — SERTRALINE HCL 50 MG PO TABS
50.0000 mg | ORAL_TABLET | Freq: Every day | ORAL | 3 refills | Status: DC
  Filled 2022-01-31 – 2022-05-31 (×2): qty 30, 30d supply, fill #0

## 2022-01-31 MED ORDER — QUETIAPINE FUMARATE ER 150 MG PO TB24
150.0000 mg | ORAL_TABLET | Freq: Every day | ORAL | 30 refills | Status: DC
  Filled 2022-01-31 – 2022-02-12 (×2): qty 30, 30d supply, fill #0

## 2022-01-31 NOTE — Progress Notes (Signed)
BH MD/PA/NP OP Progress Note Virtual Visit via Video Note  I connected with Sarah Evans on 01/31/22 at  9:30 AM EST by a video enabled telemedicine application and verified that I am speaking with the correct person using two identifiers.  Location: Patient: Home Provider: Clinic   I discussed the limitations of evaluation and management by telemedicine and the availability of in person appointments. The patient expressed understanding and agreed to proceed.  I provided 30 minutes of non-face-to-face time during this encounter.             01/31/2022 12:35 PM Sarah Evans  MRN:  [id number]  Chief Complaint: "I am sleeping so much better"    HPI: 42 year old female seen today for follow up psychiatric evaluation. She has psychiatric history of schizoaffective disorder, bipolar 1, depression, anxiety, and PTSD. She is currently managed on Zoloft 50 mg daily, Prazosin 2 mg nightly, Lamictal 100 mg daily, Seroquel 200 mg nightly, and Seroquel 50 mg daily. She notes her medications are effective in managing her symptoms.   Today she logged in virtually but her camera was turned off.  During exam she was pleasant, cooperative, and engaged in conversation.  She informed Probation officer that since starting Ambien she has been sleeping better.  She notes that her anxiety and depression are well-managed.  Provider conducted a GAD-7 and patient scored a 2, at her last visit she scored a 2.  Provider also conducted PHQ-9 and patient scored a 10, at her last visit she scored an 8.  Patient informed Probation officer that she continues to feel paranoid and experience auditory hallucinations.  Today she denies SI/HI or mania.    Patient notes that she is concerned that she will not be allowed to keep her emotional support animal at her apartment.  Patient has a Wellsite geologist and reports that it has been successful in helping her manage her psychiatric conditions.  Patient informed Probation officer that she spoke to a counselor  who wrote her a letter to present however it was not excepted.  Provider was agreeable to writing patient a letter.  Patient notes that work at [employer] is going well.  She informed Probation officer that her boss yelled and cursed at her which she notes was overwhelming.  She informed Probation officer however that her boss apologized and she is now seeking to move forward.  She also notes that she has been pursuing her disability.  Patient continues to be on Ozempic and continues to lose weight.  She notes that she now weighs 170.  She informed Probation officer that she continues to have back pain.  Currently she is taking Aleve to help manage this.  Today no medication changes made.  Patient informed that Ambien will be tapered or discontinued in the near future.  She endorsed understanding and agreed. No medication changes made today. Patient has not had labs drawn in over 7 months. Today provider ordered thyroid panel, Unity S, Hgb A1c, LFT, and a CBC. No other concerns noted at this time.        Visit Diagnosis:    ICD-10-CM   1. Schizoaffective disorder, bipolar type ([place])  F25.0 CBC w/Diff/Platelet    Hepatic function panel    POCT Urine Drug Screen    Thyroid Panel With TSH    HgB A1c    QUEtiapine (SEROQUEL) 50 MG tablet    QUEtiapine Fumarate (SEROQUEL XR) 150 MG 24 hr tablet    sertraline (ZOLOFT) 50 MG tablet    2. PTSD (  post-traumatic stress disorder)  F43.10 prazosin (MINIPRESS) 2 MG capsule    sertraline (ZOLOFT) 50 MG tablet    3. Insomnia due to other mental disorder  F51.05 zolpidem (AMBIEN) 5 MG tablet   F99       Past Psychiatric History: Schizoaffective disorder, Bipolar 1, Depression, Anxiety, and PTSD  Past Medical History:  Past Medical History:  Diagnosis Date   Bipolar 1 disorder (HCC)    Herpes simplex virus (HSV) infection    dx 2001   Polycystic ovary disease    PTSD (post-traumatic stress disorder)    Schizophrenia (HCC)    Stroke [facility])     Past Surgical History:   Procedure Laterality Date   BUBBLE STUDY  05/25/2020   Procedure: BUBBLE STUDY;  Surgeon: Chrystie Nose, MD;  Location: [facility] ENDOSCOPY;  Service: Cardiovascular;;   NO PAST SURGERIES     TEE WITHOUT CARDIOVERSION N/A 05/25/2020   Procedure: TRANSESOPHAGEAL ECHOCARDIOGRAM (TEE);  Surgeon: Chrystie Nose, MD;  Location: [facility] ENDOSCOPY;  Service: Cardiovascular;  Laterality: N/A;    Family Psychiatric History: Bipolar, Schizophrenia, and Depressed. Father Bipolar disorder.   Family History:  Family History  Problem Relation Age of Onset   Bipolar disorder Mother    Diabetes Maternal Grandmother    Hypertension Neg Hx    Heart disease Neg Hx    Cancer Neg Hx     Social History:  Social History   Socioeconomic History   Marital status: Single    Spouse name: Not on file   Number of children: Not on file   Years of education: Not on file   Highest education level: Not on file  Occupational History   Not on file  Tobacco Use   Smoking status: Former    Packs/day: 0.25    Years: 15.00    Total pack years: 3.75    Types: Cigarettes    Quit date: 05/02/2020    Years since quitting: 1.7   Smokeless tobacco: Never  Vaping Use   Vaping Use: Never used  Substance and Sexual Activity   Alcohol use: No   Drug use: No   Sexual activity: Not Currently    Birth control/protection: Abstinence  Other Topics Concern   Not on file  Social History Narrative   Not on file   Social Determinants of Health   Financial Resource Strain: Not on file  Food Insecurity: Not on file  Transportation Needs: Not on file  Physical Activity: Not on file  Stress: Not on file  Social Connections: Not on file    Allergies: No Known Allergies  Metabolic Disorder Labs: Lab Results  Component Value Date   HGBA1C 5.8 (H) 07/17/2021   MPG 117 06/15/2020   MPG 128.37 05/24/2020   Lab Results  Component Value Date   PROLACTIN 13.0 04/28/2015   Lab Results  Component Value Date   CHOL 220  (H) 07/17/2021   TRIG 101 07/17/2021   HDL 89 07/17/2021   CHOLHDL 2.5 07/17/2021   VLDL 28 05/24/2020   LDLCALC 114 (H) 07/17/2021   LDLCALC 63 05/24/2020   Lab Results  Component Value Date   TSH 2.110 04/02/2020   TSH 2.040 10/26/2018    Therapeutic Level Labs: No results found for: "LITHIUM" No results found for: "VALPROATE" No results found for: "CBMZ"  Current Medications: Current Outpatient Medications  Medication Sig Dispense Refill   atorvastatin (LIPITOR) 10 MG tablet Take 1 tablet (10 mg total) by mouth daily. 30 tablet 2  clobetasol cream (TEMOVATE) 0.62 % Apply 1 application topically to the affected area(s) 2 (two) times daily. 30 g 0   diclofenac Sodium (VOLTAREN) 1 % GEL Apply 2 g topically 4 (four) times daily. 50 g 2   Eflornithine HCl (VANIQA) 13.9 % cream Apply to face twice daily as needed 45 g 2   ketoconazole (NIZORAL) 2 % shampoo Apply 1 Application topically 2 (two) times a week. 120 mL 0   meloxicam (MOBIC) 7.5 MG tablet Take 1 tablet (7.5 mg total) by mouth 2 (two) times daily as needed for pain. 30 tablet 2   metFORMIN (GLUCOPHAGE) 500 MG tablet Take 1 tablet (500 mg total) by mouth once daily with breakfast. 60 tablet 3   norgestimate-ethinyl estradiol (MILI) 0.25-35 MG-MCG tablet Take 1 tablet by mouth once daily. 28 tablet 11   ondansetron (ZOFRAN) 4 MG tablet Take 1 tablet (4 mg total) by mouth every 4 (four) hours as needed for nausea or vomiting. 8 tablet 0   pantoprazole (PROTONIX) 20 MG tablet Take 1 tablet (20 mg total) by mouth once daily. 30 tablet 1   prazosin (MINIPRESS) 2 MG capsule Take 1 capsule (2 mg total) by mouth once nightly at bedtime. 30 capsule 2   QUEtiapine Fumarate (SEROQUEL XR) 150 MG 24 hr tablet Take 1 tablet (150 mg total) by mouth at bedtime. 30 tablet 30   QUEtiapine (SEROQUEL) 50 MG tablet Take 1 tablet (50 mg total) by mouth once daily. 30 tablet 3   Semaglutide, 2 MG/DOSE, (OZEMPIC, 2 MG/DOSE,) 8 MG/3ML SOPN Inject 2  mg as directed once a week. 3 mL 2   sertraline (ZOLOFT) 50 MG tablet Take 1 tablet (50 mg total) by mouth daily. 30 tablet 3   spironolactone (ALDACTONE) 100 MG tablet take 1 tablet by mouth twice daily 390 tablet 2   valACYclovir (VALTREX) 1000 MG tablet Take 1 tablet (1,000 mg total) by mouth once daily. 60 tablet 2   zolpidem (AMBIEN) 5 MG tablet Take 1 tablet (5 mg total) by mouth at bedtime as needed for sleep. 30 tablet 3   No current facility-administered medications for this visit.     Musculoskeletal: Strength & Muscle Tone:  Unable to assess due to telephone visit Gait & Station:  Unable to assess due to telephone visit Patient leans: N/A  Psychiatric Specialty Exam: Review of Systems  There were no vitals taken for this visit.There is no height or weight on file to calculate BMI.  General Appearance:  Unable to assess due to telephone visit  Eye Contact:   Unable to assess due to telephone visit  Speech:  Clear and Coherent and Normal Rate  Volume:  Normal  Mood:  Euthymic  Affect:  Congruent  Thought Process:  Coherent, Goal Directed and Linear  Orientation:  Full (Time, Place, and Person)  Thought Content: WDL and Logical   Suicidal Thoughts:  No  Homicidal Thoughts:  No  Memory:  Immediate;   Good Recent;   Good Remote;   Good  Judgement:  Good  Insight:  Good  Psychomotor Activity:  Normal  Concentration:  Concentration: Good and Attention Span: Good  Recall:  Good  Fund of Knowledge: Good  Language: Good  Akathisia:   Unable to assess due to telephone visit  Handed:  Right  AIMS (if indicated): Not done  Assets:  Communication Skills Desire for Improvement Housing Leisure Time Social Support  ADL's:  Intact  Cognition: WNL  Sleep:  Poor   Screenings:  AUDIT    Flowsheet Row [facility]  Alcohol Use Disorder Identification Test Final Score (AUDIT) 0      GAD-7     Flowsheet Row Video Visit from 01/31/2022 in [facility] Most recent reading at 01/31/2022  9:47 AM Video Visit from 10/30/2021 in [facility] Most recent reading at 10/30/2021 11:29 AM Office Visit from 07/24/2021 in [facility] Most recent reading at 07/24/2021 10:08 AM Video Visit from 07/24/2021 in [facility] Most recent reading at 07/24/2021  8:47 AM Video Visit from 01/21/2021 in [facility] Most recent reading at 01/21/2021  1:07 PM  Total GAD-7 Score 2 2 0 9 15      PHQ2-9    Flowsheet Row Video Visit from 01/31/2022 in [facility] Most recent reading at 01/31/2022  9:36 AM Video Visit from 10/30/2021 in [facility] Most recent reading at 10/30/2021 11:26 AM Office Visit from 07/24/2021 in [facility] Most recent reading at 07/24/2021 10:07 AM Video Visit from 07/24/2021 in [facility] Most recent reading at 07/24/2021  8:48 AM Video Visit from 01/21/2021 in [facility] Most recent reading at 01/21/2021  1:05 PM  PHQ-2 Total Score 6 2 0 2 6  PHQ-9 Total Score 10 8 8 11 20       Flowsheet Row ED from 05/22/2021 in [facility] Emergency Department at [facility] ED from 04/11/2021 in [facility] Emergency Department at [facility] Video Visit from 01/21/2021 in [facility]  C-SSRS RISK CATEGORY No Risk No Risk No Risk        Assessment and Plan: Patient reports that her sleep, anxiety, and depression are well-managed.  She does note that she is worried about losing her parakeet.  Provider agreeable to write patient a letter for an ESA animal.   Patient informed that Ambien will be tapered or discontinued in the near future.  She endorsed understanding  and agreed. No medication changes made today. Patient has not had labs drawn in over 7 months. Today provider ordered thyroid panel, Unity S, Hgb A1c, LFT, and a CBC.  1. Schizoaffective disorder, bipolar type (HCC)  Continue- lamoTRIgine (LAMICTAL) 100 MG tablet; Take 1 tablet (100 mg total) by mouth daily.  Dispense: 30 tablet; Refill: 3 Continue- QUEtiapine (SEROQUEL XR) 200 MG 24 hr tablet; Take 1 tablet (200 mg total) by mouth at bedtime.  Dispense: 30 tablet; Refill: 3 Continue- QUEtiapine (SEROQUEL) 50 MG tablet; Take 1 tablet (50 mg total) by mouth once daily.  Dispense: 30 tablet; Refill: 3 Continue- sertraline (ZOLOFT) 50 MG tablet; Take 1 tablet (50 mg total) by mouth daily.  Dispense: 30 tablet; Refill: 3  2. PTSD (post-traumatic stress disorder)  Continue- prazosin (MINIPRESS) 2 MG capsule; Take 1 capsule (2 mg total) by mouth once nightly at bedtime.  Dispense: 30 capsule; Refill: 2 Continue- sertraline (ZOLOFT) 50 MG tablet; Take 1 tablet (50 mg total) by mouth daily.  Dispense: 30 tablet; Refill: 3  3. Insomnia due to other mental disorder  Continue- zolpidem (AMBIEN) 5 MG tablet; Take 1 tablet (5 mg total) by mouth at bedtime as needed for sleep.  Dispense: 30 tablet; Refill: 3     Follow up in 3 months Follow-up with therapy  Shanna Cisco, NP 01/31/2022, 12:35 PM

## 2022-02-03 ENCOUNTER — Encounter: Payer: Self-pay | Admitting: Critical Care Medicine

## 2022-02-03 NOTE — Telephone Encounter (Signed)
A letter was produced.  It is on printer , pull the letter and put in my file folder and I will sign tomorrow

## 2022-02-05 ENCOUNTER — Other Ambulatory Visit: Payer: Self-pay

## 2022-02-06 NOTE — Telephone Encounter (Signed)
Called patient and left voicemail.

## 2022-02-10 ENCOUNTER — Telehealth: Payer: Self-pay

## 2022-02-10 NOTE — Telephone Encounter (Signed)
Called patient and she wanted her letter to be mailed to her

## 2022-02-12 ENCOUNTER — Other Ambulatory Visit: Payer: Self-pay

## 2022-02-19 ENCOUNTER — Other Ambulatory Visit (HOSPITAL_COMMUNITY)
Admission: RE | Admit: 2022-02-19 | Discharge: 2022-02-19 | Disposition: A | Discharge status: Home / Self Care | Payer: Medicaid Other | Source: Ambulatory Visit | Attending: Obstetrics and Gynecology | Admitting: Obstetrics and Gynecology

## 2022-02-19 ENCOUNTER — Ambulatory Visit (INDEPENDENT_AMBULATORY_CARE_PROVIDER_SITE_OTHER): Payer: Medicare Other | Admitting: Obstetrics and Gynecology

## 2022-02-19 ENCOUNTER — Encounter: Payer: Self-pay | Admitting: Obstetrics and Gynecology

## 2022-02-19 VITALS — BP 123/82 | HR 86 | Wt 163.0 lb

## 2022-02-19 DIAGNOSIS — Z8742 Personal history of other diseases of the female genital tract: Secondary | ICD-10-CM

## 2022-02-19 DIAGNOSIS — N898 Other specified noninflammatory disorders of vagina: Secondary | ICD-10-CM

## 2022-02-19 NOTE — Progress Notes (Signed)
GYNECOLOGY VISIT  Patient name: Sarah Evans MRN [id number]  Date of birth: January 04, 1981 Chief Complaint:   Follow-up   History:  Sarah Evans is a 42 y.o. G1P0100 being seen today for PCOS follow up.  Lost 50#, using ozempic, and off birth control x 1 year. Not currently sexually active, having regular cycles and not interested in starting contraception. Interested in having [place] as she has not had one and was diagnosed clinically.   Past Medical History:  Diagnosis Date   Bipolar 1 disorder ([place])    Herpes simplex virus (HSV) infection    dx 2001   Polycystic ovary disease    PTSD (post-traumatic stress disorder)    Schizophrenia ([place])    Stroke [facility])     Past Surgical History:  Procedure Laterality Date   BUBBLE STUDY  05/25/2020   Procedure: BUBBLE STUDY;  Surgeon: Pixie Casino, MD;  Location: [facility];  Service: Cardiovascular;;   NO PAST SURGERIES     TEE WITHOUT CARDIOVERSION N/A 05/25/2020   Procedure: TRANSESOPHAGEAL ECHOCARDIOGRAM (TEE);  Surgeon: Pixie Casino, MD;  Location: [facility] ENDOSCOPY;  Service: Cardiovascular;  Laterality: N/A;    The following portions of the patient's history were reviewed and updated as appropriate: allergies, current medications, past family history, past medical history, past social history, past surgical history and problem list.   Health Maintenance:   Last pap     Component Value Date/Time   DIAGPAP  05/30/2021 1616    - Negative for intraepithelial lesion or malignancy (NILM)   [place] Negative 05/30/2021 1616   ADEQPAP  05/30/2021 1616    Satisfactory for evaluation; transformation zone component PRESENT.   Last mammogram: n/a   Review of Systems:  Pertinent items are noted in HPI. Comprehensive review of systems was otherwise negative.   Objective:  Physical Exam BP 123/82   Pulse 86   Wt 163 lb (73.9 kg)   LMP 02/03/2022 (Exact Date)   BMI 25.53 kg/m    Physical Exam Vitals and nursing note reviewed.   Constitutional:      Appearance: Normal appearance.  HENT:     Head: Normocephalic and atraumatic.  Pulmonary:     Effort: Pulmonary effort is normal.  Neurological:     General: No focal deficit present.     Mental Status: She is alert.  Psychiatric:        Mood and Affect: Mood normal.        Behavior: Behavior normal.        Thought Content: Thought content normal.        Judgment: Judgment normal.        Assessment & Plan:   1. History of PCOS Doing well without medication on board and having regular menses at this time. Reviewed that menses should remain regular unless there are changes. Will continue with her current weight management plan. Pelvic US ordered for baseline and evaluate ovaries per request. Noted that PCOS is primarily a clinical diagnosis and she is currently managing her symptoms with weight management. Reviewed that when perimenopausal transition onsets, there may be changes in her menses at that time due to the nature of the hormonal changes that occur during that time.  - US PELVIC COMPLETE WITH TRANSVAGINAL; Future  2. Vaginal odor Self swab and urine collected in light of new onset vaginal odor and urinary frequency. Denies vaginal itching.  - Cervicovaginal ancillary only - Urine Culture  Routine preventative health maintenance measures emphasized.  Darliss Cheney, MD [facility] [place]

## 2022-02-20 LAB — CERVICOVAGINAL ANCILLARY ONLY
Bacterial Vaginitis (gardnerella): NEGATIVE
Candida Glabrata: NEGATIVE
Candida Vaginitis: NEGATIVE
Comment: NEGATIVE
Comment: NEGATIVE
Comment: NEGATIVE

## 2022-02-22 LAB — URINE CULTURE

## 2022-02-24 ENCOUNTER — Telehealth: Payer: Self-pay | Admitting: Family Medicine

## 2022-02-24 NOTE — Telephone Encounter (Signed)
Patient called in saying she is still having the same odor she's been having even though all of her test results came back negative. She is wanting to know if some antibiotics can be sent in for her.

## 2022-02-25 NOTE — Telephone Encounter (Signed)
Left message for pt that we are returning her call if she continues to have questions to please give the office a call.   Re:  Per Dr. Currie Paris pt can use Boric Acid once a day at bedtime for 7 days, or use a douche with water only.

## 2022-02-26 ENCOUNTER — Ambulatory Visit
Admission: RE | Admit: 2022-02-26 | Discharge: 2022-02-26 | Disposition: A | Discharge status: Home / Self Care | Payer: Medicaid Other | Source: Ambulatory Visit | Attending: Obstetrics and Gynecology | Admitting: Obstetrics and Gynecology

## 2022-02-26 DIAGNOSIS — Z8742 Personal history of other diseases of the female genital tract: Secondary | ICD-10-CM | POA: Diagnosis not present

## 2022-02-27 ENCOUNTER — Telehealth: Payer: Self-pay | Admitting: Family Medicine

## 2022-02-27 NOTE — Telephone Encounter (Signed)
Patient calling for test results. °

## 2022-02-28 ENCOUNTER — Other Ambulatory Visit: Payer: Self-pay

## 2022-02-28 ENCOUNTER — Other Ambulatory Visit (HOSPITAL_COMMUNITY): Payer: Self-pay

## 2022-02-28 NOTE — Telephone Encounter (Signed)
Called pt; VM left. Ultrasound results and provider comments reviewed by patient.

## 2022-03-03 ENCOUNTER — Telehealth: Payer: Self-pay | Admitting: Family Medicine

## 2022-03-03 ENCOUNTER — Other Ambulatory Visit: Payer: Self-pay

## 2022-03-03 NOTE — Telephone Encounter (Signed)
Patient would like a call back about medications

## 2022-03-05 NOTE — Telephone Encounter (Signed)
Returned patients call. Patient reports she wanted to know what the recommend for her Feminine Odor that has been occurring for about 6 months. She has been using the Boric Acid 600 mg Suppositories (OTC). She was told to use once and then as needed, she used them 3 times, once every 12 hours. Her last swab on 2/14 was negative for infection.   She denies using any scented soaps or feminine sprays. She reports the discharge smells fishy. She reports she has not had this prior to the last 6 months. She has been having monthly cycles. She does not have discharge. She showers regularly. She is not using a sponge to bathe and fresh towels. She is abstinent and has been since August 2021.  Patient would like a recommendation of what to do in regards to her Feminine odor and would like to know if US shows she still has PCOS. Will route information to Dr. Currie Paris for advisement.

## 2022-03-06 NOTE — Telephone Encounter (Signed)
Per Dr. Currie Paris:   Sarah Cheney, MD  Sarah Pierini, RN Caller: Unspecified (3 days ago,  4:02 PM) Can trial boric acid nightly for 2 weeks to see if that resolves odor. If no resolution, can return for examination.  Ultrasound normal but technically PCOS is a lifelong diagnosis that is managed. The important thing is managing cycles and any associated issues such as weight, blood pressure, cholesterol and blood sugar levels.  Called patient, she did not answer. LM for her to call the office at her convenience and to check her My Chart message for follow up information.

## 2022-04-02 ENCOUNTER — Other Ambulatory Visit: Payer: Self-pay

## 2022-04-02 ENCOUNTER — Other Ambulatory Visit: Payer: Self-pay | Admitting: Critical Care Medicine

## 2022-04-02 MED ORDER — OZEMPIC (2 MG/DOSE) 8 MG/3ML ~~LOC~~ SOPN
2.0000 mg | PEN_INJECTOR | SUBCUTANEOUS | 0 refills | Status: DC
  Filled 2022-04-02: qty 3, 28d supply, fill #0

## 2022-04-23 ENCOUNTER — Ambulatory Visit: Payer: Self-pay

## 2022-04-23 NOTE — Telephone Encounter (Signed)
  Chief Complaint: Hemorrhoids  Symptoms: Hemorrhoids comes and goes, when present had rectal bleeding and discomfort.  Frequency: on and off since 2017 when had baby  Pertinent Negatives: Patient denies rectal bleeding or pain now Disposition: ED /[] Urgent Care (no appt availability in office) / Appointment(In office/virtual)/  [facility]/ Home Care/ Refused Recommended Disposition /[] [place] [facility]/  Follow-up with PCP Additional Notes: pt states she has ongoing issue with hemorrhoids and wanting them looked at or possible removed. Pt had been prescribed cream when she had her daughter but unable to find cream and would be expired by now. Pt asking if cream can be prescribed. Wanted to schedule appt to discuss GI referral as well. Appt scheduled for 05/08/22 at 1510 with Marylene Land, [place]. Care advice given and pt verbalized understanding. No other questions/concerns noted.    Summary: hemorrhoid issues   Patient, has hemorrhoids that can be painful at times and bleed, but she doesnt have the pain now and the bleeding was yeserday,  Please call back to discuss         Reason for Disposition  Mild rectal pain  Answer Assessment - Initial Assessment Questions 1. SYMPTOM:  "What's the main symptom you're concerned about?" (e.g., pain, itching, swelling, rash)     Hemorrhoids 2. ONSET: "When did the sx  start?"     2017, on and off  3. RECTAL PAIN: "Do you have any pain around your rectum?" "How bad is the pain?"  (Scale 0-10; or mild, moderate, severe)   - NONE (0): no pain   - MILD (1-3): doesn't interfere with normal activities    - MODERATE (4-7): interferes with normal activities or awakens from sleep, limping    - SEVERE (8-10): excruciating pain, unable to have a bowel movement      0 4. RECTAL ITCHING: "Do you have any itching in this area?" "How bad is the itching?"  (Scale 0-10; or mild, moderate, severe)   - NONE: no itching   - MILD: doesn't  interfere with normal activities    - MODERATE-SEVERE: interferes with normal activities or awakens from sleep     Sometimes  5. CONSTIPATION: "Do you have constipation?" If Yes, ask: "How bad is it?"     no 6. CAUSE: "What do you think is causing the anus symptoms?"     Hemorrhoids 7. OTHER SYMPTOMS: "Do you have any other symptoms?"  (e.g., abdomen pain, fever, rectal bleeding, vomiting)     Rectal bleeding  Protocols used: Rectal Symptoms-A-AH

## 2022-04-24 NOTE — Telephone Encounter (Addendum)
Call placed to advised pt that she may use OTC hemorrhoid cream. Patient has an appointment with provider on 05/08/2022. Unable to contact message left to call us back.

## 2022-05-02 ENCOUNTER — Other Ambulatory Visit: Payer: Self-pay

## 2022-05-02 ENCOUNTER — Other Ambulatory Visit: Payer: Self-pay | Admitting: Critical Care Medicine

## 2022-05-02 MED ORDER — OZEMPIC (2 MG/DOSE) 8 MG/3ML ~~LOC~~ SOPN
2.0000 mg | PEN_INJECTOR | SUBCUTANEOUS | 0 refills | Status: DC
  Filled 2022-05-02: qty 3, 28d supply, fill #0

## 2022-05-05 ENCOUNTER — Other Ambulatory Visit: Payer: Self-pay | Admitting: Critical Care Medicine

## 2022-05-05 ENCOUNTER — Other Ambulatory Visit: Payer: Self-pay

## 2022-05-06 ENCOUNTER — Other Ambulatory Visit: Payer: Self-pay | Admitting: Critical Care Medicine

## 2022-05-06 NOTE — Telephone Encounter (Signed)
Medication Refill - Medication: ketoconazole (NIZORAL) 2 % shampoo   The pharmacy requested a medication transfer.   Has the patient contacted their pharmacy? Yes.    (Agent: If yes, when and what did the pharmacy advise?)  Preferred Pharmacy (with phone number or street name):  [id number] [place], [place] - [address] AT [facility]  [address] [place] [address]  Phone: [phone] Fax: [phone]  Hours: Not open 24 hours   Has the patient been seen for an appointment in the last year OR does the patient have an upcoming appointment? Yes.    Agent: Please be advised that RX refills may take up to 3 business days. We ask that you follow-up with your pharmacy.

## 2022-05-06 NOTE — Telephone Encounter (Signed)
Requested medication (s) are due for refill today: yes  Requested medication (s) are on the active medication list: yes  Last refill:  09/12/21  Future visit scheduled: no  Notes to clinic:  Unable to refill per protocol, cannot delegate.      Requested Prescriptions  Pending Prescriptions Disp Refills   ketoconazole (NIZORAL) 2 % shampoo 120 mL 0    Sig: Apply 1 Application topically 2 (two) times a week.     Not Delegated - Over the Counter: OTC 2 Failed - 05/05/2022 11:44 AM      Failed - This refill cannot be delegated      Passed - Valid encounter within last 12 months    Recent Outpatient Visits           9 months ago Chronic pain of both knees   [place] [facility] Storm Frisk, MD   10 months ago Prediabetes   [place] [facility] Storm Frisk, MD   1 year ago Obesity (BMI 30-39.9)   [place] [facility] [place], Shea Stakes, NP   1 year ago History of ischemic stroke   [facility] Storm Frisk, MD   1 year ago Chronic thoracic back pain, unspecified back pain laterality   [place] [facility] May[place], Kasandra Knudsen, PA-C       Future Appointments             In 1 week Sharon Seller, Marzella Schlein, PA-C [facility]

## 2022-05-07 ENCOUNTER — Other Ambulatory Visit: Payer: Self-pay

## 2022-05-07 MED ORDER — KETOCONAZOLE 2 % EX SHAM: 1.0000 | MEDICATED_SHAMPOO | CUTANEOUS | 0 refills | Status: DC

## 2022-05-07 NOTE — Telephone Encounter (Signed)
Requested medication (s) are due for refill today - yes  Requested medication (s) are on the active medication list -yes  Future visit scheduled -yes  Last refill: 09/12/21  Notes to clinic: non delegated Rx  Requested Prescriptions  Pending Prescriptions Disp Refills   ketoconazole (NIZORAL) 2 % shampoo 120 mL 0    Sig: Apply 1 Application topically 2 (two) times a week.     Not Delegated - Over the Counter: OTC 2 Failed - 05/06/2022  4:08 PM      Failed - This refill cannot be delegated      Passed - Valid encounter within last 12 months    Recent Outpatient Visits           9 months ago Chronic pain of both knees   [place] [facility] Storm Frisk, MD   10 months ago Prediabetes   [facility] Storm Frisk, MD   1 year ago Obesity (BMI 30-39.9)   [place] [facility] [place], Shea Stakes, NP   1 year ago History of ischemic stroke   [facility] Storm Frisk, MD   1 year ago Chronic thoracic back pain, unspecified back pain laterality   [place] [facility] Mayers, Kasandra Knudsen, PA-C       Future Appointments             In 1 week Anders Simmonds, [place] [facility]               Requested Prescriptions  Pending Prescriptions Disp Refills   ketoconazole (NIZORAL) 2 % shampoo 120 mL 0    Sig: Apply 1 Application topically 2 (two) times a week.     Not Delegated - Over the Counter: OTC 2 Failed - 05/06/2022  4:08 PM      Failed - This refill cannot be delegated      Passed - Valid encounter within last 12 months    Recent Outpatient Visits           9 months ago Chronic pain of both knees   [place] [facility] Storm Frisk, MD   10 months ago Prediabetes   [facility] Storm Frisk, MD   1 year  ago Obesity (BMI 30-39.9)   [place] [facility] [place], Shea Stakes, NP   1 year ago History of ischemic stroke   [facility] Storm Frisk, MD   1 year ago Chronic thoracic back pain, unspecified back pain laterality    [facility] May[place], Kasandra Knudsen, PA-C       Future Appointments             In 1 week Sharon Seller, Marzella Schlein, PA-C [facility]

## 2022-05-08 ENCOUNTER — Ambulatory Visit: Payer: Medicare Other | Admitting: Physician Assistant

## 2022-05-14 DIAGNOSIS — K648 Other hemorrhoids: Secondary | ICD-10-CM | POA: Diagnosis not present

## 2022-05-14 DIAGNOSIS — K5900 Constipation, unspecified: Secondary | ICD-10-CM | POA: Diagnosis not present

## 2022-05-16 ENCOUNTER — Ambulatory Visit: Payer: Self-pay | Admitting: *Deleted

## 2022-05-16 NOTE — Telephone Encounter (Signed)
Routing to PCP for review.

## 2022-05-16 NOTE — Telephone Encounter (Signed)
  Chief Complaint: Patient request increased dosing of Semaglutide Symptoms: no recent weight loss   Disposition: [] ED /[] Urgent Care (no appt availability in office) / [] Appointment(In office/virtual)/ []  [place] [facility]/ [] Home Care/ [] Refused Recommended Disposition /[] [place] [facility]/ [x]  Follow-up with PCP Additional Notes: Patient is requesting increased dosing of Ozempic - patient reports she has only lost 1lb in 3 months  Reason for Disposition  [1] Caller has NON-URGENT medicine question about med that PCP prescribed AND [2] triager unable to answer question  Answer Assessment - Initial Assessment Questions 1. NAME of MEDICINE: "What medicine(s) are you calling about?"     Ozempic  2mg  once/week  2. QUESTION: "What is your question?" (e.g., double dose of medicine, side effect)     Patient would like to increase her dosing 3. PRESCRIBER: "Who prescribed the medicine?" Reason: if prescribed by specialist, call should be referred to that group.     PCP 4. SYMPTOMS: "Do you have any symptoms?" If Yes, ask: "What symptoms are you having?"  "How bad are the symptoms (e.g., mild, moderate, severe)     Weight 162 lbs- patient reports she has not lost any weight recently- 3 months ago 163lbs Next appointment August 12, 2022  Protocols used: Medication Question Call-A-AH

## 2022-05-16 NOTE — Telephone Encounter (Signed)
Summary: Request increase of dosage of Ozempic   The patient called in requesting an increase in dosage of her Semaglutide, 2 MG/DOSE, (OZEMPIC, 2 MG/DOSE,) 8 MG/3ML SOPN. She states she has only lost 1 pound and she feels like an increase in dosage may help more at this point as she has been taking this strength for about a year now. Please assist patient further. She uses  [facility] Phone: [phone] Fax: [phone]     Patient request call back at 3:00- she is on another line

## 2022-05-17 NOTE — Telephone Encounter (Signed)
This is the maximum dose recommeded

## 2022-05-19 ENCOUNTER — Ambulatory Visit: Payer: Medicare Other | Admitting: Physician Assistant

## 2022-05-20 NOTE — Telephone Encounter (Signed)
Called patient and left voicemail.

## 2022-05-21 ENCOUNTER — Other Ambulatory Visit: Payer: Self-pay | Admitting: Critical Care Medicine

## 2022-05-21 MED ORDER — OZEMPIC (2 MG/DOSE) 8 MG/3ML ~~LOC~~ SOPN
2.0000 mg | PEN_INJECTOR | SUBCUTANEOUS | 0 refills | Status: DC
  Filled 2022-05-28: qty 3, 28d supply, fill #0

## 2022-05-22 ENCOUNTER — Other Ambulatory Visit: Payer: Self-pay

## 2022-05-28 ENCOUNTER — Other Ambulatory Visit: Payer: Self-pay

## 2022-05-29 ENCOUNTER — Other Ambulatory Visit: Payer: Self-pay

## 2022-05-30 ENCOUNTER — Ambulatory Visit (INDEPENDENT_AMBULATORY_CARE_PROVIDER_SITE_OTHER): Payer: Medicare PPO | Admitting: Orthopedic Surgery

## 2022-05-30 ENCOUNTER — Other Ambulatory Visit (INDEPENDENT_AMBULATORY_CARE_PROVIDER_SITE_OTHER): Payer: Medicare PPO

## 2022-05-30 ENCOUNTER — Encounter: Payer: Self-pay | Admitting: Orthopedic Surgery

## 2022-05-30 ENCOUNTER — Other Ambulatory Visit: Payer: Self-pay

## 2022-05-30 VITALS — BP 111/72 | Ht 67.0 in | Wt 160.0 lb

## 2022-05-30 DIAGNOSIS — M545 Low back pain, unspecified: Secondary | ICD-10-CM

## 2022-05-30 DIAGNOSIS — M542 Cervicalgia: Secondary | ICD-10-CM | POA: Diagnosis not present

## 2022-05-30 DIAGNOSIS — G8929 Other chronic pain: Secondary | ICD-10-CM

## 2022-05-30 NOTE — Progress Notes (Signed)
Orthopedic Spine Surgery Office Note  Assessment: Patient is a 42 y.o. female with chronic neck and low back pain.  No radicular symptoms   Plan: -Explained that initially conservative treatment is tried as a significant number of patients may experience relief with these treatment modalities. Discussed that the conservative treatments include:  -activity modification  -physical therapy  -over the counter pain medications  -medrol dosepak  -steroid injections -Patient has tried Tylenol, NSAIDs -Recommended PT and continue with Tylenol.  Referral provided for physical therapy -If she is not doing any better in 6 to 8 weeks time, then would order an MRI of her most symptomatic area for further workup -Patient should return to office on an as-needed basis   Patient expressed understanding of the plan and all questions were answered to the patient's satisfaction.   ___________________________________________________________________________   History:  Patient is a 42 y.o. female who presents today for cervical and lumbar spine.  Patient states that she has had 20 years of back pain.  She states she feels it all the way from her neck down to her lower back.  It does not radiate into either upper extremity or lower extremity.  Pain is worse with activity and improves with rest.  There is no trauma or injury that preceded the onset of pain.  She has not had significant relief with over-the-counter medications.  Has not tried any other treatment so far.   Weakness: Yes, feels weak in her back.  No other weakness noted Difficulty with fine motor skills (e.g., buttoning shirts, handwriting): Denies Symptoms of imbalance: Denies Paresthesias and numbness: Denies Bowel or bladder incontinence: Denies Saddle anesthesia: Denies  Treatments tried: Tylenol, NSAIDs  Review of systems: Denies fevers and chills, night sweats, unexplained weight loss, history of cancer, pain that wakes them at  night  Past medical history: Hyperlipidemia Hypertension History of stroke Chronic pain Bipolar disorder Schizophrenia  Allergies: NKDA  Past surgical history:  None  Social history: Denies use of nicotine product (smoking, vaping, patches, smokeless) Alcohol use: Denies Denies recreational drug use  Physical Exam:  General: no acute distress, appears stated age Neurologic: alert, answering questions appropriately, following commands Respiratory: unlabored breathing on room air, symmetric chest rise Psychiatric: appropriate affect, normal cadence to speech   MSK (spine):  -Strength exam      Left  Right Grip strength                5/5  5/5 Interosseus   5/5   5/5 Wrist extension  5/5  5/5 Wrist flexion   5/5  5/5 Elbow flexion   5/5  5/5 Deltoid    5/5  5/5  EHL    5/5  5/5 TA    5/5  5/5 GSC    5/5  5/5 Knee extension  5/5  5/5 Hip flexion   5/5  5/5  -Sensory exam    Sensation intact to light touch in L3-S1 nerve distributions of bilateral lower extremities  Sensation intact to light touch in C5-T1 nerve distributions of bilateral upper extremities  -Brachioradialis DTR: 2/4 on the left, 2/4 on the right -Biceps DTR: 2/4 on the left, 2/4 on the right -Achilles DTR: 2/4 on the left, 2/4 on the right -Patellar tendon DTR: 2/4 on the left, 2/4 on the right  -Spurling: Negative bilaterally -Hoffman sign: Negative bilaterally -Clonus: No beats bilaterally -Interosseous wasting: None seen -Grip and release test: Negative -Romberg: Negative -Gait: Normal, able to walk in high heels without unsteadiness  Bilateral shoulder exam: No pain through range of motion, negative Jobe, negative drop arm sign, negative belly press, no weakness with external rotation with arm at side Bilateral hip exam: No pain to range of motion, negative Stinchfield, negative FABER  Tinel's at wrist: Negative bilaterally Phalen's at wrist: Negative bilaterally Durkan's: Negative  bilaterally  Tinel's at elbow: Negative bilaterally  Imaging: XR of the cervical spine from 05/30/2022 was independently reviewed and interpreted, showing no significant degenerative changes.  No fracture or dislocation seen.  No evidence of instability on flexion/extension views.  XR of the lumbar spine from 05/30/2022 was independently reviewed and interpreted, showing no significant degenerative changes.  No fracture or dislocation seen.  No evidence of instability on flexion/extension views.   Patient name: Sarah Evans Patient MRN: [id number] Date of visit: 05/30/22

## 2022-06-02 ENCOUNTER — Other Ambulatory Visit: Payer: Self-pay

## 2022-06-03 ENCOUNTER — Other Ambulatory Visit: Payer: Self-pay

## 2022-06-04 DIAGNOSIS — K625 Hemorrhage of anus and rectum: Secondary | ICD-10-CM | POA: Diagnosis not present

## 2022-06-04 DIAGNOSIS — K5909 Other constipation: Secondary | ICD-10-CM | POA: Diagnosis not present

## 2022-06-04 DIAGNOSIS — K642 Third degree hemorrhoids: Secondary | ICD-10-CM | POA: Diagnosis not present

## 2022-06-11 ENCOUNTER — Telehealth (HOSPITAL_COMMUNITY): Payer: Self-pay | Admitting: *Deleted

## 2022-06-11 NOTE — Telephone Encounter (Signed)
Patient called asking for medication changes. Has stopped her Seroquel but would like to be on Depakote. No appointment scheduled, transferred to front desk to make an appointment. Message sent to MD for review before appointment.

## 2022-06-13 ENCOUNTER — Other Ambulatory Visit (HOSPITAL_COMMUNITY): Payer: Self-pay | Admitting: Psychiatry

## 2022-06-13 DIAGNOSIS — F250 Schizoaffective disorder, bipolar type: Secondary | ICD-10-CM

## 2022-06-13 MED ORDER — DIVALPROEX SODIUM ER 500 MG PO TB24
500.0000 mg | ORAL_TABLET | Freq: Every day | ORAL | 3 refills | Status: DC
Start: 2022-06-13 — End: 2022-07-08

## 2022-06-13 NOTE — Telephone Encounter (Signed)
Provider spoke to patient and was informed that she continues to take her Seroquel.  She finds it effective in helping manage her sleep.  She however reports that she continues to be increasingly irritable and has fluctuations in mood.  At times she reports having racing thoughts.  She notes that she sees shadows.Today Depakote 500 mg started daily.  She will continue her other medications as prescribed.

## 2022-06-15 ENCOUNTER — Emergency Department (HOSPITAL_COMMUNITY)
Admission: EM | Admit: 2022-06-15 | Discharge: 2022-06-16 | Disposition: A | Discharge status: Home / Self Care | Payer: Medicare PPO | Attending: Emergency Medicine | Admitting: Emergency Medicine

## 2022-06-15 ENCOUNTER — Other Ambulatory Visit: Payer: Self-pay

## 2022-06-15 DIAGNOSIS — Z79899 Other long term (current) drug therapy: Secondary | ICD-10-CM | POA: Insufficient documentation

## 2022-06-15 DIAGNOSIS — R1013 Epigastric pain: Secondary | ICD-10-CM

## 2022-06-15 DIAGNOSIS — K8020 Calculus of gallbladder without cholecystitis without obstruction: Secondary | ICD-10-CM | POA: Insufficient documentation

## 2022-06-15 DIAGNOSIS — Z794 Long term (current) use of insulin: Secondary | ICD-10-CM | POA: Insufficient documentation

## 2022-06-15 DIAGNOSIS — R1011 Right upper quadrant pain: Secondary | ICD-10-CM | POA: Diagnosis not present

## 2022-06-15 NOTE — ED Triage Notes (Signed)
Patient coming to ED for evaluation of abdominal pain.  Reports "I woke up this morning and my liver hurts."  States she recently had a change in medications and "one of the side effects is liver damage."  Reports taking two days of new medication.  No reports of nausea or vomiting.  No changes in eye or skin color.

## 2022-06-16 ENCOUNTER — Other Ambulatory Visit: Payer: Self-pay

## 2022-06-16 ENCOUNTER — Encounter (HOSPITAL_COMMUNITY): Payer: Self-pay

## 2022-06-16 ENCOUNTER — Emergency Department (HOSPITAL_COMMUNITY): Payer: Medicare PPO

## 2022-06-16 DIAGNOSIS — R1011 Right upper quadrant pain: Secondary | ICD-10-CM | POA: Diagnosis not present

## 2022-06-16 DIAGNOSIS — K8020 Calculus of gallbladder without cholecystitis without obstruction: Secondary | ICD-10-CM | POA: Diagnosis not present

## 2022-06-16 LAB — COMPREHENSIVE METABOLIC PANEL
ALT: 18 U/L (ref 0–44)
AST: 17 U/L (ref 15–41)
Albumin: 3.5 g/dL (ref 3.5–5.0)
Alkaline Phosphatase: 57 U/L (ref 38–126)
Anion gap: 9 (ref 5–15)
BUN: 8 mg/dL (ref 6–20)
CO2: 22 mmol/L (ref 22–32)
Calcium: 8.5 mg/dL — ABNORMAL LOW (ref 8.9–10.3)
Chloride: 105 mmol/L (ref 98–111)
Creatinine, Ser: 0.9 mg/dL (ref 0.44–1.00)
GFR, Estimated: >60 mL/min (ref >60)
Glucose, Bld: 93 mg/dL (ref 70–99)
Potassium: 3.8 mmol/L (ref 3.5–5.1)
Sodium: 136 mmol/L (ref 135–145)
Total Bilirubin: 0.3 mg/dL (ref 0.3–1.2)
Total Protein: 6.4 g/dL — ABNORMAL LOW (ref 6.5–8.1)

## 2022-06-16 LAB — URINALYSIS, ROUTINE W REFLEX MICROSCOPIC
Bilirubin Urine: NEGATIVE
Glucose, UA: NEGATIVE mg/dL
Hgb urine dipstick: NEGATIVE
Ketones, ur: 5 mg/dL — AB
Leukocytes,Ua: NEGATIVE
Nitrite: NEGATIVE
Protein, ur: NEGATIVE mg/dL
Specific Gravity, Urine: 1.034 — ABNORMAL HIGH (ref 1.005–1.030)
pH: 6 (ref 5.0–8.0)

## 2022-06-16 LAB — CBC
HCT: 39.1 % (ref 36.0–46.0)
Hemoglobin: 12.3 g/dL (ref 12.0–15.0)
MCH: 27.3 pg (ref 26.0–34.0)
MCHC: 31.5 g/dL (ref 30.0–36.0)
MCV: 86.9 fL (ref 80.0–100.0)
Platelets: 346 10*3/uL (ref 150–400)
RBC: 4.5 MIL/uL (ref 3.87–5.11)
RDW: 13.7 % (ref 11.5–15.5)
WBC: 5.8 10*3/uL (ref 4.0–10.5)
nRBC: 0 % (ref 0.0–0.2)

## 2022-06-16 LAB — ACETAMINOPHEN LEVEL: Acetaminophen (Tylenol), Serum: 13 ug/mL (ref 10–30)

## 2022-06-16 LAB — I-STAT BETA HCG BLOOD, ED (MC, WL, AP ONLY): I-stat hCG, quantitative: <5 m[IU]/mL (ref <5)

## 2022-06-16 LAB — LIPASE, BLOOD: Lipase: 41 U/L (ref 11–51)

## 2022-06-16 MED ORDER — OMEPRAZOLE 20 MG PO CPDR
20.0000 mg | DELAYED_RELEASE_CAPSULE | Freq: Every day | ORAL | 0 refills | Status: DC
  Filled 2022-06-16: qty 30, 30d supply, fill #0

## 2022-06-16 MED ORDER — SODIUM CHLORIDE 0.9 % IV BOLUS
1000.0000 mL | Freq: Once | INTRAVENOUS | Status: AC
  Administered 2022-06-16: 1000 mL via INTRAVENOUS

## 2022-06-16 MED ORDER — HYDROMORPHONE HCL 1 MG/ML IJ SOLN
1.0000 mg | Freq: Once | INTRAMUSCULAR | Status: AC
  Administered 2022-06-16: 1 mg via INTRAVENOUS
  Filled 2022-06-16: qty 1

## 2022-06-16 MED ORDER — DICYCLOMINE HCL 20 MG PO TABS
20.0000 mg | ORAL_TABLET | Freq: Two times a day (BID) | ORAL | 0 refills | Status: DC | PRN
  Filled 2022-06-16: qty 20, 10d supply, fill #0

## 2022-06-16 MED ORDER — ONDANSETRON HCL 4 MG/2ML IJ SOLN
4.0000 mg | Freq: Once | INTRAMUSCULAR | Status: AC
  Administered 2022-06-16: 4 mg via INTRAVENOUS
  Filled 2022-06-16: qty 2

## 2022-06-16 NOTE — Discharge Instructions (Signed)
Your imaging shows that you have gallstones, possibly could be cause of your pain, given you information on foods that can help decrease these pains.  I also given you an acid pill as well as Bentyl which is used for stomach spasms please take as prescribed.  Follow-up with general surgery for further evaluation and/or your primary care provider.  Come back to the emergency department if you develop chest pain, shortness of breath, severe abdominal pain, uncontrolled nausea, vomiting, diarrhea.

## 2022-06-16 NOTE — ED Provider Notes (Signed)
[place] EMERGENCY DEPARTMENT AT [facility] Provider Note   CSN: [id number] Arrival date & time: 06/15/22  2329     History  Chief Complaint  Patient presents with   Abdominal Pain    Sarah Evans is a 42 y.o. female.  HPI   Patient with medical history including bipolar, polycystic ovarian disease, schizophrenia, presenting with complaints of epigastric pain.  Patient states this started earlier yesterday, came on gradually, has remained constant, describes as a pressure-like sensation, worse after drinking, no associated nausea or vomiting, still passing gas having normal bowel movements.  Patient is concerned that this might be coming from her new medication, states that she was started on Depakote.  She does note that she has been taking Tylenol to help with some of her pain, she has no abdominal surgeries, denies excessive alcohol use or NSAID use, she has no other complaints.  No associated fevers chills cough congestion no urinary symptoms.  She denies any shortness of breath or chest pain.    Home Medications Prior to Admission medications   Medication Sig Start Date End Date Taking? Authorizing Provider  dicyclomine (BENTYL) 20 MG tablet Take 1 tablet (20 mg total) by mouth 2 (two) times daily as needed for spasms. 06/16/22  Yes Carroll Sage, PA-C  divalproex (DEPAKOTE ER) 500 MG 24 hr tablet Take 1 tablet (500 mg total) by mouth daily. 06/13/22   Shanna Cisco, NP  omeprazole (PRILOSEC) 20 MG capsule Take 1 capsule (20 mg total) by mouth daily. 06/16/22 07/16/22 Yes Carroll Sage, PA-C  atorvastatin (LIPITOR) 10 MG tablet Take 1 tablet (10 mg total) by mouth daily. 07/24/21   Storm Frisk, MD  clobetasol cream (TEMOVATE) 0.05 % Apply 1 application topically to the affected area(s) 2 (two) times daily. 06/20/21   Storm Frisk, MD  diclofenac Sodium (VOLTAREN) 1 % GEL Apply 2 g topically 4 (four) times daily. 07/24/21   Storm Frisk, MD   Eflornithine HCl [facility]) 13.9 % cream Apply to face twice daily as needed Patient not taking: Reported on 02/19/2022 07/24/21   Storm Frisk, MD  ketoconazole (NIZORAL) 2 % shampoo Apply 1 Application topically 2 (two) times a week. 05/08/22   Storm Frisk, MD  meloxicam (MOBIC) 7.5 MG tablet Take 1 tablet (7.5 mg total) by mouth 2 (two) times daily as needed for pain. 08/07/21   Tarry Kos, MD  metFORMIN (GLUCOPHAGE) 500 MG tablet Take 1 tablet (500 mg total) by mouth once daily with breakfast. 06/20/21   Storm Frisk, MD  norgestimate-ethinyl estradiol (MILI) 0.25-35 MG-MCG tablet Take 1 tablet by mouth once daily. Patient not taking: Reported on 02/19/2022 07/24/21   Storm Frisk, MD  ondansetron [facility]) 4 MG tablet Take 1 tablet (4 mg total) by mouth every 4 (four) hours as needed for nausea or vomiting. 07/18/21   Storm Frisk, MD  pantoprazole (PROTONIX) 20 MG tablet Take 1 tablet (20 mg total) by mouth once daily. 07/24/21   Storm Frisk, MD  prazosin (MINIPRESS) 2 MG capsule Take 1 capsule (2 mg total) by mouth once nightly at bedtime. 01/31/22   Shanna Cisco, NP  QUEtiapine (SEROQUEL) 50 MG tablet Take 1 tablet (50 mg total) by mouth once daily. 01/31/22   Shanna Cisco, NP  QUEtiapine Fumarate (SEROQUEL XR) 150 MG 24 hr tablet Take 1 tablet (150 mg total) by mouth at bedtime. 01/31/22   Shanna Cisco, NP  Semaglutide, 2 MG/DOSE, (OZEMPIC, 2 MG/DOSE,) 8 MG/3ML SOPN Inject 2 mg as directed once a week. Must have office visit for refills 05/21/22   Storm Frisk, MD  sertraline (ZOLOFT) 50 MG tablet Take 1 tablet (50 mg total) by mouth daily. 01/31/22   Shanna Cisco, NP  spironolactone (ALDACTONE) 100 MG tablet take 1 tablet by mouth twice daily 08/05/21   Storm Frisk, MD  valACYclovir (VALTREX) 1000 MG tablet Take 1 tablet (1,000 mg total) by mouth once daily. 05/29/21   Storm Frisk, MD  zolpidem (AMBIEN) 5 MG tablet Take 1 tablet  (5 mg total) by mouth at bedtime as needed for sleep. 01/31/22   Shanna Cisco, NP      Allergies    Patient has no known allergies.    Review of Systems   Review of Systems  Constitutional:  Negative for chills and fever.  Respiratory:  Negative for shortness of breath.   Cardiovascular:  Negative for chest pain.  Gastrointestinal:  Positive for abdominal pain. Negative for nausea and vomiting.  Neurological:  Negative for headaches.    Physical Exam Updated Vital Signs BP 113/74   Pulse 65   Temp 98.2 F (36.8 C) (Oral)   Resp 14   Ht 5\' 7"  (1.702 m)   Wt 74.8 kg   SpO2 100%   BMI 25.84 kg/m  Physical Exam Vitals and nursing note reviewed.  Constitutional:      General: She is not in acute distress.    Appearance: She is not ill-appearing.  HENT:     Head: Normocephalic and atraumatic.     Nose: No congestion.  Eyes:     Conjunctiva/sclera: Conjunctivae normal.  Cardiovascular:     Rate and Rhythm: Normal rate and regular rhythm.     Pulses: Normal pulses.     Heart sounds: No murmur heard.    No friction rub. No gallop.  Pulmonary:     Effort: No respiratory distress.     Breath sounds: No wheezing, rhonchi or rales.  Abdominal:     Palpations: Abdomen is soft.     Tenderness: There is abdominal tenderness. There is no right CVA tenderness or left CVA tenderness.     Comments: Abdomen nondistended, soft, slight tenderness noted in the epigastric region there is no right upper quadrant tenderness no guarding rebound has or peritoneal sign negative Murphy sign.  Skin:    General: Skin is warm and dry.  Neurological:     Mental Status: She is alert.  Psychiatric:        Mood and Affect: Mood normal.     ED Results / Procedures / Treatments   Labs (all labs ordered are listed, but only abnormal results are displayed) Labs Reviewed  COMPREHENSIVE METABOLIC PANEL - Abnormal; Notable for the following components:      Result Value   Calcium 8.5 (*)     Total Protein 6.4 (*)    All other components within normal limits  URINALYSIS, ROUTINE W REFLEX MICROSCOPIC - Abnormal; Notable for the following components:   APPearance HAZY (*)    Specific Gravity, Urine 1.034 (*)    Ketones, ur 5 (*)    All other components within normal limits  LIPASE, BLOOD  CBC  ACETAMINOPHEN LEVEL  I-STAT BETA HCG BLOOD, ED ([facility], [facility], [facility] ONLY)    EKG None  Radiology US Abdomen Limited  Result Date: 06/16/2022 CLINICAL DATA:  Right upper quadrant pain EXAM: ULTRASOUND ABDOMEN LIMITED  RIGHT UPPER QUADRANT COMPARISON:  None Available. FINDINGS: Gallbladder: Shadowing gallstone measuring at least 15 mm, persistently seen towards the neck. No wall thickening or focal tenderness. Common bile duct: Diameter: 2 mm Liver: No focal lesion identified. Within normal limits in parenchymal echogenicity. Portal vein is patent on color Doppler imaging with normal direction of blood flow towards the liver. IMPRESSION: Cholelithiasis without signs of acute cholecystitis. Electronically Signed   By: Tiburcio Pea M.D.   On: 06/16/2022 04:31    Procedures Procedures    Medications Ordered in ED Medications  sodium chloride 0.9 % bolus 1,000 mL (0 mLs Intravenous Stopped 06/16/22 0138)  ondansetron (ZOFRAN) injection 4 mg (4 mg Intravenous Given 06/16/22 0048)  HYDROmorphone (DILAUDID) injection 1 mg (1 mg Intravenous Given 06/16/22 0048)    ED Course/ Medical Decision Making/ A&P                             Medical Decision Making Amount and/or Complexity of Data Reviewed Labs: ordered. Radiology: ordered.  Risk Prescription drug management.   This patient presents to the ED for concern of epigastric pain, this involves an extensive number of treatment options, and is a complaint that carries with it a high risk of complications and morbidity.  The differential diagnosis includes cholecystitis, cholangitis, cholelithiasis, GERD, diverticulitis    Additional history  obtained:  Additional history obtained from N/A External records from outside source obtained and reviewed including behavioral health notes   Co morbidities that complicate the patient evaluation  Psychiatric disorder  Social Determinants of Health:  N/A    Lab Tests:  I Ordered, and personally interpreted labs.  The pertinent results include: CBC is unremarkable, CMP is unremarkable, lipase unremarkable, acetaminophen negative, hCG negative, UA unremarkable   Imaging Studies ordered:  I ordered imaging studies including limited ultrasound I independently visualized and interpreted imaging which showed ultrasound shows cholelithiasis without acute cholecystitis I agree with the radiologist interpretation   Cardiac Monitoring:  The patient was maintained on a cardiac monitor.  I personally viewed and interpreted the cardiac monitored which showed an underlying rhythm of: N/A   Medicines ordered and prescription drug management:  I ordered medication including intravenous fluids, pain medication, antiemetics I have reviewed the patients home medicines and have made adjustments as needed  Critical Interventions:  N/A   Reevaluation:  Presents with abdominal pain, will obtain screening lab workup provide with pain medication fluids and reassess.  Patient was reevaluated, states she is feeling better, her abdomen is soft nontender but still has slight epigastric tenderness, will obtain ultrasound of right upper quadrant to rule out possible cholecystitis.  Patient was reassessed she was found asleep, vital signs reassuring, states that she has no complaints and is in agreement with discharge at this time.    Consultations Obtained:  N/A    Test Considered:  CT AP-deferred as my suspicion for intra-abdominal abnormality is low as she has a nonsurgical abdomen.    Rule out low suspicion for lower lobe pneumonia as lung sounds are clear bilaterally, will  defer imaging at this time.  I have low suspicion for cholecystitis or cholangitis nontoxic-appearing vital signs reassuring no leukocytosis no elevation liver signs alk phos T. bili, ultrasounds negative for evidence of infection.  I doubt choledocholithiasis no ductal dilation.  Low suspicion for pancreatitis as lipase is within normal limits.  Low suspicion for ruptured stomach ulcer as she has no peritoneal sign present on  exam.  Low suspicion for bowel obstruction as abdomen is nondistended normal bowel sounds, so passing gas and having normal bowel movements.  Low suspicion for complicated diverticulitis as she is nontoxic-appearing, vital signs reassuring no leukocytosis present.  Low suspicion for appendicitis as she has no right lower quadrant tenderness, vital signs reassuring.  I have low suspicion for intra-abdominal infection as she has low risk factors, vital signs reassuring, no leukocytosis, will defer imaging at this time.     Dispostion and problem list  After consideration of the diagnostic results and the patients response to treatment, I feel that the patent would benefit from discharge.  Epigastric pain-unclear etiology, possible gastritis versus biliary colic, will start her on an acid pill, will give her information to decrease biliary colic, and have her follow-up with general surgery for further evaluation and strict return precautions.            Final Clinical Impression(s) / ED Diagnoses Final diagnoses:  Epigastric pain  Calculus of gallbladder without cholecystitis without obstruction    Rx / DC Orders ED Discharge Orders          Ordered    dicyclomine (BENTYL) 20 MG tablet  2 times daily PRN        06/16/22 0453    omeprazole (PRILOSEC) 20 MG capsule  Daily        06/16/22 0453              Carroll Sage, PA-C 06/16/22 0455    Glynn Octave, MD 06/16/22 [phone]

## 2022-06-17 ENCOUNTER — Other Ambulatory Visit: Payer: Self-pay

## 2022-06-18 ENCOUNTER — Telehealth: Payer: Self-pay

## 2022-06-18 NOTE — Transitions of Care (Post Inpatient/ED Visit) (Signed)
06/18/2022  Name: Sarah Evans MRN: [id number] DOB: 05/29/1980  Today's TOC FU Call Status: Today's TOC FU Call Status:: Successful TOC FU Call Competed TOC FU Call Complete Date: 06/18/22  Red on EMMI-ED Discharge Alert Date & Reason:06/17/22 "Scheduled follow-up appt? No"   Transition Care Management Follow-up Telephone Call Date of Discharge: 06/16/22 Discharge Facility: [facility]) Type of Discharge: Emergency Department Reason for ED Visit: Other: ("epigastric pain") How have you been since you were released from the hospital?: Same (pt reported she is still having abd pain-started her menstrual cycle yest and "bad menstrual cramps today-alternating between Tylenol and Pamprin"-just took some pain med a few mins ago.Denies any abnormal bleeding or other sxs.) Any questions or concerns?: No  Items Reviewed: Did you receive and understand the discharge instructions provided?: Yes Medications obtained,verified, and reconciled?: Partial Review Completed Reason for Partial Mediation Review: pt not feeling well and up to complete med review Any new allergies since your discharge?: No Dietary orders reviewed?: NA Do you have support at home?: Yes People in Home: parent(s) Name of Support/Comfort Primary Source: mom  Medications Reviewed Today: Medications Reviewed Today     Reviewed by Charlyn Minerva, RN (Registered Nurse) on 06/18/22 at 1524  Med List Status: <None>   Medication Order Taking? Sig Documenting Provider Last Dose Status Informant  acetaminophen (TYLENOL) 500 MG tablet [id number] Yes Take 1,000 mg by mouth every 6 (six) hours as needed for moderate pain. [provider] Taking Active Self  APAP-Pamabrom-Pyrilamine (PAMPRIN MAX PAIN FORMULA PO) [id number] Yes Take 2 tablets by mouth every 6 (six) hours as needed (menstrual cramps). [provider] Taking Active Self  atorvastatin (LIPITOR) 10 MG tablet [id number]  Take 1 tablet (10 mg  total) by mouth daily. Storm Frisk, MD  Active   clobetasol cream (TEMOVATE) 0.05 % [id number]  Apply 1 application topically to the affected area(s) 2 (two) times daily. Storm Frisk, MD  Active   diclofenac Sodium (VOLTAREN) 1 % GEL [id number]  Apply 2 g topically 4 (four) times daily. Storm Frisk, MD  Active   dicyclomine (BENTYL) 20 MG tablet [id number] Yes Take 1 tablet (20 mg total) by mouth 2 (two) times daily as needed for spasms. Carroll Sage, PA-C Taking Active   divalproex (DEPAKOTE ER) 500 MG 24 hr tablet [id number] Yes Take 1 tablet (500 mg total) by mouth daily. Shanna Cisco, NP Taking Active   Eflornithine HCl (VANIQA) 13.9 % cream [id number]  Apply to face twice daily as needed  Patient not taking: Reported on 02/19/2022   Storm Frisk, MD  Active   ketoconazole (NIZORAL) 2 % shampoo [id number]  Apply 1 Application topically 2 (two) times a week. Storm Frisk, MD  Active   meloxicam [facility]) 7.5 MG tablet [id number] Yes Take 1 tablet (7.5 mg total) by mouth 2 (two) times daily as needed for pain. Tarry Kos, MD Taking Active   metFORMIN (GLUCOPHAGE) 500 MG tablet [id number] Yes Take 1 tablet (500 mg total) by mouth once daily with breakfast. Storm Frisk, MD Taking Active   norgestimate-ethinyl estradiol (MILI) 0.25-35 MG-MCG tablet [id number]  Take 1 tablet by mouth once daily.  Patient not taking: Reported on 02/19/2022   Storm Frisk, MD  Active   omeprazole (PRILOSEC) 20 MG capsule [id number] Yes Take 1 capsule (20 mg total) by mouth daily. Carroll Sage, PA-C Taking Active   ondansetron [facility]) 4 MG tablet [id number] Yes Take 1  tablet (4 mg total) by mouth every 4 (four) hours as needed for nausea or vomiting. Storm Frisk, MD Taking Active   pantoprazole (PROTONIX) 20 MG tablet [id number] Yes Take 1 tablet (20 mg total) by mouth once daily. Storm Frisk, MD Taking Active   prazosin (MINIPRESS) 2 MG capsule [id number]  Take  1 capsule (2 mg total) by mouth once nightly at bedtime. Shanna Cisco, NP  Active   QUEtiapine (SEROQUEL) 50 MG tablet [id number]  Take 1 tablet (50 mg total) by mouth once daily. Shanna Cisco, NP  Active   QUEtiapine Fumarate (SEROQUEL XR) 150 MG 24 hr tablet [id number]  Take 1 tablet (150 mg total) by mouth at bedtime. Shanna Cisco, NP  Active   Semaglutide, 2 MG/DOSE, (OZEMPIC, 2 MG/DOSE,) 8 MG/3ML SOPN [id number]  Inject 2 mg as directed once a week. Must have office visit for refills Storm Frisk, MD  Active   sertraline (ZOLOFT) 50 MG tablet [id number]  Take 1 tablet (50 mg total) by mouth daily. Shanna Cisco, NP  Active   spironolactone (ALDACTONE) 100 MG tablet [id number]  take 1 tablet by mouth twice daily Storm Frisk, MD  Active   valACYclovir (VALTREX) 1000 MG tablet [id number]  Take 1 tablet (1,000 mg total) by mouth once daily. Storm Frisk, MD  Active   zolpidem (AMBIEN) 5 MG tablet [id number]  Take 1 tablet (5 mg total) by mouth at bedtime as needed for sleep. Shanna Cisco, NP  Active             Home Care and Equipment/Supplies: Were Home Health Services Ordered?: NA Any new equipment or medical supplies ordered?: NA  Functional Questionnaire: Do you need assistance with bathing/showering or dressing?: No Do you need assistance with meal preparation?: No Do you need assistance with eating?: No Do you have difficulty maintaining continence: No Do you need assistance with getting out of bed/getting out of a chair/moving?: No Do you have difficulty managing or taking your medications?: No  Follow up appointments reviewed: PCP Follow-up appointment confirmed?: No (Pt preferred to follow up with ob/gyn and [facility] office first before making PCP appt) Specialist Hospital Follow-up appointment confirmed?: No Reason Specialist Follow-Up Not Confirmed: Patient has Specialist Provider Number and will Call for Appointment (Pt has not called  [facility] office as listed on d/c paperwork-she will call office to make an appt as well as contact OB/GYN offfice) Do you need transportation to your follow-up appointment?: No (pt confirms she is able to drive herself to appts) Do you understand care options if your condition(s) worsen?: Yes-patient verbalized understanding  SDOH Interventions Today    Flowsheet Row Most Recent Value  SDOH Interventions   Food Insecurity Interventions Intervention Not Indicated  Transportation Interventions Intervention Not Indicated      TOC Interventions Today    Flowsheet Row Most Recent Value  TOC Interventions   TOC Interventions Discussed/Reviewed TOC Interventions Discussed      Interventions Today    Flowsheet Row Most Recent Value  General Interventions   General Interventions Discussed/Reviewed General Interventions Discussed, Doctor Visits  Doctor Visits Discussed/Reviewed Doctor Visits Discussed, Specialist, PCP  PCP/Specialist Visits Compliance with follow-up visit  Education Interventions   Education Provided Provided Education  Provided Verbal Education On Nutrition, When to see the doctor, Medication, Other  [pain/sx mgmt]  Nutrition Interventions   Nutrition Discussed/Reviewed Nutrition Discussed  Pharmacy Interventions   Pharmacy Dicussed/Reviewed Pharmacy Topics Discussed, Medications and their functions  Safety Interventions   Safety Discussed/Reviewed Safety Discussed       Alessandra Grout [facility] Care Management Care Management Community Coordinator Direct Phone: [phone] Toll Free: [phone] Fax: [phone]

## 2022-06-24 ENCOUNTER — Telehealth (HOSPITAL_COMMUNITY): Payer: Medicare PPO | Admitting: Psychiatry

## 2022-06-24 ENCOUNTER — Encounter (HOSPITAL_COMMUNITY): Payer: Self-pay

## 2022-06-25 NOTE — Therapy (Signed)
OUTPATIENT PHYSICAL THERAPY THORACOLUMBAR EVALUATION   Patient Name: Sarah Evans MRN: [id number] DOB:Jan 22, 1980, 42 y.o., female Today's Date: 06/30/2022  END OF SESSION:  PT End of Session - 06/30/22 1512     Visit Number 1    Number of Visits 9    Date for PT Re-Evaluation 08/25/22    Authorization Type [employer] medicare/medicaid    Authorization Time Period auth tbd    PT Start Time 1513   pt late check in   PT Stop Time 1547    PT Time Calculation (min) 34 min    Activity Tolerance Patient tolerated treatment well    Behavior During Therapy [facility] for tasks assessed/performed             Past Medical History:  Diagnosis Date   Bipolar 1 disorder (HCC)    Herpes simplex virus (HSV) infection    dx 2001   Polycystic ovary disease    PTSD (post-traumatic stress disorder)    Schizophrenia (HCC)    Stroke [facility])    Past Surgical History:  Procedure Laterality Date   BUBBLE STUDY  05/25/2020   Procedure: BUBBLE STUDY;  Surgeon: Chrystie Nose, MD;  Location: [facility] ENDOSCOPY;  Service: Cardiovascular;;   NO PAST SURGERIES     TEE WITHOUT CARDIOVERSION N/A 05/25/2020   Procedure: TRANSESOPHAGEAL ECHOCARDIOGRAM (TEE);  Surgeon: Chrystie Nose, MD;  Location: [facility] ENDOSCOPY;  Service: Cardiovascular;  Laterality: N/A;   Patient Active Problem List   Diagnosis Date Noted   Chronic pain of both knees 07/24/2021   Sleep apnea 06/20/2021   Cholelithiasis 06/20/2021   History of PCOS 05/30/2021   Hirsutism 05/30/2021   Chronic neck pain 11/01/2020   Chronic thoracic spine pain 10/25/2020   GERD without esophagitis 05/27/2020   Numbness and tingling 05/24/2020   History of stroke 05/23/2020   BMI 29.0-29.9,adult 05/21/2020   HSV (herpes simplex virus) anogenital infection 05/21/2020   Prediabetes 04/12/2020   Intrinsic eczema 04/12/2020   Mixed hyperlipidemia 04/12/2020   Muscle spasticity 04/12/2020   Moderate episode of recurrent major depressive disorder (HCC)  06/09/2019   Generalized anxiety disorder 06/09/2019   Personal history of nonsuicidal self-harm 04/28/2018   PTSD (post-traumatic stress disorder) 04/30/2015   Schizoaffective disorder, bipolar type (HCC) 04/27/2015    PCP: Storm Frisk, MD  REFERRING PROVIDER: London Sheer, MD  REFERRING DIAG: M54.50,G89.29 (ICD-10-CM) - Chronic bilateral low back pain, unspecified whether sciatica present  Rationale for Evaluation and Treatment: Rehabilitation  THERAPY DIAG:  Pain in thoracic spine  Other low back pain  ONSET DATE: since adolescence  SUBJECTIVE:  SUBJECTIVE STATEMENT: Pt states she has had back pain since she was 15, sharp/shooting pain along mid-low back. Tends to fluctuate with activity/stress and improve with rest. Waking up up to 3x/night due to pain, positional. No UE referral (into posterior shoulders), no LE pain. Describes some tingling in midback at times. Difficulty standing and lifting (laundry). States symptoms have been fairly consistent since onset as a teenager, no progressive symptoms. 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 Seems a bit better now that she's not working. Does report some LUE weakness since her stroke. No bowel/bladder symptoms, she voices some increased urgency about a year ago but stable.   PERTINENT HISTORY:  GERD, sleep apnea, schizoaffective disorder/bipolar, PTSD, anxiety/depression, history of stroke  PAIN:  Are you having pain: 5/10  Location/description: mid - low back and posterior shoulders  Best-worst over past week: 0-10/10  - aggravating factors: standing 15-30 min, lifting (laundry basket and trash bags), running too many errands in one day, stress - Easing factors: medication, rest    PRECAUTIONS: hx  stroke, psycho-social hx  WEIGHT BEARING RESTRICTIONS: No  FALLS:  Has patient fallen in last 6 months? No  LIVING ENVIRONMENT: Apartment, no stairs, lives alone with her parakeet  OCCUPATION: social security disability  PLOF: Independent  PATIENT GOALS: would like to   NEXT MD VISIT: Unsure   OBJECTIVE:   DIAGNOSTIC FINDINGS:  05/30/22 lumbar and cervical XR, refer to EPIC for details  PATIENT SURVEYS:  FOTO 31 current, 45 predicted  SCREENING FOR RED FLAGS: Red flag questioning/screening reassuring    COGNITION: Overall cognitive status: Within functional limits for tasks assessed     SENSATION: Denies sensory complaints  POSTURE: increased lumbar lordosis, slightly elevated UT BIL  PALPATION: Deferred given time constraints  LUMBAR ROM:   AROM eval  Flexion Mid shin LE tightness   Extension 75% pain  Right lateral flexion   Left lateral flexion   Right rotation 100%  Left rotation 75% R sided pain    (Blank rows = not tested) (Key: WFL = within functional limits not formally assessed, * = concordant pain, s = stiffness/stretching sensation, NT = not tested)   RANGE OF MOTION:     Active  Right eval Left eval  Shoulder flexion Mildly limited, symmetrical Mildly limited, symmetrical   Functional ER combo Scap spine Scap spine  Functional IR combo    Knee extension    Ankle dorsiflexion     (Blank rows = not tested) (Key: WFL = within functional limits not formally assessed, * = concordant pain, s = stiffness/stretching sensation, NT = not tested)  Comments: mild midback pain with both of above movements, mild reduction in LUE velocity pt attributes to hx of stroke but grossly symmetrical ROM   LOWER EXTREMITY MMT:    MMT Right eval Left eval  Hip flexion    Hip abduction (modified sitting)    Hip internal rotation    Hip external rotation    Knee flexion    Knee extension    Ankle dorsiflexion     (Blank rows = not tested) (Key: WFL =  within functional limits not formally assessed, * = concordant pain, s = stiffness/stretching sensation, NT = not tested)  Comments:    LUMBAR SPECIAL TESTS:  deferred  FUNCTIONAL TESTS:  Deferred given time constraints  GAIT: Distance walked: within clinic Assistive device utilized: None Level of assistance: Complete Independence Comments: reduced step length BIL, reduced trunk rotation  Vitals on eval: BP 101/74 HR 105-108bpm (manual and pulse ox) SpO2 99% on RA. Denies any other symptoms (see assessment below)  TODAY'S TREATMENT:                                                                                                                              [facility] Adult PT Treatment:                                                DATE: 06/30/22 Deferred given time constraints  PATIENT EDUCATION:  Education details: Pt education on PT impairments, prognosis, and POC. Informed consent. Rationale for interventions, monitoring symptoms and appropriate action  Person educated: Patient Education method: Explanation, Demonstration, Tactile cues, Verbal cues, and Handouts Education comprehension: verbalized understanding, returned demonstration, verbal cues required, tactile cues required, and needs further education    HOME EXERCISE PROGRAM: Deferred given time constraints  ASSESSMENT:  CLINICAL IMPRESSION: Pt is a pleasant 42 year old woman who arrives to PT evaluation on this date for chronic back pain. Pt reports difficulty with prolonged activities and lifting due to pain. During today's session pt demonstrates limitations in thoracolumbar mobility which are likely contributing to difficulty with aforementioned activities. Exam is limited due to time constraints with late check in, as well as pt reporting "feeling like I need to pant" (referring to breathing) with ROM exam. Vitals are checked and grossly WNL aside from mild tachycardia (see above), difficulty describing symptoms further but  overtly denies any red flag symptoms. Education is provided on red flag symptoms and criteria for presenting to ED/EMS, pt verbalizes understanding. No adverse events, pt notes her muscles feel a bit better after movement during exam but continues with about the same amount of pain. Recommend skilled PT to address aforementioned deficits to improve functional independence/tolerance. Pt departs today's session in no acute distress, all voiced questions/concerns addressed appropriately from PT perspective.    OBJECTIVE IMPAIRMENTS: decreased activity tolerance, decreased endurance, decreased mobility, difficulty walking, decreased ROM, decreased strength, impaired UE functional use, improper body mechanics, postural dysfunction, and pain.   ACTIVITY LIMITATIONS: carrying, lifting, bending, standing, sleeping, reach over head, and locomotion level  PARTICIPATION LIMITATIONS: meal prep, cleaning, laundry, community activity, and occupation  PERSONAL FACTORS: Time since onset of injury/illness/exacerbation and 3+ comorbidities: hx stroke, pysch-social comorbidities  are also affecting patient's functional outcome.   REHAB POTENTIAL: Fair given chronicity and comorbidities  CLINICAL DECISION MAKING: Stable/uncomplicated  EVALUATION COMPLEXITY: Low   GOALS: Goals reviewed with patient? No given time constraints  SHORT TERM GOALS: Target date: 07/28/2022 Pt will demonstrate appropriate understanding and performance of initially prescribed HEP in order to facilitate improved independence with management of symptoms.  Baseline: HEP TBD Goal status: INITIAL   2. Pt will score greater than or equal to 38 on FOTO in  order to demonstrate improved perception of function due to symptoms.  Baseline: 31  Goal status: INITIAL    LONG TERM GOALS: Target date: 08/25/2022 Pt will score 45 on FOTO in order to demonstrate improved perception of functional status due to symptoms.  Baseline: 31 Goal status:  INITIAL  2.  Pt will demonstrate grossly symmetrical and painless lumbar rotation AROM in order to demonstrate improved tolerance to functional movement patterns.  Baseline: see ROM chart above Goal status: INITIAL  3.  Pt will be able to lift/carry up to 10# with less than 3 pt increase in resting pain on NPS in order to improve tolerance to household tasks such as laundry.  Baseline: pain w/ laundry  Goal status: INITIAL  4. Pt will report ability to stand/walk for up to 30 min in order to improve tolerance to household tasks such as cleaning/dishes.   Baseline: pain with dishes and other housework  Goal status: INITIAL   5. Pt will endorse at least 50% improvement in frequency of nighttime waking due to pain in order to improve overall health and QOL.   Baseline: waking up up to 3x/night  Goal status: INITIAL  6. Pt will report at least 50% decrease in overall pain levels in past week in order to facilitate improved tolerance to basic ADLs/mobility.   Baseline: 0-10/10 over past week  Goal status: INITIAL    PLAN:  PT FREQUENCY: 1x/week  PT DURATION: 8 weeks  PLANNED INTERVENTIONS: Therapeutic exercises, Therapeutic activity, Neuromuscular re-education, Balance training, Gait training, Patient/Family education, Self Care, Joint mobilization, Stair training, Aquatic Therapy, Dry Needling, Spinal mobilization, Cryotherapy, Moist heat, Taping, Manual therapy, and Re-evaluation.  PLAN FOR NEXT SESSION: establish HEP. Monitor vitals PRN. Look at 5xSTS vs TUG. Work on gentle lumbar mobility and core stability. Symptom modification strategies as indicated/appropriate.    Ashley Murrain PT, DPT 06/30/2022 4:46 PM    Referring diagnosis? M54.50,G89.29 (ICD-10-CM) - Chronic bilateral low back pain, unspecified whether sciatica present Treatment diagnosis? (if different than referring diagnosis)  Pain in thoracic spine  Other low back pain  What was this (referring dx) caused  by? []  Surgery []  Fall [x]  Ongoing issue []  Arthritis []  Other: ____________  Laterality: []  Rt []  Lt [x]  Both  Check all possible CPT codes:  *CHOOSE 10 OR LESS*    []  97110 (Therapeutic Exercise)  []  92507 (SLP Treatment)  []  97112 (Neuro Re-ed)   []  92526 (Swallowing Treatment)   []  97116 (Gait Training)   []  [id number] (Cognitive Training, 1st 15 minutes) []  97140 (Manual Therapy)   []  97130 (Cognitive Training, each add'l 15 minutes)  []  97164 (Re-evaluation)                              []  Other, List CPT Code ____________  []  97530 (Therapeutic Activities)     []  97535 (Self Care)   [x]  All codes above (97110 - 97535)  []  97012 (Mechanical Traction)  []  97014 (E-stim Unattended)  []  13244 (E-stim manual)  []  97033 (Ionto)  []  97035 (Ultrasound) [x]  97750 (Physical Performance Training) [x]  [id number] (Aquatic Therapy) []  97016 (Vasopneumatic Device) []  [id number] (Paraffin) []  97034 (Contrast Bath) []  97597 (Wound Care 1st 20 sq cm) []  97598 (Wound Care each add'l 20 sq cm) []  97760 (Orthotic Fabrication, Fitting, Training Initial) []  [id number] (Prosthetic Management and Training Initial) []  [id number] (Orthotic or Prosthetic Training/ Modification Subsequent)

## 2022-06-30 ENCOUNTER — Encounter: Payer: Self-pay | Admitting: Physical Therapy

## 2022-06-30 ENCOUNTER — Ambulatory Visit: Payer: Medicare PPO | Attending: Orthopedic Surgery | Admitting: Physical Therapy

## 2022-06-30 ENCOUNTER — Other Ambulatory Visit: Payer: Self-pay

## 2022-06-30 DIAGNOSIS — M5459 Other low back pain: Secondary | ICD-10-CM | POA: Insufficient documentation

## 2022-06-30 DIAGNOSIS — G8929 Other chronic pain: Secondary | ICD-10-CM | POA: Diagnosis not present

## 2022-06-30 DIAGNOSIS — M546 Pain in thoracic spine: Secondary | ICD-10-CM | POA: Diagnosis not present

## 2022-06-30 DIAGNOSIS — M545 Low back pain, unspecified: Secondary | ICD-10-CM | POA: Insufficient documentation

## 2022-07-06 ENCOUNTER — Other Ambulatory Visit: Payer: Self-pay | Admitting: Critical Care Medicine

## 2022-07-08 ENCOUNTER — Telehealth (INDEPENDENT_AMBULATORY_CARE_PROVIDER_SITE_OTHER): Payer: Medicare PPO | Admitting: Psychiatry

## 2022-07-08 ENCOUNTER — Encounter (HOSPITAL_COMMUNITY): Payer: Self-pay | Admitting: Psychiatry

## 2022-07-08 ENCOUNTER — Other Ambulatory Visit: Payer: Self-pay

## 2022-07-08 DIAGNOSIS — F4310 Post-traumatic stress disorder, unspecified: Secondary | ICD-10-CM | POA: Diagnosis not present

## 2022-07-08 DIAGNOSIS — F5105 Insomnia due to other mental disorder: Secondary | ICD-10-CM

## 2022-07-08 DIAGNOSIS — F250 Schizoaffective disorder, bipolar type: Secondary | ICD-10-CM

## 2022-07-08 DIAGNOSIS — F99 Mental disorder, not otherwise specified: Secondary | ICD-10-CM | POA: Diagnosis not present

## 2022-07-08 MED ORDER — QUETIAPINE FUMARATE ER 200 MG PO TB24: 200.0000 mg | ORAL_TABLET | Freq: Every day | ORAL | 3 refills | Status: DC

## 2022-07-08 MED ORDER — QUETIAPINE FUMARATE 50 MG PO TABS
50.0000 mg | ORAL_TABLET | Freq: Every day | ORAL | 3 refills | Status: DC
Start: 2022-07-08 — End: 2022-08-26

## 2022-07-08 MED ORDER — PRAZOSIN HCL 2 MG PO CAPS
2.0000 mg | ORAL_CAPSULE | Freq: Every day | ORAL | 2 refills | Status: DC
Start: 2022-07-08 — End: 2022-08-26

## 2022-07-08 MED ORDER — ZOLPIDEM TARTRATE 5 MG PO TABS
5.0000 mg | ORAL_TABLET | Freq: Every evening | ORAL | 3 refills | Status: DC | PRN
Start: 2022-07-08 — End: 2022-08-26

## 2022-07-08 MED ORDER — OZEMPIC (2 MG/DOSE) 8 MG/3ML ~~LOC~~ SOPN
2.0000 mg | PEN_INJECTOR | SUBCUTANEOUS | 0 refills | Status: DC
  Filled 2022-07-08: qty 3, 28d supply, fill #0

## 2022-07-08 NOTE — Progress Notes (Signed)
BH MD/PA/NP OP Progress Note Virtual Visit via Video Note  I connected with Sarah Evans on 07/08/22 at  3:00 PM EDT by a video enabled telemedicine application and verified that I am speaking with the correct person using two identifiers.  Location: Patient: Home Provider: Clinic   I discussed the limitations of evaluation and management by telemedicine and the availability of in person appointments. The patient expressed understanding and agreed to proceed.  I provided 30 minutes of non-face-to-face time during this encounter.             07/08/2022 3:40 PM Sarah Evans  MRN:  [id number]  Chief Complaint: "I stopped my Depakote"    HPI: 42 year old female seen today for follow up psychiatric evaluation. She has psychiatric history of schizoaffective disorder, bipolar 1, depression, anxiety, and PTSD. She is currently managed on Zoloft 50 mg daily, Depakote 500 daily, Prazosin 2 mg nightly, Lamictal 100 mg daily, Seroquel 200 mg nightly, Ambien 5 mg, and Seroquel 50 mg daily. She notes she discontinued Zoloft, Depakote, or Lamictal  and reports that her other medications are effective in managing her symptoms.   Today she logged in virtually but her camera was turned off.  During exam she was pleasant, cooperative, and engaged in conversation.  She informed Clinical research associate that she stopped Depakote due to abdominal pain.  Patient uncertain when she discontinued Zoloft or Lamictal.  She informed writer that her mood continues to be stable and notes that she has limited anxiety and depression.  Today provider conducted GAD-7 and patient scored a 2, at her last visit she scored a 2.  Provider also conducted PHQ-9 the patient scored a 7, at her last visit she scored a 10.  She endorses adequate sleep with Ambien.  Patient notes that her hallucinations has diminished.  She denies auditory hallucinations and notes that she only has visual hallucinations nightly.  She reports that at times she sees  shadows.  Today she denies SI/HI/AH, mania, paranoia.   Patient continues to be on Ozempic and continues to lose weight.  She notes that she has lost 10 pounds since her last visit.   No medication changes made today.  Patient agreeable to continue medication as prescribed.  No other concerns at this time.        Visit Diagnosis:    ICD-10-CM   1. Schizoaffective disorder, bipolar type (HCC)  F25.0 QUEtiapine (SEROQUEL) 50 MG tablet    QUEtiapine Fumarate (SEROQUEL XR) 200 MG 24 hr tablet    2. PTSD (post-traumatic stress disorder)  F43.10 prazosin (MINIPRESS) 2 MG capsule    3. Insomnia due to other mental disorder  F51.05 zolpidem (AMBIEN) 5 MG tablet   F99       Past Psychiatric History: Schizoaffective disorder, Bipolar 1, Depression, Anxiety, and PTSD  Past Medical History:  Past Medical History:  Diagnosis Date   Bipolar 1 disorder (HCC)    Herpes simplex virus (HSV) infection    dx 2001   Polycystic ovary disease    PTSD (post-traumatic stress disorder)    Schizophrenia (HCC)    Stroke [facility])     Past Surgical History:  Procedure Laterality Date   BUBBLE STUDY  05/25/2020   Procedure: BUBBLE STUDY;  Surgeon: Chrystie Nose, MD;  Location: [facility] ENDOSCOPY;  Service: Cardiovascular;;   NO PAST SURGERIES     TEE WITHOUT CARDIOVERSION N/A 05/25/2020   Procedure: TRANSESOPHAGEAL ECHOCARDIOGRAM (TEE);  Surgeon: Chrystie Nose, MD;  Location: [facility] ENDOSCOPY;  Service: Cardiovascular;  Laterality: N/A;    Family Psychiatric History: Bipolar, Schizophrenia, and Depressed. Father Bipolar disorder.   Family History:  Family History  Problem Relation Age of Onset   Bipolar disorder Mother    Diabetes Maternal Grandmother    Hypertension Neg Hx    Heart disease Neg Hx    Cancer Neg Hx     Social History:  Social History   Socioeconomic History   Marital status: Single    Spouse name: Not on file   Number of children: Not on file   Years of education: Not on file    Highest education level: Not on file  Occupational History   Not on file  Tobacco Use   Smoking status: Former    Packs/day: 0.25    Years: 15.00    Additional pack years: 0.00    Total pack years: 3.75    Types: Cigarettes    Quit date: 05/02/2020    Years since quitting: 2.1   Smokeless tobacco: Never  Vaping Use   Vaping Use: Never used  Substance and Sexual Activity   Alcohol use: No   Drug use: No   Sexual activity: Not Currently    Birth control/protection: Abstinence  Other Topics Concern   Not on file  Social History Narrative   Not on file   Social Determinants of Health   Financial Resource Strain: Not on file  Food Insecurity: No Food Insecurity (06/18/2022)   Hunger Vital Sign    Worried About Running Out of Food in the Last Year: Never true    Ran Out of Food in the Last Year: Never true  Transportation Needs: No Transportation Needs (06/18/2022)   PRAPARE - Administrator, Civil Service (Medical): No    Lack of Transportation (Non-Medical): No  Physical Activity: Not on file  Stress: Not on file  Social Connections: Not on file    Allergies: No Known Allergies  Metabolic Disorder Labs: Lab Results  Component Value Date   HGBA1C 5.8 (H) 07/17/2021   MPG 117 06/15/2020   MPG 128.37 05/24/2020   Lab Results  Component Value Date   PROLACTIN 13.0 04/28/2015   Lab Results  Component Value Date   CHOL 220 (H) 07/17/2021   TRIG 101 07/17/2021   HDL 89 07/17/2021   CHOLHDL 2.5 07/17/2021   VLDL 28 05/24/2020   LDLCALC 114 (H) 07/17/2021   LDLCALC 63 05/24/2020   Lab Results  Component Value Date   TSH 2.110 04/02/2020   TSH 2.040 10/26/2018    Therapeutic Level Labs: No results found for: "LITHIUM" No results found for: "VALPROATE" No results found for: "CBMZ"  Current Medications: Current Outpatient Medications  Medication Sig Dispense Refill   acetaminophen (TYLENOL) 500 MG tablet Take 1,000 mg by mouth every 6 (six)  hours as needed for moderate pain.     APAP-Pamabrom-Pyrilamine (PAMPRIN MAX PAIN FORMULA PO) Take 2 tablets by mouth every 6 (six) hours as needed (menstrual cramps).     atorvastatin (LIPITOR) 10 MG tablet Take 1 tablet (10 mg total) by mouth daily. 30 tablet 2   clobetasol cream (TEMOVATE) 0.05 % Apply 1 application topically to the affected area(s) 2 (two) times daily. 30 g 0   diclofenac Sodium (VOLTAREN) 1 % GEL Apply 2 g topically 4 (four) times daily. 50 g 2   dicyclomine (BENTYL) 20 MG tablet Take 1 tablet (20 mg total) by mouth 2 (two) times daily as needed for spasms. 20  tablet 0   Eflornithine HCl (VANIQA) 13.9 % cream Apply to face twice daily as needed (Patient not taking: Reported on 02/19/2022) 45 g 2   ketoconazole (NIZORAL) 2 % shampoo Apply 1 Application topically 2 (two) times a week. 120 mL 0   meloxicam (MOBIC) 7.5 MG tablet Take 1 tablet (7.5 mg total) by mouth 2 (two) times daily as needed for pain. 30 tablet 2   metFORMIN (GLUCOPHAGE) 500 MG tablet Take 1 tablet (500 mg total) by mouth once daily with breakfast. 60 tablet 3   norgestimate-ethinyl estradiol (MILI) 0.25-35 MG-MCG tablet Take 1 tablet by mouth once daily. (Patient not taking: Reported on 02/19/2022) 28 tablet 11   omeprazole (PRILOSEC) 20 MG capsule Take 1 capsule (20 mg total) by mouth daily. 30 capsule 0   ondansetron (ZOFRAN) 4 MG tablet Take 1 tablet (4 mg total) by mouth every 4 (four) hours as needed for nausea or vomiting. 8 tablet 0   pantoprazole (PROTONIX) 20 MG tablet Take 1 tablet (20 mg total) by mouth once daily. 30 tablet 1   prazosin (MINIPRESS) 2 MG capsule Take 1 capsule (2 mg total) by mouth once nightly at bedtime. 30 capsule 2   QUEtiapine (SEROQUEL) 50 MG tablet Take 1 tablet (50 mg total) by mouth once daily. 30 tablet 3   QUEtiapine Fumarate (SEROQUEL XR) 200 MG 24 hr tablet Take 1 tablet (200 mg total) by mouth at bedtime. 30 tablet 3   Semaglutide, 2 MG/DOSE, (OZEMPIC, 2 MG/DOSE,) 8  MG/3ML SOPN Inject 2 mg as directed once a week. Must have office visit for refills 3 mL 0   spironolactone (ALDACTONE) 100 MG tablet take 1 tablet by mouth twice daily 390 tablet 2   valACYclovir (VALTREX) 1000 MG tablet Take 1 tablet (1,000 mg total) by mouth once daily. 60 tablet 2   zolpidem (AMBIEN) 5 MG tablet Take 1 tablet (5 mg total) by mouth at bedtime as needed for sleep. 30 tablet 3   No current facility-administered medications for this visit.     Musculoskeletal: Strength & Muscle Tone:  Unable to assess due to patients camera off Gait & Station:  Unable to assess due topatients camera off Patient leans: N/A  Psychiatric Specialty Exam: Review of Systems  There were no vitals taken for this visit.There is no height or weight on file to calculate BMI.  General Appearance:  Unable to assess due to patients camera off  Eye Contact:   Unable to assess due to patients camera off  Speech:  Clear and Coherent and Normal Rate  Volume:  Normal  Mood:  Euthymic  Affect:  Congruent  Thought Process:  Coherent, Goal Directed and Linear  Orientation:  Full (Time, Place, and Person)  Thought Content: WDL and Logical   Suicidal Thoughts:  No  Homicidal Thoughts:  No  Memory:  Immediate;   Good Recent;   Good Remote;   Good  Judgement:  Good  Insight:  Good  Psychomotor Activity:  Normal  Concentration:  Concentration: Good and Attention Span: Good  Recall:  Good  Fund of Knowledge: Good  Language: Good  Akathisia:   Unable to assess duepatients camera off  Handed:  Right  AIMS (if indicated): Not done  Assets:  Communication Skills Desire for Improvement Housing Leisure Time Social Support  ADL's:  Intact  Cognition: WNL  Sleep:  Good   Screenings: AUDIT    Flowsheet Row [facility]  Alcohol Use Disorder Identification Test Final Score (AUDIT) 0      GAD-7    Flowsheet Row Video Visit  from 07/08/2022 in [facility] Office Visit from [facility]/19/2022 in [facility] Video Visit from 01/31/2022 in [facility] Video Visit from 10/30/2021 in [facility] Office Visit from 07/24/2021 in [facility]  Total GAD-7 Score 2 7 2 2  0      PHQ2-9    Flowsheet Row Video Visit from 07/08/2022 in [facility] Office Visit from 02/19/2022 in [facility] Video Visit from 01/31/2022 in [facility] Video Visit from 10/30/2021 in [facility] Office Visit from 07/24/2021 in [facility]  PHQ-2 Total Score 0 0 6 2 0  PHQ-9 Total Score 7 0 10 8 8       Flowsheet Row ED from 06/15/2022 in [facility] at [facility] ED from 05/22/2021 in [facility] Emergency Department at [facility] ED from 04/11/2021 in [facility] Emergency Department at [facility]  C-SSRS RISK CATEGORY No Risk No Risk No Risk        Assessment and Plan: Patient reports that her sleep, anxiety, and depression has improved since her last visit.  She notes that her [facility] has also improved noting that she only sees shadows at night.  Patient has not been taking Zoloft, Lamictal, or Depakote.  At this time those medications are restarted.  She will continue Seroquel, prazosin, and Ambien as prescribed.    1. Schizoaffective disorder, bipolar type (HCC)  Continue- QUEtiapine (SEROQUEL) 50 MG tablet; Take 1 tablet (50 mg total) by mouth once daily.  Dispense: 30 tablet; Refill: 3 Continue- QUEtiapine Fumarate (SEROQUEL XR) 200 MG 24 hr tablet; Take 1 tablet (200 mg total) by mouth at bedtime.  Dispense: 30 tablet; Refill: 3  2. PTSD (post-traumatic stress  disorder)  Continue- prazosin (MINIPRESS) 2 MG capsule; Take 1 capsule (2 mg total) by mouth once nightly at bedtime.  Dispense: 30 capsule; Refill: 2  3. Insomnia due to other mental disorder  Continue- zolpidem (AMBIEN) 5 MG tablet; Take 1 tablet (5 mg total) by mouth at bedtime as needed for sleep.  Dispense: 30 tablet; Refill: 3      Follow up in 3 months Follow-up with therapy   Shanna Cisco, NP 07/08/2022, 3:40 PM

## 2022-07-14 NOTE — Therapy (Signed)
OUTPATIENT PHYSICAL THERAPY TREATMENT   Patient Name: Sarah Evans MRN: [id number] DOB:February 06, 1980, 41 y.o., female Today's Date: 07/15/2022  END OF SESSION:  PT End of Session - 07/15/22 1538     Visit Number 2    Number of Visits 9    Date for PT Re-Evaluation 08/25/22    Authorization Type [employer] medicare/medicaid    Authorization Time Period 8 visits07/08/24-09/07/24    Authorization - Visit Number 1    Authorization - Number of Visits 8    Progress Note Due on Visit 10    PT Start Time 1539   late check in   PT Stop Time 1615    PT Time Calculation (min) 36 min    Activity Tolerance Patient tolerated treatment well    Behavior During Therapy WFL for tasks assessed/performed              Past Medical History:  Diagnosis Date   Bipolar 1 disorder (HCC)    Herpes simplex virus (HSV) infection    dx 2001   Polycystic ovary disease    PTSD (post-traumatic stress disorder)    Schizophrenia (HCC)    Stroke [facility])    Past Surgical History:  Procedure Laterality Date   BUBBLE STUDY  05/25/2020   Procedure: BUBBLE STUDY;  Surgeon: Chrystie Nose, MD;  Location: [facility] ENDOSCOPY;  Service: Cardiovascular;;   NO PAST SURGERIES     TEE WITHOUT CARDIOVERSION N/A 05/25/2020   Procedure: TRANSESOPHAGEAL ECHOCARDIOGRAM (TEE);  Surgeon: Chrystie Nose, MD;  Location: [facility] ENDOSCOPY;  Service: Cardiovascular;  Laterality: N/A;   Patient Active Problem List   Diagnosis Date Noted   Chronic pain of both knees 07/24/2021   Sleep apnea 06/20/2021   Cholelithiasis 06/20/2021   History of PCOS 05/30/2021   Hirsutism 05/30/2021   Chronic neck pain 11/01/2020   Chronic thoracic spine pain 10/25/2020   GERD without esophagitis 05/27/2020   Numbness and tingling 05/24/2020   History of stroke 05/23/2020   BMI 29.0-29.9,adult 05/21/2020   HSV (herpes simplex virus) anogenital infection 05/21/2020   Prediabetes 04/12/2020   Intrinsic eczema 04/12/2020   Mixed hyperlipidemia  04/12/2020   Muscle spasticity 04/12/2020   Moderate episode of recurrent major depressive disorder (HCC) 06/09/2019   Generalized anxiety disorder 06/09/2019   Personal history of nonsuicidal self-harm 04/28/2018   PTSD (post-traumatic stress disorder) 04/30/2015   Schizoaffective disorder, bipolar type (HCC) 04/27/2015    PCP: Storm Frisk, MD  REFERRING PROVIDER: London Sheer, MD  REFERRING DIAG: M54.50,G89.29 (ICD-10-CM) - Chronic bilateral low back pain, unspecified whether sciatica present  Rationale for Evaluation and Treatment: Rehabilitation  THERAPY DIAG:  Pain in thoracic spine  Other low back pain  ONSET DATE: since adolescence  SUBJECTIVE:  Per eval - Pt states she has had back pain since she was 15, sharp/shooting pain along mid-low back. Tends to fluctuate with activity/stress and improve with rest. Waking up up to 3x/night due to pain, positional. No UE referral (into posterior shoulders), no LE pain. Describes some tingling in midback at times. Difficulty standing and lifting (laundry). States symptoms have been fairly consistent since onset as a teenager, no progressive symptoms. 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 Seems a bit better now that she's not working. Does report some LUE weakness since her stroke. No bowel/bladder symptoms, she voices some increased urgency about a year ago but stable.   SUBJECTIVE STATEMENT: 07/15/2022 Pt arrives w/ 5/10 back pain, denies any changes since initial eval. Denies any other new updates  PERTINENT HISTORY:  GERD, sleep apnea, schizoaffective disorder/bipolar, PTSD, anxiety/depression, history of stroke  PAIN:  Are you having pain: 5/10  Location/description: mid - low back and posterior  shoulders   Per eval -  Best-worst over past week: 0-10/10  - aggravating factors: standing 15-30 min, lifting (laundry basket and trash bags), running too many errands in one day, stress - Easing factors: medication, rest    PRECAUTIONS: hx stroke, psycho-social hx  WEIGHT BEARING RESTRICTIONS: No  FALLS:  Has patient fallen in last 6 months? No  LIVING ENVIRONMENT: Apartment, no stairs, lives alone with her parakeet  OCCUPATION: social security disability  PLOF: Independent  PATIENT GOALS: would like to   NEXT MD VISIT: Unsure   OBJECTIVE: (objective measures completed at initial evaluation unless otherwise dated)   DIAGNOSTIC FINDINGS:  05/30/22 lumbar and cervical XR, refer to EPIC for details  PATIENT SURVEYS:  FOTO 31 current, 45 predicted  SCREENING FOR RED FLAGS: Red flag questioning/screening reassuring    COGNITION: Overall cognitive status: Within functional limits for tasks assessed     SENSATION: Denies sensory complaints  POSTURE: increased lumbar lordosis, slightly elevated UT BIL  PALPATION: Deferred given time constraints  LUMBAR ROM:   AROM eval  Flexion Mid shin LE tightness   Extension 75% pain  Right lateral flexion   Left lateral flexion   Right rotation 100%  Left rotation 75% R sided pain    (Blank rows = not tested) (Key: WFL = within functional limits not formally assessed, * = concordant pain, s = stiffness/stretching sensation, NT = not tested)   RANGE OF MOTION:     Active  Right eval Left eval  Shoulder flexion Mildly limited, symmetrical Mildly limited, symmetrical   Functional ER combo Scap spine Scap spine  Functional IR combo    Knee extension    Ankle dorsiflexion     (Blank rows = not tested) (Key: WFL = within functional limits not formally assessed, * = concordant pain, s = stiffness/stretching sensation, NT = not tested)  Comments: mild midback pain with both of above movements, mild reduction in LUE velocity  pt attributes to hx of stroke but grossly symmetrical ROM   LOWER EXTREMITY MMT:    MMT Right eval Left eval  Hip flexion    Hip abduction (modified sitting)    Hip internal rotation    Hip external rotation    Knee flexion    Knee extension    Ankle dorsiflexion     (Blank rows = not tested) (Key: WFL = within functional limits not formally assessed, * = concordant pain, s = stiffness/stretching sensation, NT = not tested)  Comments:  LUMBAR SPECIAL TESTS:  deferred  FUNCTIONAL TESTS:  Deferred given time constraints  GAIT: Distance walked: within clinic Assistive device utilized: None Level of assistance: Complete Independence Comments: reduced step length BIL, reduced trunk rotation  Vitals on eval: BP 101/74 HR 105-108bpm (manual and pulse ox) SpO2 99% on RA. Denies any other symptoms (see assessment below)  Vitals 07/15/22: HR 92-96bpm SpO2 98% RA  TODAY'S TREATMENT:                                                                                                                              [facility] Adult PT Treatment:                                                DATE: 07/15/22 Therapeutic Exercise: Seated sidebending iso into swiss ball 2x8 each way cues for setup and breath control  Seated adduction iso with ball 2x10 cues for form and posture  LTR x8 each direction cues for comfortable ROM and breath control Supine pelvic tilt x10 cues for breath control and comfortable ROM Seated swiss ball flexion rollout x10 cues for comfortable ROM  HEP update + education/handout   PATIENT EDUCATION:  Education details: rationale for interventions, HEP  Person educated: Patient Education method: Programmer, multimedia, Demonstration, Tactile cues, Verbal cues, and Handouts Education comprehension: verbalized understanding, returned demonstration, verbal cues required, tactile cues required, and needs further education    HOME EXERCISE PROGRAM: Access Code: [email or web address] URL:  [email or web address] Date: 07/15/2022 Prepared by: Fransisco Hertz  Exercises - Supine Posterior Pelvic Tilt  - 2-3 x daily - 7 x weekly - 1 sets - 10 reps - Seated Hip Adduction Isometrics with Ball  - 2-3 x daily - 7 x weekly - 1 sets - 10 reps - Seated Flexion Stretch  - 2-3 x daily - 7 x weekly - 1 sets - 10 reps  ASSESSMENT:  CLINICAL IMPRESSION: 07/15/2022 Pt arrives w/ 5/10 pain, no issues after last session. Today focusing on establishing basic lumbopelvic program with emphasis on core/hip stability, pt tolerates well overall. Notes most relief from lumbar flexion stretch at end of session. Departs with 3/10 pain, no adverse events, HEP prescription as above with education on appropriate performance. Recommend continuing along current POC in order to address relevant deficits and improve functional tolerance. Pt departs today's session in no acute distress, all voiced questions/concerns addressed appropriately from PT perspective.    Per eval - Pt is a pleasant 42 year old woman who arrives to PT evaluation on this date for chronic back pain. Pt reports difficulty with prolonged activities and lifting due to pain. During today's session pt demonstrates limitations in thoracolumbar mobility which are likely contributing to difficulty with aforementioned activities. Exam is limited due to time constraints with late check in, as well as pt reporting "feeling like  I need to pant" (referring to breathing) with ROM exam. Vitals are checked and grossly WNL aside from mild tachycardia (see above), difficulty describing symptoms further but overtly denies any red flag symptoms. Education is provided on red flag symptoms and criteria for presenting to ED/EMS, pt verbalizes understanding. No adverse events, pt notes her muscles feel a bit better after movement during exam but continues with about the same amount of pain. Recommend skilled PT to address aforementioned deficits to improve  functional independence/tolerance. Pt departs today's session in no acute distress, all voiced questions/concerns addressed appropriately from PT perspective.    OBJECTIVE IMPAIRMENTS: decreased activity tolerance, decreased endurance, decreased mobility, difficulty walking, decreased ROM, decreased strength, impaired UE functional use, improper body mechanics, postural dysfunction, and pain.   ACTIVITY LIMITATIONS: carrying, lifting, bending, standing, sleeping, reach over head, and locomotion level  PARTICIPATION LIMITATIONS: meal prep, cleaning, laundry, community activity, and occupation  PERSONAL FACTORS: Time since onset of injury/illness/exacerbation and 3+ comorbidities: hx stroke, pysch-social comorbidities  are also affecting patient's functional outcome.   REHAB POTENTIAL: Fair given chronicity and comorbidities  CLINICAL DECISION MAKING: Stable/uncomplicated  EVALUATION COMPLEXITY: Low   GOALS: Goals reviewed with patient? No given time constraints  SHORT TERM GOALS: Target date: 07/28/2022 Pt will demonstrate appropriate understanding and performance of initially prescribed HEP in order to facilitate improved independence with management of symptoms.  Baseline: HEP TBD Goal status: INITIAL   2. Pt will score greater than or equal to 38 on FOTO in order to demonstrate improved perception of function due to symptoms.  Baseline: 31  Goal status: INITIAL    LONG TERM GOALS: Target date: 08/25/2022 Pt will score 45 on FOTO in order to demonstrate improved perception of functional status due to symptoms.  Baseline: 31 Goal status: INITIAL  2.  Pt will demonstrate grossly symmetrical and painless lumbar rotation AROM in order to demonstrate improved tolerance to functional movement patterns.  Baseline: see ROM chart above Goal status: INITIAL  3.  Pt will be able to lift/carry up to 10# with less than 3 pt increase in resting pain on NPS in order to improve tolerance to  household tasks such as laundry.  Baseline: pain w/ laundry  Goal status: INITIAL  4. Pt will report ability to stand/walk for up to 30 min in order to improve tolerance to household tasks such as cleaning/dishes.   Baseline: pain with dishes and other housework  Goal status: INITIAL   5. Pt will endorse at least 50% improvement in frequency of nighttime waking due to pain in order to improve overall health and QOL.   Baseline: waking up up to 3x/night  Goal status: INITIAL  6. Pt will report at least 50% decrease in overall pain levels in past week in order to facilitate improved tolerance to basic ADLs/mobility.   Baseline: 0-10/10 over past week  Goal status: INITIAL    PLAN:  PT FREQUENCY: 1x/week  PT DURATION: 8 weeks  PLANNED INTERVENTIONS: Therapeutic exercises, Therapeutic activity, Neuromuscular re-education, Balance training, Gait training, Patient/Family education, Self Care, Joint mobilization, Stair training, Aquatic Therapy, Dry Needling, Spinal mobilization, Cryotherapy, Moist heat, Taping, Manual therapy, and Re-evaluation.  PLAN FOR NEXT SESSION: Continue to work on lumbopelvic stability/mobility PRN. Symptom modification strategies as appropriate PRN. Look at 5xSTS as able/appropriate.    Ashley Murrain PT, DPT 07/15/2022 4:20 PM    Referring diagnosis? M54.50,G89.29 (ICD-10-CM) - Chronic bilateral low back pain, unspecified whether sciatica present Treatment diagnosis? (if different than referring diagnosis)  Pain in thoracic spine  Other low back pain  What was this (referring dx) caused by? []  Surgery []  Fall [x]  Ongoing issue []  Arthritis []  Other: ____________  Laterality: []  Rt []  Lt [x]  Both  Check all possible CPT codes:  *CHOOSE 10 OR LESS*    []  97110 (Therapeutic Exercise)  []  92507 (SLP Treatment)  []  97112 (Neuro Re-ed)   []  92526 (Swallowing Treatment)   []  97116 (Gait Training)   []  [id number] (Cognitive Training, 1st 15 minutes) []   97140 (Manual Therapy)   []  97130 (Cognitive Training, each add'l 15 minutes)  []  97164 (Re-evaluation)                              []  Other, List CPT Code ____________  []  97530 (Therapeutic Activities)     []  97535 (Self Care)   [x]  All codes above (97110 - 97535)  []  97012 (Mechanical Traction)  []  56213 (E-stim Unattended)  []  97032 (E-stim manual)  []  97033 (Ionto)  []  97035 (Ultrasound) [x]  97750 (Physical Performance Training) [x]  [id number] (Aquatic Therapy) []  97016 (Vasopneumatic Device) []  [id number] (Paraffin) []  97034 (Contrast Bath) []  97597 (Wound Care 1st 20 sq cm) []  97598 (Wound Care each add'l 20 sq cm) []  97760 (Orthotic Fabrication, Fitting, Training Initial) []  [id number] (Prosthetic Management and Training Initial) []  [id number] (Orthotic or Prosthetic Training/ Modification Subsequent)

## 2022-07-15 ENCOUNTER — Ambulatory Visit: Payer: Medicare PPO | Attending: Orthopedic Surgery | Admitting: Physical Therapy

## 2022-07-15 ENCOUNTER — Encounter: Payer: Self-pay | Admitting: Physical Therapy

## 2022-07-15 DIAGNOSIS — M546 Pain in thoracic spine: Secondary | ICD-10-CM | POA: Insufficient documentation

## 2022-07-15 DIAGNOSIS — M5459 Other low back pain: Secondary | ICD-10-CM | POA: Diagnosis not present

## 2022-07-16 ENCOUNTER — Ambulatory Visit: Payer: Medicare PPO | Admitting: Physical Therapy

## 2022-07-18 DIAGNOSIS — K8020 Calculus of gallbladder without cholecystitis without obstruction: Secondary | ICD-10-CM | POA: Diagnosis not present

## 2022-07-21 NOTE — Therapy (Signed)
OUTPATIENT PHYSICAL THERAPY TREATMENT   Patient Name: Sarah Evans MRN: [id number] DOB:11/25/1980, 42 y.o., female Today's Date: 07/22/2022  END OF SESSION:  PT End of Session - 07/22/22 1538     Visit Number 3    Number of Visits 9    Date for PT Re-Evaluation 08/25/22    Authorization Type [employer] medicare/medicaid    Authorization Time Period 8 visits07/08/24-09/07/24    Authorization - Visit Number 2    Authorization - Number of Visits 8    Progress Note Due on Visit 10    PT Start Time 1539   late check in   PT Stop Time 1605    PT Time Calculation (min) 26 min    Activity Tolerance Other (comment)   see assessment below   Behavior During Therapy WFL for tasks assessed/performed               Past Medical History:  Diagnosis Date   Bipolar 1 disorder (HCC)    Herpes simplex virus (HSV) infection    dx 2001   Polycystic ovary disease    PTSD (post-traumatic stress disorder)    Schizophrenia (HCC)    Stroke [facility])    Past Surgical History:  Procedure Laterality Date   BUBBLE STUDY  05/25/2020   Procedure: BUBBLE STUDY;  Surgeon: Chrystie Nose, MD;  Location: [facility] ENDOSCOPY;  Service: Cardiovascular;;   NO PAST SURGERIES     TEE WITHOUT CARDIOVERSION N/A 05/25/2020   Procedure: TRANSESOPHAGEAL ECHOCARDIOGRAM (TEE);  Surgeon: Chrystie Nose, MD;  Location: [facility] ENDOSCOPY;  Service: Cardiovascular;  Laterality: N/A;   Patient Active Problem List   Diagnosis Date Noted   Chronic pain of both knees 07/24/2021   Sleep apnea 06/20/2021   Cholelithiasis 06/20/2021   History of PCOS 05/30/2021   Hirsutism 05/30/2021   Chronic neck pain 11/01/2020   Chronic thoracic spine pain 10/25/2020   GERD without esophagitis 05/27/2020   Numbness and tingling 05/24/2020   History of stroke 05/23/2020   BMI 29.0-29.9,adult 05/21/2020   HSV (herpes simplex virus) anogenital infection 05/21/2020   Prediabetes 04/12/2020   Intrinsic eczema 04/12/2020   Mixed hyperlipidemia  04/12/2020   Muscle spasticity 04/12/2020   Moderate episode of recurrent major depressive disorder (HCC) 06/09/2019   Generalized anxiety disorder 06/09/2019   Personal history of nonsuicidal self-harm 04/28/2018   PTSD (post-traumatic stress disorder) 04/30/2015   Schizoaffective disorder, bipolar type (HCC) 04/27/2015    PCP: Storm Frisk, MD  REFERRING PROVIDER: London Sheer, MD  REFERRING DIAG: M54.50,G89.29 (ICD-10-CM) - Chronic bilateral low back pain, unspecified whether sciatica present  Rationale for Evaluation and Treatment: Rehabilitation  THERAPY DIAG:  Pain in thoracic spine  Other low back pain  ONSET DATE: since adolescence  SUBJECTIVE:  Per eval - Pt states she has had back pain since she was 15, sharp/shooting pain along mid-low back. Tends to fluctuate with activity/stress and improve with rest. Waking up up to 3x/night due to pain, positional. No UE referral (into posterior shoulders), no LE pain. Describes some tingling in midback at times. Difficulty standing and lifting (laundry). States symptoms have been fairly consistent since onset as a teenager, no progressive symptoms. 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 Seems a bit better now that she's not working. Does report some LUE weakness since her stroke. No bowel/bladder symptoms, she voices some increased urgency about a year ago but stable.   SUBJECTIVE STATEMENT: 07/22/2022 Pt arrives without pain, states she felt good after last session and hasn't really had any pain over weekend. Reports good HEP adherence.    PERTINENT HISTORY:  GERD, sleep apnea, schizoaffective disorder/bipolar, PTSD, anxiety/depression, history of stroke  PAIN:  Are you having pain: 0/10   Location/description: mid - low back and posterior shoulders   Per eval -  Best-worst over past week: 0-10/10  - aggravating factors: standing 15-30 min, lifting (laundry basket and trash bags), running too many errands in one day, stress - Easing factors: medication, rest    PRECAUTIONS: hx stroke, psycho-social hx  WEIGHT BEARING RESTRICTIONS: No  FALLS:  Has patient fallen in last 6 months? No  LIVING ENVIRONMENT: Apartment, no stairs, lives alone with her parakeet  OCCUPATION: social security disability  PLOF: Independent  PATIENT GOALS: would like to   NEXT MD VISIT: Unsure   OBJECTIVE: (objective measures completed at initial evaluation unless otherwise dated)   DIAGNOSTIC FINDINGS:  05/30/22 lumbar and cervical XR, refer to EPIC for details  PATIENT SURVEYS:  FOTO 31 current, 45 predicted  SCREENING FOR RED FLAGS: Red flag questioning/screening reassuring    COGNITION: Overall cognitive status: Within functional limits for tasks assessed     SENSATION: Denies sensory complaints  POSTURE: increased lumbar lordosis, slightly elevated UT BIL  PALPATION: Deferred given time constraints  LUMBAR ROM:   AROM eval 07/22/22  Flexion Mid shin LE tightness    Extension 75% pain   Right lateral flexion    Left lateral flexion    Right rotation 100% 100%   Left rotation 75% R sided pain  75% painless   (Blank rows = not tested) (Key: WFL = within functional limits not formally assessed, * = concordant pain, s = stiffness/stretching sensation, NT = not tested)   RANGE OF MOTION:     Active  Right eval Left eval  Shoulder flexion Mildly limited, symmetrical Mildly limited, symmetrical   Functional ER combo Scap spine Scap spine  Functional IR combo    Knee extension    Ankle dorsiflexion     (Blank rows = not tested) (Key: WFL = within functional limits not formally assessed, * = concordant pain, s = stiffness/stretching sensation, NT = not tested)   Comments: mild midback pain with both of above movements, mild reduction in LUE velocity pt attributes to hx of stroke but grossly symmetrical ROM   LOWER EXTREMITY MMT:    MMT Right eval Left eval  Hip flexion    Hip abduction (modified sitting)    Hip internal rotation    Hip external rotation    Knee flexion    Knee extension    Ankle dorsiflexion     (Blank rows = not tested) (Key: WFL = within functional limits not formally assessed, * =  concordant pain, s = stiffness/stretching sensation, NT = not tested)  Comments:    LUMBAR SPECIAL TESTS:  deferred  FUNCTIONAL TESTS:  Deferred given time constraints  GAIT: Distance walked: within clinic Assistive device utilized: None Level of assistance: Complete Independence Comments: reduced step length BIL, reduced trunk rotation  Vitals on eval: BP 101/74 HR 105-108bpm (manual and pulse ox) SpO2 99% on RA. Denies any other symptoms (see assessment below)  Vitals 07/15/22: HR 92-96bpm SpO2 98% RA   Vitals 07/22/22: 98-103bpm, SpO2 100%, 99/82 initially and 99/79 with recheck L arm Describes vague chest discomfort when doing lumbar extension that persists for a few minutes, vitals as above. Pt states this is chronic (intermittently over past few years, tends to resolve after a couple minutes). See assessment below  TODAY'S TREATMENT:                                                                                                                              [facility] Adult PT Treatment:                                                DATE: 07/22/22 Therapeutic Exercise: Seated swiss ball sidebending isos 2x12 BIL cues for posture and breath control Deferred further exercise, see assessment  Therapeutic Activity: Education/discussion re: progress with PT thus far, lumbar ROM, symptom behavior over weekend, PT goals/POC   [facility] Adult PT Treatment:                                                DATE: 07/15/22 Therapeutic  Exercise: Seated sidebending iso into swiss ball 2x8 each way cues for setup and breath control  Seated adduction iso with ball 2x10 cues for form and posture  LTR x8 each direction cues for comfortable ROM and breath control Supine pelvic tilt x10 cues for breath control and comfortable ROM Seated swiss ball flexion rollout x10 cues for comfortable ROM  HEP update + education/handout   PATIENT EDUCATION:  Education details: rationale for interventions, HEP, see assessment below Person educated: Patient Education method: Explanation, Demonstration, Tactile cues, Verbal cues, and Handouts Education comprehension: verbalized understanding, returned demonstration, verbal cues required, tactile cues required, and needs further education    HOME EXERCISE PROGRAM: Access Code: [email or web address] URL: [email or web address] Date: 07/15/2022 Prepared by: Fransisco Hertz  Exercises - Supine Posterior Pelvic Tilt  - 2-3 x daily - 7 x weekly - 1 sets - 10 reps - Seated Hip Adduction Isometrics with Ball  - 2-3 x daily - 7 x weekly - 1 sets - 10 reps - Seated Flexion Stretch  - 2-3 x daily - 7 x weekly - 1 sets - 10 reps  ASSESSMENT:  CLINICAL IMPRESSION: 07/22/2022 Pt arrived w/o pain, stated she felt good after last session and hasn't really had any pain over the weekend. Session began with progression of volume for lateral core training which pt tolerated well. In discussion with pt re: her symptom improvement since last session, looked at her lumbar ROM which was improved compared to start of care with reduced pain. However, with lumbar extension pt endorsed some discomfort in her chest that persisted after cessation of movement. Symptom descriptions were vague, she denied overt pain but did endorse some mild pressure that improved over time. Vitals taken as above, BP soft but stable. She denied any sharp pain, dizziness/lightheadedness, headache, or other adverse symptoms. In discussion with  pt, she stated these symptoms have actually been occurring over past couple of years, tend to persist for a couple minutes and then resolve. Symptoms resolved completely after a couple of minutes and vitals remained consistent. She stated that she has had a cardiac work up before but does not know if she has mentioned these specific symptoms to her doctor. Advised her to notify her PCP on her way out today to seek their opinion on appropriate action for this episode, and if unable to reach them for their input it would be wise to present to urgent care/ED out of abundance of caution. She verbalized agreement/understanding, departed clinic in no acute distress with report of resolved symptoms.   Per eval - Pt is a pleasant 42 year old woman who arrives to PT evaluation on this date for chronic back pain. Pt reports difficulty with prolonged activities and lifting due to pain. During today's session pt demonstrates limitations in thoracolumbar mobility which are likely contributing to difficulty with aforementioned activities. Exam is limited due to time constraints with late check in, as well as pt reporting "feeling like I need to pant" (referring to breathing) with ROM exam. Vitals are checked and grossly WNL aside from mild tachycardia (see above), difficulty describing symptoms further but overtly denies any red flag symptoms. Education is provided on red flag symptoms and criteria for presenting to ED/EMS, pt verbalizes understanding. No adverse events, pt notes her muscles feel a bit better after movement during exam but continues with about the same amount of pain. Recommend skilled PT to address aforementioned deficits to improve functional independence/tolerance. Pt departs today's session in no acute distress, all voiced questions/concerns addressed appropriately from PT perspective.    OBJECTIVE IMPAIRMENTS: decreased activity tolerance, decreased endurance, decreased mobility, difficulty walking,  decreased ROM, decreased strength, impaired UE functional use, improper body mechanics, postural dysfunction, and pain.   ACTIVITY LIMITATIONS: carrying, lifting, bending, standing, sleeping, reach over head, and locomotion level  PARTICIPATION LIMITATIONS: meal prep, cleaning, laundry, community activity, and occupation  PERSONAL FACTORS: Time since onset of injury/illness/exacerbation and 3+ comorbidities: hx stroke, pysch-social comorbidities  are also affecting patient's functional outcome.   REHAB POTENTIAL: Fair given chronicity and comorbidities  CLINICAL DECISION MAKING: Stable/uncomplicated  EVALUATION COMPLEXITY: Low   GOALS: Goals reviewed with patient? No given time constraints  SHORT TERM GOALS: Target date: 07/28/2022 Pt will demonstrate appropriate understanding and performance of initially prescribed HEP in order to facilitate improved independence with management of symptoms.  Baseline: HEP TBD Goal status: INITIAL   2. Pt will score greater than or equal to 38 on FOTO in order to demonstrate improved perception of function due to symptoms.  Baseline: 31  Goal status: INITIAL    LONG TERM GOALS: Target date: 08/25/2022 Pt will  score 45 on FOTO in order to demonstrate improved perception of functional status due to symptoms.  Baseline: 31 Goal status: INITIAL  2.  Pt will demonstrate grossly symmetrical and painless lumbar rotation AROM in order to demonstrate improved tolerance to functional movement patterns.  Baseline: see ROM chart above Goal status: INITIAL  3.  Pt will be able to lift/carry up to 10# with less than 3 pt increase in resting pain on NPS in order to improve tolerance to household tasks such as laundry.  Baseline: pain w/ laundry  Goal status: INITIAL  4. Pt will report ability to stand/walk for up to 30 min in order to improve tolerance to household tasks such as cleaning/dishes.   Baseline: pain with dishes and other housework  Goal status:  INITIAL   5. Pt will endorse at least 50% improvement in frequency of nighttime waking due to pain in order to improve overall health and QOL.   Baseline: waking up up to 3x/night  Goal status: INITIAL  6. Pt will report at least 50% decrease in overall pain levels in past week in order to facilitate improved tolerance to basic ADLs/mobility.   Baseline: 0-10/10 over past week  Goal status: INITIAL    PLAN:  PT FREQUENCY: 1x/week  PT DURATION: 8 weeks  PLANNED INTERVENTIONS: Therapeutic exercises, Therapeutic activity, Neuromuscular re-education, Balance training, Gait training, Patient/Family education, Self Care, Joint mobilization, Stair training, Aquatic Therapy, Dry Needling, Spinal mobilization, Cryotherapy, Moist heat, Taping, Manual therapy, and Re-evaluation.  PLAN FOR NEXT SESSION: monitor vitals/symptoms as indicated. Assess symptom progression and work on core/hip stability as able/tolerated.    Ashley Murrain PT, DPT 07/22/2022 4:11 PM    Referring diagnosis? M54.50,G89.29 (ICD-10-CM) - Chronic bilateral low back pain, unspecified whether sciatica present Treatment diagnosis? (if different than referring diagnosis)  Pain in thoracic spine  Other low back pain  What was this (referring dx) caused by? []  Surgery []  Fall [x]  Ongoing issue []  Arthritis []  Other: ____________  Laterality: []  Rt []  Lt [x]  Both  Check all possible CPT codes:  *CHOOSE 10 OR LESS*    []  97110 (Therapeutic Exercise)  []  92507 (SLP Treatment)  []  97112 (Neuro Re-ed)   []  92526 (Swallowing Treatment)   []  64403 (Gait Training)   []  [id number] (Cognitive Training, 1st 15 minutes) []  97140 (Manual Therapy)   []  97130 (Cognitive Training, each add'l 15 minutes)  []  97164 (Re-evaluation)                              []  Other, List CPT Code ____________  []  47425 (Therapeutic Activities)     []  97535 (Self Care)   [x]  All codes above (97110 - 97535)  []  97012 (Mechanical Traction)  []   97014 (E-stim Unattended)  []  97032 (E-stim manual)  []  97033 (Ionto)  []  97035 (Ultrasound) [x]  97750 (Physical Performance Training) [x]  [id number] (Aquatic Therapy) []  97016 (Vasopneumatic Device) []  [id number] (Paraffin) []  97034 (Contrast Bath) []  97597 (Wound Care 1st 20 sq cm) []  97598 (Wound Care each add'l 20 sq cm) []  97760 (Orthotic Fabrication, Fitting, Training Initial) []  [id number] (Prosthetic Management and Training Initial) []  [id number] (Orthotic or Prosthetic Training/ Modification Subsequent)

## 2022-07-22 ENCOUNTER — Ambulatory Visit: Payer: Medicare PPO | Admitting: Physical Therapy

## 2022-07-22 ENCOUNTER — Telehealth: Payer: Self-pay | Admitting: Critical Care Medicine

## 2022-07-22 ENCOUNTER — Encounter: Payer: Self-pay | Admitting: Physical Therapy

## 2022-07-22 DIAGNOSIS — M5459 Other low back pain: Secondary | ICD-10-CM | POA: Diagnosis not present

## 2022-07-22 DIAGNOSIS — M546 Pain in thoracic spine: Secondary | ICD-10-CM

## 2022-07-22 NOTE — Telephone Encounter (Signed)
Patient has not been seen in 1 year and is requesting muscle relaxant medication. Will have to keep upcoming appt for more refills.

## 2022-07-22 NOTE — Telephone Encounter (Signed)
Medication Refill - Medication: tiZANidine (ZANAFLEX) 4 MG tablet   Has the patient contacted their pharmacy? No. (Agent: If no, request that the patient contact the pharmacy for the refill. If patient does not wish to contact the pharmacy document the reason why and proceed with request.) (Agent: If yes, when and what did the pharmacy advise?)  Preferred Pharmacy (with phone number or street name):  [id number] [place], [place] - [address] AT [facility]  [address] [place] [address]  Phone: [phone] Fax: [phone]  Hours: Not open 24 hours   Has the patient been seen for an appointment in the last year OR does the patient have an upcoming appointment? Yes.    Agent: Please be advised that RX refills may take up to 3 business days. We ask that you follow-up with your pharmacy.'

## 2022-07-23 ENCOUNTER — Ambulatory Visit: Payer: Medicare PPO | Admitting: Physical Therapy

## 2022-07-25 ENCOUNTER — Other Ambulatory Visit: Payer: Self-pay

## 2022-07-25 ENCOUNTER — Other Ambulatory Visit: Payer: Self-pay | Admitting: Critical Care Medicine

## 2022-07-25 DIAGNOSIS — A6004 Herpesviral vulvovaginitis: Secondary | ICD-10-CM

## 2022-07-25 DIAGNOSIS — L089 Local infection of the skin and subcutaneous tissue, unspecified: Secondary | ICD-10-CM | POA: Diagnosis not present

## 2022-07-25 DIAGNOSIS — L658 Other specified nonscarring hair loss: Secondary | ICD-10-CM | POA: Diagnosis not present

## 2022-07-25 DIAGNOSIS — K219 Gastro-esophageal reflux disease without esophagitis: Secondary | ICD-10-CM

## 2022-07-25 DIAGNOSIS — L810 Postinflammatory hyperpigmentation: Secondary | ICD-10-CM | POA: Diagnosis not present

## 2022-07-25 DIAGNOSIS — L680 Hirsutism: Secondary | ICD-10-CM | POA: Diagnosis not present

## 2022-07-25 DIAGNOSIS — E282 Polycystic ovarian syndrome: Secondary | ICD-10-CM | POA: Diagnosis not present

## 2022-07-29 ENCOUNTER — Encounter: Payer: Self-pay | Admitting: Pharmacist

## 2022-07-29 ENCOUNTER — Other Ambulatory Visit: Payer: Self-pay

## 2022-07-29 ENCOUNTER — Encounter: Payer: Self-pay | Admitting: Physical Therapy

## 2022-07-29 ENCOUNTER — Ambulatory Visit: Payer: Medicare PPO | Admitting: Physical Therapy

## 2022-07-29 DIAGNOSIS — M546 Pain in thoracic spine: Secondary | ICD-10-CM

## 2022-07-29 DIAGNOSIS — M5459 Other low back pain: Secondary | ICD-10-CM

## 2022-07-29 NOTE — Therapy (Addendum)
OUTPATIENT PHYSICAL THERAPY TREATMENT + NO VISIT DISCHARGE SUMMARY (see below)    Patient Name: Sarah Evans MRN: [id number] DOB:[birth year], 42 y.o., female Today's Date: 07/29/2022  END OF SESSION:  PT End of Session - 07/29/22 1533     Visit Number 4    Number of Visits 9    Date for PT Re-Evaluation 08/25/22    Authorization Type [employer] medicare/medicaid    Authorization Time Period 8 visits07/08/24-09/07/24    Authorization - Visit Number 3    Authorization - Number of Visits 8    Progress Note Due on Visit 10    PT Start Time 1534    PT Stop Time 1613    PT Time Calculation (min) 39 min    Activity Tolerance Patient tolerated treatment well    Behavior During Therapy WFL for tasks assessed/performed                Past Medical History:  Diagnosis Date   Bipolar 1 disorder (HCC)    Herpes simplex virus (HSV) infection    dx 2001   Polycystic ovary disease    PTSD (post-traumatic stress disorder)    Schizophrenia (HCC)    Stroke [facility])    Past Surgical History:  Procedure Laterality Date   BUBBLE STUDY  05/25/2020   Procedure: BUBBLE STUDY;  Surgeon: Chrystie Nose, MD;  Location: [facility] ENDOSCOPY;  Service: Cardiovascular;;   NO PAST SURGERIES     TEE WITHOUT CARDIOVERSION N/A 05/25/2020   Procedure: TRANSESOPHAGEAL ECHOCARDIOGRAM (TEE);  Surgeon: Chrystie Nose, MD;  Location: [facility] ENDOSCOPY;  Service: Cardiovascular;  Laterality: N/A;   Patient Active Problem List   Diagnosis Date Noted   Chronic pain of both knees 07/24/2021   Sleep apnea 06/20/2021   Cholelithiasis 06/20/2021   History of PCOS 05/30/2021   Hirsutism 05/30/2021   Chronic neck pain 11/01/2020   Chronic thoracic spine pain 10/25/2020   GERD without esophagitis 05/27/2020   Numbness and tingling 05/24/2020   History of stroke 05/23/2020   BMI 29.0-29.9,adult 05/21/2020   HSV (herpes simplex virus) anogenital infection 05/21/2020   Prediabetes 04/12/2020   Intrinsic eczema 04/12/2020    Mixed hyperlipidemia 04/12/2020   Muscle spasticity 04/12/2020   Moderate episode of recurrent major depressive disorder (HCC) 06/09/2019   Generalized anxiety disorder 06/09/2019   Personal history of nonsuicidal self-harm 04/28/2018   PTSD (post-traumatic stress disorder) 04/30/2015   Schizoaffective disorder, bipolar type (HCC) 04/27/2015    PCP: Storm Frisk, MD  REFERRING PROVIDER: London Sheer, MD  REFERRING DIAG: M54.50,G89.29 (ICD-10-CM) - Chronic bilateral low back pain, unspecified whether sciatica present  Rationale for Evaluation and Treatment: Rehabilitation  THERAPY DIAG:  Pain in thoracic spine  Other low back pain  ONSET DATE: since adolescence  SUBJECTIVE:  Per eval - Pt states she has had back pain since she was 15, sharp/shooting pain along mid-low back. Tends to fluctuate with activity/stress and improve with rest. Waking up up to 3x/night due to pain, positional. No UE referral (into posterior shoulders), no LE pain. Describes some tingling in midback at times. Difficulty standing and lifting (laundry). States symptoms have been fairly consistent since onset as a teenager, no progressive symptoms. 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 Seems a bit better now that she's not working. Does report some LUE weakness since her stroke. No bowel/bladder symptoms, she voices some increased urgency about a year ago but stable.   SUBJECTIVE STATEMENT: 07/29/2022 Pt arrives w/o pain, states she hasn't had much pain since last session. States she has not had any further episodes of chest discomfort - reached out to her doctor after last visit, hasn't had any work up or assessment yet but has an appt with them next week.   PERTINENT HISTORY:   GERD, sleep apnea, schizoaffective disorder/bipolar, PTSD, anxiety/depression, history of stroke  PAIN:  Are you having pain: 0/10  Location/description: mid - low back and posterior shoulders  Best-worst: 0-7/10 (when sitting for a long time)  Per eval -  Best-worst over past week: 0-10/10  - aggravating factors: standing 15-30 min, lifting (laundry basket and trash bags), running too many errands in one day, stress - Easing factors: medication, rest    PRECAUTIONS: hx stroke, psycho-social hx  WEIGHT BEARING RESTRICTIONS: No  FALLS:  Has patient fallen in last 6 months? No  LIVING ENVIRONMENT: Apartment, no stairs, lives alone with her parakeet  OCCUPATION: social security disability  PLOF: Independent  PATIENT GOALS: would like to   NEXT MD VISIT: Unsure   OBJECTIVE: (objective measures completed at initial evaluation unless otherwise dated)   DIAGNOSTIC FINDINGS:  05/30/22 lumbar and cervical XR, refer to EPIC for details  PATIENT SURVEYS:  FOTO 31 current, 45 predicted FOTO 07/29/22: 43   SCREENING FOR RED FLAGS: Red flag questioning/screening reassuring    COGNITION: Overall cognitive status: Within functional limits for tasks assessed     SENSATION: Denies sensory complaints  POSTURE: increased lumbar lordosis, slightly elevated UT BIL  PALPATION: Deferred given time constraints  LUMBAR ROM:   AROM eval 07/22/22  Flexion Mid shin LE tightness    Extension 75% pain   Right lateral flexion    Left lateral flexion    Right rotation 100% 100%   Left rotation 75% R sided pain  75% painless   (Blank rows = not tested) (Key: WFL = within functional limits not formally assessed, * = concordant pain, s = stiffness/stretching sensation, NT = not tested)   RANGE OF MOTION:     Active  Right eval Left eval  Shoulder flexion Mildly limited, symmetrical Mildly limited, symmetrical   Functional ER combo Scap spine Scap spine  Functional IR combo    Knee  extension    Ankle dorsiflexion     (Blank rows = not tested) (Key: WFL = within functional limits not formally assessed, * = concordant pain, s = stiffness/stretching sensation, NT = not tested)  Comments: mild midback pain with both of above movements, mild reduction in LUE velocity pt attributes to hx of stroke but grossly symmetrical ROM   LOWER EXTREMITY MMT:    MMT Right eval Left eval  Hip flexion    Hip abduction (modified sitting)    Hip internal rotation  Hip external rotation    Knee flexion    Knee extension    Ankle dorsiflexion     (Blank rows = not tested) (Key: WFL = within functional limits not formally assessed, * = concordant pain, s = stiffness/stretching sensation, NT = not tested)  Comments:    LUMBAR SPECIAL TESTS:  deferred  FUNCTIONAL TESTS:  Deferred given time constraints  GAIT: Distance walked: within clinic Assistive device utilized: None Level of assistance: Complete Independence Comments: reduced step length BIL, reduced trunk rotation  Vitals on eval: BP 101/74 HR 105-108bpm (manual and pulse ox) SpO2 99% on RA. Denies any other symptoms (see assessment below)  Vitals 07/15/22: HR 92-96bpm SpO2 98% RA   Vitals 07/22/22: 98-103bpm, SpO2 100%, 99/82 initially and 99/79 with recheck L arm Describes vague chest discomfort when doing lumbar extension that persists for a few minutes, vitals as above. Pt states this is chronic (intermittently over past few years, tends to resolve after a couple minutes). See assessment below  TODAY'S TREATMENT:                                                                                                                              [facility] Adult PT Treatment:                                                DATE: 07/29/22 Therapeutic Exercise: Scapular retraction 2x8 cues for posture and breath control  Swiss ball sidebending iso 2x8 BIL  Swiss ball fwd press down seated 2x8 BIL Seated fwd swiss ball rollout x10  cues for comfortable ROM  Seated lateral swiss ball rollout x8 cues for comfortable ROM  Supine pelvic tilts x12 Hooklying march w/ core contraction 2x8 each LE cues for breath control  LTR x10 BIL HEP update + education  [facility] Adult PT Treatment:                                                DATE: 07/22/22 Therapeutic Exercise: Seated swiss ball sidebending isos 2x12 BIL cues for posture and breath control Deferred further exercise, see assessment  Therapeutic Activity: Education/discussion re: progress with PT thus far, lumbar ROM, symptom behavior over weekend, PT goals/POC   [facility] Adult PT Treatment:                                                DATE: 07/15/22 Therapeutic Exercise: Seated sidebending iso into swiss ball 2x8 each way cues for setup and breath control  Seated adduction iso with ball 2x10 cues for  form and posture  LTR x8 each direction cues for comfortable ROM and breath control Supine pelvic tilt x10 cues for breath control and comfortable ROM Seated swiss ball flexion rollout x10 cues for comfortable ROM  HEP update + education/handout   PATIENT EDUCATION:  Education details: rationale for interventions, HEP Person educated: Patient Education method: Explanation, Demonstration, Tactile cues, Verbal cues, and Handouts Education comprehension: verbalized understanding, returned demonstration, verbal cues required, tactile cues required, and needs further education    HOME EXERCISE PROGRAM: Access Code: [email or web address] URL: [email or web address] Date: 07/29/2022 Prepared by: Fransisco Hertz  Exercises - Supine Posterior Pelvic Tilt  - 2-3 x daily - 7 x weekly - 1 sets - 10 reps - Supine March  - 2-3 x daily - 7 x weekly - 1 sets - 10 reps - Seated Hip Adduction Isometrics with Ball  - 2-3 x daily - 7 x weekly - 1 sets - 10 reps - Seated Flexion Stretch  - 2-3 x daily - 7 x weekly - 1 sets - 10 reps  ASSESSMENT:  CLINICAL IMPRESSION: 07/29/2022 Pt  arrives w/o back pain, denies any further episodes of chest discomfort since last session (see last session note for further description) - notes she contacted her doctor and has an appt with them scheduled next week. Today working on lumbopelvic mobility and postural endurance which pt tolerates well, reports feeling "great" at end of session. Monitoring tolerance/symptoms closely throughout, pt denies any adverse symptoms or pain today. Continued to educate on monitoring her episodes of chest discomfort, encouraged her to continue communicating with her doctor and if these symptoms occur again prior to seeing her doctor she should present to UC/ED for assessment - she verbalizes understanding. Departs without pain or adverse events, HEP update as above. Pt departs today's session in no acute distress, all voiced questions/concerns addressed appropriately from PT perspective.    Per eval - Pt is a pleasant 42 year old woman who arrives to PT evaluation on this date for chronic back pain. Pt reports difficulty with prolonged activities and lifting due to pain. During today's session pt demonstrates limitations in thoracolumbar mobility which are likely contributing to difficulty with aforementioned activities. Exam is limited due to time constraints with late check in, as well as pt reporting "feeling like I need to pant" (referring to breathing) with ROM exam. Vitals are checked and grossly WNL aside from mild tachycardia (see above), difficulty describing symptoms further but overtly denies any red flag symptoms. Education is provided on red flag symptoms and criteria for presenting to ED/EMS, pt verbalizes understanding. No adverse events, pt notes her muscles feel a bit better after movement during exam but continues with about the same amount of pain. Recommend skilled PT to address aforementioned deficits to improve functional independence/tolerance. Pt departs today's session in no acute distress, all  voiced questions/concerns addressed appropriately from PT perspective.    OBJECTIVE IMPAIRMENTS: decreased activity tolerance, decreased endurance, decreased mobility, difficulty walking, decreased ROM, decreased strength, impaired UE functional use, improper body mechanics, postural dysfunction, and pain.   ACTIVITY LIMITATIONS: carrying, lifting, bending, standing, sleeping, reach over head, and locomotion level  PARTICIPATION LIMITATIONS: meal prep, cleaning, laundry, community activity, and occupation  PERSONAL FACTORS: Time since onset of injury/illness/exacerbation and 3+ comorbidities: hx stroke, pysch-social comorbidities  are also affecting patient's functional outcome.   REHAB POTENTIAL: Fair given chronicity and comorbidities  CLINICAL DECISION MAKING: Stable/uncomplicated  EVALUATION COMPLEXITY: Low   GOALS: Goals reviewed with patient? No given time constraints  SHORT TERM GOALS: Target date: 07/28/2022 Pt will demonstrate appropriate understanding and performance of initially prescribed HEP in order to facilitate improved independence with management of symptoms.  Baseline: HEP TBD Goal status: INITIAL   2. Pt will score greater than or equal to 38 on FOTO in order to demonstrate improved perception of function due to symptoms.  Baseline: 31  Goal status: INITIAL    LONG TERM GOALS: Target date: 08/25/2022 Pt will score 45 on FOTO in order to demonstrate improved perception of functional status due to symptoms.  Baseline: 31 Goal status: INITIAL  2.  Pt will demonstrate grossly symmetrical and painless lumbar rotation AROM in order to demonstrate improved tolerance to functional movement patterns.  Baseline: see ROM chart above Goal status: INITIAL  3.  Pt will be able to lift/carry up to 10# with less than 3 pt increase in resting pain on NPS in order to improve tolerance to household tasks such as laundry.  Baseline: pain w/ laundry  Goal status: INITIAL  4. Pt  will report ability to stand/walk for up to 30 min in order to improve tolerance to household tasks such as cleaning/dishes.   Baseline: pain with dishes and other housework  Goal status: INITIAL   5. Pt will endorse at least 50% improvement in frequency of nighttime waking due to pain in order to improve overall health and QOL.   Baseline: waking up up to 3x/night  Goal status: INITIAL  6. Pt will report at least 50% decrease in overall pain levels in past week in order to facilitate improved tolerance to basic ADLs/mobility.   Baseline: 0-10/10 over past week  Goal status: INITIAL    PLAN:  PT FREQUENCY: 1x/week  PT DURATION: 8 weeks  PLANNED INTERVENTIONS: Therapeutic exercises, Therapeutic activity, Neuromuscular re-education, Balance training, Gait training, Patient/Family education, Self Care, Joint mobilization, Stair training, Aquatic Therapy, Dry Needling, Spinal mobilization, Cryotherapy, Moist heat, Taping, Manual therapy, and Re-evaluation.  PLAN FOR NEXT SESSION: monitor vitals/symptoms as indicated. Assess symptom progression and work on core/hip stability as able/tolerated.    Ashley Murrain PT, DPT 07/29/2022 4:44 PM    Referring diagnosis? M54.50,G89.29 (ICD-10-CM) - Chronic bilateral low back pain, unspecified whether sciatica present Treatment diagnosis? (if different than referring diagnosis)  Pain in thoracic spine  Other low back pain  What was this (referring dx) caused by? []  Surgery []  Fall [x]  Ongoing issue []  Arthritis []  Other: ____________  Laterality: []  Rt []  Lt [x]  Both  Check all possible CPT codes:  *CHOOSE 10 OR LESS*    []  97110 (Therapeutic Exercise)  []  16109 (SLP Treatment)  []  97112 (Neuro Re-ed)   []  92526 (Swallowing Treatment)   []  97116 (Gait Training)   []  [id number] (Cognitive Training, 1st 15 minutes) []  97140 (Manual Therapy)   []  97130 (Cognitive Training, each add'l 15 minutes)  []  97164 (Re-evaluation)                               []  Other, List CPT Code ____________  []  97530 (Therapeutic Activities)     []  97535 (Self Care)   [x]  All codes above (97110 - 97535)  []  97012 (Mechanical Traction)  []  97014 (E-stim Unattended)  []  97032 (E-stim manual)  []  97033 (Ionto)  []  97035 (Ultrasound) [x]  97750 (Physical Performance Training) [x]  [id number] (Aquatic Therapy) []  97016 (Vasopneumatic Device) []  [id number] (Paraffin) []  97034 (Contrast Bath) []  97597 (Wound Care 1st 20  sq cm) []  97598 (Wound Care each add'l 20 sq cm) []  97760 (Orthotic Fabrication, Fitting, Training Initial) []  [id number] (Prosthetic Management and Training Initial) []  [phone] (Orthotic or Prosthetic Training/ Modification Subsequent)      Discharge addendum 11/21/2022  PHYSICAL THERAPY DISCHARGE SUMMARY  Visits from Start of Care: 4  Current functional level related to goals / functional outcomes: Unable to be assessed   Remaining deficits: Unable to be assessed   Education / Equipment: Unable to be assessed  Patient goals were unable to be assessed. Patient is being discharged due to not returning since the last visit.     Ashley Murrain PT, DPT 11/21/2022 10:18 AM

## 2022-07-30 ENCOUNTER — Encounter: Payer: Medicare PPO | Admitting: Physical Therapy

## 2022-07-31 ENCOUNTER — Encounter: Payer: Self-pay | Admitting: Critical Care Medicine

## 2022-07-31 ENCOUNTER — Other Ambulatory Visit: Payer: Self-pay | Admitting: Critical Care Medicine

## 2022-07-31 DIAGNOSIS — K219 Gastro-esophageal reflux disease without esophagitis: Secondary | ICD-10-CM

## 2022-08-01 ENCOUNTER — Other Ambulatory Visit: Payer: Self-pay

## 2022-08-01 MED ORDER — OZEMPIC (2 MG/DOSE) 8 MG/3ML ~~LOC~~ SOPN
2.0000 mg | PEN_INJECTOR | SUBCUTANEOUS | 0 refills | Status: DC
  Filled 2022-08-01: qty 3, 28d supply, fill #0

## 2022-08-01 MED ORDER — PANTOPRAZOLE SODIUM 20 MG PO TBEC
20.0000 mg | DELAYED_RELEASE_TABLET | Freq: Every day | ORAL | 0 refills | Status: DC
Start: 2022-08-01 — End: 2022-08-15
  Filled 2022-08-01: qty 30, 30d supply, fill #0

## 2022-08-01 NOTE — Telephone Encounter (Signed)
Requested medication (s) are due for refill today:   Requested medication (s) are on the active medication list: Yes  Last refill:    Future visit scheduled: Yes  Notes to clinic:  Has appointment scheduled.    Requested Prescriptions  Pending Prescriptions Disp Refills   pantoprazole (PROTONIX) 20 MG tablet 30 tablet 1    Sig: Take 1 tablet (20 mg total) by mouth once daily.     Gastroenterology: Proton Pump Inhibitors Failed - 07/31/2022  8:44 PM      Failed - Valid encounter within last 12 months    Recent Outpatient Visits           1 year ago Chronic pain of both knees   [facility] Storm Frisk, MD   1 year ago Prediabetes   [facility] Storm Frisk, MD   1 year ago Obesity (BMI 30-39.9)   [place] [place], [place], NP   1 year ago History of ischemic stroke   [facility] Storm Frisk, MD   1 year ago Chronic thoracic back pain, unspecified back pain laterality   [facility] [place], Kasandra Knudsen, [place]       Future Appointments             In 6 days Storm Frisk, MD [facility]   In 1 month [place], Alben Spittle, MD [facility], 2 MG/DOSE, (OZEMPIC, 2 MG/DOSE,) 8 MG/3ML SOPN 3 mL 0    Sig: Inject 2 mg as directed once a week. Must have office visit for refills     Endocrinology:  Diabetes - GLP-1 Receptor Agonists - semaglutide Failed - 07/31/2022  8:44 PM      Failed - HBA1C in normal range and within 180 days    Hgb A1c MFr Bld  Date Value Ref Range Status  07/17/2021 5.8 (H) 4.8 - 5.6 % Final    Comment:             Prediabetes: 5.7 - 6.4          Diabetes: >6.4          Glycemic control for adults with diabetes: <7.0          Failed - Valid encounter within last 6 months     Recent Outpatient Visits           1 year ago Chronic pain of both knees   [facility] Storm Frisk, MD   1 year ago Prediabetes   [facility] Storm Frisk, MD   1 year ago Obesity (BMI 30-39.9)   [place] [facility] [place], Shea Stakes, NP   1 year ago History of ischemic stroke   [facility] Storm Frisk, MD   1 year ago Chronic thoracic back pain, unspecified back pain laterality   [facility] [place], Kasandra Knudsen, [place]       Future Appointments             In 6 days Storm Frisk, MD [facility]   In 1 month  Maisie Fus, MD [place] [facility] in normal range and within 360 days    Creatinine, Ser  Date Value Ref Range Status  06/16/2022 0.90 0.44 - 1.00 mg/dL Final

## 2022-08-05 ENCOUNTER — Other Ambulatory Visit: Payer: Self-pay

## 2022-08-05 ENCOUNTER — Ambulatory Visit: Payer: Medicare PPO | Admitting: Physical Therapy

## 2022-08-06 ENCOUNTER — Encounter: Payer: Medicare PPO | Admitting: Physical Therapy

## 2022-08-07 ENCOUNTER — Telehealth: Payer: Medicare PPO | Admitting: Critical Care Medicine

## 2022-08-10 NOTE — Progress Notes (Deleted)
Complete physical exam  Patient: Sarah Evans   DOB: Jun 29, 1980   42 y.o. Female  MRN: [id number]  Subjective:    No chief complaint on file.   Sarah Evans is a 42 y.o. female who presents today for a complete physical exam. She reports consuming a {diet types:17450} diet. {types:19826} She generally feels {DESC; WELL/FAIRLY WELL/POORLY:18703}. She reports sleeping {DESC; WELL/FAIRLY WELL/POORLY:18703}. She {does/does not:200015} have additional problems to discuss today.    HPI 10/2020 Sarah Evans presents for assistance with filling out mental and physical residual functional capacity assessments.  The patient had a significant stroke earlier this year leaving the patient with left upper extremity weakness and pain.  She also has chronic midthoracic and neck pain as well.  Patient has hyperlipidemia with type 2 diabetes and severe bipolar disorder and severe depression.  She has had psychotic features but not full-blown schizophrenia.  Despite treatment with mental health her depression and bipolar disorder have been significantly impacting her abilities.    The patient has not been able to perform her job and has been out of work for some time.  She sits at home is not able to engage in any social interactions.  Note she is not suicidal.  She was not able to focus on her job and the back pain and left upper extremity weakness and pain have created a situation where she drops objects frequently.  She also has left leg weakness cannot walk.  She has chronic headaches dizziness and feels faint.  The patient also has visual acuity issues in the left eye after the stroke  On arrival blood pressure is good 118/80.  Her diabetes has been reasonably controlled on metformin alone.  She is on statin therapy along with medication for blood pressure.  Minimal residual functional assessment is done as outlined below and per the forms functional residual capacity also is done per forms  below.  6/15   This patient is seen today by way of a video visit.  She is in her bedroom at home.  She is in no distress on the video.  Her chief complaint is that of increased weight gain and considering potential Ozempic.  Unfortunately she only has family-planning Medicaid.  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  She only walks about 3000 steps a day.  She eats a lot of vegetables in her diet.  Her weight has increased and her BMI is 30.  Patient does have prediabetes.  A1c has been elevated in the past needs to be rechecked.  Patient's had previous stroke and is on statin therapy.  Was in the ER in May for abdominal pain found to have cholelithiasis.  She was to follow-up with general surgery for potential resection but this is yet to occur.  Patient also has bipolar disorder and needs refills on her Seroquel.  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  There are no other complaints. See Epworth scale total score is 15 the patient was given Norco in the emergency room in May she still has a bottle of medicine and takes it rarely Patient had a Pap smear in May was normal  07/24/21 This patient is seen in return follow-up and notes that her mental health is improved on her current program and [facility].  She  now does have the [employer] discount available.  Also has the orange card.  On arrival blood pressure 107/70.  She still having heavy menstrual periods and would like a prescription for birth control medications.  We have sent that to our pharmacy downstairs.  Patient also saw gynecology earlier this year and had a normal Pap smear.  She is also on Aldactone for PCOS.  Patient still has hirsutism despite the Aldactone.  Patient is interested in topical therapy for hirsutism.  Patient complains of bilateral knee p/6  8/6 Most recent fall risk  assessment:    09/03/2018    9:52 AM  Fall Risk   Falls in the past year? 0  Number falls in past yr: 0  Injury with Fall? 0     Most recent depression screenings:    07/08/2022    3:15 PM 02/19/2022    2:03 PM  PHQ 2/9 Scores  PHQ - 2 Score  0  PHQ- 9 Score  0     Information is confidential and restricted. Go to Review Flowsheets to unlock data.    {VISON DENTAL STD PSA (Optional):27386}  {History (Optional):23778}  Patient Care Team: Storm Frisk, MD as PCP - General (Pulmonary Disease)   Outpatient Medications Prior to Visit  Medication Sig   acetaminophen (TYLENOL) 500 MG tablet Take 1,000 mg by mouth every 6 (six) hours as needed for moderate pain.   APAP-Pamabrom-Pyrilamine (PAMPRIN MAX PAIN FORMULA PO) Take 2 tablets by mouth every 6 (six) hours as needed (menstrual cramps).   atorvastatin (LIPITOR) 10 MG tablet Take 1 tablet (10 mg total) by mouth daily.   clobetasol cream (TEMOVATE) 0.05 % Apply 1 application topically to the affected area(s) 2 (two) times daily.   diclofenac Sodium (VOLTAREN) 1 % GEL Apply 2 g topically 4 (four) times daily.   dicyclomine (BENTYL) 20 MG tablet Take 1 tablet (20 mg total) by mouth 2 (two) times daily as needed for spasms.   Eflornithine HCl (VANIQA) 13.9 % cream Apply to face twice daily as needed (Patient not taking: Reported on 02/19/2022)   ketoconazole (NIZORAL) 2 % shampoo Apply 1 Application topically 2 (two) times a week.   meloxicam (MOBIC) 7.5 MG tablet Take 1 tablet (7.5 mg total) by mouth 2 (two) times daily as needed for pain.   metFORMIN (GLUCOPHAGE) 500 MG tablet Take 1 tablet (500 mg total) by mouth once daily with breakfast.   norgestimate-ethinyl estradiol (MILI) 0.25-35 MG-MCG tablet Take 1 tablet by mouth once daily. (Patient not taking: Reported on 02/19/2022)   ondansetron (ZOFRAN) 4 MG tablet Take 1 tablet (4 mg total) by mouth every 4 (four) hours as needed for nausea or vomiting.   pantoprazole (PROTONIX) 20  MG tablet Take 1 tablet (20 mg total) by mouth once daily.   prazosin (MINIPRESS) 2 MG capsule Take 1 capsule (2 mg total) by mouth once nightly at bedtime.   QUEtiapine (SEROQUEL) 50 MG tablet Take 1 tablet (50 mg total) by mouth once daily.   QUEtiapine Fumarate (SEROQUEL XR) 200 MG 24 hr tablet Take 1 tablet (200 mg total) by mouth at bedtime.   Semaglutide, 2 MG/DOSE, (OZEMPIC, 2 MG/DOSE,) 8 MG/3ML SOPN Inject 2 mg as directed once a week. Must have office visit for refills   spironolactone (ALDACTONE) 100 MG tablet take 1 tablet by mouth twice daily   valACYclovir (VALTREX) 1000 MG tablet Take 1 tablet (1,000 mg total) by mouth once daily.   zolpidem (AMBIEN) 5 MG  tablet Take 1 tablet (5 mg total) by mouth at bedtime as needed for sleep.   No facility-administered medications prior to visit.    ROS        Objective:     There were no vitals taken for this visit. {Vitals History (Optional):23777}  Physical Exam   No results found for any visits on 08/12/22. {Show previous labs (optional):23779}    Assessment & Plan:    Routine Health Maintenance and Physical Exam  Immunization History  Administered Date(s) Administered   COVID-19, mRNA, vaccine(Comirnaty)12 years and older 01/03/2022   Influenza Inj Mdck Quad Pf 01/07/2018   Influenza, Seasonal, Injecte, Preservative Fre 11/27/2015   Influenza,inj,Quad PF,6+ Mos 10/26/2018, 11/01/2020   Influenza,inj,quad, With Preservative 10/14/2016   PFIZER Comirnaty(Gray Top)Covid-19 Tri-Sucrose Vaccine 03/19/2020   PFIZER(Purple Top)SARS-COV-2 Vaccination 06/10/2019, 07/12/2019   PPD Test 01/03/2014   Pfizer Covid-19 Vaccine Bivalent Booster 38yrs & up 09/26/2020   Tdap 11/28/2015, 01/07/2018    Health Maintenance  Topic Date Due   Medicare Annual Wellness (AWV)  Never done   OPHTHALMOLOGY EXAM  Never done   Diabetic kidney evaluation - Urine ACR  05/21/2021   HEMOGLOBIN A1C  01/17/2022   FOOT EXAM  07/25/2022    INFLUENZA VACCINE  08/07/2022   Diabetic kidney evaluation - eGFR measurement  06/16/2023   PAP SMEAR-Modifier  05/31/2026   DTaP/Tdap/Td (3 - Td or Tdap) 01/08/2028   COVID-19 Vaccine  Completed   HIV Screening  Completed   HPV VACCINES  Aged Out   Hepatitis C Screening  Discontinued    Discussed health benefits of physical activity, and encouraged her to engage in regular exercise appropriate for her age and condition.  Problem List Items Addressed This Visit   None  No follow-ups on file.     Shan Levans, MD

## 2022-08-12 ENCOUNTER — Other Ambulatory Visit (INDEPENDENT_AMBULATORY_CARE_PROVIDER_SITE_OTHER): Payer: Medicare PPO

## 2022-08-12 ENCOUNTER — Ambulatory Visit (INDEPENDENT_AMBULATORY_CARE_PROVIDER_SITE_OTHER): Payer: Medicare PPO | Admitting: Orthopaedic Surgery

## 2022-08-12 ENCOUNTER — Encounter: Payer: Medicare Other | Admitting: Critical Care Medicine

## 2022-08-12 ENCOUNTER — Encounter: Payer: Self-pay | Admitting: Orthopaedic Surgery

## 2022-08-12 DIAGNOSIS — G8929 Other chronic pain: Secondary | ICD-10-CM

## 2022-08-12 DIAGNOSIS — M25562 Pain in left knee: Secondary | ICD-10-CM

## 2022-08-12 DIAGNOSIS — M25561 Pain in right knee: Secondary | ICD-10-CM

## 2022-08-12 DIAGNOSIS — F250 Schizoaffective disorder, bipolar type: Secondary | ICD-10-CM | POA: Diagnosis not present

## 2022-08-12 MED ORDER — BUPIVACAINE HCL 0.5 % IJ SOLN
2.0000 mL | INTRAMUSCULAR | Status: AC | PRN
Start: 2022-08-12 — End: 2022-08-12
  Administered 2022-08-12: 2 mL via INTRA_ARTICULAR

## 2022-08-12 MED ORDER — METHYLPREDNISOLONE ACETATE 40 MG/ML IJ SUSP
40.0000 mg | INTRAMUSCULAR | Status: AC | PRN
Start: 2022-08-12 — End: 2022-08-12
  Administered 2022-08-12: 40 mg via INTRA_ARTICULAR

## 2022-08-12 MED ORDER — LIDOCAINE HCL 1 % IJ SOLN
2.0000 mL | INTRAMUSCULAR | Status: AC | PRN
Start: 2022-08-12 — End: 2022-08-12
  Administered 2022-08-12: 2 mL

## 2022-08-12 NOTE — Progress Notes (Signed)
Office Visit Note   Patient: Sarah Evans           Date of Birth: [birth year]           MRN: [id number] Visit Date: 08/12/2022              Requested by: Storm Frisk, MD [address] [place],  [place] [address] PCP: Storm Frisk, MD   Assessment & Plan: Visit Diagnoses:  1. Chronic pain of both knees   2. Schizoaffective disorder, bipolar type [facility])     Plan: Patient is a 42 year old female with continued bilateral knee pain.  X-rays again did not demonstrate any degenerative changes.  She would like to try cortisone injections.  She also asked for prescription for Olumiant which may my research is a medication for rheumatoid arthritis.  I explained that I do not prescribe that medication nor do I treat rheumatoid arthritis.  If she wanted to find out more about the medication she can make an appointment with a rheumatologist.  Follow-Up Instructions: No follow-ups on file.   Orders:  Orders Placed This Encounter  Procedures   Large Joint Inj: bilateral knee   XR KNEE 3 VIEW LEFT   XR KNEE 3 VIEW RIGHT   Ambulatory referral to Rheumatology   No orders of the defined types were placed in this encounter.     Procedures: Large Joint Inj: bilateral knee on 08/12/2022 3:16 PM Indications: pain Details: 22 G needle  Arthrogram: No  Medications (Right): 2 mL lidocaine 1 %; 2 mL bupivacaine 0.5 %; 40 mg methylPREDNISolone acetate 40 MG/ML Medications (Left): 2 mL lidocaine 1 %; 2 mL bupivacaine 0.5 %; 40 mg methylPREDNISolone acetate 40 MG/ML Outcome: tolerated well, no immediate complications Patient was prepped and draped in the usual sterile fashion.       Clinical Data: No additional findings.   Subjective: Chief Complaint  Patient presents with   Right Knee - Pain   Left Knee - Pain    HPI Patient is a very pleasant 42 year old female comes back to be evaluated for bilateral knee pain.  I saw her about a year ago for the same problem.   She states that physical therapy did not really help.  She tried turmeric but did not take this for a long period of time.  She has not tried any other medications.  She is requesting a prescription for Olumiant based on research that she did online.  Review of Systems   Objective: Vital Signs: There were no vitals taken for this visit.  Physical Exam  Ortho Exam Exam nation of bilateral knees is unchanged. Specialty Comments:  No specialty comments available.  Imaging: XR KNEE 3 VIEW LEFT  Result Date: 08/12/2022 No acute or structural abnormalities  XR KNEE 3 VIEW RIGHT  Result Date: 08/12/2022 No acute or structural abnormalities    PMFS History: Patient Active Problem List   Diagnosis Date Noted   Chronic pain of both knees 07/24/2021   Sleep apnea 06/20/2021   Cholelithiasis 06/20/2021   History of PCOS 05/30/2021   Hirsutism 05/30/2021   Chronic neck pain 11/01/2020   Chronic thoracic spine pain 10/25/2020   GERD without esophagitis 05/27/2020   Numbness and tingling 05/24/2020   History of stroke 05/23/2020   BMI 29.0-29.9,adult 05/21/2020   HSV (herpes simplex virus) anogenital infection 05/21/2020   Prediabetes 04/12/2020   Intrinsic eczema 04/12/2020   Mixed hyperlipidemia 04/12/2020   Muscle spasticity 04/12/2020  Moderate episode of recurrent major depressive disorder (HCC) 06/09/2019   Generalized anxiety disorder 06/09/2019   Personal history of nonsuicidal self-harm 04/28/2018   PTSD (post-traumatic stress disorder) 04/30/2015   Schizoaffective disorder, bipolar type (HCC) 04/27/2015   Past Medical History:  Diagnosis Date   Bipolar 1 disorder (HCC)    Herpes simplex virus (HSV) infection    dx 2001   Polycystic ovary disease    PTSD (post-traumatic stress disorder)    Schizophrenia (HCC)    Stroke (HCC)     Family History  Problem Relation Age of Onset   Bipolar disorder Mother    Diabetes Maternal Grandmother    Hypertension Neg Hx     Heart disease Neg Hx    Cancer Neg Hx     Past Surgical History:  Procedure Laterality Date   BUBBLE STUDY  05/25/2020   Procedure: BUBBLE STUDY;  Surgeon: Chrystie Nose, MD;  Location: [facility] ENDOSCOPY;  Service: Cardiovascular;;   NO PAST SURGERIES     TEE WITHOUT CARDIOVERSION N/A 05/25/2020   Procedure: TRANSESOPHAGEAL ECHOCARDIOGRAM (TEE);  Surgeon: Chrystie Nose, MD;  Location: [facility] ENDOSCOPY;  Service: Cardiovascular;  Laterality: N/A;   Social History   Occupational History   Not on file  Tobacco Use   Smoking status: Former    Current packs/day: 0.00    Average packs/day: 0.3 packs/day for 15.0 years (3.8 ttl pk-yrs)    Types: Cigarettes    Start date: 05/02/2005    Quit date: 05/02/2020    Years since quitting: 2.2   Smokeless tobacco: Never  Vaping Use   Vaping status: Never Used  Substance and Sexual Activity   Alcohol use: No   Drug use: No   Sexual activity: Not Currently    Birth control/protection: Abstinence

## 2022-08-15 ENCOUNTER — Encounter: Payer: Self-pay | Admitting: Critical Care Medicine

## 2022-08-15 ENCOUNTER — Other Ambulatory Visit: Payer: Self-pay

## 2022-08-15 ENCOUNTER — Ambulatory Visit: Payer: Medicare PPO | Attending: Critical Care Medicine | Admitting: Critical Care Medicine

## 2022-08-15 VITALS — BP 120/78 | HR 97 | Temp 98.6°F | Ht 67.0 in | Wt 162.8 lb

## 2022-08-15 DIAGNOSIS — L2084 Intrinsic (allergic) eczema: Secondary | ICD-10-CM | POA: Diagnosis not present

## 2022-08-15 DIAGNOSIS — K219 Gastro-esophageal reflux disease without esophagitis: Secondary | ICD-10-CM | POA: Diagnosis not present

## 2022-08-15 DIAGNOSIS — Z0001 Encounter for general adult medical examination with abnormal findings: Secondary | ICD-10-CM | POA: Diagnosis not present

## 2022-08-15 DIAGNOSIS — F250 Schizoaffective disorder, bipolar type: Secondary | ICD-10-CM

## 2022-08-15 DIAGNOSIS — R7303 Prediabetes: Secondary | ICD-10-CM | POA: Diagnosis not present

## 2022-08-15 DIAGNOSIS — H9193 Unspecified hearing loss, bilateral: Secondary | ICD-10-CM

## 2022-08-15 DIAGNOSIS — E782 Mixed hyperlipidemia: Secondary | ICD-10-CM

## 2022-08-15 DIAGNOSIS — A6004 Herpesviral vulvovaginitis: Secondary | ICD-10-CM

## 2022-08-15 DIAGNOSIS — K8020 Calculus of gallbladder without cholecystitis without obstruction: Secondary | ICD-10-CM | POA: Diagnosis not present

## 2022-08-15 DIAGNOSIS — G8929 Other chronic pain: Secondary | ICD-10-CM

## 2022-08-15 DIAGNOSIS — M546 Pain in thoracic spine: Secondary | ICD-10-CM

## 2022-08-15 DIAGNOSIS — H539 Unspecified visual disturbance: Secondary | ICD-10-CM

## 2022-08-15 DIAGNOSIS — Z0000 Encounter for general adult medical examination without abnormal findings: Secondary | ICD-10-CM

## 2022-08-15 MED ORDER — PANTOPRAZOLE SODIUM 20 MG PO TBEC
20.0000 mg | DELAYED_RELEASE_TABLET | Freq: Every day | ORAL | 0 refills | Status: DC
Start: 2022-08-15 — End: 2022-11-18
  Filled 2022-08-15: qty 30, 30d supply, fill #0

## 2022-08-15 MED ORDER — DICLOFENAC SODIUM 1 % EX GEL
2.0000 g | Freq: Four times a day (QID) | CUTANEOUS | 2 refills | Status: DC
  Filled 2022-08-15: qty 100, 13d supply, fill #0

## 2022-08-15 MED ORDER — VALACYCLOVIR HCL 1 G PO TABS
1000.0000 mg | ORAL_TABLET | Freq: Every day | ORAL | 2 refills | Status: DC
Start: 2022-08-15 — End: 2023-08-26
  Filled 2022-08-15: qty 30, 30d supply, fill #0
  Filled 2022-11-25: qty 30, 30d supply, fill #1
  Filled 2023-03-19: qty 30, 30d supply, fill #2
  Filled 2023-05-04: qty 30, 30d supply, fill #3
  Filled 2023-06-08 – 2023-06-22 (×2): qty 30, 30d supply, fill #4
  Filled 2023-07-16: qty 30, 30d supply, fill #5

## 2022-08-15 MED ORDER — OZEMPIC (2 MG/DOSE) 8 MG/3ML ~~LOC~~ SOPN
2.0000 mg | PEN_INJECTOR | SUBCUTANEOUS | 2 refills | Status: DC
  Filled 2022-08-15 – 2022-09-05 (×2): qty 3, 28d supply, fill #0
  Filled 2022-10-07: qty 3, 28d supply, fill #1
  Filled 2022-10-30: qty 3, 28d supply, fill #2

## 2022-08-15 MED ORDER — ATORVASTATIN CALCIUM 10 MG PO TABS
10.0000 mg | ORAL_TABLET | Freq: Every day | ORAL | 3 refills | Status: DC
  Filled 2022-08-15: qty 90, 90d supply, fill #0
  Filled 2022-11-05: qty 90, 90d supply, fill #1
  Filled 2023-03-31: qty 90, 90d supply, fill #2
  Filled 2023-06-08 – 2023-06-25 (×2): qty 90, 90d supply, fill #3

## 2022-08-15 MED ORDER — TIZANIDINE HCL 2 MG PO CAPS
2.0000 mg | ORAL_CAPSULE | Freq: Three times a day (TID) | ORAL | 1 refills | Status: DC | PRN
Start: 2022-08-15 — End: 2022-08-19
  Filled 2022-08-15: qty 60, 20d supply, fill #0

## 2022-08-15 NOTE — Patient Instructions (Signed)
Labs today Medications refilled Referral to ENT and Eye made REturn 4 months

## 2022-08-15 NOTE — Progress Notes (Deleted)
Complete physical exam  Patient: Sarah Evans   DOB: April 29, 1980   42 y.o. Female  MRN: [id number]  Subjective:    No chief complaint on file.   Sarah Evans is a 42 y.o. female who presents today for a complete physical exam. She reports consuming a {diet types:17450} diet. {types:19826} She generally feels {DESC; WELL/FAIRLY WELL/POORLY:18703}. She reports sleeping {DESC; WELL/FAIRLY WELL/POORLY:18703}. She {does/does not:200015} have additional problems to discuss today.    HPI 10/2020 [name] presents for assistance with filling out mental and physical residual functional capacity assessments.  The patient had a significant stroke earlier this year leaving the patient with left upper extremity weakness and pain.  She also has chronic midthoracic and neck pain as well.  Patient has hyperlipidemia with type 2 diabetes and severe bipolar disorder and severe depression.  She has had psychotic features but not full-blown schizophrenia.  Despite treatment with mental health her depression and bipolar disorder have been significantly impacting her abilities.    The patient has not been able to perform her job and has been out of work for some time.  She sits at home is not able to engage in any social interactions.  Note she is not suicidal.  She was not able to focus on her job and the back pain and left upper extremity weakness and pain have created a situation where she drops objects frequently.  She also has left leg weakness cannot walk.  She has chronic headaches dizziness and feels faint.  The patient also has visual acuity issues in the left eye after the stroke  On arrival blood pressure is good 118/80.  Her diabetes has been reasonably controlled on metformin alone.  She is on statin therapy along with medication for blood pressure.  Minimal residual functional assessment is done as outlined below and per the forms functional residual capacity also is done per forms  below.  6/15   This patient is seen today by way of a video visit.  She is in her bedroom at home.  She is in no distress on the video.  Her chief complaint is that of increased weight gain and considering potential Ozempic.  Unfortunately she only has family-planning Medicaid.  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  She only walks about 3000 steps a day.  She eats a lot of vegetables in her diet.  Her weight has increased and her BMI is 30.  Patient does have prediabetes.  A1c has been elevated in the past needs to be rechecked.  Patient's had previous stroke and is on statin therapy.  Was in the ER in May for abdominal pain found to have cholelithiasis.  She was to follow-up with general surgery for potential resection but this is yet to occur.  Patient also has bipolar disorder and needs refills on her Seroquel.  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  There are no other complaints. See Epworth scale total score is 15 the patient was given Norco in the emergency room in May she still has a bottle of medicine and takes it rarely Patient had a Pap smear in May was normal  07/24/21 This patient is seen in return follow-up and notes that her mental health is improved on her current program and she is [facility] the [facility].  She  now does have the [employer] discount available.  Also has the orange card.  On arrival blood pressure 107/70.  She still having heavy menstrual periods and would like a prescription for birth control medications.  We have sent that to our pharmacy downstairs.  Patient also saw gynecology earlier this year and had a normal Pap smear.  She is also on Aldactone for PCOS.  Patient still has hirsutism despite the Aldactone.  Patient is interested in topical therapy for hirsutism.  Patient complains of bilateral knee p/6  8/9 A1c foot eye  uab AWV  Most recent fall risk assessment:    08/11/2022   12:36 PM  Fall Risk   Falls in the past year? 0  Injury with Fall? 0     Most recent depression screenings:    07/08/2022    3:15 PM 02/19/2022    2:03 PM  PHQ 2/9 Scores  PHQ - 2 Score  0  PHQ- 9 Score  0     Information is confidential and restricted. Go to Review Flowsheets to unlock data.    {VISON DENTAL STD PSA (Optional):27386}  {History (Optional):23778}  Patient Care Team: Storm Frisk, MD as PCP - General (Pulmonary Disease)   Outpatient Medications Prior to Visit  Medication Sig   acetaminophen (TYLENOL) 500 MG tablet Take 1,000 mg by mouth every 6 (six) hours as needed for moderate pain.   APAP-Pamabrom-Pyrilamine (PAMPRIN MAX PAIN FORMULA PO) Take 2 tablets by mouth every 6 (six) hours as needed (menstrual cramps).   diclofenac Sodium (VOLTAREN) 1 % GEL Apply 2 g topically 4 (four) times daily.   ketoconazole (NIZORAL) 2 % shampoo Apply 1 Application topically 2 (two) times a week.   pantoprazole (PROTONIX) 20 MG tablet Take 1 tablet (20 mg total) by mouth once daily.   prazosin (MINIPRESS) 2 MG capsule Take 1 capsule (2 mg total) by mouth once nightly at bedtime.   QUEtiapine (SEROQUEL) 50 MG tablet Take 1 tablet (50 mg total) by mouth once daily.   QUEtiapine Fumarate (SEROQUEL XR) 200 MG 24 hr tablet Take 1 tablet (200 mg total) by mouth at bedtime.   Semaglutide, 2 MG/DOSE, (OZEMPIC, 2 MG/DOSE,) 8 MG/3ML SOPN Inject 2 mg as directed once a week. Must have office visit for refills   valACYclovir (VALTREX) 1000 MG tablet Take 1 tablet (1,000 mg total) by mouth once daily.   zolpidem (AMBIEN) 5 MG tablet Take 1 tablet (5 mg total) by mouth at bedtime as needed for sleep.   No facility-administered medications prior to visit.    ROS        Objective:     There were no vitals taken for this visit. {Vitals History (Optional):23777}  Physical Exam   No results found for any visits on  08/15/22. {Show previous labs (optional):23779}    Assessment & Plan:    Routine Health Maintenance and Physical Exam  Immunization History  Administered Date(s) Administered   COVID-19, mRNA, vaccine(Comirnaty)12 years and older 01/03/2022   Influenza Inj Mdck Quad Pf 01/07/2018   Influenza, Seasonal, Injecte, Preservative Fre 11/27/2015   Influenza,inj,Quad PF,6+ Mos 10/26/2018, 11/01/2020   Influenza,inj,quad, With Preservative 10/14/2016   PFIZER Comirnaty(Gray Top)Covid-19 Tri-Sucrose Vaccine 03/19/2020   PFIZER(Purple Top)SARS-COV-2 Vaccination 06/10/2019, 07/12/2019   PPD Test 01/03/2014   Pfizer Covid-19 Vaccine Bivalent Booster 91yrs & up 09/26/2020   Tdap 11/28/2015, 01/07/2018    Health Maintenance  Topic Date Due   Medicare Annual Wellness (AWV)  Never done  OPHTHALMOLOGY EXAM  Never done   Diabetic kidney evaluation - Urine ACR  05/21/2021   HEMOGLOBIN A1C  01/17/2022   FOOT EXAM  07/25/2022   INFLUENZA VACCINE  08/07/2022   Diabetic kidney evaluation - eGFR measurement  06/16/2023   PAP SMEAR-Modifier  05/31/2026   DTaP/Tdap/Td (3 - Td or Tdap) 01/08/2028   COVID-19 Vaccine  Completed   HIV Screening  Completed   HPV VACCINES  Aged Out   Hepatitis C Screening  Discontinued    Discussed health benefits of physical activity, and encouraged her to engage in regular exercise appropriate for her age and condition.  Problem List Items Addressed This Visit   None  No follow-ups on file.     Shan Levans, MD

## 2022-08-15 NOTE — Progress Notes (Signed)
Annual Wellness Visit     Patient: Sarah Evans, Female    DOB: Sep 08, 1980, 42 y.o.   MRN: [id number]  Subjective  Chief Complaint  Patient presents with   Eye Problem    Pt stated that her left eye has some twitching    Sarah Evans is a 42 y.o. female who presents today for her Annual Wellness Visit. She reports consuming a  vegetarian  diet. The patient does not participate in regular exercise at present. She generally feels fairly well. She reports sleeping poorly. She does have additional problems to discuss today.   10/2020 Sarah Evans presents for assistance with filling out mental and physical residual functional capacity assessments.  The patient had a significant stroke earlier this year leaving the patient with left upper extremity weakness and pain.  She also has chronic midthoracic and neck pain as well.  Patient has hyperlipidemia with type 2 diabetes and severe bipolar disorder and severe depression.  She has had psychotic features but not full-blown schizophrenia.  Despite treatment with mental health her depression and bipolar disorder have been significantly impacting her abilities.    The patient has not been able to perform her job and has been out of work for some time.  She sits at home is not able to engage in any social interactions.  Note she is not suicidal.  She was not able to focus on her job and the back pain and left upper extremity weakness and pain have created a situation where she drops objects frequently.  She also has left leg weakness cannot walk.  She has chronic headaches dizziness and feels faint.  The patient also has visual acuity issues in the left eye after the stroke  On arrival blood pressure is good 118/80.  Her diabetes has been reasonably controlled on metformin alone.  She is on statin therapy along with medication for blood pressure.  Minimal residual functional assessment is done as outlined below and per the forms functional  residual capacity also is done per forms below.  6/15   This patient is seen today by way of a video visit.  She is in her bedroom at home.  She is in no distress on the video.  Her chief complaint is that of increased weight gain and considering potential Ozempic.  Unfortunately she only has family-planning Medicaid.  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  She only walks about 3000 steps a day.  She eats a lot of vegetables in her diet.  Her weight has increased and her BMI is 30.  Patient does have prediabetes.  A1c has been elevated in the past needs to be rechecked.  Patient's had previous stroke and is on statin therapy.  Was in the ER in May for abdominal pain found to have cholelithiasis.  She was to follow-up with general surgery for potential resection but this is yet to occur.  Patient also has bipolar disorder and needs refills on her Seroquel.  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  There are no other complaints. See Epworth scale total score is 15 the patient was given Norco in the emergency room in May she still has a bottle of medicine and takes it rarely Patient had a Pap smear in May was normal  07/24/21 This patient is seen in return follow-up and  notes that her mental health is improved on her current program and she [facility].  She now does have the [place] discount available.  Also has the orange card.  On arrival blood pressure 107/70.  She still having heavy menstrual periods and would like a prescription for birth control medications.  We have sent that to our pharmacy downstairs.  Patient also saw gynecology earlier this year and had a normal Pap smear.  She is also on Aldactone for PCOS.  Patient still has hirsutism despite the Aldactone.  Patient is interested in topical therapy for hirsutism.  Patient complains of  bilateral knee p/6  8/9 This patient presents for a welcome to Medicare exam and complaints of twitching of the left eye recurrent atopic dermatitis bilateral knee pain low back pain alopecia previous stye in left eye now resolved.  She needs a letter to allow a service animal to be in her home.  Patient needs a foot exam and urine for albumin The patient also complains of decreased hearing bilaterally         Medications: Outpatient Medications Prior to Visit  Medication Sig   acetaminophen (TYLENOL) 500 MG tablet Take 1,000 mg by mouth every 6 (six) hours as needed for moderate pain.   APAP-Pamabrom-Pyrilamine (PAMPRIN MAX PAIN FORMULA PO) Take 2 tablets by mouth every 6 (six) hours as needed (menstrual cramps).   ketoconazole (NIZORAL) 2 % shampoo Apply 1 Application topically 2 (two) times a week.   prazosin (MINIPRESS) 2 MG capsule Take 1 capsule (2 mg total) by mouth once nightly at bedtime.   QUEtiapine (SEROQUEL) 50 MG tablet Take 1 tablet (50 mg total) by mouth once daily.   QUEtiapine Fumarate (SEROQUEL XR) 200 MG 24 hr tablet Take 1 tablet (200 mg total) by mouth at bedtime.   zolpidem (AMBIEN) 5 MG tablet Take 1 tablet (5 mg total) by mouth at bedtime as needed for sleep.   [DISCONTINUED] diclofenac Sodium (VOLTAREN) 1 % GEL Apply 2 g topically 4 (four) times daily.   [DISCONTINUED] pantoprazole (PROTONIX) 20 MG tablet Take 1 tablet (20 mg total) by mouth once daily.   [DISCONTINUED] Semaglutide, 2 MG/DOSE, (OZEMPIC, 2 MG/DOSE,) 8 MG/3ML SOPN Inject 2 mg as directed once a week. Must have office visit for refills   [DISCONTINUED] valACYclovir (VALTREX) 1000 MG tablet Take 1 tablet (1,000 mg total) by mouth once daily.   No facility-administered medications prior to visit.    No Known Allergies  Patient Care Team: Storm Frisk, MD as PCP - General (Pulmonary Disease)  Review of Systems  Constitutional:  Negative for chills, diaphoresis, fever, malaise/fatigue and  weight loss.  HENT:  Negative for congestion, hearing loss, nosebleeds, sore throat and tinnitus.   Eyes:  Positive for blurred vision and pain. Negative for photophobia and redness.  Respiratory:  Negative for cough, hemoptysis, sputum production, shortness of breath, wheezing and stridor.   Cardiovascular:  Negative for chest pain, palpitations, orthopnea, claudication, leg swelling and PND.  Gastrointestinal:  Negative for abdominal pain, blood in stool, constipation, diarrhea, heartburn, nausea and vomiting.  Genitourinary:  Negative for dysuria, flank pain, frequency, hematuria and urgency.  Musculoskeletal:  Positive for back pain. Negative for falls, joint pain, myalgias and neck pain.  Skin:  Negative for itching and rash.  Neurological:  Negative for dizziness, tingling, tremors, sensory change, speech change, focal weakness, seizures, loss of consciousness, weakness and headaches.  Endo/Heme/Allergies:  Negative for environmental allergies and  polydipsia. Does not bruise/bleed easily.  Psychiatric/Behavioral:  Negative for depression, memory loss, substance abuse and suicidal ideas. The patient is not nervous/anxious and does not have insomnia.         Objective  BP 120/78   Pulse 97   Temp 98.6 F (37 C)   Ht 5\' 7"  (1.702 m)   Wt 162 lb 12.8 oz (73.8 kg)   SpO2 100%   BMI 25.50 kg/m    Physical Exam Vitals reviewed.  Constitutional:      Appearance: Normal appearance. She is well-developed. She is not diaphoretic.  HENT:     Head: Normocephalic and atraumatic.     Right Ear: Tympanic membrane, ear canal and external ear normal.     Left Ear: External ear normal. There is impacted cerumen.     Nose: No nasal deformity, septal deviation, mucosal edema or rhinorrhea.     Right Sinus: No maxillary sinus tenderness or frontal sinus tenderness.     Left Sinus: No maxillary sinus tenderness or frontal sinus tenderness.     Mouth/Throat:     Pharynx: No oropharyngeal  exudate.  Eyes:     General: No scleral icterus.    Conjunctiva/sclera: Conjunctivae normal.     Pupils: Pupils are equal, round, and reactive to light.  Neck:     Thyroid: No thyromegaly.     Vascular: No carotid bruit or JVD.     Trachea: Trachea normal. No tracheal tenderness or tracheal deviation.  Cardiovascular:     Rate and Rhythm: Normal rate and regular rhythm.     Chest Wall: PMI is not displaced.     Pulses: Normal pulses. No decreased pulses.     Heart sounds: Normal heart sounds, S1 normal and S2 normal. Heart sounds not distant. No murmur heard.    No systolic murmur is present.     No diastolic murmur is present.     No friction rub. No gallop. No S3 or S4 sounds.  Pulmonary:     Effort: No tachypnea, accessory muscle usage or respiratory distress.     Breath sounds: No stridor. No decreased breath sounds, wheezing, rhonchi or rales.  Chest:     Chest wall: No tenderness.  Abdominal:     General: Bowel sounds are normal. There is no distension.     Palpations: Abdomen is soft. Abdomen is not rigid.     Tenderness: There is no abdominal tenderness. There is no guarding or rebound.  Musculoskeletal:        General: Normal range of motion.     Cervical back: Normal range of motion and neck supple. No edema, erythema or rigidity. No muscular tenderness. Normal range of motion.  Lymphadenopathy:     Head:     Right side of head: No submental or submandibular adenopathy.     Left side of head: No submental or submandibular adenopathy.     Cervical: No cervical adenopathy.  Skin:    General: Skin is warm and dry.     Coloration: Skin is not pale.     Findings: No rash.     Nails: There is no clubbing.     Comments: Alopecia appears improved  Neurological:     Mental Status: She is alert and oriented to person, place, and time.     Sensory: No sensory deficit.  Psychiatric:        Speech: Speech normal.        Behavior: Behavior normal.  Most recent  functional status assessment:    08/11/2022   12:36 PM  In your present state of health, do you have any difficulty performing the following activities:  Hearing? 1  Vision? 1  Difficulty concentrating or making decisions? 1  Walking or climbing stairs? 1  Dressing or bathing? 1  Doing errands, shopping? 1  Preparing Food and eating ? Y  Using the Toilet? Y  In the past six months, have you accidently leaked urine? N  Do you have problems with loss of bowel control? N  Managing your Medications? Y  Managing your Finances? Y  Housekeeping or managing your Housekeeping? Y   Most recent fall risk assessment:    08/15/2022    3:26 PM  Fall Risk   Falls in the past year? 0  Number falls in past yr: 0  Injury with Fall? 0  Risk for fall due to : No Fall Risks  Follow up Falls evaluation completed    Most recent depression screenings:    08/15/2022    3:38 PM 08/15/2022    3:27 PM  PHQ 2/9 Scores  PHQ - 2 Score 3 2  PHQ- 9 Score  7   Most recent cognitive screening:     No data to display         Most recent Audit-C alcohol use screening    08/11/2022   12:36 PM  Alcohol Use Disorder Test (AUDIT)  1. How often do you have a drink containing alcohol? 0  3. How often do you have six or more drinks on one occasion? 0   A score of 3 or more in women, and 4 or more in men indicates increased risk for alcohol abuse, EXCEPT if all of the points are from question 1   Vision/Hearing Screen: No results found.    No results found for any visits on 08/15/22.    Assessment & Plan   Annual wellness visit done today including the all of the following: Reviewed patient's Family Medical History Reviewed and updated list of patient's medical providers Assessment of cognitive impairment was done Assessed patient's functional ability Established a written schedule for health screening services Health Risk Assessent Completed and Reviewed  Exercise Activities and Dietary  recommendations  Goals   None     Immunization History  Administered Date(s) Administered   COVID-19, mRNA, vaccine(Comirnaty)12 years and older 01/03/2022   DTP 07/17/1982, 01/14/1985, 04/01/1985, 11/04/1985, 06/05/1987   Hep A / Hep B 02/12/2022, 04/22/2022   Hep B, Unspecified 10/11/1993, 11/13/1993, 04/23/1994   Influenza Inj Mdck Quad Pf 01/07/2018, 01/03/2022   Influenza, Seasonal, Injecte, Preservative Fre 11/27/2015   Influenza,inj,Quad PF,6+ Mos 10/26/2018, 11/01/2020   Influenza,inj,quad, With Preservative 10/14/2016   MMR 01/14/1985   OPV 07/17/1982, 01/14/1985, 04/01/1985, 11/04/1985, 06/05/1987   PFIZER Comirnaty(Gray Top)Covid-19 Tri-Sucrose Vaccine 03/19/2020   PFIZER(Purple Top)SARS-COV-2 Vaccination 06/10/2019, 07/12/2019   PPD Test 01/03/2014   Pfizer Covid-19 Vaccine Bivalent Booster 34yrs & up 09/26/2020   Tdap 11/28/2015, 01/07/2018    Health Maintenance  Topic Date Due   Medicare Annual Wellness (AWV)  Never done   OPHTHALMOLOGY EXAM  Never done   Diabetic kidney evaluation - Urine ACR  05/21/2021   HEMOGLOBIN A1C  01/17/2022   FOOT EXAM  07/25/2022   INFLUENZA VACCINE  10/21/2022 (Originally 08/07/2022)   Diabetic kidney evaluation - eGFR measurement  06/16/2023   PAP SMEAR-Modifier  05/31/2026   DTaP/Tdap/Td (8 - Td or Tdap) 01/08/2028   COVID-19 Vaccine  Completed  HIV Screening  Completed   HPV VACCINES  Aged Out   Hepatitis C Screening  Discontinued     Discussed health benefits of physical activity, and encouraged her to engage in regular exercise appropriate for her age and condition.    Problem List Items Addressed This Visit       Digestive   GERD without esophagitis    Continue acid reduction with proton pump inhibitor      Relevant Medications   pantoprazole (PROTONIX) 20 MG tablet   Cholelithiasis    No evidence of cholecystitis monitor        Nervous and Auditory   Bilateral hearing loss    Referral to ENT       Relevant Orders   Ambulatory referral to ENT     Musculoskeletal and Integument   Intrinsic eczema    Continue topical therapy        Genitourinary   Herpes simplex vulvovaginitis    Continue Valtrex      Relevant Medications   valACYclovir (VALTREX) 1000 MG tablet     Other   Schizoaffective disorder, bipolar type (HCC)    Continue with medication management per psychiatry      Prediabetes - Primary    Controlled continue metformin      Relevant Orders   Comprehensive metabolic panel   Glucose (CBG), Fasting   Hemoglobin A1c   Mixed hyperlipidemia   Relevant Medications   atorvastatin (LIPITOR) 10 MG tablet   Other Relevant Orders   Lipid panel   Chronic thoracic spine pain    Orthopedic follow-up for her back and knees      Relevant Medications   tizanidine (ZANAFLEX) 2 MG capsule   Vision changes    Referral to ophthalmology      Relevant Orders   Ambulatory referral to Ophthalmology   Other Visit Diagnoses     Gastroesophageal reflux disease without esophagitis       Relevant Medications   pantoprazole (PROTONIX) 20 MG tablet       Return in about 4 months (around 12/15/2022) for primary care follow up, chronic conditions.     Shan Levans, MD

## 2022-08-16 DIAGNOSIS — H539 Unspecified visual disturbance: Secondary | ICD-10-CM | POA: Insufficient documentation

## 2022-08-16 DIAGNOSIS — H9193 Unspecified hearing loss, bilateral: Secondary | ICD-10-CM | POA: Insufficient documentation

## 2022-08-16 NOTE — Assessment & Plan Note (Signed)
Continue with medication management per psychiatry

## 2022-08-16 NOTE — Assessment & Plan Note (Signed)
Referral to ENT °

## 2022-08-16 NOTE — Assessment & Plan Note (Signed)
Continue Valtrex 

## 2022-08-16 NOTE — Assessment & Plan Note (Signed)
Orthopedic follow-up for her back and knees

## 2022-08-16 NOTE — Assessment & Plan Note (Signed)
Controlled continue metformin

## 2022-08-16 NOTE — Progress Notes (Signed)
Let pt know prediabetes under good control with ozempic,  cholesterol remains high work on low fat diet as she is doing, kidney liver normal

## 2022-08-16 NOTE — Assessment & Plan Note (Signed)
Continue topical therapy

## 2022-08-16 NOTE — Assessment & Plan Note (Signed)
Referral to ophthalmology 

## 2022-08-16 NOTE — Assessment & Plan Note (Signed)
No evidence of cholecystitis monitor

## 2022-08-16 NOTE — Assessment & Plan Note (Signed)
Continue acid reduction with proton pump inhibitor

## 2022-08-17 ENCOUNTER — Encounter: Payer: Self-pay | Admitting: Critical Care Medicine

## 2022-08-18 ENCOUNTER — Telehealth (HOSPITAL_COMMUNITY): Payer: Self-pay | Admitting: Psychiatry

## 2022-08-18 ENCOUNTER — Other Ambulatory Visit: Payer: Self-pay | Admitting: Critical Care Medicine

## 2022-08-18 ENCOUNTER — Ambulatory Visit: Payer: Medicare PPO | Admitting: Orthopedic Surgery

## 2022-08-18 ENCOUNTER — Other Ambulatory Visit: Payer: Self-pay

## 2022-08-18 ENCOUNTER — Telehealth: Payer: Self-pay

## 2022-08-18 ENCOUNTER — Ambulatory Visit: Payer: Self-pay

## 2022-08-18 NOTE — Telephone Encounter (Addendum)
  Chief Complaint: wanting refill of Metformin and Spironolactone  Symptoms: pt wanting to control hirsutism and alopecia and BS  Not active med- see last note form last OV Pt was to continue Metformin.  Disposition: [] ED /[] Urgent Care (no appt availability in office) / [] Appointment(In office/virtual)/ []  [place] [facility]/ [] Home Care/ [] Refused Recommended Disposition /[] [facility]/ [x]  Follow-up with PCP Additional Notes:  Pt would like to know if ok to take her inositol supplement. Pt asked numerous questions about diet, exercise, blood sugar and cholesterol and PCOS. Answered questions and provided encouragement. Reason for Disposition  Prescription request for new medicine (not a refill)  Answer Assessment - Initial Assessment Questions 1. DRUG NAME: "What medicine do you need to have refilled?"     Metformin, spirinolactone 2. REFILLS REMAINING: "How many refills are remaining?" (Note: The label on the medicine or pill bottle will show how many refills are remaining. If there are no refills remaining, then a renewal may be needed.)     none  Protocols used: Medication Refill and Renewal Call-A-AH

## 2022-08-18 NOTE — Telephone Encounter (Signed)
Copied from CRM [id number]. Topic: General - Other >> Aug 18, 2022 12:36 PM Sarah Evans wrote: Reason for CRM: The patient's pharmacy has called to request a prescription change from capsules to tablets for insurance to cover the cost of the prescription   The patient's prescription for tizanidine (ZANAFLEX) 2 MG capsule [[id number]] will need to be changed for insurance approval   Please contact further when possible

## 2022-08-18 NOTE — Telephone Encounter (Signed)
Patient called for me to let you know she got her labs done w [facility]. Thanks :)

## 2022-08-18 NOTE — Telephone Encounter (Signed)
Noted  

## 2022-08-19 ENCOUNTER — Telehealth: Payer: Self-pay | Admitting: Orthopaedic Surgery

## 2022-08-19 ENCOUNTER — Other Ambulatory Visit: Payer: Self-pay

## 2022-08-19 DIAGNOSIS — G8929 Other chronic pain: Secondary | ICD-10-CM

## 2022-08-19 MED ORDER — METFORMIN HCL 500 MG PO TABS
500.0000 mg | ORAL_TABLET | Freq: Every day | ORAL | 3 refills | Status: DC
  Filled 2022-08-19: qty 30, 30d supply, fill #0
  Filled 2022-09-20: qty 30, 30d supply, fill #1
  Filled 2022-10-07 – 2022-10-17 (×2): qty 30, 30d supply, fill #2
  Filled 2022-11-29: qty 30, 30d supply, fill #3
  Filled 2023-01-02: qty 30, 30d supply, fill #4

## 2022-08-19 MED ORDER — SPIRONOLACTONE 100 MG PO TABS
100.0000 mg | ORAL_TABLET | Freq: Two times a day (BID) | ORAL | 2 refills | Status: DC
Start: 2022-08-19 — End: 2023-03-07
  Filled 2022-08-19: qty 60, 30d supply, fill #0
  Filled 2022-09-20: qty 60, 30d supply, fill #1
  Filled 2022-10-07 – 2022-10-17 (×2): qty 60, 30d supply, fill #2
  Filled 2022-11-05: qty 120, 60d supply, fill #3
  Filled 2022-11-11: qty 60, 30d supply, fill #3
  Filled 2023-01-02: qty 60, 30d supply, fill #4

## 2022-08-19 MED ORDER — TIZANIDINE HCL 4 MG PO TABS: 4.0000 mg | ORAL_TABLET | Freq: Four times a day (QID) | ORAL | 0 refills | Status: DC | PRN

## 2022-08-19 NOTE — Telephone Encounter (Signed)
Rx sent 

## 2022-08-19 NOTE — Telephone Encounter (Signed)
Spoke with patient. Ordered rheumatology referral. Patient made aware that it can take several months for a new patient referral at a specialty location.

## 2022-08-19 NOTE — Telephone Encounter (Signed)
Ok to do.  I would just see them at their next available time.  Let her know this takes months for all new patients

## 2022-08-19 NOTE — Addendum Note (Signed)
Addended by: Storm Frisk on: 08/19/2022 04:51 PM   Modules accepted: Orders

## 2022-08-19 NOTE — Telephone Encounter (Signed)
Patient called asked if she can get referred to [facility]? Patient said she can't be seen until January with the first referral to [facility].  Patient also asked how soon should she be seen by a Rheumatologist? The number to contact patient is [phone]

## 2022-08-20 ENCOUNTER — Telehealth: Payer: Self-pay

## 2022-08-20 ENCOUNTER — Other Ambulatory Visit: Payer: Self-pay

## 2022-08-20 NOTE — Telephone Encounter (Signed)
-----   Message from Shan Levans sent at 08/16/2022  6:11 AM EDT ----- Let pt know prediabetes under good control with ozempic,  cholesterol remains high work on low fat diet as she is doing, kidney liver normal

## 2022-08-20 NOTE — Telephone Encounter (Signed)
There is no affect

## 2022-08-20 NOTE — Telephone Encounter (Signed)
Pt was called and is aware of results, DOB was confirmed.  ?

## 2022-08-21 ENCOUNTER — Other Ambulatory Visit: Payer: Self-pay

## 2022-08-21 NOTE — Telephone Encounter (Signed)
Called patient and she is aware

## 2022-08-26 ENCOUNTER — Telehealth (INDEPENDENT_AMBULATORY_CARE_PROVIDER_SITE_OTHER): Payer: Medicare PPO | Admitting: Psychiatry

## 2022-08-26 ENCOUNTER — Other Ambulatory Visit: Payer: Self-pay

## 2022-08-26 ENCOUNTER — Ambulatory Visit: Payer: Medicare Other | Admitting: Critical Care Medicine

## 2022-08-26 ENCOUNTER — Encounter (HOSPITAL_COMMUNITY): Payer: Self-pay | Admitting: Psychiatry

## 2022-08-26 DIAGNOSIS — F250 Schizoaffective disorder, bipolar type: Secondary | ICD-10-CM

## 2022-08-26 DIAGNOSIS — F4310 Post-traumatic stress disorder, unspecified: Secondary | ICD-10-CM

## 2022-08-26 DIAGNOSIS — F5105 Insomnia due to other mental disorder: Secondary | ICD-10-CM | POA: Diagnosis not present

## 2022-08-26 MED ORDER — QUETIAPINE FUMARATE 50 MG PO TABS
50.0000 mg | ORAL_TABLET | Freq: Every day | ORAL | 3 refills | Status: DC
Start: 2022-08-26 — End: 2022-11-20
  Filled 2022-08-26: qty 30, 30d supply, fill #0

## 2022-08-26 MED ORDER — ZOLPIDEM TARTRATE 5 MG PO TABS
5.0000 mg | ORAL_TABLET | Freq: Every evening | ORAL | 2 refills | Status: DC | PRN
Start: 2022-08-26 — End: 2022-11-20
  Filled 2022-08-26: qty 30, 30d supply, fill #0

## 2022-08-26 MED ORDER — QUETIAPINE FUMARATE ER 200 MG PO TB24
200.0000 mg | ORAL_TABLET | Freq: Every day | ORAL | 3 refills | Status: DC
Start: 2022-08-26 — End: 2022-11-20
  Filled 2022-08-26 – 2022-09-05 (×2): qty 30, 30d supply, fill #0
  Filled 2022-10-20: qty 30, 30d supply, fill #1

## 2022-08-26 MED ORDER — PRAZOSIN HCL 2 MG PO CAPS
2.0000 mg | ORAL_CAPSULE | Freq: Every day | ORAL | 3 refills | Status: DC
Start: 2022-08-26 — End: 2022-11-10
  Filled 2022-08-26: qty 30, 30d supply, fill #0

## 2022-08-26 NOTE — Progress Notes (Signed)
BH MD/PA/NP OP Progress Note Virtual Visit via Video Note  I connected with Sarah Evans on 08/26/22 at  2:00 PM EDT by a video enabled telemedicine application and verified that I am speaking with the correct person using two identifiers.  Location: Patient: Home Provider: Clinic   I discussed the limitations of evaluation and management by telemedicine and the availability of in person appointments. The patient expressed understanding and agreed to proceed.  I provided 30 minutes of non-face-to-face time during this encounter.             08/26/2022 12:43 PM Sarah Evans  MRN:  [id number]  Chief Complaint: "I feel great"    HPI: 42 year old female seen today for follow up psychiatric evaluation. She has psychiatric history of schizoaffective disorder, bipolar 1, depression, anxiety, and PTSD. She is currently managed on Prazosin 2 mg nightly, Lamictal 100 mg daily, Seroquel 200 mg nightly, Ambien 5 mg, and Seroquel 50 mg daily. She reports that her medications are effective in managing her symptoms.   Today she logged in virtually but her camera was turned off.  During exam she was pleasant, cooperative, and engaged in conversation.  She informed Clinical research associate that she feels great.  She informed Clinical research associate that her hallucinations continue to diminish.  She informed Clinical research associate that she may have a VH once a week.  Patient notes that she placed picture of a horse in her room and notes that she believes this is assisting with her hallucinations as it makes her feel calm and less lonely.  Patient also informed writer that recently she had a tooth abscess which was infected.  She notes that she got it treated yesterday and no longer has facial pain.  Provider informed patient that her tooth infection may have exacerbated her hallucination.    Since her last visit she inform her that her anxiety and depression continues to be well-managed.  Today provider conducted GAD-7 and she scored a 4, at her  last visit she scored 2.  Provider also conducted PHQ-9 patient scored a 2, at her last visit she scored a 7.  She endorses adequate sleep and appetite.  Today she denies SI/HI/AH, mania, paranoia.    Provider discussed patient recent lab values.  The patients cholesterol continues to be slightly elevated.  Patient PCP recommended having a low-fat diet.  Patient informed Clinical research associate that she is a vegetarian.  Provider informed patient that milk, cheese, avocado, nuts, and some vegetables contain fat.  She endorsed understanding.  Patient ALT slightly elevated at 74.  Provider recommending her discuss this with her PCP.  She endorsed understanding and agreed.  No medication changes made today.  Patient agreeable to continue medication as prescribed.  Provider informed patient that Ambien will be reduced at her next visit.  Provider informed patient that she will receive a quantity of 15 pills of 30.  She endorsed understanding and agreed.  No other concerns at this time.        Visit Diagnosis:    ICD-10-CM   1. Schizoaffective disorder, bipolar type (HCC)  F25.0 QUEtiapine (SEROQUEL) 50 MG tablet    QUEtiapine (SEROQUEL XR) 200 MG 24 hr tablet    2. PTSD (post-traumatic stress disorder)  F43.10 prazosin (MINIPRESS) 2 MG capsule    3. Insomnia due to other mental disorder  F51.05 zolpidem (AMBIEN) 5 MG tablet   F99        Past Psychiatric History: Schizoaffective disorder, Bipolar 1, Depression, Anxiety, and PTSD  Past  Medical History:  Past Medical History:  Diagnosis Date   Bipolar 1 disorder (HCC)    Herpes simplex virus (HSV) infection    dx 2001   Polycystic ovary disease    PTSD (post-traumatic stress disorder)    Schizophrenia (HCC)    Stroke [facility])     Past Surgical History:  Procedure Laterality Date   BUBBLE STUDY  05/25/2020   Procedure: BUBBLE STUDY;  Surgeon: Chrystie Nose, MD;  Location: [facility] ENDOSCOPY;  Service: Cardiovascular;;   NO PAST SURGERIES     TEE WITHOUT  CARDIOVERSION N/A 05/25/2020   Procedure: TRANSESOPHAGEAL ECHOCARDIOGRAM (TEE);  Surgeon: Chrystie Nose, MD;  Location: [facility] ENDOSCOPY;  Service: Cardiovascular;  Laterality: N/A;    Family Psychiatric History: Bipolar, Schizophrenia, and Depressed. Father Bipolar disorder.   Family History:  Family History  Problem Relation Age of Onset   Bipolar disorder Mother    Diabetes Maternal Grandmother    Hypertension Neg Hx    Heart disease Neg Hx    Cancer Neg Hx     Social History:  Social History   Socioeconomic History   Marital status: Single    Spouse name: Not on file   Number of children: Not on file   Years of education: Not on file   Highest education level: Not on file  Occupational History   Not on file  Tobacco Use   Smoking status: Former    Current packs/day: 0.00    Average packs/day: 0.3 packs/day for 15.0 years (3.8 ttl pk-yrs)    Types: Cigarettes    Start date: 05/02/2005    Quit date: 05/02/2020    Years since quitting: 2.3   Smokeless tobacco: Never  Vaping Use   Vaping status: Never Used  Substance and Sexual Activity   Alcohol use: No   Drug use: No   Sexual activity: Not Currently    Birth control/protection: Abstinence  Other Topics Concern   Not on file  Social History Narrative   Not on file   Social Determinants of Health   Financial Resource Strain: Not on file  Food Insecurity: No Food Insecurity (06/18/2022)   Hunger Vital Sign    Worried About Running Out of Food in the Last Year: Never true    Ran Out of Food in the Last Year: Never true  Transportation Needs: No Transportation Needs (06/18/2022)   PRAPARE - Administrator, Civil Service (Medical): No    Lack of Transportation (Non-Medical): No  Physical Activity: Not on file  Stress: Not on file  Social Connections: Unknown (05/09/2022)   [facility]   Social Network    Social Network: Not on file    Allergies: No Known  Allergies  Metabolic Disorder Labs: Lab Results  Component Value Date   HGBA1C 5.6 08/15/2022   MPG 117 06/15/2020   MPG 128.37 05/24/2020   Lab Results  Component Value Date   PROLACTIN 13.0 04/28/2015   Lab Results  Component Value Date   CHOL 202 (H) 08/15/2022   TRIG 72 08/15/2022   HDL 62 08/15/2022   CHOLHDL 3.3 08/15/2022   VLDL 28 05/24/2020   LDLCALC 127 (H) 08/15/2022   LDLCALC 114 (H) 07/17/2021   Lab Results  Component Value Date   TSH 2.110 04/02/2020   TSH 2.040 10/26/2018    Therapeutic Level Labs: No results found for: "LITHIUM" No results found for: "VALPROATE" No results found for: "CBMZ"  Current Medications: Current Outpatient  Medications  Medication Sig Dispense Refill   acetaminophen (TYLENOL) 500 MG tablet Take 1,000 mg by mouth every 6 (six) hours as needed for moderate pain.     APAP-Pamabrom-Pyrilamine (PAMPRIN MAX PAIN FORMULA PO) Take 2 tablets by mouth every 6 (six) hours as needed (menstrual cramps).     atorvastatin (LIPITOR) 10 MG tablet Take 1 tablet (10 mg total) by mouth daily. 90 tablet 3   diclofenac Sodium (VOLTAREN) 1 % GEL Apply 2 g topically 4 (four) times daily. 50 g 2   ketoconazole (NIZORAL) 2 % shampoo Apply 1 Application topically 2 (two) times a week. 120 mL 0   metFORMIN (GLUCOPHAGE) 500 MG tablet Take 1 tablet (500 mg total) by mouth once daily with breakfast. 60 tablet 3   pantoprazole (PROTONIX) 20 MG tablet Take 1 tablet (20 mg total) by mouth once daily. 30 tablet 0   prazosin (MINIPRESS) 2 MG capsule Take 1 capsule (2 mg total) by mouth once nightly at bedtime. 30 capsule 3   QUEtiapine (SEROQUEL XR) 200 MG 24 hr tablet Take 1 tablet (200 mg total) by mouth at bedtime. 30 tablet 3   QUEtiapine (SEROQUEL) 50 MG tablet Take 1 tablet (50 mg total) by mouth once daily. 30 tablet 3   Semaglutide, 2 MG/DOSE, (OZEMPIC, 2 MG/DOSE,) 8 MG/3ML SOPN Inject 2 mg as directed once a week. Must have office visit for refills 3 mL 2    spironolactone (ALDACTONE) 100 MG tablet Take 1 tablet (100 mg total) by mouth 2 (two) times daily. 390 tablet 2   tiZANidine (ZANAFLEX) 4 MG tablet Take 1 tablet (4 mg total) by mouth every 6 (six) hours as needed for muscle spasms. 30 tablet 0   valACYclovir (VALTREX) 1000 MG tablet Take 1 tablet (1,000 mg total) by mouth once daily. 60 tablet 2   zolpidem (AMBIEN) 5 MG tablet Take 1 tablet (5 mg total) by mouth at bedtime as needed for sleep. 30 tablet 2   No current facility-administered medications for this visit.     Musculoskeletal: Strength & Muscle Tone:  Unable to assess due to patients camera off Gait & Station:  Unable to assess due topatients camera off Patient leans: N/A  Psychiatric Specialty Exam: Review of Systems  There were no vitals taken for this visit.There is no height or weight on file to calculate BMI.  General Appearance:  Unable to assess due to patients camera off  Eye Contact:   Unable to assess due to patients camera off  Speech:  Clear and Coherent and Normal Rate  Volume:  Normal  Mood:  Euthymic  Affect:  Congruent  Thought Process:  Coherent, Goal Directed and Linear  Orientation:  Full (Time, Place, and Person)  Thought Content: WDL and Logical   Suicidal Thoughts:  No  Homicidal Thoughts:  No  Memory:  Immediate;   Good Recent;   Good Remote;   Good  Judgement:  Good  Insight:  Good  Psychomotor Activity:  Normal  Concentration:  Concentration: Good and Attention Span: Good  Recall:  Good  Fund of Knowledge: Good  Language: Good  Akathisia:   Unable to assess duepatients camera off  Handed:  Right  AIMS (if indicated): Not done  Assets:  Communication Skills Desire for Improvement Housing Leisure Time Social Support  ADL's:  Intact  Cognition: WNL  Sleep:  Good   Screenings: AUDIT    Flowsheet Row [facility]  Alcohol  Use Disorder Identification Test  Final Score (AUDIT) 0      GAD-7    Flowsheet Row Video Visit from 08/26/2022 in [facility] Office Visit from 08/15/2022 in [facility] Video Visit from 07[facility]08/2022 in [facility] Office Visit from [facility] Video Visit from 01/31/2022 in [facility]  Total GAD-7 Score 4 3 2 7 2       PHQ2-9    Flowsheet Row Video Visit from 08/26/2022 in [facility] Office Visit from 08/15/2022 in [facility] Video Visit from 07/08/2022 in [facility] Office Visit from 02/19/2022 in [facility] Video Visit from 01/31/2022 in [facility]  PHQ-2 Total Score 0 3 0 0 6  PHQ-9 Total Score 2 7 7  0 10      Flowsheet Row ED from 06/15/2022 in [facility] Department at [facility] ED from 05[facility]22/2021 in [facility] Emergency Department at [facility] ED from 04/11/2021 in [facility] Department at [facility]  C-SSRS RISK CATEGORY No Risk No Risk No Risk        Assessment and Plan: Patient reports she is doing well on her current medication regimen.No medication changes made today.  Patient agreeable to continue medication as prescribed.  Provider informed patient that Ambien will be reduced at her next visit.  Provider informed patient that she will receive a quantity of 15 pills of 30.  She endorsed understanding and agreed.  Provider discussed patient's recent lab results.The patients cholesterol continues to be slightly elevated.  Patient PCP recommended having a low-fat diet.  Patient informed Clinical research associate that she is a vegetarian.  Provider informed patient that milk, cheese, avocado, nuts, and some vegetables contain fat.  She  endorsed understanding.  Patient ALT slightly elevated at 74.  Provider recommending her discuss this with her PCP.  She endorsed understanding and agreed.  1. Schizoaffective disorder, bipolar type (HCC)  Continue- QUEtiapine (SEROQUEL) 50 MG tablet; Take 1 tablet (50 mg total) by mouth once daily.  Dispense: 30 tablet; Refill: 3 Continue- QUEtiapine (SEROQUEL XR) 200 MG 24 hr tablet; Take 1 tablet (200 mg total) by mouth at bedtime.  Dispense: 30 tablet; Refill: 3  2. PTSD (post-traumatic stress disorder)  Continue- prazosin (MINIPRESS) 2 MG capsule; Take 1 capsule (2 mg total) by mouth once nightly at bedtime.  Dispense: 30 capsule; Refill: 3  3. Insomnia due to other mental disorder  Continue- zolpidem (AMBIEN) 5 MG tablet; Take 1 tablet (5 mg total) by mouth at bedtime as needed for sleep.  Dispense: 30 tablet; Refill: 2      Follow up in 3 months Follow-up with therapy   Shanna Cisco, NP 08/26/2022, 12:43 PM

## 2022-09-02 ENCOUNTER — Other Ambulatory Visit: Payer: Self-pay

## 2022-09-03 DIAGNOSIS — R634 Abnormal weight loss: Secondary | ICD-10-CM | POA: Diagnosis not present

## 2022-09-03 DIAGNOSIS — R1084 Generalized abdominal pain: Secondary | ICD-10-CM | POA: Diagnosis not present

## 2022-09-03 DIAGNOSIS — R7989 Other specified abnormal findings of blood chemistry: Secondary | ICD-10-CM | POA: Diagnosis not present

## 2022-09-03 DIAGNOSIS — K625 Hemorrhage of anus and rectum: Secondary | ICD-10-CM | POA: Diagnosis not present

## 2022-09-03 DIAGNOSIS — K642 Third degree hemorrhoids: Secondary | ICD-10-CM | POA: Diagnosis not present

## 2022-09-03 DIAGNOSIS — K5909 Other constipation: Secondary | ICD-10-CM | POA: Diagnosis not present

## 2022-09-04 DIAGNOSIS — K921 Melena: Secondary | ICD-10-CM | POA: Diagnosis not present

## 2022-09-04 DIAGNOSIS — K644 Residual hemorrhoidal skin tags: Secondary | ICD-10-CM | POA: Diagnosis not present

## 2022-09-05 ENCOUNTER — Telehealth: Payer: Self-pay

## 2022-09-05 ENCOUNTER — Other Ambulatory Visit: Payer: Self-pay

## 2022-09-05 DIAGNOSIS — M1991 Primary osteoarthritis, unspecified site: Secondary | ICD-10-CM | POA: Diagnosis not present

## 2022-09-05 DIAGNOSIS — M2540 Effusion, unspecified joint: Secondary | ICD-10-CM | POA: Diagnosis not present

## 2022-09-05 DIAGNOSIS — M25569 Pain in unspecified knee: Secondary | ICD-10-CM | POA: Diagnosis not present

## 2022-09-05 DIAGNOSIS — R634 Abnormal weight loss: Secondary | ICD-10-CM | POA: Diagnosis not present

## 2022-09-05 DIAGNOSIS — Z6823 Body mass index (BMI) 23.0-23.9, adult: Secondary | ICD-10-CM | POA: Diagnosis not present

## 2022-09-05 DIAGNOSIS — R7989 Other specified abnormal findings of blood chemistry: Secondary | ICD-10-CM | POA: Diagnosis not present

## 2022-09-05 NOTE — Telephone Encounter (Signed)
Patient came in requesting a referral to  [facility] Neurology

## 2022-09-09 ENCOUNTER — Telehealth (HOSPITAL_COMMUNITY): Payer: Medicare PPO | Admitting: Psychiatry

## 2022-09-10 DIAGNOSIS — K648 Other hemorrhoids: Secondary | ICD-10-CM | POA: Diagnosis not present

## 2022-09-12 DIAGNOSIS — K8020 Calculus of gallbladder without cholecystitis without obstruction: Secondary | ICD-10-CM | POA: Diagnosis not present

## 2022-09-12 DIAGNOSIS — K5900 Constipation, unspecified: Secondary | ICD-10-CM | POA: Diagnosis not present

## 2022-09-15 NOTE — Telephone Encounter (Signed)
Pt is calling to follow up on referral to neurology please advise - [phone]

## 2022-09-16 ENCOUNTER — Encounter: Payer: Self-pay | Admitting: Critical Care Medicine

## 2022-09-16 DIAGNOSIS — M25569 Pain in unspecified knee: Secondary | ICD-10-CM | POA: Diagnosis not present

## 2022-09-16 DIAGNOSIS — M2540 Effusion, unspecified joint: Secondary | ICD-10-CM | POA: Diagnosis not present

## 2022-09-16 DIAGNOSIS — M1991 Primary osteoarthritis, unspecified site: Secondary | ICD-10-CM | POA: Diagnosis not present

## 2022-09-16 DIAGNOSIS — Z6824 Body mass index (BMI) 24.0-24.9, adult: Secondary | ICD-10-CM | POA: Diagnosis not present

## 2022-09-16 NOTE — Telephone Encounter (Signed)
I have not assessed any problems that would require neurology

## 2022-09-17 ENCOUNTER — Telehealth (INDEPENDENT_AMBULATORY_CARE_PROVIDER_SITE_OTHER): Payer: Self-pay | Admitting: Critical Care Medicine

## 2022-09-17 NOTE — Telephone Encounter (Signed)
Message was sent over mychart and patient viewed comment from Doctor

## 2022-09-17 NOTE — Telephone Encounter (Signed)
Faxed paperwork earlier re faxed paperwork again still waiting on confirmation

## 2022-09-17 NOTE — Telephone Encounter (Signed)
Copied from CRM [id number]. Topic: General - Inquiry >> Sep 17, 2022 12:16 PM Marlow Baars wrote: Reason for CRM: The patient called stating her apartment complex is trying to fax over a form regarding having a pet bird companion. Please assist patient further when the form is received.

## 2022-09-17 NOTE — Telephone Encounter (Signed)
Copied from CRM [id number]. Topic: General - Other >> Sep 17, 2022 11:32 AM Phill Myron wrote: Please  call regarding my Miralax request. PLease advise

## 2022-09-18 NOTE — Telephone Encounter (Signed)
Pt called in asking if fx is being sent to the correct fx # , she has of [phone]. She also wants to know if this is in response to her leasing office or just a test.

## 2022-09-18 NOTE — Telephone Encounter (Signed)
Called patient and she gave me the email address to send since the fax is not working

## 2022-09-18 NOTE — Telephone Encounter (Signed)
Called patient and left voicemail   I have been faxing ( 3 x ) and all came back as the fax line is busy I will continue to fax today

## 2022-09-19 NOTE — Telephone Encounter (Signed)
Pt is calling to report that she followed up with Doctor Delford Field last month. Denies appt request. Follow stroke in 2022 reports that L hand has strain.  Spoke with Dr. Delford Field about clicking sound in Right ear - requesting referral to otolaryngology [phone]  Please advise CB- [phone]

## 2022-09-19 NOTE — Telephone Encounter (Signed)
Call placed to patient unable to reach message left on VM. To discuss with patient otolaryngology referral

## 2022-09-20 ENCOUNTER — Other Ambulatory Visit: Payer: Self-pay | Admitting: Critical Care Medicine

## 2022-09-22 NOTE — Telephone Encounter (Signed)
Requested medication (s) are due for refill today - yes  Requested medication (s) are on the active medication list -yes  Future visit scheduled -yes  Last refill: 08/19/22 #30  Notes to clinic: non delegated Rx  Requested Prescriptions  Pending Prescriptions Disp Refills   tiZANidine (ZANAFLEX) 4 MG tablet [Pharmacy Med Name: TIZANIDINE 4MG  TABLETS] 30 tablet 0    Sig: TAKE 1 TABLET(4 MG) BY MOUTH EVERY 6 HOURS AS NEEDED FOR MUSCLE SPASMS     Not Delegated - Cardiovascular:  Alpha-2 Agonists - tizanidine Failed - 09/20/2022 10:03 PM      Failed - This refill cannot be delegated      Failed - Valid encounter within last 6 months    Recent Outpatient Visits           1 month ago Prediabetes   [facility] Storm Frisk, MD   1 year ago Chronic pain of both knees   [facility] Storm Frisk, MD   1 year ago Prediabetes   [facility] Storm Frisk, MD   1 year ago Obesity (BMI 30-39.9)   [facility], Shea Stakes, NP   1 year ago History of ischemic stroke   [facility] Storm Frisk, MD       Future Appointments             [place], Alben Spittle, MD [facility]   In 1 week London Sheer, MD [facility]   In 2 months Marcine Matar, MD [facility]               Requested Prescriptions  Pending Prescriptions Disp Refills   tiZANidine (ZANAFLEX) 4 MG tablet [Pharmacy Med Name: TIZANIDINE 4MG  TABLETS] 30 tablet 0    Sig: TAKE 1 TABLET(4 MG) BY MOUTH EVERY 6 HOURS AS NEEDED FOR MUSCLE SPASMS     Not Delegated - Cardiovascular:  Alpha-2 Agonists - tizanidine Failed - 09/20/2022 10:03 PM      Failed - This refill cannot be delegated      Failed - Valid encounter within last 6 months     Recent Outpatient Visits           1 month ago Prediabetes   [facility] Storm Frisk, MD   1 year ago Chronic pain of both knees   [facility] Storm Frisk, MD   1 year ago Prediabetes   [facility] Storm Frisk, MD   1 year ago Obesity (BMI 30-39.9)   [facility], Shea Stakes, NP   1 year ago History of ischemic stroke   [facility] Storm Frisk, MD       Future Appointments             [place], Alben Spittle, MD [facility]   In 1 week London Sheer, MD [facility] [place]   In 2 months Marcine Matar, MD [facility]

## 2022-09-22 NOTE — Progress Notes (Signed)
  Cardiology Office Note:  .   Date:  09/22/2022  ID:  Sarah Evans, DOB 06-29-1980, MRN [id number] PCP: Storm Frisk, MD  [facility] Providers Cardiologist:  None    History of Present Illness: .   Sarah Evans is a 42 y.o. female with hx of OSA, GERD, who is here as a self referral who needs a new doctor. She has hx of schizoaffective disorder and GERD. She had an ischemic stroke of the right frontal and occipital region in 2022. TEE did not show PFO.  No afib noted. She comes in wanting to have her heart checked out.   She denies angina, dyspnea on exertion. She has no family hx of CAD. She denies palpitations or syncope.  ROS:  per HPI otherwise negative   Studies Reviewed: Marland Kitchen        EKG Interpretation Date/Time:  Tuesday September 23 2022 14:12:54 EDT Ventricular Rate:  97 PR Interval:  136 QRS Duration:  72 QT Interval:  334 QTC Calculation: 424 R Axis:   49  Text Interpretation: Normal sinus rhythm Normal ECG When compared with ECG of 22-May-2021 13:30, No significant change was found Confirmed by Carolan Clines ([id number]) on 09/23/2022 2:23:04 PM  Risk Assessment/Calculations:        Physical Exam:   VS:   Vitals:   09/23/22 1417  BP: 110/78  Pulse: 97  SpO2: 96%    Wt Readings from Last 3 Encounters:  08/15/22 162 lb 12.8 oz (73.8 kg)  06/16/22 165 lb (74.8 kg)  05/30/22 160 lb (72.6 kg)    GEN: Well nourished, well developed in no acute distress NECK: No JVD;  CARDIAC: RRR, no murmurs, rubs, gallops RESPIRATORY:  Clear to auscultation without rales, wheezing or rhonchi  ABDOMEN: Soft, non-tender, non-distended EXTREMITIES:  No edema; No deformity   ASSESSMENT AND PLAN: .   Normal cardiac w/u  CVA - no PFO - no cardiac monitor    Dispo:  No further cardiac w/u needed  Signed, Maisie Fus, MD

## 2022-09-23 ENCOUNTER — Encounter: Payer: Self-pay | Admitting: Internal Medicine

## 2022-09-23 ENCOUNTER — Ambulatory Visit: Payer: Medicare PPO | Attending: Internal Medicine | Admitting: Internal Medicine

## 2022-09-23 VITALS — BP 110/78 | HR 97 | Ht 67.0 in | Wt 156.6 lb

## 2022-09-23 DIAGNOSIS — I639 Cerebral infarction, unspecified: Secondary | ICD-10-CM | POA: Diagnosis not present

## 2022-09-23 DIAGNOSIS — Z136 Encounter for screening for cardiovascular disorders: Secondary | ICD-10-CM | POA: Diagnosis not present

## 2022-09-23 NOTE — Patient Instructions (Signed)
Medication Instructions:  No changes *If you need a refill on your cardiac medications before your next appointment, please call your pharmacy*  Follow-Up: At [facility], you and your health needs are our priority.  As part of our continuing mission to provide you with exceptional heart care, we have created designated Provider Care Teams.  These Care Teams include your primary Cardiologist (physician) and Advanced Practice Providers (APPs -  Physician Assistants and Nurse Practitioners) who all work together to provide you with the care you need, when you need it.  We recommend signing up for the patient portal called "MyChart".  Sign up information is provided on this After Visit Summary.  MyChart is used to connect with patients for Virtual Visits (Telemedicine).  Patients are able to view lab/test results, encounter notes, upcoming appointments, etc.  Non-urgent messages can be sent to your provider as well.   To learn more about what you can do with MyChart, go to [email or web address].    Your next appointment:    Follow up as needed  Provider:   Dr Wyline Mood

## 2022-09-29 ENCOUNTER — Ambulatory Visit (INDEPENDENT_AMBULATORY_CARE_PROVIDER_SITE_OTHER): Payer: Medicare PPO | Admitting: Orthopedic Surgery

## 2022-09-29 DIAGNOSIS — M546 Pain in thoracic spine: Secondary | ICD-10-CM | POA: Diagnosis not present

## 2022-09-29 DIAGNOSIS — G8929 Other chronic pain: Secondary | ICD-10-CM | POA: Diagnosis not present

## 2022-09-29 DIAGNOSIS — M545 Low back pain, unspecified: Secondary | ICD-10-CM | POA: Diagnosis not present

## 2022-09-29 NOTE — Progress Notes (Signed)
Orthopedic Spine Surgery Office Note 2   Assessment: Patient is a 42 y.o. female with chronic thoracic and lumbar back pain.  No radicular symptoms     Plan: -Patient has tried Tylenol, NSAIDs, PT -Patient is still having significant thoracic and lumbar back pain after PT and regular tylenol for over six weeks, so ordered MRI of the thoracic and lumbar spine to evaluate further -Will call her with results of the MRI. She was interested in an injection so we will see if there is an area to target on the MRI     Patient expressed understanding of the plan and all questions were answered to the patient's satisfaction.    ___________________________________________________________________________     History:   Patient is a 42 y.o. female who presents today for follow up on her lumbar spine.  Patient states that she is no longer having neck pain.  She still has significant back pain.  It is the same as it was at the last visit.  She points from the mid thoracic region to the lower lumbar spine as the areas where it hurts.  She has not developed any radicular symptoms.  She has no bowel or bladder incontinence.  No saddle anesthesia.   Treatments tried: Tylenol, NSAIDs, PT     Physical Exam:   General: no acute distress, appears stated age Neurologic: alert, answering questions appropriately, following commands Respiratory: unlabored breathing on room air, symmetric chest rise Psychiatric: appropriate affect, normal cadence to speech     MSK (spine):   -Strength exam                                                   Left                  Right EHL                              5/5                  5/5 TA                                 5/5                  5/5 GSC                             5/5                  5/5 Knee extension            5/5                  5/5 Hip flexion                    5/5                  5/5   -Sensory exam                           Sensation intact  to light touch in L3-S1 nerve distributions of bilateral lower extremities  Imaging: XRs of the lumbar spine from 05/30/2022 were previously independently reviewed and interpreted, showing no significant degenerative changes.  No fracture or dislocation seen.  No evidence of instability on flexion/extension views.     Patient name: Sarah Evans Patient MRN: [id number] Date of visit: 09/29/22

## 2022-09-30 ENCOUNTER — Other Ambulatory Visit: Payer: Self-pay | Admitting: Orthopedic Surgery

## 2022-09-30 DIAGNOSIS — S2095XA Superficial foreign body of unspecified parts of thorax, initial encounter: Secondary | ICD-10-CM

## 2022-10-02 DIAGNOSIS — K648 Other hemorrhoids: Secondary | ICD-10-CM | POA: Diagnosis not present

## 2022-10-07 ENCOUNTER — Other Ambulatory Visit: Payer: Self-pay

## 2022-10-07 ENCOUNTER — Other Ambulatory Visit (HOSPITAL_BASED_OUTPATIENT_CLINIC_OR_DEPARTMENT_OTHER): Payer: Self-pay

## 2022-10-08 ENCOUNTER — Other Ambulatory Visit: Payer: Self-pay

## 2022-10-16 DIAGNOSIS — H9311 Tinnitus, right ear: Secondary | ICD-10-CM | POA: Diagnosis not present

## 2022-10-17 ENCOUNTER — Other Ambulatory Visit: Payer: Self-pay

## 2022-10-20 ENCOUNTER — Other Ambulatory Visit: Payer: Self-pay

## 2022-10-24 ENCOUNTER — Encounter: Payer: Self-pay | Admitting: Orthopedic Surgery

## 2022-10-28 ENCOUNTER — Ambulatory Visit
Admission: RE | Admit: 2022-10-28 | Discharge: 2022-10-28 | Disposition: A | Discharge status: Home / Self Care | Payer: Medicare PPO | Source: Ambulatory Visit | Attending: Orthopedic Surgery | Admitting: Orthopedic Surgery

## 2022-10-28 DIAGNOSIS — G8929 Other chronic pain: Secondary | ICD-10-CM

## 2022-10-28 DIAGNOSIS — M546 Pain in thoracic spine: Secondary | ICD-10-CM | POA: Diagnosis not present

## 2022-10-28 DIAGNOSIS — M47816 Spondylosis without myelopathy or radiculopathy, lumbar region: Secondary | ICD-10-CM | POA: Diagnosis not present

## 2022-10-29 DIAGNOSIS — K648 Other hemorrhoids: Secondary | ICD-10-CM | POA: Diagnosis not present

## 2022-10-31 ENCOUNTER — Other Ambulatory Visit: Payer: Self-pay

## 2022-11-03 ENCOUNTER — Other Ambulatory Visit (HOSPITAL_BASED_OUTPATIENT_CLINIC_OR_DEPARTMENT_OTHER): Payer: Self-pay

## 2022-11-05 ENCOUNTER — Other Ambulatory Visit: Payer: Self-pay

## 2022-11-05 ENCOUNTER — Other Ambulatory Visit: Payer: Self-pay | Admitting: Critical Care Medicine

## 2022-11-06 ENCOUNTER — Telehealth: Payer: Self-pay | Admitting: Critical Care Medicine

## 2022-11-06 NOTE — Telephone Encounter (Signed)
Copied from CRM [id number]. Topic: Appointment Scheduling - Scheduling Inquiry for Clinic >> Nov 04, 2022  4:31 PM Franchot Heidelberg wrote: Reason for CRM: Pt called requesting to be seen sooner because she wants lab work to check her hormones. Please advise   Best contact: [phone]

## 2022-11-07 ENCOUNTER — Other Ambulatory Visit: Payer: Self-pay

## 2022-11-08 ENCOUNTER — Other Ambulatory Visit (HOSPITAL_COMMUNITY): Payer: Self-pay | Admitting: Psychiatry

## 2022-11-08 DIAGNOSIS — F4310 Post-traumatic stress disorder, unspecified: Secondary | ICD-10-CM

## 2022-11-10 ENCOUNTER — Telehealth: Payer: Self-pay | Admitting: Orthopedic Surgery

## 2022-11-10 ENCOUNTER — Ambulatory Visit: Payer: Medicare PPO | Admitting: Orthopedic Surgery

## 2022-11-10 ENCOUNTER — Other Ambulatory Visit: Payer: Self-pay

## 2022-11-10 NOTE — Telephone Encounter (Signed)
Patient called and wanted to know can you do a virtual call for the MRI results or over the phone?CB#[phone]

## 2022-11-11 ENCOUNTER — Other Ambulatory Visit: Payer: Self-pay | Admitting: Obstetrics and Gynecology

## 2022-11-11 ENCOUNTER — Telehealth: Payer: Self-pay | Admitting: Orthopedic Surgery

## 2022-11-11 ENCOUNTER — Other Ambulatory Visit: Payer: Self-pay

## 2022-11-11 DIAGNOSIS — Z1231 Encounter for screening mammogram for malignant neoplasm of breast: Secondary | ICD-10-CM

## 2022-11-11 NOTE — Telephone Encounter (Signed)
Pt called asking for results of MRI over the phone. Pt states she missed a few appts due to other appts and got confused. Pease call pt at [phone].

## 2022-11-11 NOTE — Telephone Encounter (Signed)
Patient has appointment on 12/03/2022. No further assistance required at this time.

## 2022-11-12 ENCOUNTER — Ambulatory Visit (INDEPENDENT_AMBULATORY_CARE_PROVIDER_SITE_OTHER): Payer: Medicare PPO | Admitting: Orthopedic Surgery

## 2022-11-12 DIAGNOSIS — M47816 Spondylosis without myelopathy or radiculopathy, lumbar region: Secondary | ICD-10-CM | POA: Diagnosis not present

## 2022-11-12 NOTE — Progress Notes (Signed)
Orthopedic Spine Surgery Office Note 2   Assessment: Patient is a 42 y.o. female with chronic thoracic and lumbar back pain.  No radicular symptoms     Plan: -Patient has tried Tylenol, NSAIDs, PT -Patient has facet arthropathy at L4/5 so recommended diagnostic/therapeutic injection. Referral provided to Dr. Alvester Morin -If that injection does not work, we could try a muscle relaxer or I could refer her to pain management -Patient can return to the office on an as needed basis     Patient expressed understanding of the plan and all questions were answered to the patient's satisfaction.    ___________________________________________________________________________     History:   Patient is a 42 y.o. female who presents today for follow up on her lumbar spine.  She is still having pain that she localizes to the upper lumbar spine. She feels it today most only the right side. It is worse with activity and improves with rest. She is not having any radiating leg pain. No bowel or bladder incontinence. No saddle anesthesia. No unsteadiness with gait. She has not developed any new symptoms since she was last seen. Patient rates the pain as an 8/10 at its worst. She has had this pain for over 20 years now.   Treatments tried: Tylenol, NSAIDs, PT     Physical Exam:   General: no acute distress, appears stated age Neurologic: alert, answering questions appropriately, following commands Respiratory: unlabored breathing on room air, symmetric chest rise Psychiatric: appropriate affect, normal cadence to speech     MSK (spine):   -Strength exam                                                   Left                  Right EHL                              5/5                  5/5 TA                                 5/5                  5/5 GSC                             5/5                  5/5 Knee extension            5/5                  5/5 Hip flexion                    5/5                   5/5   -Sensory exam                           Sensation intact to light touch in L3-S1 nerve distributions of bilateral  lower extremities     Imaging: XRs of the lumbar spine from 05/30/2022 were previously independently reviewed and interpreted, showing no significant degenerative changes.  No fracture or dislocation seen.  No evidence of instability on flexion/extension views.  MRI of the thoracic and lumbar spine from 10/28/2022 was independently reviewed and interpreted, showing facet arthropathy and early disc desiccation at L4/5. No other significant degenerative changes. No significant stenosis seen.      Patient name: Sarah Evans Patient MRN: [id number] Date of visit: 11/12/22

## 2022-11-12 NOTE — Telephone Encounter (Signed)
Patient came in today for MRI Review of TSP and LSP

## 2022-11-13 ENCOUNTER — Encounter: Payer: Medicaid Other | Admitting: Obstetrics and Gynecology

## 2022-11-18 ENCOUNTER — Encounter: Payer: Self-pay | Admitting: Obstetrics and Gynecology

## 2022-11-18 ENCOUNTER — Other Ambulatory Visit (HOSPITAL_COMMUNITY)
Admission: RE | Admit: 2022-11-18 | Discharge: 2022-11-18 | Disposition: A | Discharge status: Home / Self Care | Payer: Medicare PPO | Source: Ambulatory Visit | Attending: Obstetrics and Gynecology | Admitting: Obstetrics and Gynecology

## 2022-11-18 ENCOUNTER — Ambulatory Visit (INDEPENDENT_AMBULATORY_CARE_PROVIDER_SITE_OTHER): Payer: Medicare PPO | Admitting: Obstetrics and Gynecology

## 2022-11-18 VITALS — BP 90/72 | HR 101 | Ht 67.0 in | Wt 148.0 lb

## 2022-11-18 DIAGNOSIS — E282 Polycystic ovarian syndrome: Secondary | ICD-10-CM | POA: Diagnosis not present

## 2022-11-18 DIAGNOSIS — Z789 Other specified health status: Secondary | ICD-10-CM | POA: Diagnosis not present

## 2022-11-18 DIAGNOSIS — Z1151 Encounter for screening for human papillomavirus (HPV): Secondary | ICD-10-CM | POA: Diagnosis not present

## 2022-11-18 DIAGNOSIS — B009 Herpesviral infection, unspecified: Secondary | ICD-10-CM

## 2022-11-18 DIAGNOSIS — Z9189 Other specified personal risk factors, not elsewhere classified: Secondary | ICD-10-CM | POA: Diagnosis not present

## 2022-11-18 DIAGNOSIS — Z01419 Encounter for gynecological examination (general) (routine) without abnormal findings: Secondary | ICD-10-CM | POA: Insufficient documentation

## 2022-11-18 DIAGNOSIS — Z8742 Personal history of other diseases of the female genital tract: Secondary | ICD-10-CM

## 2022-11-18 DIAGNOSIS — Z124 Encounter for screening for malignant neoplasm of cervix: Secondary | ICD-10-CM

## 2022-11-18 NOTE — Patient Instructions (Signed)

## 2022-11-18 NOTE — Progress Notes (Signed)
42 y.o. 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   Pt c/o a small bump under her arm.  She would like to discuss treatment for PCOS.   Patient's last menstrual period was 10/27/2022 (approximate). Period Duration (Days): 2 Period Pattern: Regular Menstrual Flow: Light Menstrual Control: Thin pad Dysmenorrhea: (!) Mild Dysmenorrhea Symptoms: Cramping  Abnormal bleeding: no Pelvic discharge or pain: none Breast mass, nipple discharge or skin changes : none Birth control: abstinence Last PAP:     Component Value Date/Time   DIAGPAP  05/30/2021 1616    - Negative for intraepithelial lesion or malignancy (NILM)   HPVHIGH Negative 05/30/2021 1616   ADEQPAP  05/30/2021 1616    Satisfactory for evaluation; transformation zone component PRESENT.   Last mammogram: scheduled Last colonoscopy: 09/04/22 Sexually active: no, celibate x3 years  Exercising: Not currently exercising. Has lost about 50lb since using ozempic.  GYN HISTORY: HSV PCOS  OB History  Gravida Para Term Preterm AB Living  1 1 0 1 0 0  SAB IAB Ectopic Multiple Live Births  0 0 0 0 1    # Outcome Date GA Lbr Len/2nd Weight Sex Type Anes PTL Lv  1 Preterm 11/27/15 [redacted]w[redacted]d       ND    Past Medical History:  Diagnosis Date   Bipolar 1 disorder (HCC)    Gallstone    Herpes simplex virus (HSV) infection    dx 2001   Polycystic ovary disease    PTSD (post-traumatic stress disorder)    Schizophrenia (HCC)    Stroke [facility])     Past Surgical History:  Procedure Laterality Date   BUBBLE STUDY  05/25/2020   Procedure: BUBBLE STUDY;  Surgeon: Chrystie Nose, MD;  Location: [facility] ENDOSCOPY;  Service: Cardiovascular;;   NO PAST SURGERIES     TEE WITHOUT CARDIOVERSION N/A 05/25/2020   Procedure: TRANSESOPHAGEAL ECHOCARDIOGRAM (TEE);  Surgeon: Chrystie Nose, MD;  Location: [facility] ENDOSCOPY;  Service: Cardiovascular;  Laterality: N/A;    Current Outpatient Medications on File  Prior to Visit  Medication Sig Dispense Refill   acetaminophen (TYLENOL) 500 MG tablet Take 1,000 mg by mouth every 6 (six) hours as needed for moderate pain.     APAP-Pamabrom-Pyrilamine (PAMPRIN MAX PAIN FORMULA PO) Take 2 tablets by mouth every 6 (six) hours as needed (menstrual cramps).     atorvastatin (LIPITOR) 10 MG tablet Take 1 tablet (10 mg total) by mouth daily. 90 tablet 3   metFORMIN (GLUCOPHAGE) 500 MG tablet Take 1 tablet (500 mg total) by mouth once daily with breakfast. 60 tablet 3   minoxidil (LONITEN) 2.5 MG tablet Take 2.5 mg by mouth 2 (two) times daily.     prazosin (MINIPRESS) 2 MG capsule TAKE 1 CAPSULE(2 MG) BY MOUTH EVERY NIGHT AT BEDTIME 30 capsule 3   QUEtiapine (SEROQUEL XR) 200 MG 24 hr tablet Take 1 tablet (200 mg total) by mouth at bedtime. 30 tablet 3   QUEtiapine (SEROQUEL) 50 MG tablet Take 1 tablet (50 mg total) by mouth once daily. 30 tablet 3   Semaglutide, 2 MG/DOSE, (OZEMPIC, 2 MG/DOSE,) 8 MG/3ML SOPN Inject 2 mg as directed once a week. Must have office visit for refills 3 mL 2   spironolactone (ALDACTONE) 100 MG tablet Take 1 tablet (100 mg total) by mouth 2 (two) times daily. 390 tablet 2   tiZANidine (ZANAFLEX) 4 MG tablet TAKE 1 TABLET(4 MG) BY MOUTH EVERY 6 HOURS AS NEEDED FOR MUSCLE SPASMS  30 tablet 1   valACYclovir (VALTREX) 1000 MG tablet Take 1 tablet (1,000 mg total) by mouth once daily. 60 tablet 2   zolpidem (AMBIEN) 5 MG tablet Take 1 tablet (5 mg total) by mouth at bedtime as needed for sleep. 30 tablet 2   celecoxib (CELEBREX) 100 MG capsule SMARTSIG:1 Capsule(s) By Mouth 1-2 Times Daily PRN     No current facility-administered medications on file prior to visit.    Social History   Socioeconomic History   Marital status: Single    Spouse name: Not on file   Number of children: Not on file   Years of education: Not on file   Highest education level: Not on file  Occupational History   Not on file  Tobacco Use   Smoking status:  Former    Current packs/day: 0.00    Average packs/day: 0.3 packs/day for 15.0 years (3.8 ttl pk-yrs)    Types: Cigarettes    Start date: 05/02/2005    Quit date: 05/02/2020    Years since quitting: 2.5   Smokeless tobacco: Never  Vaping Use   Vaping status: Never Used  Substance and Sexual Activity   Alcohol use: No   Drug use: No   Sexual activity: Not Currently    Birth control/protection: Abstinence  Other Topics Concern   Not on file  Social History Narrative   Not on file   Social Determinants of Health   Financial Resource Strain: Low Risk  (09/09/2022)   Received from [facility]   Overall Financial Resource Strain (CARDIA)    Difficulty of Paying Living Expenses: Not hard at all  Food Insecurity: No Food Insecurity (09/09/2022)   [facility]   Hunger Vital Sign    Worried About Running Out of Food in the Last Year: Never true    Ran Out of Food in the Last Year: Never true  Transportation Needs: No Transportation Needs (09/09/2022)   Received from [facility] - Transportation    Lack of Transportation (Medical): No    Lack of Transportation (Non-Medical): No  Physical Activity: Unknown (09/09/2022)   Received from [facility]   Exercise Vital Sign    Days of Exercise per Week: 0 days    Minutes of Exercise per Session: Not on file  Stress: No Stress Concern Present (09/09/2022)   Received from [facility] - Occupational Stress Questionnaire    Feeling of Stress : Only a little  Social Connections: Socially Integrated (09/09/2022)   Received from [facility]   Social Network    How would you rate your social network (family, work, friends)?: Good participation with social networks  Intimate Partner Violence: Not At Risk (09/09/2022)   Received from [facility]   HITS    Over the last 12 months how often did your partner physically hurt you?: Never    Over the last 12 months how often did your  partner insult you or talk down to you?: Never    Over the last 12 months how often did your partner threaten you with physical harm?: Never    Over the last 12 months how often did your partner scream or curse at you?: Never    Family History  Problem Relation Age of Onset   Bipolar disorder Mother    Diabetes Maternal Grandmother    Hypertension Neg Hx    Heart disease Neg Hx    Cancer Neg Hx  No Known Allergies    PE Today's Vitals   11/18/22 1408  BP: 90/72  Pulse: (!) 101  SpO2: 98%  Weight: 148 lb (67.1 kg)  Height: 5\' 7"  (1.702 m)   Body mass index is 23.18 kg/m.  Physical Exam Vitals reviewed. Exam conducted with a chaperone present.  Constitutional:      General: She is not in acute distress.    Appearance: Normal appearance.  HENT:     Head: Normocephalic and atraumatic.     Nose: Nose normal.  Eyes:     Extraocular Movements: Extraocular movements intact.     Conjunctiva/sclera: Conjunctivae normal.  Neck:     Thyroid: No thyroid mass, thyromegaly or thyroid tenderness.  Pulmonary:     Effort: Pulmonary effort is normal.  Chest:     Chest wall: No mass or tenderness.  Breasts:    Right: Normal. No swelling, mass, nipple discharge, skin change or tenderness.     Left: Normal. No swelling, mass, nipple discharge, skin change or tenderness.  Abdominal:     General: There is no distension.     Palpations: Abdomen is soft.     Tenderness: There is no abdominal tenderness.  Genitourinary:    General: Normal vulva.     Exam position: Lithotomy position.     Urethra: No prolapse.     Vagina: Normal. No vaginal discharge or bleeding.     Cervix: Normal. No lesion.     Uterus: Normal. Not enlarged and not tender.      Adnexa: Right adnexa normal and left adnexa normal.  Musculoskeletal:        General: Normal range of motion.     Cervical back: Normal range of motion.  Lymphadenopathy:     Upper Body:     Right upper body: No axillary adenopathy.      Left upper body: No axillary adenopathy.     Lower Body: No right inguinal adenopathy. No left inguinal adenopathy.  Skin:    General: Skin is warm and dry.     Comments: Hair bump noted under right axilla  Neurological:     General: No focal deficit present.     Mental Status: She is alert.  Psychiatric:        Mood and Affect: Mood normal.        Behavior: Behavior normal.       Assessment and Plan:        Well woman exam with routine gynecological exam Assessment & Plan: Cervical cancer screening performed according to ASCCP guidelines. Encouraged annual mammogram screening Colonoscopy 2024 DXA N/A Labs and immunizations with her primary Encouraged safe sexual practices as indicated Encouraged healthy lifestyle practices with diet and exercise For patients under 50yo, I recommend 1000mg  calcium daily and 600IU of vitamin D daily.    Cervical cancer screening -     Cytology - PAP  History of PCOS Assessment & Plan: 2024 TVUS with PCOS appearing ovaries Pt requesting hormonal labs, will reassess testosterone levels Reports that she previously managed cycles with hormonal therapy, however she discontinue 1 year ago and her cycles have remained regular. Reviewed that weight management can improve ovulation and menstrual regularity PCP checked labs this year: normal A1c, lipid panel improving Encouraged weight bearing exercise for maintenance of muscle mass All questions asnwered  Orders: -     Testos,Total,Free and SHBG (Female) -     Thyroid Panel With TSH    Rosalyn Gess, MD

## 2022-11-18 NOTE — Assessment & Plan Note (Signed)
2024 TVUS with PCOS appearing ovaries Pt requesting hormonal labs, will reassess testosterone levels Reports that she previously managed cycles with hormonal therapy, however she discontinue 1 year ago and her cycles have remained regular. Reviewed that weight management can improve ovulation and menstrual regularity PCP checked labs this year: normal A1c, lipid panel improving Encouraged weight bearing exercise for maintenance of muscle mass All questions asnwered

## 2022-11-18 NOTE — Assessment & Plan Note (Signed)
Cervical cancer screening performed according to ASCCP guidelines. Encouraged annual mammogram screening Colonoscopy 2024 DXA N/A Labs and immunizations with her primary Encouraged safe sexual practices as indicated Encouraged healthy lifestyle practices with diet and exercise For patients under 40yo, I recommend 1000mg  calcium daily and 600IU of vitamin D daily.

## 2022-11-20 ENCOUNTER — Encounter (HOSPITAL_COMMUNITY): Payer: Self-pay | Admitting: Psychiatry

## 2022-11-20 ENCOUNTER — Telehealth (HOSPITAL_COMMUNITY): Payer: Medicare PPO | Admitting: Psychiatry

## 2022-11-20 ENCOUNTER — Other Ambulatory Visit: Payer: Self-pay

## 2022-11-20 DIAGNOSIS — F99 Mental disorder, not otherwise specified: Secondary | ICD-10-CM

## 2022-11-20 DIAGNOSIS — F4310 Post-traumatic stress disorder, unspecified: Secondary | ICD-10-CM | POA: Diagnosis not present

## 2022-11-20 DIAGNOSIS — F250 Schizoaffective disorder, bipolar type: Secondary | ICD-10-CM

## 2022-11-20 DIAGNOSIS — F5105 Insomnia due to other mental disorder: Secondary | ICD-10-CM | POA: Diagnosis not present

## 2022-11-20 MED ORDER — ZOLPIDEM TARTRATE 5 MG PO TABS
5.0000 mg | ORAL_TABLET | Freq: Every evening | ORAL | 2 refills | Status: DC | PRN
  Filled 2022-11-20: qty 30, 30d supply, fill #0

## 2022-11-20 MED ORDER — ZOLPIDEM TARTRATE 5 MG PO TABS
5.0000 mg | ORAL_TABLET | Freq: Every evening | ORAL | 2 refills | Status: DC | PRN
  Filled 2022-11-20: qty 15, 15d supply, fill #0

## 2022-11-20 MED ORDER — QUETIAPINE FUMARATE ER 200 MG PO TB24
200.0000 mg | ORAL_TABLET | Freq: Every day | ORAL | 3 refills | Status: DC
  Filled 2022-11-20: qty 30, 30d supply, fill #0

## 2022-11-20 MED ORDER — QUETIAPINE FUMARATE 50 MG PO TABS
50.0000 mg | ORAL_TABLET | Freq: Two times a day (BID) | ORAL | 3 refills | Status: DC
  Filled 2022-11-20 – 2022-11-29 (×2): qty 60, 30d supply, fill #0

## 2022-11-20 MED ORDER — PRAZOSIN HCL 2 MG PO CAPS
2.0000 mg | ORAL_CAPSULE | Freq: Every day | ORAL | 3 refills | Status: DC
  Filled 2022-11-20: qty 30, 30d supply, fill #0

## 2022-11-20 NOTE — Progress Notes (Signed)
BH MD/PA/NP OP Progress Note Virtual Visit via Video Note  I connected with [name] on 11/20/22 at  3:00 PM EST by a video enabled telemedicine application and verified that I am speaking with the correct person using two identifiers.  Location: Patient: Home Provider: Clinic   I discussed the limitations of evaluation and management by telemedicine and the availability of in person appointments. The patient expressed understanding and agreed to proceed.  I provided 30 minutes of non-face-to-face time during this encounter.             11/20/2022 12:23 PM WILMER FLANNIGAN  MRN:  [id number]  Chief Complaint: "My hallucinations are worse at night"    HPI: 42 year old female seen today for follow up psychiatric evaluation. She has psychiatric history of schizoaffective disorder, bipolar 1, depression, anxiety, and PTSD. She is currently managed on Prazosin 2 mg nightly, Seroquel 200 mg nightly, Ambien 5 mg, and Seroquel 50 mg daily. She reports that her medications are somewhat effective in managing her symptoms.   Today she logged in virtually but her camera was turned off.  During exam she was pleasant, cooperative, and engaged in conversation.  She informed Clinical research associate that her hallucinations worsens at night.  She reports that she fog like images that are shaped a human.  Patient informed Clinical research associate that she is unsure if this is a spiritual thing.  [place] and notes that she is unsure if she is seeing spirits.  At her last visit she notes that a photo helped with hallucinations but now notes that it no longer helps.  She also reports that she sees images in the TV.   Since her last visit she informed writer that her anxiety and depression has worsened.  Provider conducted a GAD-7 and patient scored a 15, at her last visit she scored a 4.  Provider also conducted PHQ-9 patient scored 12, at her last visit she scored a 2.  She endorses adequate sleep and  appetite.  Today she denies SI/HI, mania, paranoia.   Patient informed Clinical research associate that she recently found out that she has arthritis.  Today she quantifies her pain as 3 out of 10.  She reports that she will be getting cortisone injections soon to help manage this.   Today patient agreeable to increasing Seroquel to 250 mg at night to help manage symptoms of psychosis, anxiety, depression.  Patient notes that she is sleeping well with Ambien.  Provider informed patient to decrease her Ambien consumption to see if she sleeps well without a and to see if her psychosis reduces. She endorsed understanding and agreed. She was given a quantity of 15 as her dose was reduced.   She will continue all other medications as prescribed.  No other concerns noted at the time.        Visit Diagnosis:    ICD-10-CM   1. PTSD (post-traumatic stress disorder)  F43.10 prazosin (MINIPRESS) 2 MG capsule    2. Schizoaffective disorder, bipolar type (HCC)  F25.0 QUEtiapine (SEROQUEL) 50 MG tablet    QUEtiapine (SEROQUEL XR) 200 MG 24 hr tablet    3. Insomnia due to other mental disorder  F51.05 zolpidem (AMBIEN) 5 MG tablet   F99 DISCONTINUED: zolpidem (AMBIEN) 5 MG tablet        Past Psychiatric History: Schizoaffective disorder, Bipolar 1, Depression, Anxiety, and PTSD  Past Medical History:  Past Medical History:  Diagnosis Date   Bipolar 1 disorder (HCC)  Gallstone    Herpes simplex virus (HSV) infection    dx 2001   Polycystic ovary disease    PTSD (post-traumatic stress disorder)    Schizophrenia (HCC)    Stroke [facility])     Past Surgical History:  Procedure Laterality Date   BUBBLE STUDY  05/25/2020   Procedure: BUBBLE STUDY;  Surgeon: Chrystie Nose, MD;  Location[facility] ENDOSCOPY;  Service: Cardiovascular;;   NO PAST SURGERIES     TEE WITHOUT CARDIOVERSION N/A 05/25/2020   Procedure: TRANSESOPHAGEAL ECHOCARDIOGRAM (TEE);  Surgeon: Chrystie Nose, MD;  Location: [facility] ENDOSCOPY;  Service:  Cardiovascular;  Laterality: N/A;    Family Psychiatric History: Bipolar, Schizophrenia, and Depressed. Father Bipolar disorder.   Family History:  Family History  Problem Relation Age of Onset   Bipolar disorder Mother    Diabetes Maternal Grandmother    Hypertension Neg Hx    Heart disease Neg Hx    Cancer Neg Hx     Social History:  Social History   Socioeconomic History   Marital status: Single    Spouse name: Not on file   Number of children: Not on file   Years of education: Not on file   Highest education level: Not on file  Occupational History   Not on file  Tobacco Use   Smoking status: Former    Current packs/day: 0.00    Average packs/day: 0.3 packs/day for 15.0 years (3.8 ttl pk-yrs)    Types: Cigarettes    Start date: 05/02/2005    Quit date: 05/02/2020    Years since quitting: 2.5   Smokeless tobacco: Never  Vaping Use   Vaping status: Never Used  Substance and Sexual Activity   Alcohol use: No   Drug use: No   Sexual activity: Not Currently    Birth control/protection: Abstinence  Other Topics Concern   Not on file  Social History Narrative   Not on file   Social Determinants of Health   Financial Resource Strain: Low Risk  (09/09/2022)   Received from [facility]   Overall Financial Resource Strain (CARDIA)    Difficulty of Paying Living Expenses: Not hard at all  Food Insecurity: No Food Insecurity (09/09/2022)   Received from [facility]   Hunger Vital Sign    Worried About Running Out of Food in the Last Year: Never true    Ran Out of Food in the Last Year: Never true  Transportation Needs: No Transportation Needs (09/09/2022)   Received from [facility] - Transportation    Lack of Transportation (Medical): No    Lack of Transportation (Non-Medical): No  Physical Activity: Unknown (09/09/2022)   Received from [facility]   Exercise Vital Sign    Days of Exercise per Week: 0 days    Minutes of Exercise per Session: Not on  file  Stress: No Stress Concern Present (09/09/2022)   Received from [facility] - Occupational Stress Questionnaire    Feeling of Stress : Only a little  Social Connections: Socially Integrated (09/09/2022)   Received from [facility]   Social Network    How would you rate your social network (family, work, friends)?: Good participation with social networks    Allergies: No Known Allergies  Metabolic Disorder Labs: Lab Results  Component Value Date   HGBA1C 5.6 08/15/2022   MPG 117 06/15/2020   MPG 128.37 05/24/2020   Lab Results  Component Value Date  PROLACTIN 13.0 04/28/2015   Lab Results  Component Value Date   CHOL 202 (H) 08/15/2022   TRIG 72 08/15/2022   HDL 62 08/15/2022   CHOLHDL 3.3 08/15/2022   VLDL 28 05/24/2020   LDLCALC 127 (H) 08/15/2022   LDLCALC 114 (H) 07/17/2021   Lab Results  Component Value Date   TSH 1.29 11/18/2022   TSH 2.110 04/02/2020    Therapeutic Level Labs: No results found for: "LITHIUM" No results found for: "VALPROATE" No results found for: "CBMZ"  Current Medications: Current Outpatient Medications  Medication Sig Dispense Refill   acetaminophen (TYLENOL) 500 MG tablet Take 1,000 mg by mouth every 6 (six) hours as needed for moderate pain.     APAP-Pamabrom-Pyrilamine (PAMPRIN MAX PAIN FORMULA PO) Take 2 tablets by mouth every 6 (six) hours as needed (menstrual cramps).     atorvastatin (LIPITOR) 10 MG tablet Take 1 tablet (10 mg total) by mouth daily. 90 tablet 3   celecoxib (CELEBREX) 100 MG capsule SMARTSIG:1 Capsule(s) By Mouth 1-2 Times Daily PRN     metFORMIN (GLUCOPHAGE) 500 MG tablet Take 1 tablet (500 mg total) by mouth once daily with breakfast. 60 tablet 3   minoxidil (LONITEN) 2.5 MG tablet Take 2.5 mg by mouth 2 (two) times daily.     prazosin (MINIPRESS) 2 MG capsule Take 1 capsule (2 mg total) by mouth at bedtime. 30 capsule 3   QUEtiapine (SEROQUEL XR) 200 MG 24 hr  tablet Take 1 tablet (200 mg total) by mouth at bedtime. 30 tablet 3   QUEtiapine (SEROQUEL) 50 MG tablet Take 1 tablet (50 mg total) by mouth 2 (two) times daily. 60 tablet 3   Semaglutide, 2 MG/DOSE, (OZEMPIC, 2 MG/DOSE,) 8 MG/3ML SOPN Inject 2 mg as directed once a week. Must have office visit for refills 3 mL 2   spironolactone (ALDACTONE) 100 MG tablet Take 1 tablet (100 mg total) by mouth 2 (two) times daily. 390 tablet 2   tiZANidine (ZANAFLEX) 4 MG tablet TAKE 1 TABLET(4 MG) BY MOUTH EVERY 6 HOURS AS NEEDED FOR MUSCLE SPASMS 30 tablet 1   valACYclovir (VALTREX) 1000 MG tablet Take 1 tablet (1,000 mg total) by mouth once daily. 60 tablet 2   zolpidem (AMBIEN) 5 MG tablet Take 1 tablet (5 mg total) by mouth at bedtime as needed for sleep. 15 tablet 2   No current facility-administered medications for this visit.     Musculoskeletal: Strength & Muscle Tone:  Unable to assess due to patients camera off Gait & Station:  Unable to assess due topatients camera off Patient leans: N/A  Psychiatric Specialty Exam: Review of Systems  Last menstrual period 10/27/2022.There is no height or weight on file to calculate BMI.  General Appearance:  Unable to assess due to patients camera off  Eye Contact:   Unable to assess due to patients camera off  Speech:  Clear and Coherent and Normal Rate  Volume:  Normal  Mood:  Anxious and Depressed  Affect:  Congruent  Thought Process:  Coherent, Goal Directed and Linear  Orientation:  Full (Time, Place, and Person)  Thought Content: Logical and Hallucinations: Auditory Visual   Suicidal Thoughts:  No  Homicidal Thoughts:  No  Memory:  Immediate;   Good Recent;   Good Remote;   Good  Judgement:  Good  Insight:  Good  Psychomotor Activity:  Normal  Concentration:  Concentration: Good and Attention Span: Good  Recall:  Good  Fund of Knowledge: Good  Language:  Good  Akathisia:   Unable to assess duepatients camera off  Handed:  Right  AIMS (if  indicated): Not done  Assets:  Communication Skills Desire for Improvement Housing Leisure Time Social Support  ADL's:  Intact  Cognition: WNL  Sleep:  Good   Screenings: AUDIT    Flowsheet Row [facility]  Alcohol Use Disorder Identification Test Final Score (AUDIT) 0      GAD-7    Flowsheet Row Video Visit from 11/20/2022 in [facility] Video Visit from 08/26/2022 in [facility] Office Visit from 08/15/2022 in [facility] [place] Video Visit from 07/08/2022 in [facility] Office Visit from [facility]/20[facility]  Total GAD-7 Score 15 4 3 2 7       PHQ2-9    Flowsheet Row Video Visit from 11/20/2022 in [facility] Video Visit from 08/26/2022 in [facility] Office Visit from 08/15/2022 in [facility] Video Visit from 07/08/2022 in [facility] Office Visit from 02/19/2022 in [facility]  PHQ-2 Total Score 4 0 3 0 0  PHQ-9 Total Score 12 2 7 7  0      Flowsheet Row ED from 06/15/2022 in [facility] Emergency Department at [facility] ED from 05/22/2021 in [facility] Department at [facility] ED from 04/11/2021 in [facility] Emergency Department at [facility]  C-SSRS RISK CATEGORY No Risk No Risk No Risk        Assessment and Plan: Patient reports feeling more anxious, depressed, and experiencing hallucinations.  He reports that her hallucinations worsen at night.Today patient agreeable to increasing Seroquel to 250 mg at night to help manage symptoms of psychosis, anxiety, depression.   Patient notes that she is sleeping well with Ambien.  Provider informed patient to decrease her Ambien consumption to see if she sleeps well without a and to see if her psychosis reduces. She endorsed understanding and agreed. She was given a quantity of 15 as her dose was reduced.   She will continue all other medications as prescribe  1. PTSD (post-traumatic stress disorder)  Continue- prazosin (MINIPRESS) 2 MG capsule; Take 1 capsule (2 mg total) by mouth at bedtime.  Dispense: 30 capsule; Refill: 3  2. Schizoaffective disorder, bipolar type (HCC)  Increased- QUEtiapine (SEROQUEL) 50 MG tablet; Take 1 tablet (50 mg total) by mouth 2 (two) times daily.  Dispense: 60 tablet; Refill: 3 Continue- QUEtiapine (SEROQUEL XR) 200 MG 24 hr tablet; Take 1 tablet (200 mg total) by mouth at bedtime.  Dispense: 30 tablet; Refill: 3  3. Insomnia due to other mental disorder  3 reduced- zolpidem (AMBIEN) 5 MG tablet; Take 1 tablet (5 mg total) by mouth at bedtime as needed for sleep.  Dispense: 15 tablet; Refill: 2    Follow up in 2.5 months Follow-up with therapy   Shanna Cisco, NP 11/20/2022, 12:23 PM

## 2022-11-21 LAB — CYTOLOGY - PAP
Comment: NEGATIVE
Diagnosis: NEGATIVE
High risk HPV: NEGATIVE

## 2022-11-23 ENCOUNTER — Other Ambulatory Visit: Payer: Self-pay

## 2022-11-23 ENCOUNTER — Emergency Department (HOSPITAL_COMMUNITY)
Admission: EM | Admit: 2022-11-23 | Discharge: 2022-11-24 | Disposition: A | Discharge status: Home / Self Care | Payer: Medicare PPO | Attending: Emergency Medicine | Admitting: Emergency Medicine

## 2022-11-23 ENCOUNTER — Encounter (HOSPITAL_COMMUNITY): Payer: Self-pay | Admitting: Emergency Medicine

## 2022-11-23 DIAGNOSIS — M79629 Pain in unspecified upper arm: Secondary | ICD-10-CM | POA: Insufficient documentation

## 2022-11-23 DIAGNOSIS — Z7984 Long term (current) use of oral hypoglycemic drugs: Secondary | ICD-10-CM | POA: Insufficient documentation

## 2022-11-23 DIAGNOSIS — R69 Illness, unspecified: Secondary | ICD-10-CM | POA: Insufficient documentation

## 2022-11-23 DIAGNOSIS — M79621 Pain in right upper arm: Secondary | ICD-10-CM | POA: Diagnosis not present

## 2022-11-23 NOTE — ED Triage Notes (Signed)
Pt noticed a lump to R axilla 1 week ago, has fluctuated in size, no drainage, tender. Denies redness, fever.

## 2022-11-24 ENCOUNTER — Other Ambulatory Visit: Payer: Self-pay

## 2022-11-24 ENCOUNTER — Telehealth: Payer: Self-pay | Admitting: *Deleted

## 2022-11-24 LAB — TESTOS,TOTAL,FREE AND SHBG (FEMALE)
Free Testosterone: 2.2 pg/mL (ref 0.1–6.4)
Sex Hormone Binding: 57.7 nmol/L (ref 17–124)
Testosterone, Total, LC-MS-MS: 20 ng/dL (ref 2–45)

## 2022-11-24 LAB — THYROID PANEL WITH TSH
Free Thyroxine Index: 1.8 (ref 1.4–3.8)
T3 Uptake: 33 % (ref 22–35)
T4, Total: 5.5 ug/dL (ref 5.1–11.9)
TSH: 1.29 m[IU]/L

## 2022-11-24 MED ORDER — AMOXICILLIN 500 MG PO CAPS
500.0000 mg | ORAL_CAPSULE | Freq: Three times a day (TID) | ORAL | 0 refills | Status: DC
  Filled 2022-11-24: qty 6, 2d supply, fill #0

## 2022-11-24 NOTE — Addendum Note (Signed)
Addended by: Darrell Jewel V on: 11/24/2022 04:52 PM   Modules accepted: Orders

## 2022-11-24 NOTE — Telephone Encounter (Signed)
Spoke with patient. Patient asking if estradiol or FSH can be added to labs drawn on 11/18/22.    Message to [employer] inquiring.   Dr. Kennith Center, please advise if these additional labs can be added?

## 2022-11-24 NOTE — ED Provider Notes (Addendum)
Sarah Evans   CSN: [id number] Arrival date & time: 11/23/22  2333     History  No chief complaint on file.   Sarah Evans is a 42 y.o. female.  The history is provided by the patient.  Illness Location:  B axilla Quality:  Patient reports swelling that fluctuates Severity:  Moderate Onset quality:  Gradual Duration:  1 week Timing:  Constant Progression:  Waxing and waning Chronicity:  New Context:  Is on antibiotics for dental infection Relieved by:  Nothing Worsened by:  Nothing Ineffective treatments:  None Associated symptoms: no fever, no rash and no wheezing        Home Medications Prior to Admission medications   Medication Sig Start Date End Date Taking? Authorizing Provider  amoxicillin (AMOXIL) 500 MG capsule Take 1 capsule (500 mg total) by mouth 3 (three) times daily. 11/24/22  Yes Maija Biggers, MD  acetaminophen (TYLENOL) 500 MG tablet Take 1,000 mg by mouth every 6 (six) hours as needed for moderate pain.    [provider]  APAP-Pamabrom-Pyrilamine (PAMPRIN MAX PAIN FORMULA PO) Take 2 tablets by mouth every 6 (six) hours as needed (menstrual cramps).    [provider]  atorvastatin (LIPITOR) 10 MG tablet Take 1 tablet (10 mg total) by mouth daily. 08/15/22   Storm Frisk, MD  celecoxib (CELEBREX) 100 MG capsule SMARTSIG:1 Capsule(s) By Mouth 1-2 Times Daily PRN    [provider]  metFORMIN (GLUCOPHAGE) 500 MG tablet Take 1 tablet (500 mg total) by mouth once daily with breakfast. 08/19/22   Storm Frisk, MD  minoxidil (LONITEN) 2.5 MG tablet Take 2.5 mg by mouth 2 (two) times daily. 11/03/22   [provider]  prazosin (MINIPRESS) 2 MG capsule Take 1 capsule (2 mg total) by mouth at bedtime. 11/20/22   Shanna Cisco, NP  QUEtiapine (SEROQUEL XR) 200 MG 24 hr tablet Take 1 tablet (200 mg total) by mouth at bedtime. 11/20/22   Shanna Cisco, NP  QUEtiapine (SEROQUEL) 50 MG tablet Take 1 tablet (50 mg total) by mouth 2 (two) times daily. 11/20/22   Toy Cookey E, NP  Semaglutide, 2 MG/DOSE, (OZEMPIC, 2 MG/DOSE,) 8 MG/3ML SOPN Inject 2 mg as directed once a week. Must have office visit for refills 08/15/22   Storm Frisk, MD  spironolactone (ALDACTONE) 100 MG tablet Take 1 tablet (100 mg total) by mouth 2 (two) times daily. 08/19/22   Storm Frisk, MD  tiZANidine (ZANAFLEX) 4 MG tablet TAKE 1 TABLET(4 MG) BY MOUTH EVERY 6 HOURS AS NEEDED FOR MUSCLE SPASMS 11/05/22   Hoy Register, MD  valACYclovir (VALTREX) 1000 MG tablet Take 1 tablet (1,000 mg total) by mouth once daily. 08/15/22   Storm Frisk, MD  zolpidem (AMBIEN) 5 MG tablet Take 1 tablet (5 mg total) by mouth at bedtime as needed for sleep. 11/20/22   Shanna Cisco, NP      Allergies    Patient has no known allergies.    Review of Systems   Review of Systems  Constitutional:  Negative for fever.  HENT:  Negative for facial swelling.   Eyes:  Negative for photophobia.  Respiratory:  Negative for wheezing and stridor.   Skin:  Negative for rash.  All other systems reviewed and are negative.   Physical Exam Updated Vital Signs BP 108/82 (BP Location: Left Arm)   Pulse 98   Temp 98.5 F (36.9  C) (Oral)   Resp 18   Ht 5' 7.5" (1.715 m)   Wt 67.1 kg   LMP 10/27/2022 (Approximate)   SpO2 94%   BMI 22.84 kg/m  Physical Exam Vitals and nursing Evans reviewed.  Constitutional:      General: She is not in acute distress.    Appearance: Normal appearance. She is well-developed.  HENT:     Head: Normocephalic and atraumatic.     Nose: Nose normal.  Eyes:     Pupils: Pupils are equal, round, and reactive to light.  Cardiovascular:     Rate and Rhythm: Normal rate and regular rhythm.     Pulses: Normal pulses.     Heart sounds: Normal heart sounds.  Pulmonary:     Effort: Pulmonary effort is normal. No respiratory distress.     Breath  sounds: Normal breath sounds.  Abdominal:     General: Bowel sounds are normal. There is no distension.     Palpations: Abdomen is soft.     Tenderness: There is no abdominal tenderness. There is no guarding or rebound.  Musculoskeletal:        General: Normal range of motion.     Cervical back: Normal range of motion and neck supple.  Lymphadenopathy:     Head:     Right side of head: No preauricular or posterior auricular adenopathy.     Left side of head: No preauricular or posterior auricular adenopathy.     Cervical: No cervical adenopathy.     Upper Body:     Right upper body: No supraclavicular or axillary adenopathy.     Left upper body: No supraclavicular or axillary adenopathy.  Skin:    General: Skin is warm and dry.     Capillary Refill: Capillary refill takes less than 2 seconds.     Findings: No erythema or rash.  Neurological:     General: No focal deficit present.     Mental Status: She is alert and oriented to person, place, and time.     Deep Tendon Reflexes: Reflexes normal.  Psychiatric:        Mood and Affect: Mood normal.     ED Results / Procedures / Treatments   Labs (all labs ordered are listed, but only abnormal results are displayed) Labs Reviewed - No data to display  EKG None  Radiology No results found.  Procedures Procedures    Medications Ordered in ED Medications - No data to display  ED Course/ Medical Decision Making/ A&P                                 Medical Decision Making Patient with concerns for B axillary swelling   Amount and/or Complexity of Data Reviewed External Data Reviewed: notes.    Details: Previous notes reviewed   Risk Prescription drug management. Risk Details: No LAN, well appearing on 5 days of antibiotics.  Will add 2 more.  Warm compresses, no shaving or using anti-persperant.  Stable for discharge.      Final Clinical Impression(s) / ED Diagnoses Final diagnoses:  Pain in axilla, unspecified  laterality   Return for intractable cough, coughing up blood, fevers > 100.4 unrelieved by medication, shortness of breath, intractable vomiting, chest pain, shortness of breath, weakness, numbness, changes in speech, facial asymmetry, abdominal pain, passing out, Inability to tolerate liquids or food, cough, altered mental status or any concerns. No signs of  systemic illness or infection. The patient is nontoxic-appearing on exam and vital signs are within normal limits.  I have reviewed the triage vital signs and the nursing notes. Pertinent labs & imaging results that were available during my care of the patient were reviewed by me and considered in my medical decision making (see chart for details). After history, exam, and medical workup I feel the patient has been appropriately medically screened and is safe for discharge home. Pertinent diagnoses were discussed with the patient. Patient was given return precautions.  Rx / DC Orders ED Discharge Orders          Ordered    amoxicillin (AMOXIL) 500 MG capsule  3 times daily        11/24/22 0039                 Louvenia Golomb, MD 11/24/22 0347

## 2022-11-25 ENCOUNTER — Other Ambulatory Visit: Payer: Self-pay

## 2022-11-25 ENCOUNTER — Ambulatory Visit (INDEPENDENT_AMBULATORY_CARE_PROVIDER_SITE_OTHER): Payer: Medicare PPO | Admitting: Physical Medicine and Rehabilitation

## 2022-11-25 DIAGNOSIS — M47816 Spondylosis without myelopathy or radiculopathy, lumbar region: Secondary | ICD-10-CM

## 2022-11-25 MED ORDER — METHYLPREDNISOLONE ACETATE 40 MG/ML IJ SUSP
40.0000 mg | Freq: Once | INTRAMUSCULAR | Status: AC
  Administered 2022-11-25: 40 mg

## 2022-11-25 NOTE — Patient Instructions (Signed)

## 2022-11-25 NOTE — Telephone Encounter (Signed)
Reviewed with lab, unable to add estradiol.  Left message to call [facility] Triage at [phone], option 4.

## 2022-11-25 NOTE — Telephone Encounter (Signed)
Spoke with patient, advised per Dr. Kennith Center. Patient is requesting all hormone levels be drawn due to History of PCOS. She is specifically looking for "androgens, DHEAS, AMH, LH, FSH, estrogen, prolactin, Androstenedione and FHBG". Patient states if unable to complete at [facility] would like referral to [facility].   Advised I will forward request to Dr. Kennith Center and f/u with recommendations. Patient agreeable.

## 2022-11-25 NOTE — Telephone Encounter (Signed)
Patient left message requesting return call.  

## 2022-11-25 NOTE — Progress Notes (Signed)
Functional Pain Scale - descriptive words and definitions  Intense (8)    Cannot complete any ADLs without much assistance/cannot concentrate/conversation is difficult/unable to sleep and unable to use distraction. Severe range order  Average Pain 8 No numbness / tingling.  Pain Is Bil   +Driver, -BT, -Dye Allergies.

## 2022-11-26 NOTE — Telephone Encounter (Signed)
Spoke with patient, advised per Dr. Kennith Center. OV scheduled for 12/2 at 1600.   Routing to provider for final review. Patient is agreeable to disposition. Will close encounter.

## 2022-11-27 ENCOUNTER — Ambulatory Visit: Payer: Medicare PPO | Admitting: Orthopedic Surgery

## 2022-11-29 ENCOUNTER — Other Ambulatory Visit: Payer: Self-pay | Admitting: Critical Care Medicine

## 2022-11-30 ENCOUNTER — Other Ambulatory Visit: Payer: Self-pay

## 2022-12-01 ENCOUNTER — Other Ambulatory Visit: Payer: Self-pay

## 2022-12-01 MED ORDER — OZEMPIC (2 MG/DOSE) 8 MG/3ML ~~LOC~~ SOPN
2.0000 mg | PEN_INJECTOR | SUBCUTANEOUS | 0 refills | Status: DC
  Filled 2022-12-01: qty 3, 28d supply, fill #0

## 2022-12-01 NOTE — Progress Notes (Deleted)
Annual Wellness Visit     Patient: Sarah Evans, Female    DOB: Apr 20, 1980, 42 y.o.   MRN: [id number]  Subjective  No chief complaint on file.   Sarah Evans is a 42 y.o. female who presents today for her Annual Wellness Visit. She reports consuming a  vegetarian  diet. The patient does not participate in regular exercise at present. She generally feels fairly well. She reports sleeping poorly. She does have additional problems to discuss today.   10/2020 [name] presents for assistance with filling out mental and physical residual functional capacity assessments.  The patient had a significant stroke earlier this year leaving the patient with left upper extremity weakness and pain.  She also has chronic midthoracic and neck pain as well.  Patient has hyperlipidemia with type 2 diabetes and severe bipolar disorder and severe depression.  She has had psychotic features but not full-blown schizophrenia.  Despite treatment with mental health her depression and bipolar disorder have been significantly impacting her abilities.    The patient has not been able to perform her job and has been out of work for some time.  She sits at home is not able to engage in any social interactions.  Note she is not suicidal.  She was not able to focus on her job and the back pain and left upper extremity weakness and pain have created a situation where she drops objects frequently.  She also has left leg weakness cannot walk.  She has chronic headaches dizziness and feels faint.  The patient also has visual acuity issues in the left eye after the stroke  On arrival blood pressure is good 118/80.  Her diabetes has been reasonably controlled on metformin alone.  She is on statin therapy along with medication for blood pressure.  Minimal residual functional assessment is done as outlined below and per the forms functional residual capacity also is done per forms below.  6/15   This patient is seen today  by way of a video visit.  She is in her bedroom at home.  She is in no distress on the video.  Her chief complaint is that of increased weight gain and considering potential Ozempic.  Unfortunately she only has family-planning Medicaid.  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  She only walks about 3000 steps a day.  She eats a lot of vegetables in her diet.  Her weight has increased and her BMI is 30.  Patient does have prediabetes.  A1c has been elevated in the past needs to be rechecked.  Patient's had previous stroke and is on statin therapy.  Was in the ER in May for abdominal pain found to have cholelithiasis.  She was to follow-up with general surgery for potential resection but this is yet to occur.  Patient also has bipolar disorder and needs refills on her Seroquel.  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  There are no other complaints. See Epworth scale total score is 15 the patient was given Norco in the emergency room in May she still has a bottle of medicine and takes it rarely Patient had a Pap smear in May was normal  07/24/21 This patient is seen in return follow-up and notes that her mental health is improved on her current program and [facility]  She now does have the [place] discount available.  Also has the orange card.  On arrival blood pressure 107/70.  She still having heavy menstrual periods and would like a prescription for birth control medications.  We have sent that to our pharmacy downstairs.  Patient also saw gynecology earlier this year and had a normal Pap smear.  She is also on Aldactone for PCOS.  Patient still has hirsutism despite the Aldactone.  Patient is interested in topical therapy for hirsutism.  Patient complains of bilateral knee p/6  8/9 This patient presents for a welcome to Medicare exam and complaints  of twitching of the left eye recurrent atopic dermatitis bilateral knee pain low back pain alopecia previous stye in left eye now resolved.  She needs a letter to allow a service animal to be in her home.  Patient needs a foot exam and urine for albumin The patient also complains of decreased hearing bilaterally  12/03/22          Medications: Outpatient Medications Prior to Visit  Medication Sig   acetaminophen (TYLENOL) 500 MG tablet Take 1,000 mg by mouth every 6 (six) hours as needed for moderate pain.   amoxicillin (AMOXIL) 500 MG capsule Take 1 capsule (500 mg total) by mouth 3 (three) times daily.   APAP-Pamabrom-Pyrilamine (PAMPRIN MAX PAIN FORMULA PO) Take 2 tablets by mouth every 6 (six) hours as needed (menstrual cramps).   atorvastatin (LIPITOR) 10 MG tablet Take 1 tablet (10 mg total) by mouth daily.   celecoxib (CELEBREX) 100 MG capsule SMARTSIG:1 Capsule(s) By Mouth 1-2 Times Daily PRN   metFORMIN (GLUCOPHAGE) 500 MG tablet Take 1 tablet (500 mg total) by mouth once daily with breakfast.   minoxidil (LONITEN) 2.5 MG tablet Take 2.5 mg by mouth 2 (two) times daily.   prazosin (MINIPRESS) 2 MG capsule Take 1 capsule (2 mg total) by mouth at bedtime.   QUEtiapine (SEROQUEL XR) 200 MG 24 hr tablet Take 1 tablet (200 mg total) by mouth at bedtime.   QUEtiapine (SEROQUEL) 50 MG tablet Take 1 tablet (50 mg total) by mouth 2 (two) times daily.   Semaglutide, 2 MG/DOSE, (OZEMPIC, 2 MG/DOSE,) 8 MG/3ML SOPN Inject 2 mg as directed once a week. Must have office visit for refills   spironolactone (ALDACTONE) 100 MG tablet Take 1 tablet (100 mg total) by mouth 2 (two) times daily.   tiZANidine (ZANAFLEX) 4 MG tablet TAKE 1 TABLET(4 MG) BY MOUTH EVERY 6 HOURS AS NEEDED FOR MUSCLE SPASMS   valACYclovir (VALTREX) 1000 MG tablet Take 1 tablet (1,000 mg total) by mouth once daily.   zolpidem (AMBIEN) 5 MG tablet Take 1 tablet (5 mg total) by mouth at bedtime as needed for sleep.   No  facility-administered medications prior to visit.    No Known Allergies  Patient Care Team: Storm Frisk, MD as PCP - General (Pulmonary Disease)  Review of Systems  Constitutional:  Negative for chills, diaphoresis, fever, malaise/fatigue and weight loss.  HENT:  Negative for congestion, hearing loss, nosebleeds, sore throat and tinnitus.   Eyes:  Positive for blurred vision and pain. Negative for photophobia and redness.  Respiratory:  Negative for cough, hemoptysis, sputum production, shortness of breath, wheezing and stridor.   Cardiovascular:  Negative for chest pain, palpitations, orthopnea, claudication, leg swelling and PND.  Gastrointestinal:  Negative for abdominal pain, blood in stool, constipation, diarrhea, heartburn, nausea and vomiting.  Genitourinary:  Negative for dysuria, flank pain, frequency,  hematuria and urgency.  Musculoskeletal:  Positive for back pain. Negative for falls, joint pain, myalgias and neck pain.  Skin:  Negative for itching and rash.  Neurological:  Negative for dizziness, tingling, tremors, sensory change, speech change, focal weakness, seizures, loss of consciousness, weakness and headaches.  Endo/Heme/Allergies:  Negative for environmental allergies and polydipsia. Does not bruise/bleed easily.  Psychiatric/Behavioral:  Negative for depression, memory loss, substance abuse and suicidal ideas. The patient is not nervous/anxious and does not have insomnia.         Objective  LMP 10/27/2022 (Approximate)    Physical Exam Vitals reviewed.  Constitutional:      Appearance: Normal appearance. She is well-developed. She is not diaphoretic.  HENT:     Head: Normocephalic and atraumatic.     Right Ear: Tympanic membrane, ear canal and external ear normal.     Left Ear: External ear normal. There is impacted cerumen.     Nose: No nasal deformity, septal deviation, mucosal edema or rhinorrhea.     Right Sinus: No maxillary sinus tenderness or  frontal sinus tenderness.     Left Sinus: No maxillary sinus tenderness or frontal sinus tenderness.     Mouth/Throat:     Pharynx: No oropharyngeal exudate.  Eyes:     General: No scleral icterus.    Conjunctiva/sclera: Conjunctivae normal.     Pupils: Pupils are equal, round, and reactive to light.  Neck:     Thyroid: No thyromegaly.     Vascular: No carotid bruit or JVD.     Trachea: Trachea normal. No tracheal tenderness or tracheal deviation.  Cardiovascular:     Rate and Rhythm: Normal rate and regular rhythm.     Chest Wall: PMI is not displaced.     Pulses: Normal pulses. No decreased pulses.     Heart sounds: Normal heart sounds, S1 normal and S2 normal. Heart sounds not distant. No murmur heard.    No systolic murmur is present.     No diastolic murmur is present.     No friction rub. No gallop. No S3 or S4 sounds.  Pulmonary:     Effort: No tachypnea, accessory muscle usage or respiratory distress.     Breath sounds: No stridor. No decreased breath sounds, wheezing, rhonchi or rales.  Chest:     Chest wall: No tenderness.  Abdominal:     General: Bowel sounds are normal. There is no distension.     Palpations: Abdomen is soft. Abdomen is not rigid.     Tenderness: There is no abdominal tenderness. There is no guarding or rebound.  Musculoskeletal:        General: Normal range of motion.     Cervical back: Normal range of motion and neck supple. No edema, erythema or rigidity. No muscular tenderness. Normal range of motion.  Lymphadenopathy:     Head:     Right side of head: No submental or submandibular adenopathy.     Left side of head: No submental or submandibular adenopathy.     Cervical: No cervical adenopathy.  Skin:    General: Skin is warm and dry.     Coloration: Skin is not pale.     Findings: No rash.     Nails: There is no clubbing.     Comments: Alopecia appears improved  Neurological:     Mental Status: She is alert and oriented to person, place,  and time.     Sensory: No sensory deficit.  Psychiatric:  Speech: Speech normal.        Behavior: Behavior normal.       Most recent functional status assessment:    08/11/2022   12:36 PM  In your present state of health, do you have any difficulty performing the following activities:  Hearing? 1  Vision? 1  Difficulty concentrating or making decisions? 1  Walking or climbing stairs? 1  Dressing or bathing? 1  Doing errands, shopping? 1  Preparing Food and eating ? Y  Using the Toilet? Y  In the past six months, have you accidently leaked urine? N  Do you have problems with loss of bowel control? N  Managing your Medications? Y  Managing your Finances? Y  Housekeeping or managing your Housekeeping? Y   Most recent fall risk assessment:    11/18/2022    2:21 PM  Fall Risk   Falls in the past year? 0  Number falls in past yr: 0  Injury with Fall? 0  Risk for fall due to : No Fall Risks  Follow up Falls evaluation completed    Most recent depression screenings:    11/20/2022   10:44 AM 08/26/2022   12:19 PM  PHQ 2/9 Scores  PHQ - 2 Score    PHQ- 9 Score       Information is confidential and restricted. Go to Review Flowsheets to unlock data.   Most recent cognitive screening:     No data to display         Most recent Audit-C alcohol use screening    08/11/2022   12:36 PM  Alcohol Use Disorder Test (AUDIT)  1. How often do you have a drink containing alcohol? 0  3. How often do you have six or more drinks on one occasion? 0   A score of 3 or more in women, and 4 or more in men indicates increased risk for alcohol abuse, EXCEPT if all of the points are from question 1   Vision/Hearing Screen: No results found.    No results found for any visits on 12/03/22.    Assessment & Plan   Annual wellness visit done today including the all of the following: Reviewed patient's Family Medical History Reviewed and updated list of patient's medical  providers Assessment of cognitive impairment was done Assessed patient's functional ability Established a written schedule for health screening services Health Risk Assessent Completed and Reviewed  Exercise Activities and Dietary recommendations  Goals   None     Immunization History  Administered Date(s) Administered   DTP 07/17/1982, 01/14/1985, 04/01/1985, 11/04/1985, 06/05/1987   Hep A / Hep B 02/12/2022, 04/22/2022   Hep B, Unspecified 10/11/1993, 11/13/1993, 04/23/1994   Influenza Inj Mdck Quad Pf 01/07/2018, 01/03/2022   Influenza, Seasonal, Injecte, Preservative Fre 11/27/2015   Influenza,inj,Quad PF,6+ Mos 10/26/2018, 11/01/2020   Influenza,inj,quad, With Preservative 10/14/2016   MMR 01/14/1985   OPV 07/17/1982, 01/14/1985, 04/01/1985, 11/04/1985, 06/05/1987   PFIZER Comirnaty(Gray Top)Covid-19 Tri-Sucrose Vaccine 03/19/2020   PFIZER(Purple Top)SARS-COV-2 Vaccination 06/10/2019, 07/12/2019   PPD Test 01/03/2014   Pfizer Covid-19 Vaccine Bivalent Booster 49yrs & up 09/26/2020   Pfizer(Comirnaty)Fall Seasonal Vaccine 12 years and older 01/03/2022   Tdap 11/28/2015, 01/07/2018    Health Maintenance  Topic Date Due   OPHTHALMOLOGY EXAM  Never done   Diabetic kidney evaluation - Urine ACR  05/21/2021   FOOT EXAM  07/25/2022   HEMOGLOBIN A1C  02/15/2023   Diabetic kidney evaluation - eGFR measurement  08/15/2023   Medicare Annual Wellness (  AWV)  08/15/2023   Cervical Cancer Screening (HPV/Pap Cotest)  11/18/2027   DTaP/Tdap/Td (8 - Td or Tdap) 01/08/2028   INFLUENZA VACCINE  Completed   COVID-19 Vaccine  Completed   HIV Screening  Completed   HPV VACCINES  Aged Out   Hepatitis C Screening  Discontinued     Discussed health benefits of physical activity, and encouraged her to engage in regular exercise appropriate for her age and condition.    Problem List Items Addressed This Visit   None    No follow-ups on file.     Shan Levans, MD

## 2022-12-02 ENCOUNTER — Other Ambulatory Visit: Payer: Self-pay

## 2022-12-02 NOTE — Progress Notes (Signed)
Sarah Evans - 42 y.o. female MRN [id number]  Date of birth: 07-09-1980  Office Visit Note: Visit Date: 11/25/2022 PCP: Storm Frisk, MD Referred by: London Sheer, MD  Subjective: Chief Complaint  Patient presents with   Lower Back - Pain   HPI:  Sarah Evans is a 42 y.o. female who comes in today at the request of Dr. Willia Craze for planned Right  L4-5 Lumbar facet/medial branch block with fluoroscopic guidance.  The patient has failed conservative care including home exercise, medications, time and activity modification.  This injection will be diagnostic and hopefully therapeutic.  Please see requesting physician notes for further details and justification.  Exam has shown concordant pain with facet joint loading.   ROS Otherwise per HPI.  Assessment & Plan: Visit Diagnoses:    ICD-10-CM   1. Spondylosis without myelopathy or radiculopathy, lumbar region  M47.816 methylPREDNISolone acetate (DEPO-MEDROL) injection 40 mg    XR C-ARM NO REPORT    Facet Injection      Plan: No additional findings.   Meds & Orders:  Meds ordered this encounter  Medications   methylPREDNISolone acetate (DEPO-MEDROL) injection 40 mg    Orders Placed This Encounter  Procedures   Facet Injection   XR C-ARM NO REPORT    Follow-up: Return for visit to requesting provider as needed.   Procedures: No procedures performed  Lumbar Facet Joint Intra-Articular Injection(s) with Fluoroscopic Guidance  Patient: Sarah Evans      Date of Birth: [birth year] MRN: [id number] PCP: Storm Frisk, MD      Visit Date: 11/25/2022   Universal Protocol:    Date/Time: 11/25/2022  Consent Given By: the patient  Position: PRONE   Additional Comments: Vital signs were monitored before and after the procedure. Patient was prepped and draped in the usual sterile fashion. The correct patient, procedure, and site was verified.   Injection Procedure Details:  Procedure Site One Meds  Administered:  Meds ordered this encounter  Medications   methylPREDNISolone acetate (DEPO-MEDROL) injection 40 mg     Laterality: Right  Location/Site:  L4-L5  Needle size: 22 guage  Needle type: Spinal  Needle Placement: Articular  Findings:  -Comments: Excellent flow of contrast producing a partial arthrogram.  Procedure Details: The fluoroscope beam is vertically oriented in AP, and the inferior recess is visualized beneath the lower pole of the inferior apophyseal process, which represents the target point for needle insertion. When direct visualization is difficult the target point is located at the medial projection of the vertebral pedicle. The region overlying each aforementioned target is locally anesthetized with a 1 to 2 ml. volume of 1% Lidocaine without Epinephrine.   The spinal needle was inserted into each of the above mentioned facet joints using biplanar fluoroscopic guidance. A 0.25 to 0.5 ml. volume of Isovue-250 was injected and a partial facet joint arthrogram was obtained. A single spot film was obtained of the resulting arthrogram.    One to 1.25 ml of the steroid/anesthetic solution was then injected into each of the facet joints noted above.   Additional Comments:  No complications occurred Dressing: 2 x 2 sterile gauze and Band-Aid    Post-procedure details: Patient was observed during the procedure. Post-procedure instructions were reviewed.  Patient left the clinic in stable condition.    Clinical History: CLINICAL DATA:  Mid-back pain, spondyloarthropathy suspected, xray done; Low back pain, symptoms persist with > 6 wks treatment.   EXAM: MRI THORACIC AND LUMBAR  SPINE WITHOUT CONTRAST   TECHNIQUE: Multiplanar and multiecho pulse sequences of the thoracic and lumbar spine were obtained without intravenous contrast.   COMPARISON:  Cervical and lumbar spine radiographs 05/30/2022.   FINDINGS: MRI THORACIC SPINE FINDINGS   Alignment:   Normal.   Vertebrae: No fracture, evidence of discitis, or bone lesion.   Cord:  Normal spinal cord signal and volume.   Paraspinal and other soft tissues: Cholelithiasis. Metal artifact from metallic foreign body in the right upper lung.   Disc levels:   No disc herniation, spinal canal stenosis or neural foraminal narrowing.   MRI LUMBAR SPINE FINDINGS   Segmentation:  Standard.   Alignment:  Normal.   Vertebrae:  No fracture, evidence of discitis, or bone lesion.   Conus medullaris and cauda equina: Conus extends to the L1 level. Conus and cauda equina appear normal.   Paraspinal and other soft tissues: Unremarkable.   Disc levels:   T12-L1:  Normal.   L1-L2:  Normal.   L2-L3:  Normal.   L3-L4:  Normal.   L4-L5: Small disc bulge and mild bilateral facet arthropathy. No spinal canal stenosis or neural foraminal narrowing.   L5-S1: No disc herniation, spinal canal stenosis or neural foraminal narrowing. Mild bilateral facet arthropathy.   IMPRESSION: 1. No spinal canal stenosis or neural foraminal narrowing in the thoracic or lumbar spine. 2. Mild bilateral facet arthropathy at L4-L5 and L5-S1. 3. Cholelithiasis.     Electronically Signed   By: Orvan Falconer M.D.   On: 11/10/2022 13:06     Objective:  VS:  HT:    WT:   BMI:     BP:   HR: bpm  TEMP: ( )  RESP:  Physical Exam Vitals and nursing note reviewed.  Constitutional:      General: She is not in acute distress.    Appearance: Normal appearance. She is not ill-appearing.  HENT:     Head: Normocephalic and atraumatic.     Right Ear: External ear normal.     Left Ear: External ear normal.  Eyes:     Extraocular Movements: Extraocular movements intact.  Cardiovascular:     Rate and Rhythm: Normal rate.     Pulses: Normal pulses.  Pulmonary:     Effort: Pulmonary effort is normal. No respiratory distress.  Abdominal:     General: There is no distension.     Palpations: Abdomen is  soft.  Musculoskeletal:        General: Tenderness present.     Cervical back: Neck supple.     Right lower leg: No edema.     Left lower leg: No edema.     Comments: Patient has good distal strength with no pain over the greater trochanters.  No clonus or focal weakness.  Skin:    Findings: No erythema, lesion or rash.  Neurological:     General: No focal deficit present.     Mental Status: She is alert and oriented to person, place, and time.     Sensory: No sensory deficit.     Motor: No weakness or abnormal muscle tone.     Coordination: Coordination normal.  Psychiatric:        Mood and Affect: Mood normal.        Behavior: Behavior normal.      Imaging: No results found.

## 2022-12-02 NOTE — Procedures (Signed)
Lumbar Facet Joint Intra-Articular Injection(s) with Fluoroscopic Guidance  Patient: Sarah Evans      Date of Birth: 07/28/1980 MRN: [id number] PCP: Storm Frisk, MD      Visit Date: 11/25/2022   Universal Protocol:    Date/Time: 11/25/2022  Consent Given By: the patient  Position: PRONE   Additional Comments: Vital signs were monitored before and after the procedure. Patient was prepped and draped in the usual sterile fashion. The correct patient, procedure, and site was verified.   Injection Procedure Details:  Procedure Site One Meds Administered:  Meds ordered this encounter  Medications   methylPREDNISolone acetate (DEPO-MEDROL) injection 40 mg     Laterality: Right  Location/Site:  L4-L5  Needle size: 22 guage  Needle type: Spinal  Needle Placement: Articular  Findings:  -Comments: Excellent flow of contrast producing a partial arthrogram.  Procedure Details: The fluoroscope beam is vertically oriented in AP, and the inferior recess is visualized beneath the lower pole of the inferior apophyseal process, which represents the target point for needle insertion. When direct visualization is difficult the target point is located at the medial projection of the vertebral pedicle. The region overlying each aforementioned target is locally anesthetized with a 1 to 2 ml. volume of 1% Lidocaine without Epinephrine.   The spinal needle was inserted into each of the above mentioned facet joints using biplanar fluoroscopic guidance. A 0.25 to 0.5 ml. volume of Isovue-250 was injected and a partial facet joint arthrogram was obtained. A single spot film was obtained of the resulting arthrogram.    One to 1.25 ml of the steroid/anesthetic solution was then injected into each of the facet joints noted above.   Additional Comments:  No complications occurred Dressing: 2 x 2 sterile gauze and Band-Aid    Post-procedure details: Patient was observed during the  procedure. Post-procedure instructions were reviewed.  Patient left the clinic in stable condition.

## 2022-12-03 ENCOUNTER — Ambulatory Visit: Payer: Medicare PPO | Admitting: Critical Care Medicine

## 2022-12-03 ENCOUNTER — Other Ambulatory Visit: Payer: Self-pay

## 2022-12-08 ENCOUNTER — Encounter: Payer: Self-pay | Admitting: Obstetrics and Gynecology

## 2022-12-08 ENCOUNTER — Ambulatory Visit (INDEPENDENT_AMBULATORY_CARE_PROVIDER_SITE_OTHER): Payer: Medicare PPO | Admitting: Obstetrics and Gynecology

## 2022-12-08 ENCOUNTER — Other Ambulatory Visit (HOSPITAL_COMMUNITY): Payer: Self-pay | Admitting: Psychiatry

## 2022-12-08 VITALS — BP 122/80 | HR 111 | Ht 67.0 in | Wt 148.0 lb

## 2022-12-08 DIAGNOSIS — E282 Polycystic ovarian syndrome: Secondary | ICD-10-CM | POA: Diagnosis not present

## 2022-12-08 DIAGNOSIS — Z113 Encounter for screening for infections with a predominantly sexual mode of transmission: Secondary | ICD-10-CM

## 2022-12-08 DIAGNOSIS — Z8742 Personal history of other diseases of the female genital tract: Secondary | ICD-10-CM | POA: Diagnosis not present

## 2022-12-08 DIAGNOSIS — B009 Herpesviral infection, unspecified: Secondary | ICD-10-CM

## 2022-12-08 DIAGNOSIS — Z789 Other specified health status: Secondary | ICD-10-CM

## 2022-12-08 DIAGNOSIS — F250 Schizoaffective disorder, bipolar type: Secondary | ICD-10-CM

## 2022-12-08 NOTE — Progress Notes (Signed)
42 y.o. G41P0100 female with HSV, bipolar/schizoaffective disorder, hx of PCOS here for PCOS f/u.   Patient's last menstrual period was 11/21/2022 (approximate).  Has regular cycle, lasts ~2d.  In relationship. Considering pregnancy in next year. Wants to make sure everything okay. Partner has 2 children. Desires STD screen. Birth control: abstinence Last PAP:     Component Value Date/Time   DIAGPAP  11/18/2022 1456    - Negative for intraepithelial lesion or malignancy (NILM)   DIAGPAP  05/30/2021 1616    - Negative for intraepithelial lesion or malignancy (NILM)   HPVHIGH Negative 11/18/2022 1456   HPVHIGH Negative 05/30/2021 1616   ADEQPAP  11/18/2022 1456    Satisfactory for evaluation; transformation zone component PRESENT.   ADEQPAP  05/30/2021 1616    Satisfactory for evaluation; transformation zone component PRESENT.   Sexually active: no, celibate x3 years  Exercising: Not currently exercising. Has lost about 50lb since using ozempic. Vegetarian diet.   GYN HISTORY: HSV PCOS  OB History  Gravida Para Term Preterm AB Living  1 1 0 1 0 0  SAB IAB Ectopic Multiple Live Births  0 0 0 0 1    # Outcome Date GA Lbr Len/2nd Weight Sex Type Anes PTL Lv  1 Preterm 11/27/15 [redacted]w[redacted]d       ND    Past Medical History:  Diagnosis Date   Bipolar 1 disorder (HCC)    Gallstone    Herpes simplex virus (HSV) infection    dx 2001   Polycystic ovary disease    PTSD (post-traumatic stress disorder)    Schizophrenia (HCC)    Stroke [facility])     Past Surgical History:  Procedure Laterality Date   BUBBLE STUDY  05/25/2020   Procedure: BUBBLE STUDY;  Surgeon: Chrystie Nose, MD;  Location: [facility] ENDOSCOPY;  Service: Cardiovascular;;   NO PAST SURGERIES     TEE WITHOUT CARDIOVERSION N/A 05/25/2020   Procedure: TRANSESOPHAGEAL ECHOCARDIOGRAM (TEE);  Surgeon: Chrystie Nose, MD;  Location: [facility] ENDOSCOPY;  Service: Cardiovascular;  Laterality: N/A;    Current Outpatient  Medications on File Prior to Visit  Medication Sig Dispense Refill   acetaminophen (TYLENOL) 500 MG tablet Take 1,000 mg by mouth every 6 (six) hours as needed for moderate pain.     amoxicillin (AMOXIL) 500 MG capsule Take 1 capsule (500 mg total) by mouth 3 (three) times daily. 6 capsule 0   APAP-Pamabrom-Pyrilamine (PAMPRIN MAX PAIN FORMULA PO) Take 2 tablets by mouth every 6 (six) hours as needed (menstrual cramps).     atorvastatin (LIPITOR) 10 MG tablet Take 1 tablet (10 mg total) by mouth daily. 90 tablet 3   celecoxib (CELEBREX) 100 MG capsule SMARTSIG:1 Capsule(s) By Mouth 1-2 Times Daily PRN     Fluocinolone Acetonide Scalp 0.01 % OIL SMARTSIG:Topical 3 Times a Week PRN     hydrocortisone (ANUSOL-HC) 25 MG suppository Place 25 mg rectally 2 (two) times daily.     metFORMIN (GLUCOPHAGE) 500 MG tablet Take 1 tablet (500 mg total) by mouth once daily with breakfast. 60 tablet 3   minoxidil (LONITEN) 2.5 MG tablet Take 2.5 mg by mouth 2 (two) times daily.     prazosin (MINIPRESS) 2 MG capsule Take 1 capsule (2 mg total) by mouth at bedtime. 30 capsule 3   QUEtiapine (SEROQUEL XR) 200 MG 24 hr tablet Take 1 tablet (200 mg total) by mouth at bedtime. 30 tablet 3   QUEtiapine (SEROQUEL) 50 MG tablet Take 1 tablet (  50 mg total) by mouth 2 (two) times daily. 60 tablet 3   Semaglutide, 2 MG/DOSE, (OZEMPIC, 2 MG/DOSE,) 8 MG/3ML SOPN Inject 2 mg as directed once a week. Must have office visit for refills 3 mL 0   spironolactone (ALDACTONE) 100 MG tablet Take 1 tablet (100 mg total) by mouth 2 (two) times daily. 390 tablet 2   tiZANidine (ZANAFLEX) 4 MG tablet TAKE 1 TABLET(4 MG) BY MOUTH EVERY 6 HOURS AS NEEDED FOR MUSCLE SPASMS 30 tablet 1   valACYclovir (VALTREX) 1000 MG tablet Take 1 tablet (1,000 mg total) by mouth once daily. 60 tablet 2   zolpidem (AMBIEN) 5 MG tablet Take 1 tablet (5 mg total) by mouth at bedtime as needed for sleep. 15 tablet 2   No current facility-administered medications  on file prior to visit.    Social History   Socioeconomic History   Marital status: Single    Spouse name: Not on file   Number of children: Not on file   Years of education: Not on file   Highest education level: Not on file  Occupational History   Not on file  Tobacco Use   Smoking status: Former    Current packs/day: 0.00    Average packs/day: 0.3 packs/day for 15.0 years (3.8 ttl pk-yrs)    Types: Cigarettes    Start date: 05/02/2005    Quit date: 05/02/2020    Years since quitting: 2.6   Smokeless tobacco: Never  Vaping Use   Vaping status: Never Used  Substance and Sexual Activity   Alcohol use: No   Drug use: No   Sexual activity: Not Currently    Birth control/protection: Abstinence  Other Topics Concern   Not on file  Social History Narrative   Not on file   Social Determinants of Health   Financial Resource Strain: Low Risk  (09/09/2022)   Received from [facility]   Overall Financial Resource Strain (CARDIA)    Difficulty of Paying Living Expenses: Not hard at all  Food Insecurity: No Food Insecurity (09/09/2022)   Received from [facility]   Hunger Vital Sign    Worried About Running Out of Food in the Last Year: Never true    Ran Out of Food in the Last Year: Never true  Transportation Needs: No Transportation Needs (09/09/2022)   Received from [facility] - Transportation    Lack of Transportation (Medical): No    Lack of Transportation (Non-Medical): No  Physical Activity: Unknown (09/09/2022)   Received from [facility]   Exercise Vital Sign    Days of Exercise per Week: 0 days    Minutes of Exercise per Session: Not on file  Stress: No Stress Concern Present (09/09/2022)   Received from [facility] - Occupational Stress Questionnaire    Feeling of Stress : Only a little  Social Connections: Socially Integrated (09/09/2022)   Received from [facility]   Social Network    How would you rate  your social network (family, work, friends)?: Good participation with social networks  Intimate Partner Violence: Not At Risk (09/09/2022)   Received from [facility]   HITS    Over the last 12 months how often did your partner physically hurt you?: Never    Over the last 12 months how often did your partner insult you or talk down to you?: Never    Over the last 12 months how often did your partner  threaten you with physical harm?: Never    Over the last 12 months how often did your partner scream or curse at you?: Never    Family History  Problem Relation Age of Onset   Bipolar disorder Mother    Diabetes Maternal Grandmother    Hypertension Neg Hx    Heart disease Neg Hx    Cancer Neg Hx     No Known Allergies    PE Today's Vitals   12/08/22 1551  BP: 122/80  Pulse: (!) 111  SpO2: 96%  Weight: 148 lb (67.1 kg)  Height: 5\' 7"  (1.702 m)   Body mass index is 23.18 kg/m.  Physical Exam Vitals reviewed.  Constitutional:      General: She is not in acute distress.    Appearance: Normal appearance.  HENT:     Head: Normocephalic and atraumatic.     Nose: Nose normal.  Eyes:     Extraocular Movements: Extraocular movements intact.     Conjunctiva/sclera: Conjunctivae normal.  Pulmonary:     Effort: Pulmonary effort is normal.  Musculoskeletal:        General: Normal range of motion.     Cervical back: Normal range of motion.  Neurological:     General: No focal deficit present.     Mental Status: She is alert.  Psychiatric:        Mood and Affect: Mood normal.        Behavior: Behavior normal.       Assessment and Plan:        History of PCOS Assessment & Plan: 2024 TVUS with PCOS appearing ovaries Pt requesting hormonal labs, reviewed normal testosterone levels and regular periods with weight loss. Would not recommend further general hormonal w/u Will check AMH given age and PCOS per pt request  Reports that she previously managed cycles with hormonal  therapy, however she discontinue 1 year ago and her cycles have remained regular. Reviewed that weight management can improve ovulation and menstrual regularity PCP checked labs this year: normal A1c, lipid panel improving Encouraged weight bearing exercise for maintenance of muscle mass All questions answered RTO for follicular TVUS  Orders: -     Anti-Mullerian Hormone [facility]), Female  Vegetarian diet -     Amb ref to Medical Nutrition Therapy-MNT  Screen for STD (sexually transmitted disease) -     Hepatitis B surface antigen -     Hepatitis C antibody -     RPR -     HIV Antibody (routine testing w rflx) -     C. trachomatis/N. gonorrhoeae RNA     Rosalyn Gess, MD

## 2022-12-08 NOTE — Assessment & Plan Note (Signed)
2024 TVUS with PCOS appearing ovaries Pt requesting hormonal labs, reviewed normal testosterone levels and regular periods with weight loss. Would not recommend further general hormonal w/u Will check AMH given age and PCOS per pt request  Reports that she previously managed cycles with hormonal therapy, however she discontinue 1 year ago and her cycles have remained regular. Reviewed that weight management can improve ovulation and menstrual regularity PCP checked labs this year: normal A1c, lipid panel improving Encouraged weight bearing exercise for maintenance of muscle mass All questions answered RTO for follicular TVUS

## 2022-12-08 NOTE — Patient Instructions (Signed)
Start a prenatal vitamin. Consider adding tumeric and fish oil for anti-inflammatory properties.

## 2022-12-09 ENCOUNTER — Telehealth: Payer: Self-pay | Admitting: *Deleted

## 2022-12-09 DIAGNOSIS — Z8742 Personal history of other diseases of the female genital tract: Secondary | ICD-10-CM

## 2022-12-09 LAB — C. TRACHOMATIS/N. GONORRHOEAE RNA
C. trachomatis RNA, TMA: NOT DETECTED
N. gonorrhoeae RNA, TMA: NOT DETECTED

## 2022-12-09 LAB — HIV ANTIBODY (ROUTINE TESTING W REFLEX): HIV 1&2 Ab, 4th Generation: NONREACTIVE

## 2022-12-09 LAB — HEPATITIS C ANTIBODY: Hepatitis C Ab: NONREACTIVE

## 2022-12-09 LAB — RPR: RPR Ser Ql: NONREACTIVE

## 2022-12-09 LAB — HEPATITIS B SURFACE ANTIGEN: Hepatitis B Surface Ag: NONREACTIVE

## 2022-12-09 NOTE — Telephone Encounter (Signed)
-----   Message from [place] sent at 12/09/2022  8:53 AM EST ----- Regarding: [place]  Return in about 3 weeks (around 12/29/2022) for TVUS, CD7-10, follicular count.  No [place] available here please schedule outside facility. Thanks

## 2022-12-10 NOTE — Telephone Encounter (Signed)
Patient left message stating she called to schedule PUS and was unable to schedule through [facility] Scheduling.   Call placed to scheduling, spoke with Joselyn Glassman. Confirmed PUS could be scheduled. Advised needs to be scheduled cycle day 7-10. He will contact patient directly to schedule.   Per review of EPIC, patient is scheduled for 12/22/22 at 1530 at [facility].   Routing FYI.   Encounter closed.

## 2022-12-10 NOTE — Telephone Encounter (Signed)
Order placed.  MyChart message to patient.

## 2022-12-12 LAB — ANTI-MULLERIAN HORMONE (AMH), FEMALE: Anti-Mullerian Hormones(AMH), Female: 13 ng/mL — ABNORMAL HIGH (ref 0.18–5.68)

## 2022-12-16 ENCOUNTER — Ambulatory Visit: Payer: Medicare PPO

## 2022-12-19 ENCOUNTER — Other Ambulatory Visit: Payer: Self-pay

## 2022-12-19 ENCOUNTER — Ambulatory Visit: Payer: Medicare PPO | Attending: Internal Medicine | Admitting: Internal Medicine

## 2022-12-19 ENCOUNTER — Encounter: Payer: Self-pay | Admitting: Internal Medicine

## 2022-12-19 VITALS — BP 107/75 | HR 105 | Temp 98.5°F | Ht 67.0 in | Wt 145.0 lb

## 2022-12-19 DIAGNOSIS — Z5986 Financial insecurity: Secondary | ICD-10-CM

## 2022-12-19 DIAGNOSIS — Z7985 Long-term (current) use of injectable non-insulin antidiabetic drugs: Secondary | ICD-10-CM

## 2022-12-19 DIAGNOSIS — R7303 Prediabetes: Secondary | ICD-10-CM | POA: Diagnosis not present

## 2022-12-19 DIAGNOSIS — R000 Tachycardia, unspecified: Secondary | ICD-10-CM | POA: Diagnosis not present

## 2022-12-19 DIAGNOSIS — E782 Mixed hyperlipidemia: Secondary | ICD-10-CM

## 2022-12-19 DIAGNOSIS — Z7689 Persons encountering health services in other specified circumstances: Secondary | ICD-10-CM | POA: Diagnosis not present

## 2022-12-19 MED ORDER — SEMAGLUTIDE (1 MG/DOSE) 4 MG/3ML ~~LOC~~ SOPN
1.0000 mg | PEN_INJECTOR | SUBCUTANEOUS | 2 refills | Status: DC
  Filled 2022-12-19: qty 3, 28d supply, fill #0
  Filled 2023-01-19: qty 3, 28d supply, fill #1

## 2022-12-19 NOTE — Patient Instructions (Addendum)
Decrease Ozempic to 1 mg once a week. Try to drink 4-8 glasses of water daily.

## 2022-12-19 NOTE — Progress Notes (Signed)
Patient ID: Sarah Evans, female    DOB: December 02, 1980  MRN: [id number]  CC: Hypertension (HTN f/u. Franchot Erichsen weight - pt reports that she has achieved weight loss goal/Already received flu vax.)   Subjective: Sarah Evans is a 42 y.o. female who presents for chronic ds management. Previous PCP was Dr. Delford Field who has retired. Her concerns today include:  Patient with history of prediabetes on Ozempic, HL, PCOS, GERD, obesity, chronic thoracic spine/neck pain BL hearing loss, OSA, schizoaffective bipolar type/PTSD/MDD/GAD  On Ozempic for wgh management, preDM.  Felt it help with PCOS as well.   On it for over a yr.  Wgh was 202 lbs; loss 57 lbs. Tolerating the med.  Reached her wgh goal.  Does not want to get any smaller. Wonders about decreasing the dose of Ozempic Tolerating the med; it has decrease her appetite.  Tries to eat healthy.  Does not exercise. PCOS:  on Minoxidil, Metformin and Spironolactone for PCOS through her GYN Dr. Kennith Center HL:  tolerating and taking Lipitor She is plugged in with behavioral health for her mental health diagnoses including schizoaffective bipolar type, PTSD, MDD and GAD.  Patient states she is vegan.  She tells me that her gynecologist recently told her that she should take turmeric to help with joint symptoms.  She tells me she is taking turmeric-curcumin and wanted to know if this is okay.  Patient with mild tachycardia on her vitals with heart rate of 105.  She denies any dizziness or palpitations.  She feels she can do better with drinking more water.  Patient Active Problem List   Diagnosis Date Noted   Well woman exam with routine gynecological exam 11/18/2022   Vision changes 08/16/2022   Bilateral hearing loss 08/16/2022   Chronic pain of both knees 07/24/2021   Sleep apnea 06/20/2021   Cholelithiasis 06/20/2021   History of PCOS 05/30/2021   Hirsutism 05/30/2021   Chronic neck pain 11/01/2020   Chronic thoracic spine pain 10/25/2020   GERD  without esophagitis 05/27/2020   Numbness and tingling 05/24/2020   History of stroke 05/23/2020   BMI 29.0-29.9,adult 05/21/2020   Herpes simplex vulvovaginitis 05/21/2020   Prediabetes 04/12/2020   Intrinsic eczema 04/12/2020   Mixed hyperlipidemia 04/12/2020   Muscle spasticity 04/12/2020   Moderate episode of recurrent major depressive disorder (HCC) 06/09/2019   Generalized anxiety disorder 06/09/2019   Personal history of nonsuicidal self-harm 04/28/2018   PTSD (post-traumatic stress disorder) 04/30/2015   Schizoaffective disorder, bipolar type (HCC) 04/27/2015     Current Outpatient Medications on File Prior to Visit  Medication Sig Dispense Refill   acetaminophen (TYLENOL) 500 MG tablet Take 1,000 mg by mouth every 6 (six) hours as needed for moderate pain.     amoxicillin (AMOXIL) 500 MG capsule Take 1 capsule (500 mg total) by mouth 3 (three) times daily. 6 capsule 0   APAP-Pamabrom-Pyrilamine (PAMPRIN MAX PAIN FORMULA PO) Take 2 tablets by mouth every 6 (six) hours as needed (menstrual cramps).     atorvastatin (LIPITOR) 10 MG tablet Take 1 tablet (10 mg total) by mouth daily. 90 tablet 3   celecoxib (CELEBREX) 100 MG capsule SMARTSIG:1 Capsule(s) By Mouth 1-2 Times Daily PRN     Fluocinolone Acetonide Scalp 0.01 % OIL SMARTSIG:Topical 3 Times a Week PRN     hydrocortisone (ANUSOL-HC) 25 MG suppository Place 25 mg rectally 2 (two) times daily.     metFORMIN (GLUCOPHAGE) 500 MG tablet Take 1 tablet (500 mg total) by mouth  once daily with breakfast. 60 tablet 3   minoxidil (LONITEN) 2.5 MG tablet Take 2.5 mg by mouth 2 (two) times daily.     prazosin (MINIPRESS) 2 MG capsule Take 1 capsule (2 mg total) by mouth at bedtime. 30 capsule 3   QUEtiapine (SEROQUEL XR) 200 MG 24 hr tablet Take 1 tablet (200 mg total) by mouth at bedtime. 30 tablet 3   QUEtiapine (SEROQUEL) 50 MG tablet TAKE 1 TABLET(50 MG) BY MOUTH DAILY 30 tablet 3   spironolactone (ALDACTONE) 100 MG tablet Take 1  tablet (100 mg total) by mouth 2 (two) times daily. 390 tablet 2   tiZANidine (ZANAFLEX) 4 MG tablet TAKE 1 TABLET(4 MG) BY MOUTH EVERY 6 HOURS AS NEEDED FOR MUSCLE SPASMS 30 tablet 1   valACYclovir (VALTREX) 1000 MG tablet Take 1 tablet (1,000 mg total) by mouth once daily. 60 tablet 2   zolpidem (AMBIEN) 5 MG tablet Take 1 tablet (5 mg total) by mouth at bedtime as needed for sleep. 15 tablet 2   No current facility-administered medications on file prior to visit.    No Known Allergies  Social History   Socioeconomic History   Marital status: Single    Spouse name: Not on file   Number of children: Not on file   Years of education: Not on file   Highest education level: Not on file  Occupational History   Not on file  Tobacco Use   Smoking status: Former    Current packs/day: 0.00    Average packs/day: 0.3 packs/day for 15.0 years (3.8 ttl pk-yrs)    Types: Cigarettes    Start date: 05/02/2005    Quit date: 05/02/2020    Years since quitting: 2.6   Smokeless tobacco: Never  Vaping Use   Vaping status: Never Used  Substance and Sexual Activity   Alcohol use: No   Drug use: No   Sexual activity: Not Currently    Birth control/protection: Abstinence  Other Topics Concern   Not on file  Social History Narrative   Not on file   Social Drivers of Health   Financial Resource Strain: High Risk (12/19/2022)   Overall Financial Resource Strain (CARDIA)    Difficulty of Paying Living Expenses: Very hard  Food Insecurity: No Food Insecurity (12/19/2022)   Hunger Vital Sign    Worried About Running Out of Food in the Last Year: Never true    Ran Out of Food in the Last Year: Never true  Transportation Needs: No Transportation Needs (12/19/2022)   PRAPARE - Administrator, Civil Service (Medical): No    Lack of Transportation (Non-Medical): No  Physical Activity: Insufficiently Active (12/19/2022)   Exercise Vital Sign    Days of Exercise per Week: 2 days     Minutes of Exercise per Session: 20 min  Stress: No Stress Concern Present (12/19/2022)   [employer] of Occupational Health - Occupational Stress Questionnaire    Feeling of Stress : Not at all  Social Connections: Socially Isolated (12/19/2022)   Social Connection and Isolation Panel [NHANES]    Frequency of Communication with Friends and Family: Once a week    Frequency of Social Gatherings with Friends and Family: Once a week    Attends Religious Services: Never    Database administrator or Organizations: No    Attends Banker Meetings: Never    Marital Status: Never married  Intimate Partner Violence: Not At Risk (12/19/2022)   Humiliation, Afraid,  Rape, and Kick questionnaire    Fear of Current or Ex-Partner: No    Emotionally Abused: No    Physically Abused: No    Sexually Abused: No    Family History  Problem Relation Age of Onset   Bipolar disorder Mother    Diabetes Maternal Grandmother    Hypertension Neg Hx    Heart disease Neg Hx    Cancer Neg Hx     Past Surgical History:  Procedure Laterality Date   BUBBLE STUDY  05/25/2020   Procedure: BUBBLE STUDY;  Surgeon: Chrystie Nose, MD;  Location: [facility] ENDOSCOPY;  Service: Cardiovascular;;   NO PAST SURGERIES     TEE WITHOUT CARDIOVERSION N/A 05/25/2020   Procedure: TRANSESOPHAGEAL ECHOCARDIOGRAM (TEE);  Surgeon: Chrystie Nose, MD;  Location: [facility] ENDOSCOPY;  Service: Cardiovascular;  Laterality: N/A;    ROS: Review of Systems Negative except as stated above  PHYSICAL EXAM: BP 107/75 (BP Location: Left Arm, Patient Position: Sitting, Cuff Size: Normal)   Pulse (!) 105   Temp 98.5 F (36.9 C) (Oral)   Ht 5\' 7"  (1.702 m)   Wt 145 lb (65.8 kg)   LMP 11/21/2022 (Approximate)   SpO2 100%   BMI 22.71 kg/m   Wt Readings from Last 3 Encounters:  12/19/22 145 lb (65.8 kg)  12/08/22 148 lb (67.1 kg)  11/23/22 148 lb (67.1 kg)    Physical Exam  General appearance - alert, well appearing,  middle-age African-American female and in no distress Mental status - normal mood, behavior, speech, dress, motor activity, and thought processes Chest - clear to auscultation, no wheezes, rales or rhonchi, symmetric air entry Heart -mild tachycardia but regular.  normal S1, S2, no murmurs, rubs, clicks or gallops Extremities - peripheral pulses normal, no pedal edema, no clubbing or cyanosis      Latest Ref Rng & Units 08/15/2022    4:12 PM 06/16/2022   12:01 AM 07/17/2021    2:02 PM  CMP  Glucose 70 - 99 mg/dL 82  93  90   BUN 6 - 24 mg/dL 15  8  16    Creatinine 0.57 - 1.00 mg/dL 1.61  0.96  0.45   Sodium 134 - 144 mmol/L 141  136  137   Potassium 3.5 - 5.2 mmol/L 4.0  3.8  5.0   Chloride 96 - 106 mmol/L 105  105  99   CO2 20 - 29 mmol/L 24  22  21    Calcium 8.7 - 10.2 mg/dL 8.8  8.5  9.4   Total Protein 6.0 - 8.5 g/dL 6.3  6.4  7.5   Total Bilirubin 0.0 - 1.2 mg/dL 0.3  0.3  0.6   Alkaline Phos 44 - 121 IU/L 71  57  105   AST 0 - 40 IU/L 33  17  18   ALT 0 - 32 IU/L 74  18  22    Lipid Panel     Component Value Date/Time   CHOL 202 (H) 08/15/2022 1612   TRIG 72 08/15/2022 1612   HDL 62 08/15/2022 1612   CHOLHDL 3.3 08/15/2022 1612   CHOLHDL 2.8 05/24/2020 0332   VLDL 28 05/24/2020 0332   LDLCALC 127 (H) 08/15/2022 1612    CBC    Component Value Date/Time   WBC 5.8 06/16/2022 0001   RBC 4.50 06/16/2022 0001   HGB 12.3 06/16/2022 0001   HGB 13.7 07/17/2021 1402   HCT 39.1 06/16/2022 0001   HCT 41.5 07/17/2021 1402  PLT 346 06/16/2022 0001   PLT 447 07/17/2021 1402   MCV 86.9 06/16/2022 0001   MCV 83 07/17/2021 1402   MCH 27.3 06/16/2022 0001   MCHC 31.5 06/16/2022 0001   RDW 13.7 06/16/2022 0001   RDW 12.8 07/17/2021 1402   LYMPHSABS 1.6 07/17/2021 1402   MONOABS 0.5 05/22/2021 1555   EOSABS 0.1 07/17/2021 1402   BASOSABS 0.1 07/17/2021 1402    ASSESSMENT AND PLAN:  1. Encounter for weight management Patient has done well on Ozempic which she has been on  for a little over a year.  She is at where she wants to be in terms of her weight and does not want to lose any further weight.  We discussed decreasing the dose of Ozempic to 1 mg once a week and see whether she maintains her current weight on the lower dose.  She will continue healthy eating.  I have encouraged her to start exercising some.  She plans to do some water aerobics. - Semaglutide, 1 MG/DOSE, 4 MG/3ML SOPN; Inject 1 mg as directed once a week.  Dispense: 3 mL; Refill: 2  2. Prediabetes (Primary) See #1 above - Semaglutide, 1 MG/DOSE, 4 MG/3ML SOPN; Inject 1 mg as directed once a week.  Dispense: 3 mL; Refill: 2  3. Mixed hyperlipidemia Continue atorvastatin.  Last lipid profile showed mild elevation in ALT.  We will plan to recheck chemistry today. - Comprehensive metabolic panel  4. Tachycardia Patient with mild tachycardia today.  She does not appear dehydrated.  Recent thyroid hormone levels were normal. - Comprehensive metabolic panel   Patient was given the opportunity to ask questions.  Patient verbalized understanding of the plan and was able to repeat key elements of the plan.   This documentation was completed using Paediatric nurse.  Any transcriptional errors are unintentional.  Orders Placed This Encounter  Procedures   Comprehensive metabolic panel     Requested Prescriptions   Signed Prescriptions Disp Refills   Semaglutide, 1 MG/DOSE, 4 MG/3ML SOPN 3 mL 2    Sig: Inject 1 mg as directed once a week.    Return in about 4 months (around 04/19/2023).  Jonah Blue, MD, FACP

## 2022-12-20 ENCOUNTER — Encounter (HOSPITAL_BASED_OUTPATIENT_CLINIC_OR_DEPARTMENT_OTHER): Payer: Medicare PPO | Admitting: Internal Medicine

## 2022-12-20 DIAGNOSIS — L309 Dermatitis, unspecified: Secondary | ICD-10-CM

## 2022-12-20 DIAGNOSIS — R8290 Unspecified abnormal findings in urine: Secondary | ICD-10-CM | POA: Diagnosis not present

## 2022-12-20 LAB — COMPREHENSIVE METABOLIC PANEL
ALT: 17 [IU]/L (ref 0–32)
AST: 14 [IU]/L (ref 0–40)
Albumin: 4.3 g/dL (ref 3.9–4.9)
Alkaline Phosphatase: 81 [IU]/L (ref 44–121)
BUN/Creatinine Ratio: 10 (ref 9–23)
BUN: 9 mg/dL (ref 6–24)
Bilirubin Total: 0.4 mg/dL (ref 0.0–1.2)
CO2: 23 mmol/L (ref 20–29)
Calcium: 9.2 mg/dL (ref 8.7–10.2)
Chloride: 106 mmol/L (ref 96–106)
Creatinine, Ser: 0.88 mg/dL (ref 0.57–1.00)
Globulin, Total: 2.6 g/dL (ref 1.5–4.5)
Glucose: 81 mg/dL (ref 70–99)
Potassium: 4.5 mmol/L (ref 3.5–5.2)
Sodium: 143 mmol/L (ref 134–144)
Total Protein: 6.9 g/dL (ref 6.0–8.5)
eGFR: 85 mL/min/{1.73_m2} (ref >59)

## 2022-12-22 ENCOUNTER — Ambulatory Visit (HOSPITAL_BASED_OUTPATIENT_CLINIC_OR_DEPARTMENT_OTHER): Admission: RE | Admit: 2022-12-22 | Payer: Medicare PPO | Source: Ambulatory Visit

## 2022-12-22 ENCOUNTER — Telehealth: Payer: Self-pay | Admitting: *Deleted

## 2022-12-22 NOTE — Telephone Encounter (Signed)
Call returned to patient. Patient states she is calling in regards to PUS scheduled for today.    Left message to call [facility] Triage at [phone], option 4.

## 2022-12-23 NOTE — Telephone Encounter (Signed)
Per investigation of EMR:  Pt cancelled external PUS for yesterday (12/16) and is now r/s for 12/29/2022

## 2022-12-25 ENCOUNTER — Other Ambulatory Visit: Payer: Self-pay | Admitting: Family Medicine

## 2022-12-29 ENCOUNTER — Ambulatory Visit (HOSPITAL_BASED_OUTPATIENT_CLINIC_OR_DEPARTMENT_OTHER): Payer: Medicare PPO

## 2023-01-01 NOTE — Telephone Encounter (Signed)
Per investigation of EMR: Cancelled appt for PUS on 12/23 and now scheduled for 12/27.  LDVM on machine per DPR advising the pt that if she still had questions re: the PUS, then to cb and let us know.   For now, routing to provider for final review and encounter closed.

## 2023-01-02 ENCOUNTER — Ambulatory Visit (HOSPITAL_BASED_OUTPATIENT_CLINIC_OR_DEPARTMENT_OTHER)
Admission: RE | Admit: 2023-01-02 | Discharge: 2023-01-02 | Disposition: A | Discharge status: Home / Self Care | Payer: Medicare PPO | Source: Ambulatory Visit | Attending: Obstetrics and Gynecology | Admitting: Obstetrics and Gynecology

## 2023-01-02 ENCOUNTER — Other Ambulatory Visit: Payer: Self-pay

## 2023-01-02 DIAGNOSIS — Z8742 Personal history of other diseases of the female genital tract: Secondary | ICD-10-CM

## 2023-01-06 ENCOUNTER — Ambulatory Visit
Admission: RE | Admit: 2023-01-06 | Discharge: 2023-01-06 | Disposition: A | Discharge status: Home / Self Care | Payer: Medicare PPO | Source: Ambulatory Visit | Attending: Obstetrics and Gynecology | Admitting: Obstetrics and Gynecology

## 2023-01-06 DIAGNOSIS — Z1231 Encounter for screening mammogram for malignant neoplasm of breast: Secondary | ICD-10-CM | POA: Diagnosis not present

## 2023-01-08 DIAGNOSIS — K648 Other hemorrhoids: Secondary | ICD-10-CM | POA: Diagnosis not present

## 2023-01-09 ENCOUNTER — Ambulatory Visit (HOSPITAL_BASED_OUTPATIENT_CLINIC_OR_DEPARTMENT_OTHER): Payer: Medicare PPO

## 2023-01-09 ENCOUNTER — Encounter (HOSPITAL_BASED_OUTPATIENT_CLINIC_OR_DEPARTMENT_OTHER): Payer: Self-pay

## 2023-01-12 ENCOUNTER — Other Ambulatory Visit: Payer: Self-pay

## 2023-01-12 ENCOUNTER — Ambulatory Visit (HOSPITAL_BASED_OUTPATIENT_CLINIC_OR_DEPARTMENT_OTHER)
Admission: RE | Admit: 2023-01-12 | Discharge: 2023-01-12 | Disposition: A | Discharge status: Home / Self Care | Payer: Medicare PPO | Source: Ambulatory Visit | Attending: Obstetrics and Gynecology | Admitting: Obstetrics and Gynecology

## 2023-01-12 DIAGNOSIS — N888 Other specified noninflammatory disorders of cervix uteri: Secondary | ICD-10-CM | POA: Diagnosis not present

## 2023-01-12 DIAGNOSIS — Z8742 Personal history of other diseases of the female genital tract: Secondary | ICD-10-CM | POA: Insufficient documentation

## 2023-01-12 MED ORDER — CLOBETASOL PROPIONATE 0.05 % EX CREA
1.0000 | TOPICAL_CREAM | Freq: Two times a day (BID) | CUTANEOUS | 0 refills | Status: DC
  Filled 2023-01-12: qty 30, 30d supply, fill #0

## 2023-01-12 NOTE — Telephone Encounter (Signed)
Please see the MyChart message reply(ies) for my assessment and plan.    This patient gave consent for this Medical Advice Message and is aware that it may result in a bill to their insurance company, as well as the possibility of receiving a bill for a co-payment or deductible. They are an established patient, but are not seeking medical advice exclusively about a problem treated during an in person or video visit in the last seven days. I did not recommend an in person or video visit within seven days of my reply.    I spent a total of 5 minutes cumulative time within 7 days through MyChart messaging.  Mariem Skolnick, MD   

## 2023-01-13 ENCOUNTER — Other Ambulatory Visit: Payer: Self-pay

## 2023-01-14 ENCOUNTER — Encounter: Payer: Self-pay | Admitting: Obstetrics and Gynecology

## 2023-01-16 IMAGING — CT CT HEAD W/O CM
3 series · 15 of 47 positions shown, 18 images · non-contrast
Comparison: 05/27/2020

CLINICAL DATA: Right-sided facial tingling for 1 day

EXAM:
CT HEAD WITHOUT CONTRAST
TECHNIQUE: Contiguous axial images were obtained from the base of the skull
through the vertex without intravenous contrast.

[Series 2: head wo · axial · 0.48mm/px · z∈[-148,-23]mm · 9 of 31 slices shown, 12 images]
[im 3/31  brain]
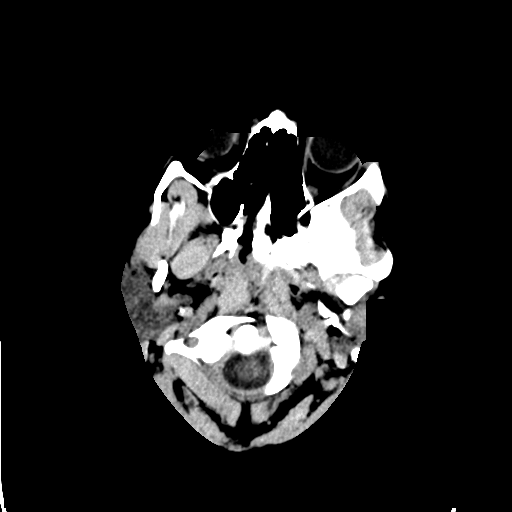
[im 3/31  bone]
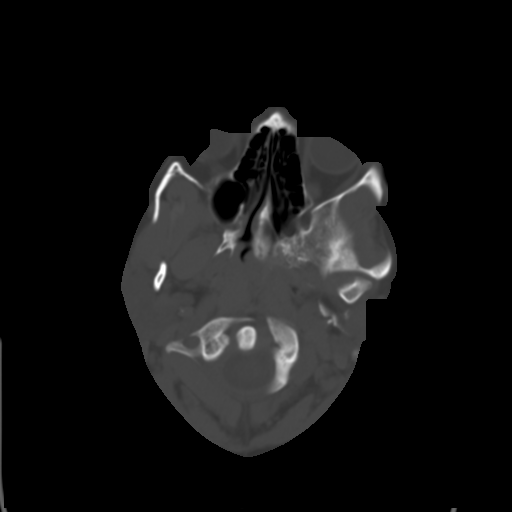
[im 6/31  brain]
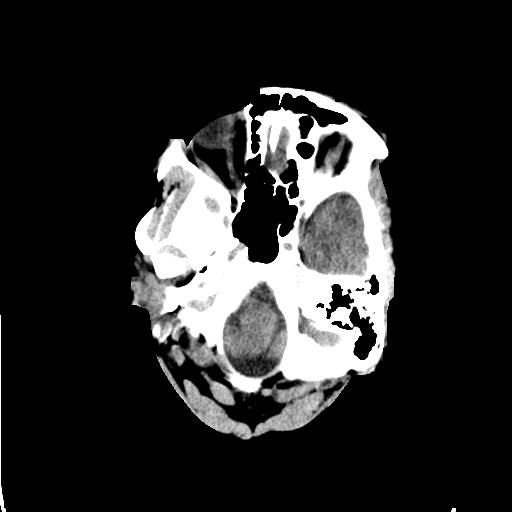
[im 9/31  brain]
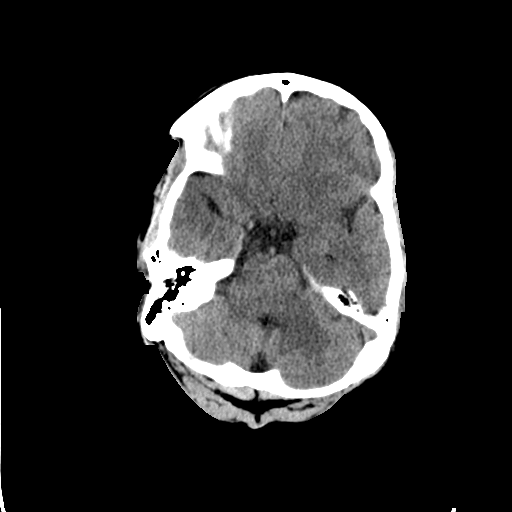
[im 12/31  brain]
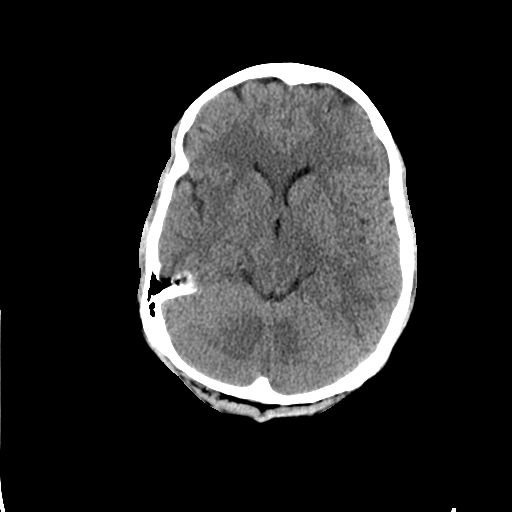
[im 16/31  brain]
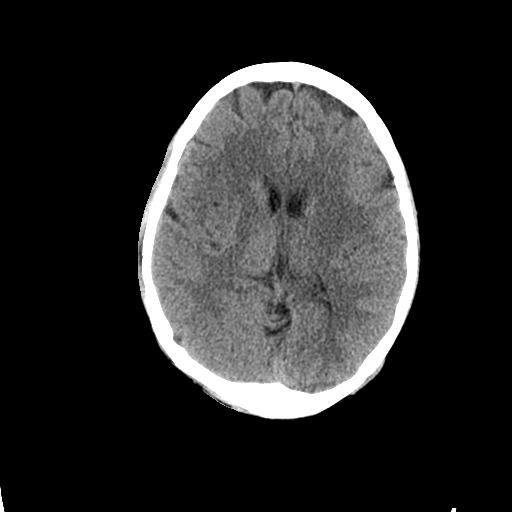
[im 16/31  bone]
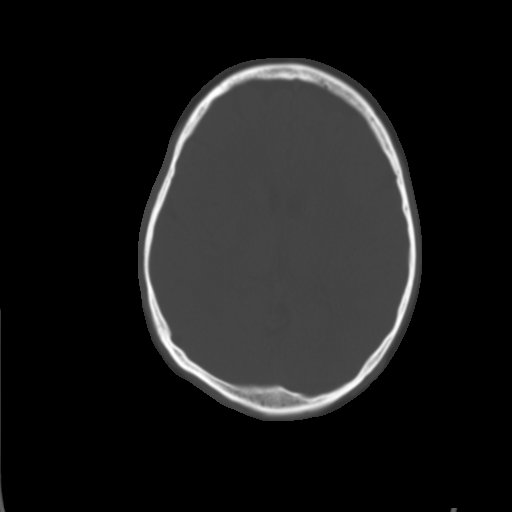
[im 19/31  brain]
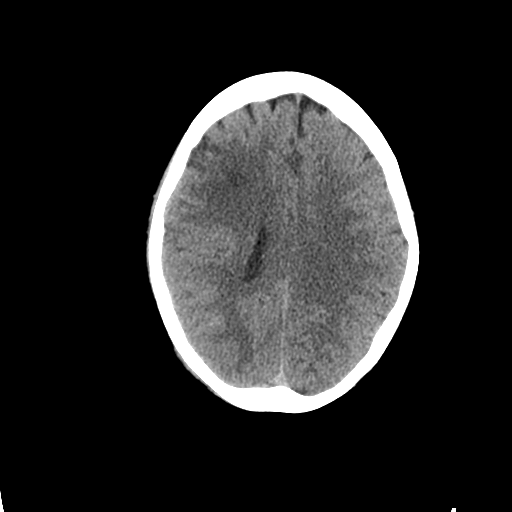
[im 22/31  brain]
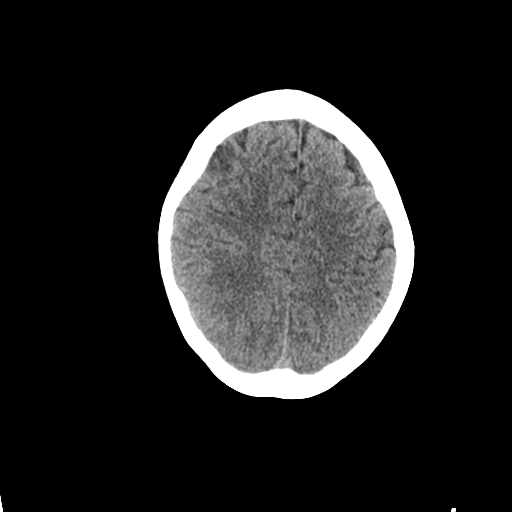
[im 25/31  brain]
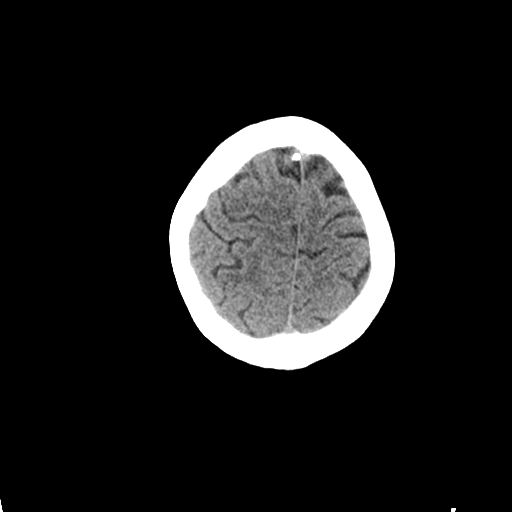
[im 28/31  brain]
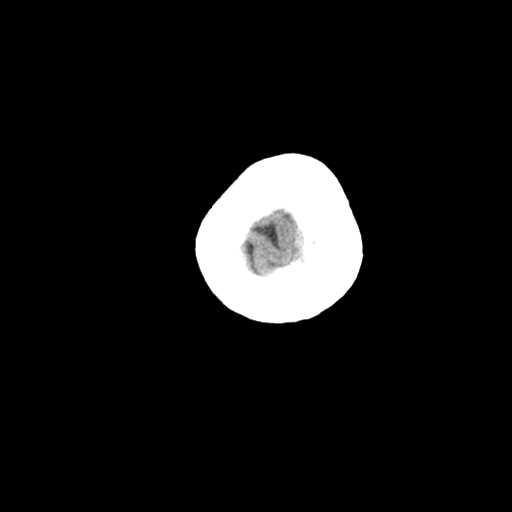
[im 28/31  bone]
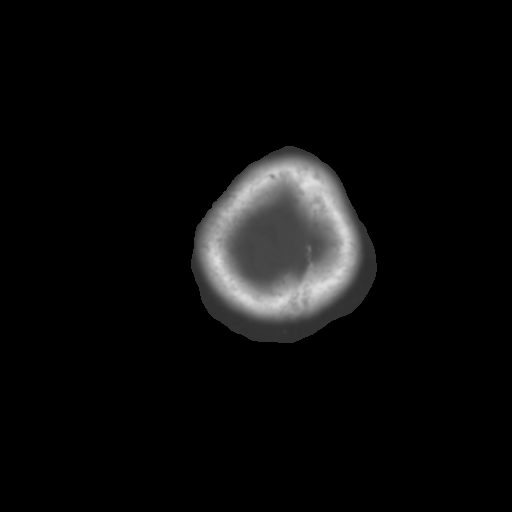

[Series 5: coronal soft tissue · coronal · 0.31mm/px · 3 of 65 slices shown]
[im 22/65  brain]
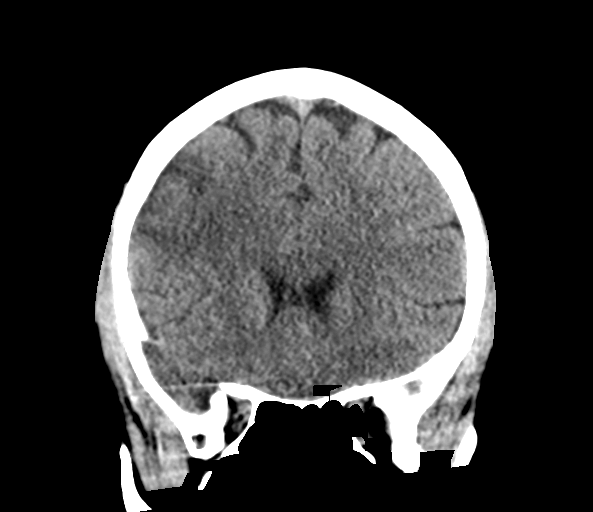
[im 29/65  brain]
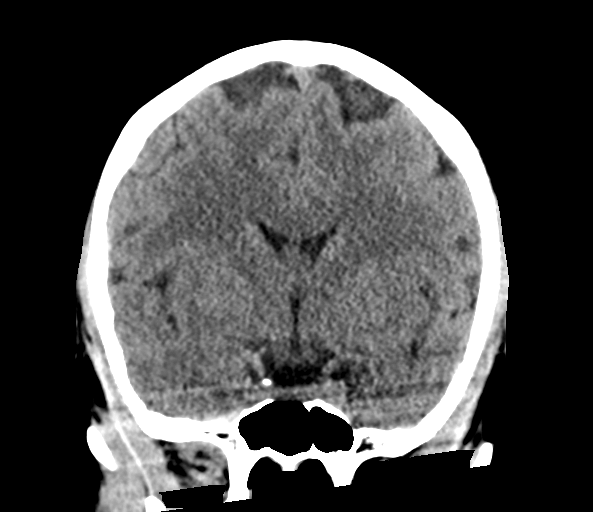
[im 36/65  brain]
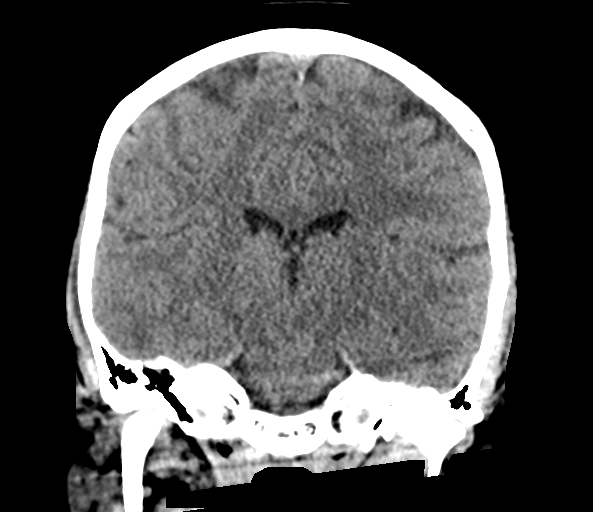

[Series 6: sagittal soft tissue · sagittal · 0.31mm/px · 3 of 54 slices shown]
[im 18/54  brain]
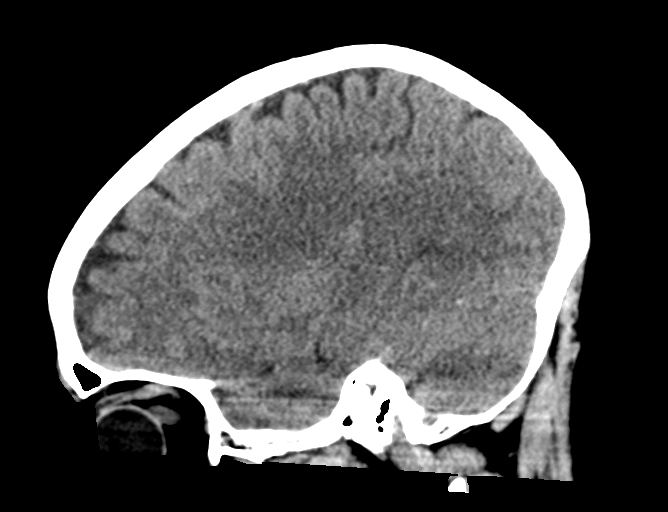
[im 27/54  brain]
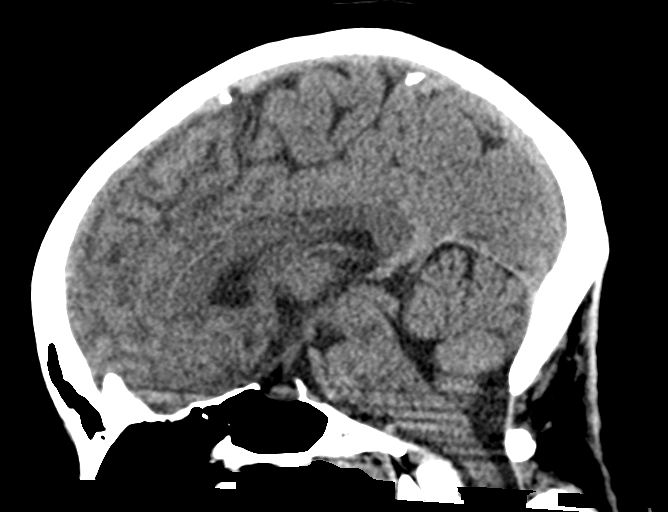
[im 36/54  brain]
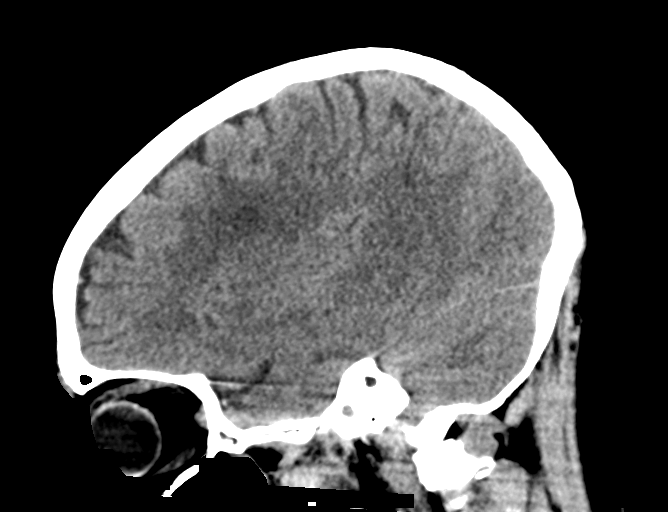

[15 of 47 positions shown; findings below may reference images not displayed]

FINDINGS: Brain: Geographic area of decreased attenuation in the right frontal
lobe posteriorly consistent with the prior right MCA infarct. The
overall appearance is consistent with the expected evolution of the
known infarct. No new infarct is seen. No acute hemorrhage is noted.
No space-occupying mass lesion is seen.

Vascular: No hyperdense vessel or unexpected calcification.

Skull: Normal. Negative for fracture or focal lesion.

Sinuses/Orbits: No acute finding.

Other: None.
IMPRESSION: Continued evolution of right frontal lobe infarct.

No acute abnormality noted.

## 2023-01-19 ENCOUNTER — Other Ambulatory Visit: Payer: Self-pay

## 2023-01-21 ENCOUNTER — Ambulatory Visit: Payer: Medicare PPO | Attending: Family Medicine

## 2023-01-21 ENCOUNTER — Other Ambulatory Visit: Payer: Self-pay

## 2023-01-21 DIAGNOSIS — R8290 Unspecified abnormal findings in urine: Secondary | ICD-10-CM | POA: Diagnosis not present

## 2023-01-22 ENCOUNTER — Other Ambulatory Visit: Payer: Self-pay | Admitting: Internal Medicine

## 2023-01-22 LAB — MICROSCOPIC EXAMINATION
Casts: NONE SEEN /[LPF]
Epithelial Cells (non renal): >10 /[HPF] — AB (ref 0–10)
RBC, Urine: NONE SEEN /[HPF] (ref 0–2)

## 2023-01-22 LAB — URINALYSIS, ROUTINE W REFLEX MICROSCOPIC
Bilirubin, UA: NEGATIVE
Glucose, UA: NEGATIVE
Ketones, UA: NEGATIVE
Nitrite, UA: NEGATIVE
Protein,UA: NEGATIVE
RBC, UA: NEGATIVE
Specific Gravity, UA: 1.016 (ref 1.005–1.030)
Urobilinogen, Ur: 0.2 mg/dL (ref 0.2–1.0)
pH, UA: 6.5 (ref 5.0–7.5)

## 2023-01-22 MED ORDER — SULFAMETHOXAZOLE-TRIMETHOPRIM 800-160 MG PO TABS
1.0000 | ORAL_TABLET | Freq: Two times a day (BID) | ORAL | 0 refills | Status: DC
  Filled 2023-01-22: qty 6, 3d supply, fill #0

## 2023-01-23 ENCOUNTER — Other Ambulatory Visit: Payer: Self-pay

## 2023-01-26 ENCOUNTER — Encounter: Payer: Self-pay | Admitting: Internal Medicine

## 2023-01-28 ENCOUNTER — Other Ambulatory Visit (HOSPITAL_COMMUNITY): Payer: Self-pay | Admitting: Psychiatry

## 2023-01-28 ENCOUNTER — Telehealth (HOSPITAL_COMMUNITY): Payer: Self-pay | Admitting: *Deleted

## 2023-01-28 ENCOUNTER — Other Ambulatory Visit: Payer: Self-pay

## 2023-01-28 DIAGNOSIS — F250 Schizoaffective disorder, bipolar type: Secondary | ICD-10-CM

## 2023-01-28 MED ORDER — QUETIAPINE FUMARATE ER 200 MG PO TB24
200.0000 mg | ORAL_TABLET | Freq: Every day | ORAL | 3 refills | Status: DC
  Filled 2023-01-28: qty 30, 30d supply, fill #0

## 2023-01-28 MED ORDER — QUETIAPINE FUMARATE 50 MG PO TABS
50.0000 mg | ORAL_TABLET | Freq: Every day | ORAL | 3 refills | Status: DC
  Filled 2023-01-28: qty 30, 30d supply, fill #0

## 2023-01-28 NOTE — Telephone Encounter (Signed)
Patient called asking that her Seroquel be written for twice a day dosing instead of once a day. She was under the impression that it was suppose to be twice daily and has been taking it twice but when she called her pharmacy to get it refilled they notified her of the once daily dose.

## 2023-01-29 ENCOUNTER — Other Ambulatory Visit: Payer: Self-pay

## 2023-01-29 ENCOUNTER — Other Ambulatory Visit (HOSPITAL_COMMUNITY): Payer: Self-pay | Admitting: Psychiatry

## 2023-01-29 DIAGNOSIS — F250 Schizoaffective disorder, bipolar type: Secondary | ICD-10-CM

## 2023-01-29 DIAGNOSIS — K648 Other hemorrhoids: Secondary | ICD-10-CM | POA: Diagnosis not present

## 2023-01-29 MED ORDER — QUETIAPINE FUMARATE 50 MG PO TABS
50.0000 mg | ORAL_TABLET | Freq: Two times a day (BID) | ORAL | 3 refills | Status: DC
  Filled 2023-01-29 – 2023-02-05 (×2): qty 60, 30d supply, fill #0

## 2023-01-29 NOTE — Telephone Encounter (Signed)
Patient informed Clinical research associate that she would like to try Seroquel 50 mg twice a day to help manage her symptoms of psychosis.  Provider was agreeable to this and sent Seroquel 50 mg twice daily to her preferred pharmacy.  She will continue to take Seroquel 200 mg nightly.

## 2023-01-30 DIAGNOSIS — L2089 Other atopic dermatitis: Secondary | ICD-10-CM | POA: Diagnosis not present

## 2023-01-30 DIAGNOSIS — L658 Other specified nonscarring hair loss: Secondary | ICD-10-CM | POA: Diagnosis not present

## 2023-01-30 DIAGNOSIS — E282 Polycystic ovarian syndrome: Secondary | ICD-10-CM | POA: Diagnosis not present

## 2023-01-30 DIAGNOSIS — L680 Hirsutism: Secondary | ICD-10-CM | POA: Diagnosis not present

## 2023-01-30 DIAGNOSIS — L089 Local infection of the skin and subcutaneous tissue, unspecified: Secondary | ICD-10-CM | POA: Diagnosis not present

## 2023-02-04 ENCOUNTER — Telehealth (HOSPITAL_COMMUNITY): Payer: Medicare PPO | Admitting: Psychiatry

## 2023-02-05 ENCOUNTER — Other Ambulatory Visit: Payer: Self-pay

## 2023-02-09 ENCOUNTER — Encounter (HOSPITAL_COMMUNITY): Payer: Self-pay | Admitting: Psychiatry

## 2023-02-09 ENCOUNTER — Telehealth (INDEPENDENT_AMBULATORY_CARE_PROVIDER_SITE_OTHER): Payer: Medicare PPO | Admitting: Psychiatry

## 2023-02-09 ENCOUNTER — Other Ambulatory Visit: Payer: Self-pay

## 2023-02-09 ENCOUNTER — Other Ambulatory Visit: Payer: Self-pay | Admitting: Internal Medicine

## 2023-02-09 DIAGNOSIS — F5105 Insomnia due to other mental disorder: Secondary | ICD-10-CM

## 2023-02-09 DIAGNOSIS — F4310 Post-traumatic stress disorder, unspecified: Secondary | ICD-10-CM

## 2023-02-09 DIAGNOSIS — F250 Schizoaffective disorder, bipolar type: Secondary | ICD-10-CM | POA: Diagnosis not present

## 2023-02-09 DIAGNOSIS — Z7689 Persons encountering health services in other specified circumstances: Secondary | ICD-10-CM

## 2023-02-09 DIAGNOSIS — R7303 Prediabetes: Secondary | ICD-10-CM

## 2023-02-09 DIAGNOSIS — F99 Mental disorder, not otherwise specified: Secondary | ICD-10-CM

## 2023-02-09 MED ORDER — PRAZOSIN HCL 2 MG PO CAPS
2.0000 mg | ORAL_CAPSULE | Freq: Every day | ORAL | 3 refills | Status: DC
  Filled 2023-02-09: qty 30, 30d supply, fill #0

## 2023-02-09 MED ORDER — ZOLPIDEM TARTRATE 5 MG PO TABS
5.0000 mg | ORAL_TABLET | Freq: Every evening | ORAL | 1 refills | Status: DC | PRN
  Filled 2023-02-09: qty 15, 15d supply, fill #0

## 2023-02-09 MED ORDER — QUETIAPINE FUMARATE 50 MG PO TABS: 50.0000 mg | ORAL_TABLET | Freq: Two times a day (BID) | ORAL | 3 refills | Status: DC

## 2023-02-09 MED ORDER — QUETIAPINE FUMARATE ER 300 MG PO TB24
300.0000 mg | ORAL_TABLET | Freq: Every day | ORAL | 3 refills | Status: DC
  Filled 2023-02-09: qty 30, 30d supply, fill #0
  Filled 2023-03-16: qty 30, 30d supply, fill #1
  Filled 2023-05-04: qty 30, 30d supply, fill #2
  Filled 2023-06-01: qty 30, 30d supply, fill #3

## 2023-02-09 MED ORDER — SEMAGLUTIDE (2 MG/DOSE) 8 MG/3ML ~~LOC~~ SOPN
2.0000 mg | PEN_INJECTOR | SUBCUTANEOUS | 2 refills | Status: DC
  Filled 2023-02-09: qty 3, 28d supply, fill #0
  Filled 2023-03-05: qty 3, 28d supply, fill #1
  Filled 2023-04-01: qty 3, 28d supply, fill #2
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
## 2023-02-10 ENCOUNTER — Other Ambulatory Visit (HOSPITAL_COMMUNITY): Payer: Self-pay | Admitting: Psychiatry

## 2023-02-10 DIAGNOSIS — F250 Schizoaffective disorder, bipolar type: Secondary | ICD-10-CM

## 2023-02-10 LAB — COMPREHENSIVE METABOLIC PANEL
ALT: 50 [IU]/L — ABNORMAL HIGH (ref 0–32)
AST: 24 [IU]/L (ref 0–40)
Albumin: 4.3 g/dL (ref 3.9–4.9)
Alkaline Phosphatase: 82 [IU]/L (ref 44–121)
BUN/Creatinine Ratio: 7 — ABNORMAL LOW (ref 9–23)
BUN: 7 mg/dL (ref 6–24)
Bilirubin Total: 0.4 mg/dL (ref 0.0–1.2)
CO2: 22 mmol/L (ref 20–29)
Calcium: 9.2 mg/dL (ref 8.7–10.2)
Chloride: 107 mmol/L — ABNORMAL HIGH (ref 96–106)
Creatinine, Ser: 0.97 mg/dL (ref 0.57–1.00)
Globulin, Total: 2.4 g/dL (ref 1.5–4.5)
Glucose: 78 mg/dL (ref 70–99)
Potassium: 4.4 mmol/L (ref 3.5–5.2)
Sodium: 143 mmol/L (ref 134–144)
Total Protein: 6.7 g/dL (ref 6.0–8.5)
eGFR: 76 mL/min/{1.73_m2} (ref >59)

## 2023-02-10 LAB — URINE DRUG PANEL 7
Amphetamines, Urine: NEGATIVE ng/mL
Barbiturate Quant, Ur: NEGATIVE ng/mL
Benzodiazepine Quant, Ur: NEGATIVE ng/mL
Cannabinoid Quant, Ur: NEGATIVE ng/mL
Cocaine (Metab.): NEGATIVE ng/mL
Opiate Quant, Ur: NEGATIVE ng/mL
PCP Quant, Ur: NEGATIVE ng/mL

## 2023-02-10 LAB — HEMOGLOBIN A1C
Est. average glucose Bld gHb Est-mCnc: 108 mg/dL
Hgb A1c MFr Bld: 5.4 % (ref 4.8–5.6)

## 2023-02-10 LAB — CBC WITH DIFFERENTIAL/PLATELET
Basophils Absolute: 0.1 10*3/uL (ref 0.0–0.2)
Basos: 1 %
EOS (ABSOLUTE): 0.1 10*3/uL (ref 0.0–0.4)
Eos: 3 %
Hematocrit: 39.6 % (ref 34.0–46.6)
Hemoglobin: 12.3 g/dL (ref 11.1–15.9)
Immature Grans (Abs): 0 10*3/uL (ref 0.0–0.1)
Immature Granulocytes: 1 %
Lymphocytes Absolute: 1.8 10*3/uL (ref 0.7–3.1)
Lymphs: 41 %
MCH: 28.4 pg (ref 26.6–33.0)
MCHC: 31.1 g/dL — ABNORMAL LOW (ref 31.5–35.7)
MCV: 92 fL (ref 79–97)
Monocytes Absolute: 0.4 10*3/uL (ref 0.1–0.9)
Monocytes: 10 %
Neutrophils Absolute: 1.9 10*3/uL (ref 1.4–7.0)
Neutrophils: 44 %
Platelets: 379 10*3/uL (ref 150–450)
RBC: 4.33 x10E6/uL (ref 3.77–5.28)
RDW: 13.1 % (ref 11.7–15.4)
WBC: 4.4 10*3/uL (ref 3.4–10.8)

## 2023-02-10 LAB — LIPID PANEL
Chol/HDL Ratio: 2.6 {ratio} (ref 0.0–4.4)
Cholesterol, Total: 188 mg/dL (ref 100–199)
HDL: 71 mg/dL (ref >39)
LDL Chol Calc (NIH): 106 mg/dL — ABNORMAL HIGH (ref 0–99)
Triglycerides: 61 mg/dL (ref 0–149)
VLDL Cholesterol Cal: 11 mg/dL (ref 5–40)

## 2023-02-10 LAB — HEPATIC FUNCTION PANEL
ALT: 47 [IU]/L — ABNORMAL HIGH (ref 0–32)
AST: 21 [IU]/L (ref 0–40)
Albumin: 4 g/dL (ref 3.9–4.9)
Alkaline Phosphatase: 79 [IU]/L (ref 44–121)
Bilirubin Total: 0.4 mg/dL (ref 0.0–1.2)
Bilirubin, Direct: 0.13 mg/dL (ref 0.00–0.40)
Total Protein: 6.4 g/dL (ref 6.0–8.5)

## 2023-02-10 LAB — THYROID PANEL WITH TSH
Free Thyroxine Index: 1.4 (ref 1.2–4.9)
T3 Uptake Ratio: 25 % (ref 24–39)
T4, Total: 5.6 ug/dL (ref 4.5–12.0)
TSH: 2.63 u[IU]/mL (ref 0.450–4.500)

## 2023-02-10 LAB — PROLACTIN: Prolactin: 25.5 ng/mL (ref 4.8–33.4)

## 2023-02-10 NOTE — Progress Notes (Signed)
Provider discussed lab results with patient. Patient notes that she will discuss abnormal lab values with her PCP.  No other concerns noted at this time.

## 2023-02-12 NOTE — Telephone Encounter (Signed)
 Copied from CRM [id number]. Topic: Clinical - Medical Advice >> Feb 10, 2023  1:14 PM Alfonso ORN wrote: Reason for CRM: Patient psychiatrist  order labs for patient and her liver numbers are elevated , patient need advice from Barnie Louder if need to make adjustment with medication or some thing else  Call back # [phone]  Patient schedule VV to discuss

## 2023-02-12 NOTE — Telephone Encounter (Signed)
 Communication Reason for CRM: Patient psychiatrist  order labs for patient and her liver numbers are elevated , patient need advice from Concetta Dee if need to make adjustment with medication or some thing else  Call back # [phone]

## 2023-02-16 ENCOUNTER — Other Ambulatory Visit: Payer: Self-pay

## 2023-02-17 ENCOUNTER — Telehealth (HOSPITAL_BASED_OUTPATIENT_CLINIC_OR_DEPARTMENT_OTHER): Payer: Medicare PPO | Admitting: Internal Medicine

## 2023-02-17 DIAGNOSIS — R7989 Other specified abnormal findings of blood chemistry: Secondary | ICD-10-CM

## 2023-02-17 DIAGNOSIS — N926 Irregular menstruation, unspecified: Secondary | ICD-10-CM

## 2023-02-17 NOTE — Progress Notes (Unsigned)
Virtual Visit via Video Note  I connected with Sarah Evans on 02/17/2023 at 1:33 PM by a video enabled telemedicine application and verified that I am speaking with the correct person using two identifiers.  Location: Patient: home Provider: Office   I discussed the limitations of evaluation and management by telemedicine and the availability of in person appointments. The patient expressed understanding and agreed to proceed.  History of Present Illness: Patient with history of prediabetes on Ozempic, HL, PCOS, GERD, obesity, chronic thoracic spine/neck pain BL hearing loss, OSA, schizoaffective bipolar type/PTSD/MDD/GAD   Discussed the use of AI scribe software for clinical note transcription with the patient, who gave verbal consent to proceed.  History of Present Illness   The patient, with a history of polycystic ovarian syndrome (PCOS), presents after a recent psychiatric visit where she was found to have mild elevated in ALT of 47; nl range 0-32. She reports that her psychiatrist increased her Seroquel extended release dosage to 300mg  from 250mg . When asked about whether she is taking the atorvastatin, she stated she has not taken for the past two weeks due to stomach pains from taking multiple oral medications. She denies alcohol use and drug use and follows a vegetarian diet. Recent screen for hep C and B was negative.  She also reports changes in her menstrual cycle when Ozempic dose was decreased to 1mg . She had an extended cycle of seven days when usually menses would last 3 days. The following mth menses was only spotting. No concern for pregnancy.  Reports a recent follow-up on her PCOS with a provider and was told she was doing well with a high ovarian reserve of about 13% and normal hormone levels.      Outpatient Encounter Medications as of 02/17/2023  Medication Sig Note   acetaminophen (TYLENOL) 500 MG tablet Take 1,000 mg by mouth every 6 (six) hours as needed for moderate  pain.    amoxicillin (AMOXIL) 500 MG capsule Take 1 capsule (500 mg total) by mouth 3 (three) times daily.    APAP-Pamabrom-Pyrilamine (PAMPRIN MAX PAIN FORMULA PO) Take 2 tablets by mouth every 6 (six) hours as needed (menstrual cramps).    atorvastatin (LIPITOR) 10 MG tablet Take 1 tablet (10 mg total) by mouth daily.    celecoxib (CELEBREX) 100 MG capsule SMARTSIG:1 Capsule(s) By Mouth 1-2 Times Daily PRN    clobetasol cream (TEMOVATE) 0.05 % Apply 1 Application topically 2 (two) times daily.    Fluocinolone Acetonide Scalp 0.01 % OIL SMARTSIG:Topical 3 Times a Week PRN    hydrocortisone (ANUSOL-HC) 25 MG suppository Place 25 mg rectally 2 (two) times daily.    metFORMIN (GLUCOPHAGE) 500 MG tablet Take 1 tablet (500 mg total) by mouth once daily with breakfast.    minoxidil (LONITEN) 2.5 MG tablet Take 2.5 mg by mouth 2 (two) times daily.    prazosin (MINIPRESS) 2 MG capsule Take 1 capsule (2 mg total) by mouth at bedtime.    QUEtiapine (SEROQUEL XR) 300 MG 24 hr tablet Take 1 tablet (300 mg total) by mouth at bedtime.    QUEtiapine (SEROQUEL) 50 MG tablet Take 1 tablet (50 mg total) by mouth 2 (two) times daily.    Semaglutide, 2 MG/DOSE, 8 MG/3ML SOPN Inject 2 mg as directed once a week.    spironolactone (ALDACTONE) 100 MG tablet Take 1 tablet (100 mg total) by mouth 2 (two) times daily.    sulfamethoxazole-trimethoprim (BACTRIM DS) 800-160 MG tablet Take 1 tablet by mouth 2 (  two) times daily.    tiZANidine (ZANAFLEX) 4 MG tablet TAKE 1 TABLET(4 MG) BY MOUTH EVERY 6 HOURS AS NEEDED FOR MUSCLE SPASMS    valACYclovir (VALTREX) 1000 MG tablet Take 1 tablet (1,000 mg total) by mouth once daily. 09/23/2022: PATIENT TAKES AS NEEDED   zolpidem (AMBIEN) 5 MG tablet Take 1 tablet (5 mg total) by mouth at bedtime as needed for sleep.    No facility-administered encounter medications on file as of 02/17/2023.      Observations/Objective: Middle-age female who appears in NAD Lab Results  Component  Value Date   WBC 4.4 02/09/2023   HGB 12.3 02/09/2023   HCT 39.6 02/09/2023   MCV 92 02/09/2023   PLT 379 02/09/2023  '   Chemistry      Component Value Date/Time   NA 143 02/09/2023 1622   K 4.4 02/09/2023 1622   CL 107 (H) 02/09/2023 1622   CO2 22 02/09/2023 1622   BUN 7 02/09/2023 1622   CREATININE 0.97 02/09/2023 1622      Component Value Date/Time   CALCIUM 9.2 02/09/2023 1622   ALKPHOS 79 02/09/2023 1623   AST 21 02/09/2023 1623   ALT 47 (H) 02/09/2023 1623   BILITOT 0.4 02/09/2023 1623      Assessment and Plan: .1. Elevated liver function tests (Primary) This is mild elevation of ALT which I think we can observe for now and plan to repeat on her next visit.  Advised that atorvastatin can cause elevation in LFTs which if it is mild, we observe.  If it becomes 3x normal, we discontinue the med.  Patient is inquiring whether she should restart the atorvastatin.  I told that she should and we will recheck liver function tests on next visit  2. Variable menstrual cycle Advised to keep a calendar of next several menstrual cycles. Cycles can vary in PCOS.  Follow Up Instructions:  as previously scheduled. I discussed the assessment and treatment plan with the patient. The patient was provided an opportunity to ask questions and all were answered. The patient agreed with the plan and demonstrated an understanding of the instructions.   The patient was advised to call back or seek an in-person evaluation if the symptoms worsen or if the condition fails to improve as anticipated.  I spent 14 minutes dedicated to the care of this patient on the date of this encounter to include previsit review of of chart, face-to-face time with patient discussing diagnosis and management  This note has been created with Education officer, environmental. Any transcriptional errors are unintentional.  Jonah Blue, MD

## 2023-02-18 ENCOUNTER — Encounter: Payer: Self-pay | Admitting: Internal Medicine

## 2023-02-24 ENCOUNTER — Ambulatory Visit (INDEPENDENT_AMBULATORY_CARE_PROVIDER_SITE_OTHER): Payer: Medicare PPO | Admitting: Orthopaedic Surgery

## 2023-02-24 ENCOUNTER — Encounter: Payer: Self-pay | Admitting: Orthopaedic Surgery

## 2023-02-24 DIAGNOSIS — M1711 Unilateral primary osteoarthritis, right knee: Secondary | ICD-10-CM

## 2023-02-24 DIAGNOSIS — M170 Bilateral primary osteoarthritis of knee: Secondary | ICD-10-CM

## 2023-02-24 DIAGNOSIS — M1712 Unilateral primary osteoarthritis, left knee: Secondary | ICD-10-CM

## 2023-02-24 MED ORDER — LIDOCAINE HCL 1 % IJ SOLN
2.0000 mL | INTRAMUSCULAR | Status: AC | PRN
  Administered 2023-02-24: 2 mL

## 2023-02-24 MED ORDER — BUPIVACAINE HCL 0.5 % IJ SOLN
2.0000 mL | INTRAMUSCULAR | Status: AC | PRN
  Administered 2023-02-24: 2 mL via INTRA_ARTICULAR

## 2023-02-24 MED ORDER — METHYLPREDNISOLONE ACETATE 40 MG/ML IJ SUSP
40.0000 mg | INTRAMUSCULAR | Status: AC | PRN
  Administered 2023-02-24: 40 mg via INTRA_ARTICULAR

## 2023-02-24 NOTE — Progress Notes (Signed)
Office Visit Note   Patient: Sarah Evans           Date of Birth: 1980-07-13           MRN: [id number] Visit Date: 02/24/2023              Requested by: Marcine Matar, MD [address] [place],  [place] [address] PCP: Marcine Matar, MD   Assessment & Plan: Visit Diagnoses:  1. Primary osteoarthritis of right knee   2. Primary osteoarthritis of left knee     Plan: Sarah Evans is a 43 year old female with recurrent bilateral knee pain.  Prior injection helped for about 6 months.  We repeated these today.  If her symptoms return we probably need to get an MRI of both knees to rule out structural abnormalities.  She asked me to refill the Celebrex which I agreed to do once since this was originally prescribed by her rheumatologist.  She is not sure of the dose or the frequency and she will get me this information.  Follow-Up Instructions: No follow-ups on file.   Orders:  Orders Placed This Encounter  Procedures   Large Joint Inj   No orders of the defined types were placed in this encounter.     Procedures: Large Joint Inj: bilateral knee on 02/24/2023 10:38 AM Indications: pain Details: 22 G needle  Arthrogram: No  Medications (Right): 2 mL lidocaine 1 %; 2 mL bupivacaine 0.5 %; 40 mg methylPREDNISolone acetate 40 MG/ML Medications (Left): 2 mL lidocaine 1 %; 2 mL bupivacaine 0.5 %; 40 mg methylPREDNISolone acetate 40 MG/ML Outcome: tolerated well, no immediate complications Patient was prepped and draped in the usual sterile fashion.       Clinical Data: No additional findings.   Subjective: Chief Complaint  Patient presents with   Right Knee - Pain   Left Knee - Pain    HPI Patient returns today for follow-up evaluation of bilateral knee pain.  She was last seen about 6 months ago.  We did cortisone injections in both knees.  She feels the injections were very helpful until recently.  She also saw a rheumatologist who prescribed Celebrex.   She is asking for refill today. Review of Systems  Constitutional: Negative.   HENT: Negative.    Eyes: Negative.   Respiratory: Negative.    Cardiovascular: Negative.   Endocrine: Negative.   Musculoskeletal: Negative.   Neurological: Negative.   Hematological: Negative.   Psychiatric/Behavioral: Negative.    All other systems reviewed and are negative.    Objective: Vital Signs: There were no vitals taken for this visit.  Physical Exam Vitals and nursing note reviewed.  Constitutional:      Appearance: She is well-developed.  HENT:     Head: Normocephalic and atraumatic.  Pulmonary:     Effort: Pulmonary effort is normal.  Abdominal:     Palpations: Abdomen is soft.  Musculoskeletal:     Cervical back: Neck supple.  Skin:    General: Skin is warm.     Capillary Refill: Capillary refill takes less than 2 seconds.  Neurological:     Mental Status: She is alert and oriented to person, place, and time.  Psychiatric:        Behavior: Behavior normal.        Thought Content: Thought content normal.        Judgment: Judgment normal.     Ortho Exam Examinations of bilateral knees are unchanged from prior  visit. Specialty Comments:  CLINICAL DATA:  Mid-back pain, spondyloarthropathy suspected, xray done; Low back pain, symptoms persist with > 6 wks treatment.   EXAM: MRI THORACIC AND LUMBAR SPINE WITHOUT CONTRAST   TECHNIQUE: Multiplanar and multiecho pulse sequences of the thoracic and lumbar spine were obtained without intravenous contrast.   COMPARISON:  Cervical and lumbar spine radiographs 05/30/2022.   FINDINGS: MRI THORACIC SPINE FINDINGS   Alignment:  Normal.   Vertebrae: No fracture, evidence of discitis, or bone lesion.   Cord:  Normal spinal cord signal and volume.   Paraspinal and other soft tissues: Cholelithiasis. Metal artifact from metallic foreign body in the right upper lung.   Disc levels:   No disc herniation, spinal canal stenosis  or neural foraminal narrowing.   MRI LUMBAR SPINE FINDINGS   Segmentation:  Standard.   Alignment:  Normal.   Vertebrae:  No fracture, evidence of discitis, or bone lesion.   Conus medullaris and cauda equina: Conus extends to the L1 level. Conus and cauda equina appear normal.   Paraspinal and other soft tissues: Unremarkable.   Disc levels:   T12-L1:  Normal.   L1-L2:  Normal.   L2-L3:  Normal.   L3-L4:  Normal.   L4-L5: Small disc bulge and mild bilateral facet arthropathy. No spinal canal stenosis or neural foraminal narrowing.   L5-S1: No disc herniation, spinal canal stenosis or neural foraminal narrowing. Mild bilateral facet arthropathy.   IMPRESSION: 1. No spinal canal stenosis or neural foraminal narrowing in the thoracic or lumbar spine. 2. Mild bilateral facet arthropathy at L4-L5 and L5-S1. 3. Cholelithiasis.     Electronically Signed   By: Orvan Falconer M.D.   On: 11/10/2022 13:06  Imaging: No results found.   PMFS History: Patient Active Problem List   Diagnosis Date Noted   Well woman exam with routine gynecological exam 11/18/2022   Vision changes 08/16/2022   Bilateral hearing loss 08/16/2022   Chronic pain of both knees 07/24/2021   Sleep apnea 06/20/2021   Cholelithiasis 06/20/2021   History of PCOS 05/30/2021   Hirsutism 05/30/2021   Chronic neck pain 11/01/2020   Chronic thoracic spine pain 10/25/2020   GERD without esophagitis 05/27/2020   Numbness and tingling 05/24/2020   History of stroke 05/23/2020   BMI 29.0-29.9,adult 05/21/2020   Herpes simplex vulvovaginitis 05/21/2020   Prediabetes 04/12/2020   Intrinsic eczema 04/12/2020   Mixed hyperlipidemia 04/12/2020   Muscle spasticity 04/12/2020   Moderate episode of recurrent major depressive disorder (HCC) 06/09/2019   Generalized anxiety disorder 06/09/2019   Personal history of nonsuicidal self-harm 04/28/2018   PTSD (post-traumatic stress disorder) 04/30/2015    Schizoaffective disorder, bipolar type (HCC) 04/27/2015   Past Medical History:  Diagnosis Date   Bipolar 1 disorder (HCC)    Gallstone    Herpes simplex virus (HSV) infection    dx 2001   Polycystic ovary disease    PTSD (post-traumatic stress disorder)    Schizophrenia (HCC)    Stroke (HCC)     Family History  Problem Relation Age of Onset   Bipolar disorder Mother    Diabetes Maternal Grandmother    Hypertension Neg Hx    Heart disease Neg Hx    Cancer Neg Hx     Past Surgical History:  Procedure Laterality Date   BUBBLE STUDY  05/25/2020   Procedure: BUBBLE STUDY;  Surgeon: Chrystie Nose, MD;  Location: [facility] ENDOSCOPY;  Service: Cardiovascular;;   NO PAST SURGERIES  TEE WITHOUT CARDIOVERSION N/A 05/25/2020   Procedure: TRANSESOPHAGEAL ECHOCARDIOGRAM (TEE);  Surgeon: Chrystie Nose, MD;  Location: [facility] ENDOSCOPY;  Service: Cardiovascular;  Laterality: N/A;   Social History   Occupational History   Not on file  Tobacco Use   Smoking status: Former    Current packs/day: 0.00    Average packs/day: 0.3 packs/day for 15.0 years (3.8 ttl pk-yrs)    Types: Cigarettes    Start date: 05/02/2005    Quit date: 05/02/2020    Years since quitting: 2.8   Smokeless tobacco: Never  Vaping Use   Vaping status: Never Used  Substance and Sexual Activity   Alcohol use: No   Drug use: No   Sexual activity: Not Currently    Birth control/protection: Abstinence

## 2023-03-05 ENCOUNTER — Other Ambulatory Visit: Payer: Self-pay

## 2023-03-07 ENCOUNTER — Encounter (HOSPITAL_COMMUNITY): Payer: Self-pay

## 2023-03-07 ENCOUNTER — Emergency Department (HOSPITAL_COMMUNITY)
Admission: EM | Admit: 2023-03-07 | Discharge: 2023-03-08 | Disposition: A | Discharge status: Home / Self Care | Attending: Emergency Medicine | Admitting: Emergency Medicine

## 2023-03-07 ENCOUNTER — Emergency Department (HOSPITAL_COMMUNITY)

## 2023-03-07 ENCOUNTER — Other Ambulatory Visit: Payer: Self-pay

## 2023-03-07 DIAGNOSIS — R0789 Other chest pain: Secondary | ICD-10-CM | POA: Diagnosis not present

## 2023-03-07 DIAGNOSIS — E876 Hypokalemia: Secondary | ICD-10-CM | POA: Diagnosis not present

## 2023-03-07 DIAGNOSIS — R079 Chest pain, unspecified: Secondary | ICD-10-CM | POA: Diagnosis not present

## 2023-03-07 DIAGNOSIS — R0602 Shortness of breath: Secondary | ICD-10-CM | POA: Diagnosis not present

## 2023-03-07 DIAGNOSIS — Z8673 Personal history of transient ischemic attack (TIA), and cerebral infarction without residual deficits: Secondary | ICD-10-CM | POA: Insufficient documentation

## 2023-03-07 LAB — BASIC METABOLIC PANEL
Anion gap: 7 (ref 5–15)
BUN: 12 mg/dL (ref 6–20)
CO2: 27 mmol/L (ref 22–32)
Calcium: 9 mg/dL (ref 8.9–10.3)
Chloride: 105 mmol/L (ref 98–111)
Creatinine, Ser: 0.78 mg/dL (ref 0.44–1.00)
GFR, Estimated: >60 mL/min (ref >60)
Glucose, Bld: 79 mg/dL (ref 70–99)
Potassium: 3.3 mmol/L — ABNORMAL LOW (ref 3.5–5.1)
Sodium: 139 mmol/L (ref 135–145)

## 2023-03-07 LAB — CBC
HCT: 38.3 % (ref 36.0–46.0)
Hemoglobin: 12.1 g/dL (ref 12.0–15.0)
MCH: 28.9 pg (ref 26.0–34.0)
MCHC: 31.6 g/dL (ref 30.0–36.0)
MCV: 91.4 fL (ref 80.0–100.0)
Platelets: 336 10*3/uL (ref 150–400)
RBC: 4.19 MIL/uL (ref 3.87–5.11)
RDW: 13.2 % (ref 11.5–15.5)
WBC: 6.4 10*3/uL (ref 4.0–10.5)
nRBC: 0 % (ref 0.0–0.2)

## 2023-03-07 LAB — HCG, SERUM, QUALITATIVE: Preg, Serum: NEGATIVE

## 2023-03-07 LAB — TROPONIN I (HIGH SENSITIVITY): Troponin I (High Sensitivity): <2 ng/L (ref <18)

## 2023-03-07 MED ORDER — MORPHINE SULFATE (PF) 4 MG/ML IV SOLN
4.0000 mg | Freq: Once | INTRAVENOUS | Status: AC
  Administered 2023-03-07: 4 mg via INTRAVENOUS
  Filled 2023-03-07: qty 1

## 2023-03-07 NOTE — ED Triage Notes (Signed)
 Pt reports "chest pain behind my heart" and intermittent SHOB x30 minutes.

## 2023-03-07 NOTE — ED Provider Notes (Signed)
 [place] EMERGENCY DEPARTMENT AT [facility] Provider Note   CSN: [id number] Arrival date & time: 03/07/23  2057     History {Add pertinent medical, surgical, social history, OB history to HPI:1} Chief Complaint  Patient presents with   Chest Pain   Shortness of Breath    Sarah Evans is a 43 y.o. female.   Chest Pain Associated symptoms: shortness of breath   Shortness of Breath Associated symptoms: chest pain        Home Medications Prior to Admission medications   Medication Sig Start Date End Date Taking? Authorizing Provider  acetaminophen (TYLENOL) 500 MG tablet Take 1,000 mg by mouth every 6 (six) hours as needed for moderate pain.    [provider]  APAP-Pamabrom-Pyrilamine (PAMPRIN MAX PAIN FORMULA PO) Take 2 tablets by mouth every 6 (six) hours as needed (menstrual cramps).    [provider]  atorvastatin (LIPITOR) 10 MG tablet Take 1 tablet (10 mg total) by mouth daily. 08/15/22   Storm Frisk, MD  celecoxib (CELEBREX) 100 MG capsule SMARTSIG:1 Capsule(s) By Mouth 1-2 Times Daily PRN    [provider]  clobetasol cream (TEMOVATE) 0.05 % Apply 1 Application topically 2 (two) times daily. 01/12/23   Marcine Matar, MD  Fluocinolone Acetonide Scalp 0.01 % OIL SMARTSIG:Topical 3 Times a Week PRN 09/17/22   [provider]  hydrocortisone (ANUSOL-HC) 25 MG suppository Place 25 mg rectally 2 (two) times daily. 08/11/22   [provider]  metFORMIN (GLUCOPHAGE) 500 MG tablet Take 1 tablet (500 mg total) by mouth once daily with breakfast. 08/19/22   Storm Frisk, MD  minoxidil (LONITEN) 2.5 MG tablet Take 2.5 mg by mouth 2 (two) times daily. 11/03/22   [provider]  prazosin (MINIPRESS) 2 MG capsule Take 1 capsule (2 mg total) by mouth at bedtime. 02/09/23   Shanna Cisco, NP  QUEtiapine (SEROQUEL XR) 300 MG 24 hr tablet Take 1 tablet (300 mg total) by mouth at bedtime. 02/09/23   Shanna Cisco, NP  QUEtiapine (SEROQUEL) 50 MG tablet Take 1 tablet (50 mg total) by mouth 2 (two) times daily. 02/09/23   Shanna Cisco, NP  Semaglutide, 2 MG/DOSE, 8 MG/3ML SOPN Inject 2 mg as directed once a week. 02/09/23   Marcine Matar, MD  spironolactone (ALDACTONE) 100 MG tablet Take 1 tablet (100 mg total) by mouth 2 (two) times daily. 08/19/22   Storm Frisk, MD  tiZANidine (ZANAFLEX) 4 MG tablet TAKE 1 TABLET(4 MG) BY MOUTH EVERY 6 HOURS AS NEEDED FOR MUSCLE SPASMS 12/25/22   Marcine Matar, MD  valACYclovir (VALTREX) 1000 MG tablet Take 1 tablet (1,000 mg total) by mouth once daily. 08/15/22   Storm Frisk, MD  zolpidem (AMBIEN) 5 MG tablet Take 1 tablet (5 mg total) by mouth at bedtime as needed for sleep. 02/09/23   Shanna Cisco, NP      Allergies    Patient has no known allergies.    Review of Systems   Review of Systems  Respiratory:  Positive for shortness of breath.   Cardiovascular:  Positive for chest pain.    Physical Exam Updated Vital Signs BP 126/87   Pulse (!) 108   Temp 97.8 F (36.6 C)   Resp 15   Ht 5\' 7"  (1.702 m)   Wt 65.8 kg   LMP 02/22/2023 (Approximate)   SpO2 100%   BMI 22.71 kg/m  Physical Exam  ED Results / Procedures / Treatments   Labs (all labs ordered are listed, but only abnormal results are displayed) Labs Reviewed  BASIC METABOLIC PANEL  CBC  HCG, SERUM, QUALITATIVE  TROPONIN I (HIGH SENSITIVITY)    EKG None  Radiology DG Chest 2 View Result Date: 03/07/2023 CLINICAL DATA:  Chest pain and intermittent shortness of breath EXAM: CHEST - 2 VIEW COMPARISON:  05/22/2021 FINDINGS: Normal cardiomediastinal silhouette. No focal consolidation, pleural effusion, or pneumothorax. No displaced rib fractures. Metallic BB in the right upper lung.- IMPRESSION: No acute cardiopulmonary disease. Electronically Signed   By: Minerva Fester M.D.   On: 03/07/2023 22:03    Procedures Procedures  {Document cardiac monitor,  telemetry assessment procedure when appropriate:1}  Medications Ordered in ED Medications - No data to display  ED Course/ Medical Decision Making/ A&P   {   Click here for ABCD2, HEART and other calculatorsREFRESH Note before signing :1}                              Medical Decision Making Amount and/or Complexity of Data Reviewed Labs: ordered. Radiology: ordered.   ***  {Document critical care time when appropriate:1} {Document review of labs and clinical decision tools ie heart score, Chads2Vasc2 etc:1}  {Document your independent review of radiology images, and any outside records:1} {Document your discussion with family members, caretakers, and with consultants:1} {Document social determinants of health affecting pt's care:1} {Document your decision making why or why not admission, treatments were needed:1} Final Clinical Impression(s) / ED Diagnoses Final diagnoses:  None    Rx / DC Orders ED Discharge Orders     None

## 2023-03-08 DIAGNOSIS — R0789 Other chest pain: Secondary | ICD-10-CM | POA: Diagnosis not present

## 2023-03-08 LAB — D-DIMER, QUANTITATIVE: D-Dimer, Quant: 0.37 ug{FEU}/mL (ref 0.00–0.50)

## 2023-03-08 LAB — MAGNESIUM: Magnesium: 2 mg/dL (ref 1.7–2.4)

## 2023-03-08 LAB — TROPONIN I (HIGH SENSITIVITY): Troponin I (High Sensitivity): <2 ng/L (ref <18)

## 2023-03-08 MED ORDER — ALUM & MAG HYDROXIDE-SIMETH 200-200-20 MG/5ML PO SUSP
30.0000 mL | Freq: Once | ORAL | Status: AC
  Administered 2023-03-08: 30 mL via ORAL
  Filled 2023-03-08: qty 30

## 2023-03-08 MED ORDER — PANTOPRAZOLE SODIUM 20 MG PO TBEC
20.0000 mg | DELAYED_RELEASE_TABLET | Freq: Every day | ORAL | 0 refills | Status: AC
  Filled 2023-03-08: qty 30, 30d supply, fill #0

## 2023-03-08 MED ORDER — ACETAMINOPHEN 500 MG PO TABS
1000.0000 mg | ORAL_TABLET | Freq: Once | ORAL | Status: AC
  Administered 2023-03-08: 1000 mg via ORAL
  Filled 2023-03-08: qty 2

## 2023-03-08 MED ORDER — POTASSIUM CHLORIDE 20 MEQ PO PACK
40.0000 meq | PACK | Freq: Once | ORAL | Status: AC
  Administered 2023-03-08: 40 meq via ORAL
  Filled 2023-03-08: qty 2

## 2023-03-08 MED ORDER — LIDOCAINE VISCOUS HCL 2 % MT SOLN
15.0000 mL | Freq: Once | OROMUCOSAL | Status: AC
  Administered 2023-03-08: 15 mL via ORAL
  Filled 2023-03-08: qty 15

## 2023-03-08 NOTE — ED Notes (Signed)
 ED Provider at bedside.

## 2023-03-08 NOTE — Discharge Instructions (Addendum)
 Your workup today is reassuring. It is very unlikely that your pain is due to an issue in your heart.  Your cardiac enzyme (troponin) was normal today. Your EKG which is a measure of the heart's electrical activity and rhythm is normal today. These would both show abnormalities if you were having a heart attack.  Your chest x-ray is normal today.  You did have a slightly low potassium here today.  You were given a potassium supplement.  Please intake your dietary intake of potassium with food such as bananas, avocados.  Although its only believe this pain is coming from an issue in your heart, I have placed a cardiology referral for you to further discuss your symptoms. If you have not heard from the Cardiology office within the next 72 hours please call [phone].  You have been started on a medication called Protonix to help with acid reflux.  It is possible this is causing some your symptoms.  Please take this once daily as prescribed for the next 4 weeks.    Please follow-up with your PCP within the next month for follow-up regarding your symptoms and recheck of your electrolytes.  Return to the ER if you have any shortness of breath, difficulty breathing, worsening chest pain, dizziness, jaw pain, left arm or shoulder pain, abdominal pain, unexplained fever, any other new or concerning symptoms.

## 2023-03-09 ENCOUNTER — Other Ambulatory Visit: Payer: Self-pay

## 2023-03-16 ENCOUNTER — Other Ambulatory Visit: Payer: Self-pay

## 2023-03-17 ENCOUNTER — Other Ambulatory Visit: Payer: Self-pay

## 2023-03-19 ENCOUNTER — Other Ambulatory Visit: Payer: Self-pay

## 2023-03-27 ENCOUNTER — Encounter: Payer: Self-pay | Admitting: Internal Medicine

## 2023-03-30 ENCOUNTER — Ambulatory Visit: Payer: Self-pay

## 2023-03-30 NOTE — Telephone Encounter (Signed)
 Copied from CRM [id number]. Topic: Clinical - Red Word Triage >> Mar 30, 2023 12:14 PM Gildardo Pounds wrote: Red Word that prompted transfer to Nurse Triage: Throbbing in back of head at cerebellum and involuntary movements while asleep and also whistling sound around head when throbbing. Callback number is [phone].   Chief Complaint: Headache  Symptoms: Headache, whistling sound when throbbing involuntary movements at night  Frequency: Intermittent, constant since last night  Pertinent Negatives: Patient denies numbness, tingling, confusion  Disposition: [] ED /[] Urgent Care (no appt availability in office) / [x] Appointment(In office/virtual)/ []  [facility]/ [] Home Care/ [] Refused Recommended Disposition /[]  Mobile Bus/ []  Follow-up with PCP Additional Notes: Patient reports that she has had intermittent headaches for the last couple of years and wanted to be evaluated and possibly get a referall to a neurologist. She states that her headache returned last night and was 10/10 last night but 5/10 today. She states that her headache sometimes throbs and that she has a whistling sound when that occurs. She denies any numbness, tingling, confusion, or other symptom at this time. She reports that she had a stroke in 2020 and that her currently symptoms are not similar to that time. Appointment made for next Monday for evaluation and placed on wait list. Patient instructed to call back for new or worsening symptoms. Patient verbalized understanding and agreement with this plan.    Reason for Disposition  Headache is a chronic symptom (recurrent or ongoing AND present > 4 weeks)  Answer Assessment - Initial Assessment Questions 1. LOCATION: "Where does it hurt?"      Lower back of head  2. ONSET: "When did the headache start?" (Minutes, hours or days)      Last night 3. PATTERN: "Does the pain come and go, or has it been constant since it started?"     Constant  4. SEVERITY: "How  bad is the pain?" and "What does it keep you from doing?"  (e.g., Scale 1-10; mild, moderate, or severe)   - MILD (1-3): doesn't interfere with normal activities    - MODERATE (4-7): interferes with normal activities or awakens from sleep    - SEVERE (8-10): excruciating pain, unable to do any normal activities        5/10 now, 10/10 at night  5. RECURRENT SYMPTOM: "Have you ever had headaches before?" If Yes, ask: "When was the last time?" and "What happened that time?"      Yes, ongoing for a couple of years  6. CAUSE: "What do you think is causing the headache?"     Unsure  7. MIGRAINE: "Have you been diagnosed with migraine headaches?" If Yes, ask: "Is this headache similar?"      No 8. HEAD INJURY: "Has there been any recent injury to the head?"      No 9. OTHER SYMPTOMS: "Do you have any other symptoms?" (fever, stiff neck, eye pain, sore throat, cold symptoms)     Whistling sound and involuntary movements at night  10. PREGNANCY: "Is there any chance you are pregnant?" "When was your last menstrual period?"       No  Protocols used: Headache-A-AH

## 2023-03-30 NOTE — Telephone Encounter (Signed)
 Spoke with Patient. Patient reports s/s  have improved some. Patient does not have a way to check BP. Adviser patient she should go to UC or to ED to be evaluated.  Advised that just because the s/s she having is not like those from 2020 does not mean she should not assume it could not be a stroke. Patient voiced that she understood and that she is going to the ED. Patient voiced that she was glad the we called her back.

## 2023-03-31 ENCOUNTER — Other Ambulatory Visit: Payer: Self-pay

## 2023-03-31 ENCOUNTER — Other Ambulatory Visit: Payer: Self-pay | Admitting: Critical Care Medicine

## 2023-03-31 NOTE — Telephone Encounter (Signed)
 Requested medication (s) are due for refill today - no  Requested medication (s) are on the active medication list -no  Future visit scheduled -yes  Last refill: unknown  Notes to clinic: non delegated Rx, no longer listed on current medication list   Requested Prescriptions  Pending Prescriptions Disp Refills   ketoconazole (NIZORAL) 2 % shampoo [Pharmacy Med Name: KETOCONAZOLE 2% SHAMPOO 120ML] 120 mL 0    Sig: APPLY TOPICALLY 2 TIMES A WEEK     Not Delegated - Over the Counter: OTC 2 Failed - 03/31/2023  3:35 PM      Failed - This refill cannot be delegated      Passed - Valid encounter within last 12 months    Recent Outpatient Visits           1 month ago Elevated liver function tests   [place] [facility] Dept Of [place]. [facility] Marcine Matar, MD   3 months ago Prediabetes   [place] [facility] [place] - [facility] Dept Of [place]. [facility] Marcine Matar, MD   7 months ago Prediabetes   [place] [facility] [place] - [facility] Dept Of [place]. [facility] Storm Frisk, MD   1 year ago Chronic pain of both knees   [place] [facility] [place] - A Dept Of [place]. [facility] Storm Frisk, MD   1 year ago Prediabetes   [place] [facility] [place] - A Dept Of [place]. [facility] Storm Frisk, MD                 Requested Prescriptions  Pending Prescriptions Disp Refills   ketoconazole (NIZORAL) 2 % shampoo [Pharmacy Med Name: KETOCONAZOLE 2% SHAMPOO 120ML] 120 mL 0    Sig: APPLY TOPICALLY 2 TIMES A WEEK     Not Delegated - Over the Counter: OTC 2 Failed - 03/31/2023  3:35 PM      Failed - This refill cannot be delegated      Passed - Valid encounter within last 12 months    Recent Outpatient Visits           1 month ago Elevated liver function tests   [place] [facility] [place] - [facility] Dept Of [place]. [facility] Marcine Matar, MD   3  months ago Prediabetes   [place] [facility] [place] - A Dept Of [place]. [facility] Marcine Matar, MD   7 months ago Prediabetes   [place] [facility] [place] - A Dept Of [place]. [facility] Storm Frisk, MD   1 year ago Chronic pain of both knees    [facility] [place] - [facility] Dept Of [place]. [facility] Storm Frisk, MD   1 year ago Prediabetes    [facility] [place] - A Dept Of . [facility] Storm Frisk, MD

## 2023-04-01 ENCOUNTER — Other Ambulatory Visit: Payer: Self-pay

## 2023-04-01 ENCOUNTER — Telehealth: Payer: Self-pay

## 2023-04-01 ENCOUNTER — Other Ambulatory Visit: Payer: Self-pay | Admitting: Critical Care Medicine

## 2023-04-01 NOTE — Telephone Encounter (Signed)
 Pt LVM in med refill line requesting rx refills on her metformin and spironolactone for PCOS.   Med refill request: Metformin/Spironolactone Last AEX: 11/18/2022-[facility] Next AEX: 11/19/2023-[facility] Last MMG (if hormonal med): n/a Refill authorized: no current rxs to add to encounter. However, per EMR investigation, last strength and dose of metformin was 500mg  once daily with breakfast. Last dose strength of spironolactone was 100mg  BID
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
## 2023-04-02 NOTE — Telephone Encounter (Signed)
 Per Dr. Kennith Center:  "She is on semaglutide with a normal A1c. I would not recommend metformin at this time.  However, she can discuss this further with her PCP.  I would recommend that she discuss use of spironolactone with dermatology, as she was seen in January to discuss hirsutism and hair loss."  LDVM on machine per DPR and advised the pt to cb with any questions/concerns. Encounter closed.

## 2023-04-06 ENCOUNTER — Telehealth: Payer: Self-pay

## 2023-04-06 ENCOUNTER — Ambulatory Visit (HOSPITAL_COMMUNITY): Payer: Medicare PPO | Admitting: Psychiatry

## 2023-04-06 ENCOUNTER — Other Ambulatory Visit: Payer: Self-pay

## 2023-04-06 ENCOUNTER — Encounter (HOSPITAL_COMMUNITY): Payer: Self-pay

## 2023-04-06 ENCOUNTER — Ambulatory Visit: Attending: Family Medicine | Admitting: Family Medicine

## 2023-04-06 ENCOUNTER — Encounter: Payer: Self-pay | Admitting: Family Medicine

## 2023-04-06 VITALS — BP 107/69 | HR 99 | Ht 67.0 in | Wt 150.2 lb

## 2023-04-06 DIAGNOSIS — R519 Headache, unspecified: Secondary | ICD-10-CM

## 2023-04-06 MED ORDER — TIZANIDINE HCL 4 MG PO TABS
4.0000 mg | ORAL_TABLET | Freq: Three times a day (TID) | ORAL | 1 refills | Status: DC | PRN
  Filled 2023-04-06: qty 60, 20d supply, fill #0

## 2023-04-06 NOTE — Patient Instructions (Signed)
 VISIT SUMMARY:  You came in today with a chronic, severe throbbing sensation in the back of your head that has been ongoing for several years. This sensation is often accompanied by a whistling sound in your ears and is particularly noticeable during sleep, causing involuntary head movements and disturbing your rest. You also experience a period of disorientation and voice restraint upon waking. Additionally, you have a history of chronic neck pain and have previously undergone neck surgery.  YOUR PLAN:  -OCCIPITAL HEADACHE: An occipital headache is a type of headache that occurs in the back of the head. Your symptoms do not match those of a migraine, sinusitis, or increased intracranial pressure. The doctor suspects it may be due to muscle tension or stress. You have been prescribed tizanidine to help relax your muscles. If your symptoms persist, you will be referred to a neurologist for further evaluation.  -CHRONIC NECK PAIN: Chronic neck pain is long-lasting pain in the neck area. You have been previously evaluated by orthopedic and physical medicine specialists. The doctor recommends continuing with the current management plan as advised by your previous specialists.  INSTRUCTIONS:  If your occipital headache symptoms persist, please follow up with a neurologist for further evaluation.

## 2023-04-06 NOTE — Telephone Encounter (Signed)
 Copied from CRM [id number]. Topic: Referral - Question >> Apr 06, 2023  2:21 PM Sarah Evans wrote: Reason for CRM: The patient is requesting her referral be sent to [facility] Neurologist Fax: [phone] with her notes and demographic information or if there is another Neurologist that could see her sooner in [place] as the wait list was too long at [facility].

## 2023-04-06 NOTE — Progress Notes (Signed)
 Subjective:  Patient ID: Sarah Evans, female    DOB: [birth year]  Age: 43 y.o. MRN: [id number]  CC: Headache (Pain in the back of head/)     Discussed the use of AI scribe software for clinical note transcription with the patient, who gave verbal consent to proceed.  History of Present Illness The patient, a 43 year old female patient of Dr. Laural Benes with a history of GERD, schizoaffective disorder, bipolar type, major depressive disorder, generalized anxiety disorder, obstructive sleep apnea, prediabetes, hypolipidemia, and PCOS, presents with a chronic, severe throbbing sensation in the back of the head.  This sensation, distinct from a headache, has been ongoing for several years and is often accompanied by a whistling sound in the ears. The throbbing is particularly noticeable during sleep, causing involuntary head movements and disturbing the patient's rest. Upon waking, the patient experiences a period of disorientation and voice restraint. The throbbing sensation is present intermittently throughout the day and is exacerbated by lying on the back. The patient also has a history of chronic neck pain.  She denies presence of sinus symptoms, photophobia, numbness in her upper extremities, nausea or vomiting and does not wear glasses. She would like to be evaluated by neurology for the symptoms.    Past Medical History:  Diagnosis Date   Bipolar 1 disorder (HCC)    Gallstone    Herpes simplex virus (HSV) infection    dx 2001   Polycystic ovary disease    PTSD (post-traumatic stress disorder)    Schizophrenia (HCC)    Stroke [facility])     Past Surgical History:  Procedure Laterality Date   BUBBLE STUDY  05/25/2020   Procedure: BUBBLE STUDY;  Surgeon: Chrystie Nose, MD;  Location: [facility] ENDOSCOPY;  Service: Cardiovascular;;   NO PAST SURGERIES     TEE WITHOUT CARDIOVERSION N/A 05/25/2020   Procedure: TRANSESOPHAGEAL ECHOCARDIOGRAM (TEE);  Surgeon: Chrystie Nose, MD;  Location: [facility]  ENDOSCOPY;  Service: Cardiovascular;  Laterality: N/A;    Family History  Problem Relation Age of Onset   Bipolar disorder Mother    Diabetes Maternal Grandmother    Hypertension Neg Hx    Heart disease Neg Hx    Cancer Neg Hx     Social History   Socioeconomic History   Marital status: Single    Spouse name: Not on file   Number of children: Not on file   Years of education: Not on file   Highest education level: Not on file  Occupational History   Not on file  Tobacco Use   Smoking status: Former    Current packs/day: 0.00    Average packs/day: 0.3 packs/day for 15.0 years (3.8 ttl pk-yrs)    Types: Cigarettes    Start date: 05/02/2005    Quit date: 05/02/2020    Years since quitting: 2.9   Smokeless tobacco: Never  Vaping Use   Vaping status: Never Used  Substance and Sexual Activity   Alcohol use: No   Drug use: No   Sexual activity: Not Currently    Birth control/protection: Abstinence  Other Topics Concern   Not on file  Social History Narrative   Not on file   Social Drivers of Health   Financial Resource Strain: High Risk (12/19/2022)   Overall Financial Resource Strain (CARDIA)    Difficulty of Paying Living Expenses: Very hard  Food Insecurity: No Food Insecurity (12/19/2022)   Hunger Vital Sign    Worried About Running Out of Food in the Last  Year: Never true    Ran Out of Food in the Last Year: Never true  Transportation Needs: No Transportation Needs (12/19/2022)   PRAPARE - Administrator, Civil Service (Medical): No    Lack of Transportation (Non-Medical): No  Physical Activity: Insufficiently Active (12/19/2022)   Exercise Vital Sign    Days of Exercise per Week: 2 days    Minutes of Exercise per Session: 20 min  Stress: No Stress Concern Present (12/19/2022)   [employer] of Occupational Health - Occupational Stress Questionnaire    Feeling of Stress : Not at all  Social Connections: Socially Isolated (12/19/2022)    Social Connection and Isolation Panel [NHANES]    Frequency of Communication with Friends and Family: Once a week    Frequency of Social Gatherings with Friends and Family: Once a week    Attends Religious Services: Never    Database administrator or Organizations: No    Attends Engineer, structural: Never    Marital Status: Never married    No Known Allergies  Outpatient Medications Prior to Visit  Medication Sig Dispense Refill   acetaminophen (TYLENOL) 500 MG tablet Take 1,000 mg by mouth every 6 (six) hours as needed for moderate pain.     atorvastatin (LIPITOR) 10 MG tablet Take 1 tablet (10 mg total) by mouth daily. 90 tablet 3   ketoconazole (NIZORAL) 2 % shampoo APPLY TOPICALLY 2 TIMES A WEEK 120 mL 0   minoxidil (LONITEN) 2.5 MG tablet Take 5 mg by mouth daily.     pantoprazole (PROTONIX) 20 MG tablet Take 1 tablet (20 mg total) by mouth daily. 30 tablet 0   prazosin (MINIPRESS) 2 MG capsule Take 1 capsule (2 mg total) by mouth at bedtime. (Patient taking differently: Take 2 mg by mouth at bedtime as needed.) 30 capsule 3   Prenatal Vit-Fe Fumarate-FA (PRENATAL MULTIVITAMIN) TABS tablet Take 1 tablet by mouth every evening.     QUEtiapine (SEROQUEL XR) 300 MG 24 hr tablet Take 1 tablet (300 mg total) by mouth at bedtime. 30 tablet 3   Semaglutide, 2 MG/DOSE, 8 MG/3ML SOPN Inject 2 mg as directed once a week. 3 mL 2   valACYclovir (VALTREX) 1000 MG tablet Take 1 tablet (1,000 mg total) by mouth once daily. (Patient taking differently: Take 1,000 mg by mouth daily as needed.) 60 tablet 2   zolpidem (AMBIEN) 5 MG tablet Take 1 tablet (5 mg total) by mouth at bedtime as needed for sleep. 15 tablet 1   hydrocortisone (ANUSOL-HC) 25 MG suppository Place 25 mg rectally 2 (two) times daily.     tiZANidine (ZANAFLEX) 4 MG tablet TAKE 1 TABLET(4 MG) BY MOUTH EVERY 6 HOURS AS NEEDED FOR MUSCLE SPASMS (Patient not taking: Reported on 04/06/2023) 30 tablet 1   No facility-administered  medications prior to visit.     ROS Review of Systems  Constitutional:  Negative for activity change and appetite change.  HENT:  Negative for sinus pressure and sore throat.   Respiratory:  Negative for chest tightness, shortness of breath and wheezing.   Cardiovascular:  Negative for chest pain and palpitations.  Gastrointestinal:  Negative for abdominal distention, abdominal pain and constipation.  Genitourinary: Negative.   Musculoskeletal: Negative.   Neurological:  Positive for headaches.  Psychiatric/Behavioral:  Negative for behavioral problems and dysphoric mood.     Objective:  BP 107/69   Pulse 99   Ht 5\' 7"  (1.702 m)   Wt 150 lb  3.2 oz (68.1 kg)   LMP 02/22/2023 (Approximate)   SpO2 100%   BMI 23.52 kg/m      04/06/2023    8:58 AM 03/08/2023    4:00 AM 03/08/2023    3:00 AM  BP/Weight  Systolic BP 107 110 114  Diastolic BP 69 77 72  Wt. (Lbs) 150.2    BMI 23.52 kg/m2        Physical Exam Constitutional:      Appearance: She is well-developed.  Cardiovascular:     Rate and Rhythm: Normal rate.     Heart sounds: Normal heart sounds. No murmur heard. Pulmonary:     Effort: Pulmonary effort is normal.     Breath sounds: Normal breath sounds. No wheezing or rales.  Chest:     Chest wall: No tenderness.  Abdominal:     General: Bowel sounds are normal. There is no distension.     Palpations: Abdomen is soft. There is no mass.     Tenderness: There is no abdominal tenderness.  Musculoskeletal:        General: Normal range of motion.     Cervical back: No rigidity or tenderness.     Right lower leg: No edema.     Left lower leg: No edema.  Neurological:     Mental Status: She is alert and oriented to person, place, and time.     Cranial Nerves: No cranial nerve deficit.     Motor: No weakness.  Psychiatric:        Mood and Affect: Mood normal.        Latest Ref Rng & Units 03/07/2023    9:58 PM 02/09/2023    4:23 PM 02/09/2023    4:22 PM  CMP   Glucose 70 - 99 mg/dL 79   78   BUN 6 - 20 mg/dL 12   7   Creatinine 1.61 - 1.00 mg/dL 0.96   0.45   Sodium 409 - 145 mmol/L 139   143   Potassium 3.5 - 5.1 mmol/L 3.3   4.4   Chloride 98 - 111 mmol/L 105   107   CO2 22 - 32 mmol/L 27   22   Calcium 8.9 - 10.3 mg/dL 9.0   9.2   Total Protein 6.0 - 8.5 g/dL  6.4  6.7   Total Bilirubin 0.0 - 1.2 mg/dL  0.4  0.4   Alkaline Phos 44 - 121 IU/L  79  82   AST 0 - 40 IU/L  21  24   ALT 0 - 32 IU/L  47  50     Lipid Panel     Component Value Date/Time   CHOL 188 02/09/2023 1620   TRIG 61 02/09/2023 1620   HDL 71 02/09/2023 1620   CHOLHDL 2.6 02/09/2023 1620   CHOLHDL 2.8 05/24/2020 0332   VLDL 28 05/24/2020 0332   LDLCALC 106 (H) 02/09/2023 1620    CBC    Component Value Date/Time   WBC 6.4 03/07/2023 2158   RBC 4.19 03/07/2023 2158   HGB 12.1 03/07/2023 2158   HGB 12.3 02/09/2023 1621   HCT 38.3 03/07/2023 2158   HCT 39.6 02/09/2023 1621   PLT 336 03/07/2023 2158   PLT 379 02/09/2023 1621   MCV 91.4 03/07/2023 2158   MCV 92 02/09/2023 1621   MCH 28.9 03/07/2023 2158   MCHC 31.6 03/07/2023 2158   RDW 13.2 03/07/2023 2158   RDW 13.1 02/09/2023 1621   LYMPHSABS 1.8  02/09/2023 1621   MONOABS 0.5 05/22/2021 1555   EOSABS 0.1 02/09/2023 1621   BASOSABS 0.1 02/09/2023 1621    Lab Results  Component Value Date   HGBA1C 5.4 02/09/2023       Assessment & Plan Occipital Headache Chronic severe throbbing in the occipital region for several years, disturbing sleep. No vomiting or visual disturbances. Symptoms inconsistent with migraine, sinusitis, or increased intracranial pressure. Normal cranial nerve function. Differential diagnosis includes muscle tension or stress-related etiology. Consider muscle relaxant therapy as initial management. - Prescribe tizanidine as needed for muscle relaxation. - Refer to neurology for further evaluation per patient request  Chronic Neck Pain Chronic neck pain previously evaluated by  orthopedic and physical medicine specialists. No recent interventions  -She has tizanidine on her med list but has not been taking it regularly - Continue current management plan as per previous specialists' recommendations.      No orders of the defined types were placed in this encounter.   Follow-up: No follow-ups on file.       Hoy Register, MD, FAAFP. [facility] [place], [place] [phone]   04/06/2023, 9:05 AM

## 2023-04-07 NOTE — Telephone Encounter (Signed)
 Noted! Thank you

## 2023-04-08 ENCOUNTER — Telehealth: Payer: Self-pay | Admitting: Orthopaedic Surgery

## 2023-04-08 NOTE — Telephone Encounter (Signed)
 Pt called stating she would like to move forward per discussion with Dr Roda Shutters for bil knee MRI. Pt phone number is [phone].

## 2023-04-09 ENCOUNTER — Telehealth (HOSPITAL_COMMUNITY): Payer: Self-pay | Admitting: *Deleted

## 2023-04-09 ENCOUNTER — Telehealth (HOSPITAL_BASED_OUTPATIENT_CLINIC_OR_DEPARTMENT_OTHER): Admitting: Psychiatry

## 2023-04-09 ENCOUNTER — Other Ambulatory Visit: Payer: Self-pay

## 2023-04-09 ENCOUNTER — Encounter (HOSPITAL_COMMUNITY): Payer: Self-pay | Admitting: Psychiatry

## 2023-04-09 VITALS — Wt 150.0 lb

## 2023-04-09 DIAGNOSIS — M1712 Unilateral primary osteoarthritis, left knee: Secondary | ICD-10-CM

## 2023-04-09 DIAGNOSIS — F5105 Insomnia due to other mental disorder: Secondary | ICD-10-CM | POA: Diagnosis not present

## 2023-04-09 DIAGNOSIS — F250 Schizoaffective disorder, bipolar type: Secondary | ICD-10-CM | POA: Diagnosis not present

## 2023-04-09 DIAGNOSIS — M1711 Unilateral primary osteoarthritis, right knee: Secondary | ICD-10-CM

## 2023-04-09 DIAGNOSIS — F4310 Post-traumatic stress disorder, unspecified: Secondary | ICD-10-CM | POA: Diagnosis not present

## 2023-04-09 DIAGNOSIS — F99 Mental disorder, not otherwise specified: Secondary | ICD-10-CM | POA: Diagnosis not present

## 2023-04-09 MED ORDER — ZOLPIDEM TARTRATE 5 MG PO TABS
5.0000 mg | ORAL_TABLET | Freq: Every evening | ORAL | 1 refills | Status: DC | PRN
  Filled 2023-04-09: qty 15, 15d supply, fill #0
  Filled 2023-05-04: qty 15, 15d supply, fill #1

## 2023-04-09 MED ORDER — QUETIAPINE FUMARATE 50 MG PO TABS
50.0000 mg | ORAL_TABLET | Freq: Two times a day (BID) | ORAL | 1 refills | Status: DC
  Filled 2023-04-09: qty 60, 30d supply, fill #0
  Filled 2023-05-04: qty 60, 30d supply, fill #1

## 2023-04-09 NOTE — Telephone Encounter (Signed)
 Called patient. No answer. LMOM that MRIs have been ordered.
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
## 2023-04-09 NOTE — Telephone Encounter (Signed)
 Ok that's fine

## 2023-04-10 DIAGNOSIS — L810 Postinflammatory hyperpigmentation: Secondary | ICD-10-CM | POA: Diagnosis not present

## 2023-04-10 DIAGNOSIS — L2089 Other atopic dermatitis: Secondary | ICD-10-CM | POA: Diagnosis not present

## 2023-04-10 DIAGNOSIS — D229 Melanocytic nevi, unspecified: Secondary | ICD-10-CM | POA: Diagnosis not present

## 2023-04-10 DIAGNOSIS — L658 Other specified nonscarring hair loss: Secondary | ICD-10-CM | POA: Diagnosis not present

## 2023-04-10 DIAGNOSIS — R609 Edema, unspecified: Secondary | ICD-10-CM | POA: Diagnosis not present

## 2023-04-10 DIAGNOSIS — L818 Other specified disorders of pigmentation: Secondary | ICD-10-CM | POA: Diagnosis not present

## 2023-04-10 NOTE — Telephone Encounter (Signed)
 Called pt and noted by provider  JNL

## 2023-04-11 ENCOUNTER — Other Ambulatory Visit (HOSPITAL_COMMUNITY): Payer: Self-pay | Admitting: Psychiatry

## 2023-04-11 DIAGNOSIS — F250 Schizoaffective disorder, bipolar type: Secondary | ICD-10-CM

## 2023-04-13 ENCOUNTER — Other Ambulatory Visit: Payer: Self-pay

## 2023-04-13 ENCOUNTER — Encounter: Payer: Self-pay | Admitting: Orthopaedic Surgery

## 2023-04-14 DIAGNOSIS — M25561 Pain in right knee: Secondary | ICD-10-CM | POA: Diagnosis not present

## 2023-04-14 DIAGNOSIS — M25562 Pain in left knee: Secondary | ICD-10-CM | POA: Diagnosis not present

## 2023-04-14 DIAGNOSIS — M170 Bilateral primary osteoarthritis of knee: Secondary | ICD-10-CM | POA: Diagnosis not present

## 2023-04-14 DIAGNOSIS — R262 Difficulty in walking, not elsewhere classified: Secondary | ICD-10-CM | POA: Diagnosis not present

## 2023-04-21 DIAGNOSIS — M25561 Pain in right knee: Secondary | ICD-10-CM | POA: Diagnosis not present

## 2023-04-21 DIAGNOSIS — M170 Bilateral primary osteoarthritis of knee: Secondary | ICD-10-CM | POA: Diagnosis not present

## 2023-04-21 DIAGNOSIS — M25562 Pain in left knee: Secondary | ICD-10-CM | POA: Diagnosis not present

## 2023-04-21 DIAGNOSIS — R262 Difficulty in walking, not elsewhere classified: Secondary | ICD-10-CM | POA: Diagnosis not present

## 2023-04-23 ENCOUNTER — Ambulatory Visit (HOSPITAL_COMMUNITY): Payer: Medicare PPO | Admitting: Clinical

## 2023-04-25 ENCOUNTER — Other Ambulatory Visit

## 2023-04-28 DIAGNOSIS — R262 Difficulty in walking, not elsewhere classified: Secondary | ICD-10-CM | POA: Diagnosis not present

## 2023-04-28 DIAGNOSIS — M25562 Pain in left knee: Secondary | ICD-10-CM | POA: Diagnosis not present

## 2023-04-28 DIAGNOSIS — M25561 Pain in right knee: Secondary | ICD-10-CM | POA: Diagnosis not present

## 2023-04-28 DIAGNOSIS — M170 Bilateral primary osteoarthritis of knee: Secondary | ICD-10-CM | POA: Diagnosis not present

## 2023-04-30 DIAGNOSIS — R590 Localized enlarged lymph nodes: Secondary | ICD-10-CM | POA: Diagnosis not present

## 2023-04-30 DIAGNOSIS — R609 Edema, unspecified: Secondary | ICD-10-CM | POA: Diagnosis not present

## 2023-04-30 DIAGNOSIS — R600 Localized edema: Secondary | ICD-10-CM | POA: Diagnosis not present

## 2023-05-04 ENCOUNTER — Ambulatory Visit: Payer: Self-pay | Admitting: Surgery

## 2023-05-04 ENCOUNTER — Other Ambulatory Visit: Payer: Self-pay | Admitting: Internal Medicine

## 2023-05-04 ENCOUNTER — Telehealth: Payer: Self-pay | Admitting: Pharmacist

## 2023-05-04 ENCOUNTER — Other Ambulatory Visit: Payer: Self-pay

## 2023-05-04 DIAGNOSIS — Z8719 Personal history of other diseases of the digestive system: Secondary | ICD-10-CM | POA: Diagnosis not present

## 2023-05-04 DIAGNOSIS — K644 Residual hemorrhoidal skin tags: Secondary | ICD-10-CM | POA: Diagnosis not present

## 2023-05-04 DIAGNOSIS — K642 Third degree hemorrhoids: Secondary | ICD-10-CM | POA: Diagnosis not present

## 2023-05-04 MED ORDER — SEMAGLUTIDE (2 MG/DOSE) 8 MG/3ML ~~LOC~~ SOPN
2.0000 mg | PEN_INJECTOR | SUBCUTANEOUS | 2 refills | Status: DC
  Filled 2023-05-04: qty 3, 28d supply, fill #0
  Filled 2023-06-01: qty 3, 28d supply, fill #1

## 2023-05-04 NOTE — Telephone Encounter (Signed)
Can we start a PA for this patient's Ozempic? 

## 2023-05-05 ENCOUNTER — Other Ambulatory Visit: Payer: Self-pay

## 2023-05-05 DIAGNOSIS — M170 Bilateral primary osteoarthritis of knee: Secondary | ICD-10-CM | POA: Diagnosis not present

## 2023-05-05 DIAGNOSIS — R262 Difficulty in walking, not elsewhere classified: Secondary | ICD-10-CM | POA: Diagnosis not present

## 2023-05-05 DIAGNOSIS — M1712 Unilateral primary osteoarthritis, left knee: Secondary | ICD-10-CM | POA: Diagnosis not present

## 2023-05-05 DIAGNOSIS — M25561 Pain in right knee: Secondary | ICD-10-CM | POA: Diagnosis not present

## 2023-05-05 DIAGNOSIS — M25562 Pain in left knee: Secondary | ICD-10-CM | POA: Diagnosis not present

## 2023-05-05 DIAGNOSIS — M25661 Stiffness of right knee, not elsewhere classified: Secondary | ICD-10-CM | POA: Diagnosis not present

## 2023-05-05 DIAGNOSIS — M25662 Stiffness of left knee, not elsewhere classified: Secondary | ICD-10-CM | POA: Diagnosis not present

## 2023-05-05 DIAGNOSIS — M1711 Unilateral primary osteoarthritis, right knee: Secondary | ICD-10-CM | POA: Diagnosis not present

## 2023-05-06 ENCOUNTER — Other Ambulatory Visit: Payer: Self-pay

## 2023-05-08 ENCOUNTER — Telehealth: Payer: Self-pay

## 2023-05-08 ENCOUNTER — Encounter: Payer: Self-pay | Admitting: Internal Medicine

## 2023-05-08 NOTE — Telephone Encounter (Signed)
 Patient asking if she can have another injection with Dr. Daisey Dryer

## 2023-05-12 ENCOUNTER — Other Ambulatory Visit: Payer: Self-pay | Admitting: Internal Medicine

## 2023-05-12 ENCOUNTER — Other Ambulatory Visit: Payer: Self-pay | Admitting: Physical Medicine and Rehabilitation

## 2023-05-12 DIAGNOSIS — G8929 Other chronic pain: Secondary | ICD-10-CM

## 2023-05-12 DIAGNOSIS — M47816 Spondylosis without myelopathy or radiculopathy, lumbar region: Secondary | ICD-10-CM

## 2023-05-12 MED ORDER — KETOCONAZOLE 2 % EX SHAM: MEDICATED_SHAMPOO | CUTANEOUS | 3 refills | Status: AC

## 2023-05-13 DIAGNOSIS — M542 Cervicalgia: Secondary | ICD-10-CM | POA: Diagnosis not present

## 2023-05-13 DIAGNOSIS — G959 Disease of spinal cord, unspecified: Secondary | ICD-10-CM | POA: Diagnosis not present

## 2023-05-13 DIAGNOSIS — Z8673 Personal history of transient ischemic attack (TIA), and cerebral infarction without residual deficits: Secondary | ICD-10-CM | POA: Diagnosis not present

## 2023-05-13 DIAGNOSIS — R569 Unspecified convulsions: Secondary | ICD-10-CM | POA: Diagnosis not present

## 2023-05-13 DIAGNOSIS — G8929 Other chronic pain: Secondary | ICD-10-CM | POA: Diagnosis not present

## 2023-05-14 ENCOUNTER — Other Ambulatory Visit: Payer: Self-pay | Admitting: Physical Medicine and Rehabilitation

## 2023-05-14 MED ORDER — DIAZEPAM 5 MG PO TABS: ORAL_TABLET | ORAL | 0 refills | Status: AC

## 2023-05-15 DIAGNOSIS — M170 Bilateral primary osteoarthritis of knee: Secondary | ICD-10-CM | POA: Diagnosis not present

## 2023-05-15 DIAGNOSIS — M25562 Pain in left knee: Secondary | ICD-10-CM | POA: Diagnosis not present

## 2023-05-15 DIAGNOSIS — M25561 Pain in right knee: Secondary | ICD-10-CM | POA: Diagnosis not present

## 2023-05-15 DIAGNOSIS — R262 Difficulty in walking, not elsewhere classified: Secondary | ICD-10-CM | POA: Diagnosis not present

## 2023-05-23 DIAGNOSIS — R569 Unspecified convulsions: Secondary | ICD-10-CM | POA: Diagnosis not present

## 2023-05-23 DIAGNOSIS — M542 Cervicalgia: Secondary | ICD-10-CM | POA: Diagnosis not present

## 2023-05-23 DIAGNOSIS — Z8673 Personal history of transient ischemic attack (TIA), and cerebral infarction without residual deficits: Secondary | ICD-10-CM | POA: Diagnosis not present

## 2023-05-23 DIAGNOSIS — G8929 Other chronic pain: Secondary | ICD-10-CM | POA: Diagnosis not present

## 2023-05-23 DIAGNOSIS — G96191 Perineural cyst: Secondary | ICD-10-CM | POA: Diagnosis not present

## 2023-05-23 DIAGNOSIS — R519 Headache, unspecified: Secondary | ICD-10-CM | POA: Diagnosis not present

## 2023-05-23 DIAGNOSIS — G959 Disease of spinal cord, unspecified: Secondary | ICD-10-CM | POA: Diagnosis not present

## 2023-05-26 ENCOUNTER — Telehealth: Payer: Self-pay | Admitting: Internal Medicine

## 2023-05-26 DIAGNOSIS — Z8673 Personal history of transient ischemic attack (TIA), and cerebral infarction without residual deficits: Secondary | ICD-10-CM

## 2023-05-26 NOTE — Telephone Encounter (Signed)
 Copied from CRM [id number]. Topic: General - Other >> May 26, 2023  1:57 PM Antwanette L wrote:  Reason for CRM: Patient is having hemorrhoid surgery on 6/6 and will need a caregiver for month. Patient is requesting someone from the office to contact Kamrar List at [phone] to get a service of form faxed over to  Dr. Lincoln Renshaw.The patient can be contacted by phone at [phone] or [phone]

## 2023-05-27 NOTE — Telephone Encounter (Signed)
 I called the patient and she explained that she said she was told she will be in a lot of pain after her surgery and will need help at home for about a month.  She said she will need help around the house and putting on her knee braces.  I explained that Medicaid will pay for PCS for assistance with ADLs, the focus of the service is to provide personal care, not housekeeping.  She said she understood and needs personal care help.  She said she has no family or friends that can assist her.  I explained to her that after a referral is placed a nurse will come out to assess her and if she qualifies for services, they will determine how many hours/ day and days/ week she is eligible to receive care.  I also explained that it has been more than 90 days since she has seen her PCP and Medicaid requires a face to face encounter within 90 days prior to the referral.  She had an appointment with Dr Newlin more recently but [facility] services were not discussed.  She has an upcoming appointment with Dr Lincoln Renshaw on 06/05/2023 and I urged her to keep that appointment and a PCS referral can be done at that time. I said if an expedited referral was needed that would have to be placed from the hospital. She then went on to say that she needs someone to take her to the surgery and wait at the surgical center while she has the procedure and then stay with her for 24 hours post op  She stated this is required by the surgical center.  I explained that her insurance will not cover that and she will need to find someone to transport and stay with her.  Even if she was approved for PCS, they would not cover a person waiting for her during the surgical procedure She said she understood and would need to look for someone to help and may need to postpone the procedure  She said she was very appreciative for the clarification of what can be provided.  I offered to refer her to [facility] for information about extra insurance benefits and she was in  agreement to placing that referral.  I again encouraged her to keep the upcoming appointment with Dr Lincoln Renshaw and she said she understood

## 2023-05-27 NOTE — Addendum Note (Signed)
 Addended by: Kenijah Benningfield on: 05/27/2023 03:55 PM   Modules accepted: Orders

## 2023-05-27 NOTE — Telephone Encounter (Signed)
 Noted! Thank you

## 2023-06-02 ENCOUNTER — Other Ambulatory Visit: Payer: Self-pay

## 2023-06-02 ENCOUNTER — Ambulatory Visit (INDEPENDENT_AMBULATORY_CARE_PROVIDER_SITE_OTHER): Admitting: Physical Medicine and Rehabilitation

## 2023-06-02 VITALS — BP 121/82 | HR 101

## 2023-06-02 DIAGNOSIS — M47816 Spondylosis without myelopathy or radiculopathy, lumbar region: Secondary | ICD-10-CM

## 2023-06-02 MED ORDER — METHYLPREDNISOLONE ACETATE 40 MG/ML IJ SUSP
40.0000 mg | Freq: Once | INTRAMUSCULAR | Status: AC
  Administered 2023-06-02: 40 mg

## 2023-06-02 NOTE — Progress Notes (Unsigned)
 Pain Scale   Average Pain 7 Patient advised her lower back pain get worse when bending and stooping. Patient states she does get some relief with resting.        +Driver, -BT, -Dye Allergies.

## 2023-06-02 NOTE — Patient Instructions (Signed)

## 2023-06-03 NOTE — Procedures (Signed)
 Lumbar Facet Joint Intra-Articular Injection(s) with Fluoroscopic Guidance  Patient: Sarah Evans      Date of Birth: 1980/12/24 MRN: [id number] PCP: Lawrance Presume, MD      Visit Date: 06/02/2023   Universal Protocol:    Date/Time: 06/02/2023  Consent Given By: the patient  Position: PRONE   Additional Comments: Vital signs were monitored before and after the procedure. Patient was prepped and draped in the usual sterile fashion. The correct patient, procedure, and site was verified.   Injection Procedure Details:  Procedure Site One Meds Administered:  Meds ordered this encounter  Medications   methylPREDNISolone  acetate (DEPO-MEDROL ) injection 40 mg     Laterality: Bilateral  Location/Site:  L4-L5  Needle size: 22 guage  Needle type: Spinal  Needle Placement: Articular  Findings:  -Comments: Excellent flow of contrast producing a partial arthrogram.  Procedure Details: The fluoroscope beam is vertically oriented in AP, and the inferior recess is visualized beneath the lower pole of the inferior apophyseal process, which represents the target point for needle insertion. When direct visualization is difficult the target point is located at the medial projection of the vertebral pedicle. The region overlying each aforementioned target is locally anesthetized with a 1 to 2 ml. volume of 1% Lidocaine  without Epinephrine.   The spinal needle was inserted into each of the above mentioned facet joints using biplanar fluoroscopic guidance. A 0.25 to 0.5 ml. volume of Isovue-250 was injected and a partial facet joint arthrogram was obtained. A single spot film was obtained of the resulting arthrogram.    One to 1.25 ml of the steroid/anesthetic solution was then injected into each of the facet joints noted above.   Additional Comments:  The patient tolerated the procedure well Dressing: 2 x 2 sterile gauze and Band-Aid    Post-procedure details: Patient was observed  during the procedure. Post-procedure instructions were reviewed.  Patient left the clinic in stable condition.

## 2023-06-03 NOTE — Progress Notes (Signed)
 Sarah Evans - 43 y.o. female MRN [id number]  Date of birth: 1980/12/01  Office Visit Note: Visit Date: 06/02/2023 PCP: Lawrance Presume, MD Referred by: Lawrance Presume, MD  Subjective: Chief Complaint  Patient presents with   Lower Back - Pain   HPI:  Sarah Evans is a 43 y.o. female who comes in today for planned repeat Bilateral L4-5 Lumbar facet/medial branch block with fluoroscopic guidance.  The patient has failed conservative care including home exercise, medications, time and activity modification.  This injection will be diagnostic and hopefully therapeutic.  Please see requesting physician notes for further details and justification.  Exam shows concordant low back pain with facet joint loading and extension. Patient received more than 80% pain relief from prior injection.      Referring:Megan Broadus Canes, FNP and Dr. Colette Davies   ROS Otherwise per HPI.  Assessment & Plan: Visit Diagnoses:    ICD-10-CM   1. Spondylosis without myelopathy or radiculopathy, lumbar region  M47.816 XR C-ARM NO REPORT    Facet Injection    methylPREDNISolone  acetate (DEPO-MEDROL ) injection 40 mg      Plan: No additional findings.   Meds & Orders:  Meds ordered this encounter  Medications   methylPREDNISolone  acetate (DEPO-MEDROL ) injection 40 mg    Orders Placed This Encounter  Procedures   Facet Injection   XR C-ARM NO REPORT    Follow-up: Return for visit to requesting provider as needed.   Procedures: No procedures performed  Lumbar Facet Joint Intra-Articular Injection(s) with Fluoroscopic Guidance  Patient: Sarah Evans      Date of Birth: 1980-12-06 MRN: [id number] PCP: Lawrance Presume, MD      Visit Date: 06/02/2023   Universal Protocol:    Date/Time: 06/02/2023  Consent Given By: the patient  Position: PRONE   Additional Comments: Vital signs were monitored before and after the procedure. Patient was prepped and draped in the usual sterile  fashion. The correct patient, procedure, and site was verified.   Injection Procedure Details:  Procedure Site One Meds Administered:  Meds ordered this encounter  Medications   methylPREDNISolone  acetate (DEPO-MEDROL ) injection 40 mg     Laterality: Bilateral  Location/Site:  L4-L5  Needle size: 22 guage  Needle type: Spinal  Needle Placement: Articular  Findings:  -Comments: Excellent flow of contrast producing a partial arthrogram.  Procedure Details: The fluoroscope beam is vertically oriented in AP, and the inferior recess is visualized beneath the lower pole of the inferior apophyseal process, which represents the target point for needle insertion. When direct visualization is difficult the target point is located at the medial projection of the vertebral pedicle. The region overlying each aforementioned target is locally anesthetized with a 1 to 2 ml. volume of 1% Lidocaine  without Epinephrine.   The spinal needle was inserted into each of the above mentioned facet joints using biplanar fluoroscopic guidance. A 0.25 to 0.5 ml. volume of Isovue-250 was injected and a partial facet joint arthrogram was obtained. A single spot film was obtained of the resulting arthrogram.    One to 1.25 ml of the steroid/anesthetic solution was then injected into each of the facet joints noted above.   Additional Comments:  The patient tolerated the procedure well Dressing: 2 x 2 sterile gauze and Band-Aid    Post-procedure details: Patient was observed during the procedure. Post-procedure instructions were reviewed.  Patient left the clinic in stable condition.    Clinical History: CLINICAL DATA:  Mid-back pain,  spondyloarthropathy suspected, xray done; Low back pain, symptoms persist with > 6 wks treatment.   EXAM: MRI THORACIC AND LUMBAR SPINE WITHOUT CONTRAST   TECHNIQUE: Multiplanar and multiecho pulse sequences of the thoracic and lumbar spine were obtained without  intravenous contrast.   COMPARISON:  Cervical and lumbar spine radiographs 05/30/2022.   FINDINGS: MRI THORACIC SPINE FINDINGS   Alignment:  Normal.   Vertebrae: No fracture, evidence of discitis, or bone lesion.   Cord:  Normal spinal cord signal and volume.   Paraspinal and other soft tissues: Cholelithiasis. Metal artifact from metallic foreign body in the right upper lung.   Disc levels:   No disc herniation, spinal canal stenosis or neural foraminal narrowing.   MRI LUMBAR SPINE FINDINGS   Segmentation:  Standard.   Alignment:  Normal.   Vertebrae:  No fracture, evidence of discitis, or bone lesion.   Conus medullaris and cauda equina: Conus extends to the L1 level. Conus and cauda equina appear normal.   Paraspinal and other soft tissues: Unremarkable.   Disc levels:   T12-L1:  Normal.   L1-L2:  Normal.   L2-L3:  Normal.   L3-L4:  Normal.   L4-L5: Small disc bulge and mild bilateral facet arthropathy. No spinal canal stenosis or neural foraminal narrowing.   L5-S1: No disc herniation, spinal canal stenosis or neural foraminal narrowing. Mild bilateral facet arthropathy.   IMPRESSION: 1. No spinal canal stenosis or neural foraminal narrowing in the thoracic or lumbar spine. 2. Mild bilateral facet arthropathy at L4-L5 and L5-S1. 3. Cholelithiasis.     Electronically Signed   By: Audra Blend M.D.   On: 11/10/2022 13:06     Objective:  VS:  HT:    WT:   BMI:     BP:121/82  HR:(!) 101bpm  TEMP: ( )  RESP:  Physical Exam Vitals and nursing note reviewed.  Constitutional:      General: She is not in acute distress.    Appearance: Normal appearance. She is not ill-appearing.  HENT:     Head: Normocephalic and atraumatic.     Right Ear: External ear normal.     Left Ear: External ear normal.  Eyes:     Extraocular Movements: Extraocular movements intact.  Cardiovascular:     Rate and Rhythm: Normal rate.     Pulses: Normal pulses.   Pulmonary:     Effort: Pulmonary effort is normal. No respiratory distress.  Abdominal:     General: There is no distension.     Palpations: Abdomen is soft.  Musculoskeletal:        General: Tenderness present.     Cervical back: Neck supple.     Right lower leg: No edema.     Left lower leg: No edema.     Comments: Patient has good distal strength with no pain over the greater trochanters.  No clonus or focal weakness.  Skin:    Findings: No erythema, lesion or rash.  Neurological:     General: No focal deficit present.     Mental Status: She is alert and oriented to person, place, and time.     Sensory: No sensory deficit.     Motor: No weakness or abnormal muscle tone.     Coordination: Coordination normal.  Psychiatric:        Mood and Affect: Mood normal.        Behavior: Behavior normal.      Imaging: No results found.

## 2023-06-04 ENCOUNTER — Other Ambulatory Visit: Payer: Self-pay

## 2023-06-05 ENCOUNTER — Encounter: Payer: Self-pay | Admitting: Internal Medicine

## 2023-06-05 ENCOUNTER — Telehealth: Payer: Self-pay | Admitting: *Deleted

## 2023-06-05 ENCOUNTER — Telehealth: Payer: Self-pay

## 2023-06-05 ENCOUNTER — Other Ambulatory Visit: Payer: Self-pay

## 2023-06-05 ENCOUNTER — Ambulatory Visit: Attending: Internal Medicine | Admitting: Internal Medicine

## 2023-06-05 VITALS — BP 113/78 | HR 93 | Ht 67.0 in | Wt 156.8 lb

## 2023-06-05 DIAGNOSIS — R7303 Prediabetes: Secondary | ICD-10-CM | POA: Diagnosis not present

## 2023-06-05 DIAGNOSIS — Z8639 Personal history of other endocrine, nutritional and metabolic disease: Secondary | ICD-10-CM

## 2023-06-05 DIAGNOSIS — Z7985 Long-term (current) use of injectable non-insulin antidiabetic drugs: Secondary | ICD-10-CM | POA: Diagnosis not present

## 2023-06-05 DIAGNOSIS — K649 Unspecified hemorrhoids: Secondary | ICD-10-CM

## 2023-06-05 DIAGNOSIS — F250 Schizoaffective disorder, bipolar type: Secondary | ICD-10-CM

## 2023-06-05 DIAGNOSIS — Z8742 Personal history of other diseases of the female genital tract: Secondary | ICD-10-CM

## 2023-06-05 MED ORDER — TIRZEPATIDE 2.5 MG/0.5ML ~~LOC~~ SOAJ
2.5000 mg | SUBCUTANEOUS | 0 refills | Status: DC
  Filled 2023-06-05 – 2023-06-08 (×2): qty 2, 28d supply, fill #0

## 2023-06-05 NOTE — Progress Notes (Signed)
 Complex Care Management Note  Care Guide Note 06/05/2023 Name: MCKELL RIECKE MRN: [id number] DOB: 12/10/1980  Rodell Citrin is a 43 y.o. year old female who sees Lawrance Presume, MD for primary care. I reached out to Rodell Citrin by phone today to offer complex care management services.  Ms. Balcerzak was given information about Complex Care Management services today including:   The Complex Care Management services include support from the care team which includes your Nurse Care Manager, Clinical Social Worker, or Pharmacist.  The Complex Care Management team is here to help remove barriers to the health concerns and goals most important to you. Complex Care Management services are voluntary, and the patient may decline or stop services at any time by request to their care team member.   Complex Care Management Consent Status: Patient agreed to services and verbal consent obtained.   Follow up plan:  Telephone appointment with complex care management team member scheduled for:  06/08/23  Encounter Outcome:  Patient Scheduled  Barnie Bora  [facility]  [facility], [facility] Guide  Direct Dial: [phone]  Fax [phone]

## 2023-06-05 NOTE — Telephone Encounter (Signed)
 Pt LVM in triage line requesting a referral to [facility] for her PCOS.

## 2023-06-05 NOTE — Telephone Encounter (Signed)
 Pharmacy Patient Advocate Encounter   Received notification from CoverMyMeds that prior authorization for MOUNJARO is required/requested.   Insurance verification completed.   The patient is insured through [employer] .   Per test claim: PA required; PA submitted to above mentioned insurance via CoverMyMeds Key/confirmation #/EOC [id number] Status is pending

## 2023-06-05 NOTE — Progress Notes (Signed)
 Patient ID: Sarah Evans, female    DOB: 1980-05-11  MRN: [id number]  CC: Medical Management of Chronic Issues (No concerns)   Subjective: Sarah Evans is a 43 y.o. female who presents for chronic ds management. Her concerns today include:  Patient with history of prediabetes on Ozempic , HL, PCOS, GERD, obesity, chronic thoracic spine/neck pain BL hearing loss, OSA, schizoaffective bipolar type/PTSD/MDD/GAD, OA knees   Discussed the use of AI scribe software for clinical note transcription with the patient, who gave verbal consent to proceed.  History of Present Illness Sarah Evans is a 43 year old female with PCOS and prediabetes who presents for a routine follow-up visit to discuss weight management.  She has gained approximately 16 pounds over the last couple of months, reaching a plateau in her weight loss journey. She is on Ozempic  2 mg but finds it ineffective in controlling her appetite, which has increased since December. Initially weighing 202 pounds, she had lost 57 pounds before the recent gain. She is interested in trying Mounjaro to help suppress her appetite and manage insulin  levels due to PCOS and insulin  resistance.  She engages in physical activities such as upper body exercises, stretching, yoga, and meditation. She meditates daily and practices yoga two to three times a week. Knee problems limit her ability to perform heavy exercises.  She is scheduled for hemorrhoid surgery on June 6 and anticipates needing assistance post-surgery for personal care tasks for about 1 mth.  She is requesting PCS services.  She reports being told that she will be in a lot of pain post the procedure and will need to limit her movement.  States she will also need to take soaking baths about 5-6 times a day.  She tells me she will need help with getting back on fourth to the bathroom, getting in the tub and with light housekeeping like taking the trash out.  She lacks family support and may  need to hire help or manage on her own.  She has schizoaffective disorder, anxiety, and depression, for which she sees a therapist weekly and a psychiatrist every three months. She is on Seroquel , taking 300 mg at night and an additional 50 mg as needed during the day. Her dose was increased from 200 mg to 300 mg a few months ago.  She feels she is doing well on her medications.    Patient Active Problem List   Diagnosis Date Noted   Well woman exam with routine gynecological exam 11/18/2022   Vision changes 08/16/2022   Bilateral hearing loss 08/16/2022   Chronic pain of both knees 07/24/2021   Sleep apnea 06/20/2021   Cholelithiasis 06/20/2021   History of PCOS 05/30/2021   Hirsutism 05/30/2021   Chronic neck pain 11/01/2020   Chronic thoracic spine pain 10/25/2020   GERD without esophagitis 05/27/2020   Numbness and tingling 05/24/2020   History of stroke 05/23/2020   BMI 29.0-29.9,adult 05/21/2020   Herpes simplex vulvovaginitis 05/21/2020   Prediabetes 04/12/2020   Intrinsic eczema 04/12/2020   Mixed hyperlipidemia 04/12/2020   Muscle spasticity 04/12/2020   Moderate episode of recurrent major depressive disorder (HCC) 06/09/2019   Generalized anxiety disorder 06/09/2019   Personal history of nonsuicidal self-harm 04/28/2018   PTSD (post-traumatic stress disorder) 04/30/2015   Schizoaffective disorder, bipolar type (HCC) 04/27/2015     Current Outpatient Medications on File Prior to Visit  Medication Sig Dispense Refill   acetaminophen  (TYLENOL ) 500 MG tablet Take 1,000 mg by mouth every  6 (six) hours as needed for moderate pain.     atorvastatin  (LIPITOR) 10 MG tablet Take 1 tablet (10 mg total) by mouth daily. 90 tablet 3   diazepam  (VALIUM ) 5 MG tablet Take one tablet by mouth with light food one hour prior to procedure. 1 tablet 0   ketoconazole  (NIZORAL ) 2 % shampoo Apply topically 2 (two) times a week. APPLY TOPICALLY 2 TIMES A WEEK 120 mL 3   minoxidil (LONITEN)  2.5 MG tablet Take 5 mg by mouth daily.     pantoprazole  (PROTONIX ) 20 MG tablet Take 1 tablet (20 mg total) by mouth daily. 30 tablet 0   prazosin  (MINIPRESS ) 2 MG capsule Take 1 capsule (2 mg total) by mouth at bedtime. (Patient taking differently: Take 2 mg by mouth at bedtime as needed.) 30 capsule 3   Prenatal Vit-Fe Fumarate-FA (PRENATAL MULTIVITAMIN) TABS tablet Take 1 tablet by mouth every evening.     QUEtiapine  (SEROQUEL  XR) 300 MG 24 hr tablet Take 1 tablet (300 mg total) by mouth at bedtime. 30 tablet 3   QUEtiapine  (SEROQUEL ) 50 MG tablet Take 1 tablet (50 mg total) by mouth 2 (two) times daily. 60 tablet 1   Semaglutide , 2 MG/DOSE, 8 MG/3ML SOPN Inject 2 mg as directed once a week. 3 mL 2   tiZANidine  (ZANAFLEX ) 4 MG tablet Take 1 tablet (4 mg total) by mouth every 8 (eight) hours as needed for muscle spasms. 60 tablet 1   valACYclovir  (VALTREX ) 1000 MG tablet Take 1 tablet (1,000 mg total) by mouth once daily. (Patient taking differently: Take 1,000 mg by mouth daily as needed.) 60 tablet 2   zolpidem  (AMBIEN ) 5 MG tablet Take 1 tablet (5 mg total) by mouth at bedtime as needed for sleep. 15 tablet 1   No current facility-administered medications on file prior to visit.    No Known Allergies  Social History   Socioeconomic History   Marital status: Single    Spouse name: Not on file   Number of children: Not on file   Years of education: Not on file   Highest education level: Not on file  Occupational History   Not on file  Tobacco Use   Smoking status: Former    Current packs/day: 0.00    Average packs/day: 0.3 packs/day for 15.0 years (3.8 ttl pk-yrs)    Types: Cigarettes    Start date: 05/02/2005    Quit date: 05/02/2020    Years since quitting: 3.0   Smokeless tobacco: Never  Vaping Use   Vaping status: Never Used  Substance and Sexual Activity   Alcohol use: No   Drug use: No   Sexual activity: Not Currently    Birth control/protection: Abstinence  Other  Topics Concern   Not on file  Social History Narrative   Not on file   Social Drivers of Health   Financial Resource Strain: High Risk (12/19/2022)   Overall Financial Resource Strain (CARDIA)    Difficulty of Paying Living Expenses: Very hard  Food Insecurity: No Food Insecurity (12/19/2022)   Hunger Vital Sign    Worried About Running Out of Food in the Last Year: Never true    Ran Out of Food in the Last Year: Never true  Transportation Needs: No Transportation Needs (12/19/2022)   PRAPARE - Administrator, Civil Service (Medical): No    Lack of Transportation (Non-Medical): No  Physical Activity: Insufficiently Active (12/19/2022)   Exercise Vital Sign    Days  of Exercise per Week: 2 days    Minutes of Exercise per Session: 20 min  Stress: No Stress Concern Present (12/19/2022)   [employer] of Occupational Health - Occupational Stress Questionnaire    Feeling of Stress : Not at all  Social Connections: Socially Isolated (12/19/2022)   Social Connection and Isolation Panel [NHANES]    Frequency of Communication with Friends and Family: Once a week    Frequency of Social Gatherings with Friends and Family: Once a week    Attends Religious Services: Never    Database administrator or Organizations: No    Attends Evans Meetings: Never    Marital Status: Never married  Intimate Partner Violence: Not At Risk (12/19/2022)   Humiliation, Afraid, Rape, and Kick questionnaire    Fear of Current or Ex-Partner: No    Emotionally Abused: No    Physically Abused: No    Sexually Abused: No    Family History  Problem Relation Age of Onset   Bipolar disorder Mother    Diabetes Maternal Grandmother    Hypertension Neg Hx    Heart disease Neg Hx    Cancer Neg Hx     Past Surgical History:  Procedure Laterality Date   BUBBLE STUDY  05/25/2020   Procedure: BUBBLE STUDY;  Surgeon: Hazle Lites, MD;  Location: [facility]COPY;  Service:  Cardiovascular;;   NO PAST SURGERIES     TEE WITHOUT CARDIOVERSION N/A 05/25/2020   Procedure: TRANSESOPHAGEAL ECHOCARDIOGRAM (TEE);  Surgeon: Hazle Lites, MD;  Location[facility] ENDOSCOPY;  Service: Cardiovascular;  Laterality: N/A;    ROS: Review of Systems Negative except as stated above  PHYSICAL EXAM: BP 113/78   Pulse 93   Ht 5\' 7"  (1.702 m)   Wt 156 lb 12.8 oz (71.1 kg)   SpO2 100%   BMI 24.56 kg/m   Wt Readings from Last 3 Encounters:  06/05/23 156 lb 12.8 oz (71.1 kg)  04/06/23 150 lb 3.2 oz (68.1 kg)  03/07/23 145 lb (65.8 kg)    Physical Exam  General appearance - alert, well appearing, and in no distress Mental status - flat affect Chest - clear to auscultation, no wheezes, rales or rhonchi, symmetric air entry Heart - normal rate, regular rhythm, normal S1, S2, no murmurs, rubs, clicks or gallops Extremities - peripheral pulses normal, no pedal edema, no clubbing or cyanosis      Latest Ref Rng & Units 03/07/2023    9:58 PM 02/09/2023    4:23 PM 02/09/2023    4:22 PM  CMP  Glucose 70 - 99 mg/dL 79   78   BUN 6 - 20 mg/dL 12   7   Creatinine 8.41 - 1.00 mg/dL 3.24   4.01   Sodium 027 - 145 mmol/L 139   143   Potassium 3.5 - 5.1 mmol/L 3.3   4.4   Chloride 98 - 111 mmol/L 105   107   CO2 22 - 32 mmol/L 27   22   Calcium  8.9 - 10.3 mg/dL 9.0   9.2   Total Protein 6.0 - 8.5 g/dL  6.4  6.7   Total Bilirubin 0.0 - 1.2 mg/dL  0.4  0.4   Alkaline Phos 44 - 121 IU/L  79  82   AST 0 - 40 IU/L  21  24   ALT 0 - 32 IU/L  47  50    Lipid Panel     Component Value Date/Time  CHOL 188 02/09/2023 1620   TRIG 61 02/09/2023 1620   HDL 71 02/09/2023 1620   CHOLHDL 2.6 02/09/2023 1620   CHOLHDL 2.8 05/24/2020 0332   VLDL 28 05/24/2020 0332   LDLCALC 106 (H) 02/09/2023 1620    CBC    Component Value Date/Time   WBC 6.4 03/07/2023 2158   RBC 4.19 03/07/2023 2158   HGB 12.1 03/07/2023 2158   HGB 12.3 02/09/2023 1621   HCT 38.3 03/07/2023 2158   HCT 39.6  02/09/2023 1621   PLT 336 03/07/2023 2158   PLT 379 02/09/2023 1621   MCV 91.4 03/07/2023 2158   MCV 92 02/09/2023 1621   MCH 28.9 03/07/2023 2158   MCHC 31.6 03/07/2023 2158   RDW 13.2 03/07/2023 2158   RDW 13.1 02/09/2023 1621   LYMPHSABS 1.8 02/09/2023 1621   MONOABS 0.5 05/22/2021 1555   EOSABS 0.1 02/09/2023 1621   BASOSABS 0.1 02/09/2023 1621    ASSESSMENT AND PLAN: 1. History of obesity (Primary) Patient started at over 200 pounds and had gotten down to 145 pounds on Ozempic  2 mg.  However she has gained 11 pounds since December of last year.  She attributes this to increased appetite and would like to try Mounjaro instead.  Increased dose of Seroquel  could have played a role with recent weight gain. Discussed changing to Mounjaro.  I went over with pt how the medication works and potential side effects including nausea, vomiting, diarrhea/constipation, bowel blockage, palpitations and pancreatitis.  Advised to stop the medicine and be seen if pt develops any abdominal pain, vomiting, severe diarrhea/constipation or palpitations. Advised to stop the Ozempic  and can start Mounjaro.  We will start at the lowest dose of 2.5 mg once a week and will need to titrate monthly to a higher dose if she is tolerating the previous dose. - tirzepatide (MOUNJARO) 2.5 MG/0.5ML Pen; Inject 2.5 mg into the skin once a week. Stop Ozempic   Dispense: 2 mL; Refill: 0  2. Prediabetes See #1 above. Encouraged her to get in some form of moderate intensity exercise.  She reports she is limited due to issues with her knees.  Advised to consider water aerobics. - tirzepatide (MOUNJARO) 2.5 MG/0.5ML Pen; Inject 2.5 mg into the skin once a week. Stop Ozempic   Dispense: 2 mL; Refill: 0  3. Hemorrhoids, unspecified hemorrhoid type Advised patient I not sure that she meets qualifying criteria of 2 skilled needs to get PCS services post hemorrhoid surgery but I will have our case worker submit it  nonetheless. Advised to hold off on taking Mounjaro for at least 1 week prior to her surgery which is scheduled for the sixth of next month.  4. Schizoaffective disorder, bipolar type (HCC) Plugged in with behavioral health and feels that she is stable on medication.   Patient was given the opportunity to ask questions.  Patient verbalized understanding of the plan and was able to repeat key elements of the plan.   This documentation was completed using Paediatric nurse.  Any transcriptional errors are unintentional.  No orders of the defined types were placed in this encounter.    Requested Prescriptions    No prescriptions requested or ordered in this encounter    No follow-ups on file.  Concetta Dee, MD, FACP

## 2023-06-05 NOTE — Telephone Encounter (Signed)
 Referral placed. Patient notified by MyChart message.   Routing to [employer].   Encounter closed.

## 2023-06-05 NOTE — Patient Instructions (Signed)
 VISIT SUMMARY:  During your visit, we discussed your recent weight gain and challenges with appetite control despite being on Ozempic . We explored the option of switching to Mounjaro to help manage your weight, appetite, and insulin  levels due to PCOS and insulin  resistance. We also reviewed your upcoming hemorrhoid surgery and the need for post-surgery assistance. Additionally, we touched on your ongoing management of schizoaffective disorder, depression, and anxiety.  YOUR PLAN:  -OBESITY: Obesity is a condition characterized by excessive body fat. We will switch your medication to Mounjaro, starting at 2.5 mg and increasing the dose monthly, to help control your appetite and you reach your weigh goal. Please stop taking Ozempic  one week before your surgery on June 6. Consult with your pharmacist on how to administer Mounjaro. HOLD OFF on taking Mounjaro for 1 week prior to your hemorrhoid surgery.   -POLYCYSTIC OVARY SYNDROME (PCOS): PCOS is a hormonal disorder that can cause insulin  resistance. Mounjaro may help manage your insulin  levels and support weight management.  -PREDIABETES: Prediabetes is a condition where blood sugar levels are higher than normal but not high enough to be classified as diabetes. Your weight gain and Seroquel  use may worsen this condition, so managing your weight is crucial. Try to engage in some form of aerobic exercise 3-4 times a week for 30 minutes.  Consider water aerobic aerobics if knees are an issue  -SCHIZOAFFECTIVE DISORDER: Schizoaffective disorder is a mental health condition that includes symptoms of both schizophrenia and mood disorders. You are currently managing this with Seroquel  and regular visits to your therapist and psychiatrist.   -HEMORRHOIDS: Hemorrhoids are swollen veins in the lower rectum and anus. You are scheduled for surgery on June 6. We discussed the need for post-surgery assistance, and we will submit a request for PCS services to  Medicaid. If PCS services are not approved, please arrange for additional help.  INSTRUCTIONS:  Please stop taking Ozempic  one week before your surgery on June 6. We will submit a request for PCS services to [facility] for your post-surgery care. If PCS services are not approved, you will need to arrange for additional help. Consult with your pharmacist on how to administer Mounjaro.

## 2023-06-08 ENCOUNTER — Telehealth: Payer: Self-pay

## 2023-06-08 ENCOUNTER — Other Ambulatory Visit (HOSPITAL_COMMUNITY): Payer: Self-pay | Admitting: Psychiatry

## 2023-06-08 ENCOUNTER — Other Ambulatory Visit (HOSPITAL_COMMUNITY): Payer: Self-pay

## 2023-06-08 ENCOUNTER — Other Ambulatory Visit: Payer: Self-pay

## 2023-06-08 DIAGNOSIS — F250 Schizoaffective disorder, bipolar type: Secondary | ICD-10-CM

## 2023-06-08 DIAGNOSIS — F5105 Insomnia due to other mental disorder: Secondary | ICD-10-CM

## 2023-06-09 ENCOUNTER — Telehealth: Payer: Self-pay

## 2023-06-09 ENCOUNTER — Other Ambulatory Visit: Payer: Self-pay

## 2023-06-09 ENCOUNTER — Other Ambulatory Visit: Payer: Self-pay | Admitting: Nurse Practitioner

## 2023-06-09 DIAGNOSIS — R7303 Prediabetes: Secondary | ICD-10-CM

## 2023-06-09 MED ORDER — SEMAGLUTIDE (2 MG/DOSE) 8 MG/3ML ~~LOC~~ SOPN
2.0000 mg | PEN_INJECTOR | SUBCUTANEOUS | 2 refills | Status: DC
Start: 2023-06-09 — End: 2023-09-09
  Filled 2023-06-09 – 2023-06-25 (×3): qty 3, 28d supply, fill #0
  Filled 2023-07-20: qty 6, 56d supply, fill #1

## 2023-06-09 NOTE — Telephone Encounter (Signed)
 PCS request efaxed to [place] [facility] as patient is requesting help with ADLs after her hemorrhoid surgery scheduled for 06/12/2023.

## 2023-06-10 ENCOUNTER — Other Ambulatory Visit: Payer: Self-pay

## 2023-06-10 ENCOUNTER — Telehealth: Payer: Self-pay

## 2023-06-10 ENCOUNTER — Telehealth (HOSPITAL_COMMUNITY): Admitting: Psychiatry

## 2023-06-10 NOTE — Telephone Encounter (Signed)
 Thank you Travia. I have no additional recommendations

## 2023-06-10 NOTE — Telephone Encounter (Signed)
 Copied from CRM [id number]. Topic: Clinical - Medication Question >> Jun 10, 2023  9:35 AM Lotus Round B wrote: Reason for CRM: pt is calling in to see why type of diabetes she has because her insurance no longer will cover the [employer]  unless she has type 2 diabetes which she thinks she doesn't have so she would like to see if there is any insulin  medication that controls her A1C even with her having PCOS . Pt would like a call to see if someone can give her a call about this .

## 2023-06-10 NOTE — Telephone Encounter (Signed)
 Patient was called back. Name & DOB verified.   Informed that PCP was not in the office. Advised  patient that she has prediabetes.  Informed that GYN had referred her to Endo on 06/05/2023 to manage PCOS metobolic and hormonal d/o.   Informed she may get a call from the office the referral was sent to w/I 2 weeks.  She has looked up the phone number to call Endo to check on appointment.   Advised to call office for additional concerns.

## 2023-06-11 ENCOUNTER — Other Ambulatory Visit: Payer: Self-pay

## 2023-06-11 NOTE — Patient Instructions (Signed)
 Visit Information  Thank you for taking time to visit with me today. Please don't hesitate to contact me if I can be of assistance to you before our next scheduled appointment.   Following is a copy of your care plan:   Goals Addressed   None      Dallis Dues, BSW [place]  [facility], [facility] Social Worker Direct Dial: [phone]  Fax: [phone] Website: [email or web address]

## 2023-06-11 NOTE — Patient Outreach (Signed)
 Complex Care Management   Visit Note  06/11/2023  Name:  Sarah Evans MRN: [id number] DOB: 01-03-1981  Situation: Referral received for Complex Care Management related to care prior and post surgery I obtained verbal consent from Patient.  Visit completed with patient  on the phone  Background:   Past Medical History:  Diagnosis Date   Bipolar 1 disorder (HCC)    Gallstone    Herpes simplex virus (HSV) infection    dx 2001   Polycystic ovary disease    PTSD (post-traumatic stress disorder)    Schizophrenia (HCC)    Stroke [facility])     Assessment:  Patient reports she has someone to assist while she has surgery and afterward.  Patient declines assessment and reports services are not needed.  No assessment completed.   Recommendation:   None  Follow Up Plan:   Patient states services are not needed.  Dallis Dues, BSW [place]  [facility], [facility] Social Worker Direct Dial: [phone]  Fax: [phone] Website: [email or web address]

## 2023-06-12 ENCOUNTER — Other Ambulatory Visit: Payer: Self-pay | Admitting: Surgery

## 2023-06-12 DIAGNOSIS — K643 Fourth degree hemorrhoids: Secondary | ICD-10-CM | POA: Diagnosis not present

## 2023-06-12 DIAGNOSIS — K642 Third degree hemorrhoids: Secondary | ICD-10-CM | POA: Diagnosis not present

## 2023-06-12 DIAGNOSIS — K644 Residual hemorrhoidal skin tags: Secondary | ICD-10-CM | POA: Diagnosis not present

## 2023-06-12 DIAGNOSIS — K6282 Dysplasia of anus: Secondary | ICD-10-CM | POA: Diagnosis not present

## 2023-06-16 LAB — SURGICAL PATHOLOGY

## 2023-06-17 ENCOUNTER — Other Ambulatory Visit (HOSPITAL_COMMUNITY): Payer: Self-pay | Admitting: Psychiatry

## 2023-06-17 ENCOUNTER — Other Ambulatory Visit: Payer: Self-pay

## 2023-06-17 DIAGNOSIS — F5105 Insomnia due to other mental disorder: Secondary | ICD-10-CM

## 2023-06-18 ENCOUNTER — Other Ambulatory Visit (HOSPITAL_COMMUNITY): Payer: Self-pay | Admitting: Psychiatry

## 2023-06-18 ENCOUNTER — Other Ambulatory Visit: Payer: Self-pay

## 2023-06-18 DIAGNOSIS — F5105 Insomnia due to other mental disorder: Secondary | ICD-10-CM

## 2023-06-18 MED ORDER — ZOLPIDEM TARTRATE 5 MG PO TABS
5.0000 mg | ORAL_TABLET | Freq: Every evening | ORAL | 1 refills | Status: DC | PRN
Start: 2023-06-18 — End: 2023-11-20
  Filled 2023-06-18: qty 15, 15d supply, fill #0

## 2023-06-18 NOTE — Telephone Encounter (Signed)
 I have been reducing it. I will send a quantity of 15 to her preferred pharmacy.

## 2023-06-18 NOTE — Telephone Encounter (Signed)
 Patient now sees Dr. Arfeen. Please have him address this. Her does was reduced at her last visit with Clinical research associate.

## 2023-06-22 ENCOUNTER — Other Ambulatory Visit: Payer: Self-pay

## 2023-06-25 ENCOUNTER — Encounter: Payer: Self-pay | Admitting: Internal Medicine

## 2023-06-25 ENCOUNTER — Other Ambulatory Visit: Payer: Self-pay

## 2023-06-26 ENCOUNTER — Other Ambulatory Visit: Payer: Self-pay

## 2023-06-29 ENCOUNTER — Other Ambulatory Visit: Payer: Self-pay

## 2023-06-30 ENCOUNTER — Telehealth: Admitting: Internal Medicine

## 2023-06-30 DIAGNOSIS — G8929 Other chronic pain: Secondary | ICD-10-CM | POA: Diagnosis not present

## 2023-06-30 DIAGNOSIS — M542 Cervicalgia: Secondary | ICD-10-CM | POA: Diagnosis not present

## 2023-06-30 DIAGNOSIS — Z8673 Personal history of transient ischemic attack (TIA), and cerebral infarction without residual deficits: Secondary | ICD-10-CM | POA: Diagnosis not present

## 2023-06-30 DIAGNOSIS — R569 Unspecified convulsions: Secondary | ICD-10-CM | POA: Diagnosis not present

## 2023-07-01 ENCOUNTER — Other Ambulatory Visit: Payer: Self-pay

## 2023-07-04 ENCOUNTER — Other Ambulatory Visit (HOSPITAL_COMMUNITY): Payer: Self-pay | Admitting: Psychiatry

## 2023-07-04 DIAGNOSIS — F250 Schizoaffective disorder, bipolar type: Secondary | ICD-10-CM

## 2023-07-05 ENCOUNTER — Other Ambulatory Visit: Payer: Self-pay | Admitting: Internal Medicine

## 2023-07-06 ENCOUNTER — Other Ambulatory Visit: Payer: Self-pay

## 2023-07-07 ENCOUNTER — Other Ambulatory Visit: Payer: Self-pay

## 2023-07-07 ENCOUNTER — Other Ambulatory Visit (HOSPITAL_COMMUNITY): Payer: Self-pay | Admitting: Psychiatry

## 2023-07-07 DIAGNOSIS — F250 Schizoaffective disorder, bipolar type: Secondary | ICD-10-CM

## 2023-07-07 DIAGNOSIS — F5105 Insomnia due to other mental disorder: Secondary | ICD-10-CM

## 2023-07-07 NOTE — Telephone Encounter (Signed)
 Requested medications are due for refill today.  yes  Requested medications are on the active medications list.  yes  Last refill. 04/06/2023 #60 1 rf  Future visit scheduled.   no  Notes to clinic.  Refill not delegated.    Requested Prescriptions  Pending Prescriptions Disp Refills   tiZANidine  (ZANAFLEX ) 4 MG tablet [Pharmacy Med Name: TIZANIDINE  4MG  TABLETS] 30 tablet     Sig: TAKE 1 TABLET(4 MG) BY MOUTH EVERY 6 HOURS AS NEEDED FOR MUSCLE SPASMS     Not Delegated - Cardiovascular:  Alpha-2 Agonists - tizanidine  Failed - 07/07/2023  1:59 PM      Failed - This refill cannot be delegated      Passed - Valid encounter within last 6 months    Recent Outpatient Visits           1 month ago History of obesity   [facility] [place]. [facility] Vicci Barnie NOVAK, MD   3 months ago Occipital headache   [facility] [place]. [facility] Delbert Clam, MD   4 months ago Elevated liver function tests   [facility] [place]. [facility] Vicci Barnie NOVAK, MD   6 months ago Prediabetes   [facility] [place]. [facility] Vicci Barnie NOVAK, MD   10 months ago Prediabetes   [facility] Of . [facility] Brien Belvie BRAVO, MD       Future Appointments             In 2 months Dartha Ernst, MD [facility] End[facility]

## 2023-07-09 ENCOUNTER — Other Ambulatory Visit: Payer: Self-pay

## 2023-07-11 ENCOUNTER — Telehealth: Admitting: Nurse Practitioner

## 2023-07-11 DIAGNOSIS — R399 Unspecified symptoms and signs involving the genitourinary system: Secondary | ICD-10-CM | POA: Diagnosis not present

## 2023-07-11 MED ORDER — NITROFURANTOIN MONOHYD MACRO 100 MG PO CAPS: 100.0000 mg | ORAL_CAPSULE | Freq: Two times a day (BID) | ORAL | 0 refills | Status: AC

## 2023-07-11 NOTE — Patient Instructions (Signed)
 Sarah Evans, thank you for joining Haze LELON Servant, NP for today's virtual visit.  While this provider is not your primary care provider (PCP), if your PCP is located in our provider database this encounter information will be shared with them immediately following your visit.   A [facility] MyChart account gives you access to today's visit and all your visits, tests, and labs performed at [facility]  click here if you don't have a [facility] MyChart account or go to [email or web address]  Consent: (Patient) Sarah Evans provided verbal consent for this virtual visit at the beginning of the encounter.  Current Medications:  Current Outpatient Medications:    nitrofurantoin , macrocrystal-monohydrate, (MACROBID ) 100 MG capsule, Take 1 capsule (100 mg total) by mouth 2 (two) times daily for 5 days., Disp: 10 capsule, Rfl: 0   acetaminophen  (TYLENOL ) 500 MG tablet, Take 1,000 mg by mouth every 6 (six) hours as needed for moderate pain., Disp: , Rfl:    atorvastatin  (LIPITOR) 10 MG tablet, Take 1 tablet (10 mg total) by mouth daily., Disp: 90 tablet, Rfl: 3   diazepam  (VALIUM ) 5 MG tablet, Take one tablet by mouth with light food one hour prior to procedure., Disp: 1 tablet, Rfl: 0   ketoconazole  (NIZORAL ) 2 % shampoo, Apply topically 2 (two) times a week. APPLY TOPICALLY 2 TIMES A WEEK, Disp: 120 mL, Rfl: 3   minoxidil (LONITEN) 2.5 MG tablet, Take 5 mg by mouth daily., Disp: , Rfl:    pantoprazole  (PROTONIX ) 20 MG tablet, Take 1 tablet (20 mg total) by mouth daily., Disp: 30 tablet, Rfl: 0   prazosin  (MINIPRESS ) 2 MG capsule, Take 1 capsule (2 mg total) by mouth at bedtime. (Patient taking differently: Take 2 mg by mouth at bedtime as needed.), Disp: 30 capsule, Rfl: 3   Prenatal Vit-Fe Fumarate-FA (PRENATAL MULTIVITAMIN) TABS tablet, Take 1 tablet by mouth every evening., Disp: , Rfl:    QUEtiapine  (SEROQUEL  XR) 300 MG 24 hr tablet, Take 1 tablet (300 mg total) by mouth at  bedtime., Disp: 30 tablet, Rfl: 3   QUEtiapine  (SEROQUEL ) 50 MG tablet, Take 1 tablet (50 mg total) by mouth 2 (two) times daily., Disp: 60 tablet, Rfl: 1   Semaglutide , 2 MG/DOSE, 8 MG/3ML SOPN, Inject 2 mg as directed once a week., Disp: 3 mL, Rfl: 2   tiZANidine  (ZANAFLEX ) 4 MG tablet, TAKE 1 TABLET(4 MG) BY MOUTH EVERY 6 HOURS AS NEEDED FOR MUSCLE SPASMS, Disp: 30 tablet, Rfl: 1   valACYclovir  (VALTREX ) 1000 MG tablet, Take 1 tablet (1,000 mg total) by mouth once daily. (Patient taking differently: Take 1,000 mg by mouth daily as needed.), Disp: 60 tablet, Rfl: 2   zolpidem  (AMBIEN ) 5 MG tablet, Take 1 tablet (5 mg total) by mouth at bedtime as needed for sleep., Disp: 15 tablet, Rfl: 1   Medications ordered in this encounter:  Meds ordered this encounter  Medications   nitrofurantoin , macrocrystal-monohydrate, (MACROBID ) 100 MG capsule    Sig: Take 1 capsule (100 mg total) by mouth 2 (two) times daily for 5 days.    Dispense:  10 capsule    Refill:  0    Supervising Provider:   BLAISE ALEENE KIDD [[id number]]     *If you need refills on other medications prior to your next appointment, please contact your pharmacy*  Follow-Up: Call back or seek an in-person evaluation if the symptoms worsen or if the condition fails to improve as anticipated.  [facility] [phone]  If you have been instructed to have an in-person evaluation today at a local Urgent Care facility, please use the link below. It will take you to a list of all of our available [facility], including address, phone number and hours of operation. Please do not delay care.  [facility]  If you or a family member do not have a primary care provider, use the link below to schedule a visit and establish care. When you choose a [facility] primary care physician or advanced practice provider, you gain a long-term partner in health. Find a Primary Care Provider  Learn more about [facility]'s in-office and virtual care options: [facility] - Get Care Now

## 2023-07-11 NOTE — Progress Notes (Signed)
 Virtual Visit Consent   Sarah Evans, you are scheduled for a virtual visit with a [facility] provider today. Just as with appointments in the office, your consent must be obtained to participate. Your consent will be active for this visit and any virtual visit you may have with one of our providers in the next 365 days. If you have a MyChart account, a copy of this consent can be sent to you electronically.  As this is a virtual visit, video technology does not allow for your provider to perform a traditional examination. This may limit your provider's ability to fully assess your condition. If your provider identifies any concerns that need to be evaluated in person or the need to arrange testing (such as labs, EKG, etc.), we will make arrangements to do so. Although advances in technology are sophisticated, we cannot ensure that it will always work on either your end or our end. If the connection with a video visit is poor, the visit may have to be switched to a telephone visit. With either a video or telephone visit, we are not always able to ensure that we have a secure connection.  By engaging in this virtual visit, you consent to the provision of healthcare and authorize for your insurance to be billed (if applicable) for the services provided during this visit. Depending on your insurance coverage, you may receive a charge related to this service.  I need to obtain your verbal consent now. Are you willing to proceed with your visit today? Sarah Evans has provided verbal consent on 07/11/2023 for a virtual visit (video or telephone). Haze LELON Servant, NP  Date: 07/11/2023 1:24 PM   Virtual Visit via Video Note   I, Haze LELON Servant, connected with  Sarah Evans  ([id number], [birth year]) on 07/11/23 at  1:15 PM EDT by a video-enabled telemedicine application and verified that I am speaking with the correct person using two identifiers.  Location: Patient: Virtual Visit Location Patient:  Home Provider: Virtual Visit Location Provider: Home Office   I discussed the limitations of evaluation and management by telemedicine and the availability of in person appointments. The patient expressed understanding and agreed to proceed.    History of Present Illness: Sarah Evans is a 43 y.o. who identifies as a female who was assigned female at birth, and is being seen today for UTI symptoms.  Ms. Taher states she has been experiencing bladder pain and malodorous urine over the past several weeks.  She states she was treated for UTI by her gynecologist with an antibiotic in the past that cleared her symptoms however she cannot recall the name of this medication.  She denies any fever nausea vomiting, hematuria or flank pain.    Problems:  Patient Active Problem List   Diagnosis Date Noted   Well woman exam with routine gynecological exam 11/18/2022   Vision changes 08/16/2022   Bilateral hearing loss 08/16/2022   Chronic pain of both knees 07/24/2021   Sleep apnea 06/20/2021   Cholelithiasis 06/20/2021   History of PCOS 05/30/2021   Hirsutism 05/30/2021   Chronic neck pain 11/01/2020   Chronic thoracic spine pain 10/25/2020   GERD without esophagitis 05/27/2020   Numbness and tingling 05/24/2020   History of stroke 05/23/2020   BMI 29.0-29.9,adult 05/21/2020   Herpes simplex vulvovaginitis 05/21/2020   Prediabetes 04/12/2020   Intrinsic eczema 04/12/2020   Mixed hyperlipidemia 04/12/2020   Muscle spasticity 04/12/2020   Moderate episode of recurrent major  depressive disorder (HCC) 06/09/2019   Generalized anxiety disorder 06/09/2019   Personal history of nonsuicidal self-harm 04/28/2018   PTSD (post-traumatic stress disorder) 04/30/2015   Schizoaffective disorder, bipolar type (HCC) 04/27/2015    Allergies: No Known Allergies Medications:  Current Outpatient Medications:    nitrofurantoin , macrocrystal-monohydrate, (MACROBID ) 100 MG capsule, Take 1 capsule (100 mg  total) by mouth 2 (two) times daily for 5 days., Disp: 10 capsule, Rfl: 0   acetaminophen  (TYLENOL ) 500 MG tablet, Take 1,000 mg by mouth every 6 (six) hours as needed for moderate pain., Disp: , Rfl:    atorvastatin  (LIPITOR) 10 MG tablet, Take 1 tablet (10 mg total) by mouth daily., Disp: 90 tablet, Rfl: 3   diazepam  (VALIUM ) 5 MG tablet, Take one tablet by mouth with light food one hour prior to procedure., Disp: 1 tablet, Rfl: 0   ketoconazole  (NIZORAL ) 2 % shampoo, Apply topically 2 (two) times a week. APPLY TOPICALLY 2 TIMES A WEEK, Disp: 120 mL, Rfl: 3   minoxidil (LONITEN) 2.5 MG tablet, Take 5 mg by mouth daily., Disp: , Rfl:    pantoprazole  (PROTONIX ) 20 MG tablet, Take 1 tablet (20 mg total) by mouth daily., Disp: 30 tablet, Rfl: 0   prazosin  (MINIPRESS ) 2 MG capsule, Take 1 capsule (2 mg total) by mouth at bedtime. (Patient taking differently: Take 2 mg by mouth at bedtime as needed.), Disp: 30 capsule, Rfl: 3   Prenatal Vit-Fe Fumarate-FA (PRENATAL MULTIVITAMIN) TABS tablet, Take 1 tablet by mouth every evening., Disp: , Rfl:    QUEtiapine  (SEROQUEL  XR) 300 MG 24 hr tablet, Take 1 tablet (300 mg total) by mouth at bedtime., Disp: 30 tablet, Rfl: 3   QUEtiapine  (SEROQUEL ) 50 MG tablet, Take 1 tablet (50 mg total) by mouth 2 (two) times daily., Disp: 60 tablet, Rfl: 1   Semaglutide , 2 MG/DOSE, 8 MG/3ML SOPN, Inject 2 mg as directed once a week., Disp: 3 mL, Rfl: 2   tiZANidine  (ZANAFLEX ) 4 MG tablet, TAKE 1 TABLET(4 MG) BY MOUTH EVERY 6 HOURS AS NEEDED FOR MUSCLE SPASMS, Disp: 30 tablet, Rfl: 1   valACYclovir  (VALTREX ) 1000 MG tablet, Take 1 tablet (1,000 mg total) by mouth once daily. (Patient taking differently: Take 1,000 mg by mouth daily as needed.), Disp: 60 tablet, Rfl: 2   zolpidem  (AMBIEN ) 5 MG tablet, Take 1 tablet (5 mg total) by mouth at bedtime as needed for sleep., Disp: 15 tablet, Rfl: 1  Observations/Objective: Patient is well-developed, well-nourished in no acute  distress.  Resting comfortably at home.  Head is normocephalic, atraumatic.  No labored breathing.  Speech is clear and coherent with logical content.  Patient is alert and oriented at baseline.    Assessment and Plan: 1. UTI symptoms (Primary) - nitrofurantoin , macrocrystal-monohydrate, (MACROBID ) 100 MG capsule; Take 1 capsule (100 mg total) by mouth 2 (two) times daily for 5 days.  Dispense: 10 capsule; Refill: 0   Follow Up Instructions: I discussed the assessment and treatment plan with the patient. The patient was provided an opportunity to ask questions and all were answered. The patient agreed with the plan and demonstrated an understanding of the instructions.  A copy of instructions were sent to the patient via MyChart unless otherwise noted below.    The patient was advised to call back or seek an in-person evaluation if the symptoms worsen or if the condition fails to improve as anticipated.    Saya Mccoll W Jaydee Ingman, NP

## 2023-07-13 ENCOUNTER — Other Ambulatory Visit: Payer: Self-pay

## 2023-07-13 ENCOUNTER — Encounter (HOSPITAL_COMMUNITY): Payer: Self-pay | Admitting: Psychiatry

## 2023-07-13 ENCOUNTER — Telehealth (HOSPITAL_COMMUNITY): Admitting: Psychiatry

## 2023-07-13 ENCOUNTER — Telehealth: Payer: Self-pay | Admitting: Internal Medicine

## 2023-07-13 VITALS — Wt 153.0 lb

## 2023-07-13 DIAGNOSIS — F250 Schizoaffective disorder, bipolar type: Secondary | ICD-10-CM

## 2023-07-13 DIAGNOSIS — F99 Mental disorder, not otherwise specified: Secondary | ICD-10-CM

## 2023-07-13 DIAGNOSIS — F4310 Post-traumatic stress disorder, unspecified: Secondary | ICD-10-CM | POA: Diagnosis not present

## 2023-07-13 DIAGNOSIS — F5105 Insomnia due to other mental disorder: Secondary | ICD-10-CM | POA: Diagnosis not present

## 2023-07-13 MED ORDER — QUETIAPINE FUMARATE ER 300 MG PO TB24
300.0000 mg | ORAL_TABLET | Freq: Every day | ORAL | 2 refills | Status: DC
Start: 2023-07-13 — End: 2023-10-06
  Filled 2023-07-13 (×2): qty 30, 30d supply, fill #0
  Filled 2023-08-07: qty 30, 30d supply, fill #1
  Filled 2023-09-06: qty 30, 30d supply, fill #2

## 2023-07-13 MED ORDER — PRAZOSIN HCL 2 MG PO CAPS
2.0000 mg | ORAL_CAPSULE | Freq: Every evening | ORAL | 0 refills | Status: DC | PRN
Start: 2023-07-13 — End: 2023-10-06
  Filled 2023-07-13: qty 30, 30d supply, fill #0

## 2023-07-13 MED ORDER — QUETIAPINE FUMARATE 50 MG PO TABS
50.0000 mg | ORAL_TABLET | Freq: Two times a day (BID) | ORAL | 2 refills | Status: DC
  Filled 2023-07-13: qty 60, 30d supply, fill #0
  Filled 2023-08-07: qty 60, 30d supply, fill #1
  Filled 2023-09-06: qty 60, 30d supply, fill #2

## 2023-07-13 NOTE — Telephone Encounter (Signed)
 She needs to speak with general surgeon and if pain unrelated to surgery she needs to follow up with her PCP in office.

## 2023-07-13 NOTE — Telephone Encounter (Signed)
 Copied from CRM [id number]. Topic: Clinical - Medical Advice >> Jul 13, 2023 10:56 AM DeAngela L wrote:  Reason for CRM: pt was prescribed medication by provider Theotis on Saturday and the patient states she is still having the pain but no smell and she is still having pain when she is urinate, the patient say her pain started when she had Hemorid ectomy 30 days ago, patient has 2 days left on the prescription to take but no relief Pt num [phone] (M)
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
## 2023-07-14 ENCOUNTER — Ambulatory Visit: Attending: Internal Medicine | Admitting: Internal Medicine

## 2023-07-14 ENCOUNTER — Encounter: Payer: Self-pay | Admitting: Internal Medicine

## 2023-07-14 ENCOUNTER — Other Ambulatory Visit: Payer: Self-pay

## 2023-07-14 VITALS — BP 106/72 | HR 112 | Temp 99.3°F | Ht 67.0 in | Wt 156.0 lb

## 2023-07-14 DIAGNOSIS — R300 Dysuria: Secondary | ICD-10-CM | POA: Diagnosis not present

## 2023-07-14 MED ORDER — CIPROFLOXACIN HCL 500 MG PO TABS
500.0000 mg | ORAL_TABLET | Freq: Two times a day (BID) | ORAL | 0 refills | Status: AC
  Filled 2023-07-14: qty 6, 3d supply, fill #0

## 2023-07-14 NOTE — Telephone Encounter (Signed)
 Caled & spoke to the patient. Verified name & DOB. Patient stated that she spoke to the surgeon's office nurse who advised patient see PCP. Appointment scheduled for today 07/14/2023 at 3:10 p.m. All concerns will be addressed at that time.

## 2023-07-14 NOTE — Progress Notes (Unsigned)
 Patient ID: Sarah Evans, female    DOB: 1980-07-14  MRN: [id number]  CC: Bladder pain (Bladder pain, burning / sharp when urinating, pain worsening in the last 2 weeks. /)   Subjective: Sarah Evans is a 43 y.o. female who presents for chronic ds management. Her concerns today include:  Patient with history of prediabetes on Ozempic , HL, PCOS, GERD, obesity, chronic thoracic spine/neck pain BL hearing loss, OSA, schizoaffective bipolar type/PTSD/MDD/GAD, OA knees   Discussed the use of AI scribe software for clinical note transcription with the patient, who gave verbal consent to proceed.  History of Present Illness Sarah Evans is a 43 year old female who presents with bladder pain and dysuria following a hemorrhoidectomy that was done 1 mth ago. .  Bladder pain and dysuria have worsening over the past two weeks. There is no hematuria or fever. Initially, urine had a strong odor, which resolved. Macrobid  was prescribed during a virtual visit on July 5th and has been taken for five days, improving the urine odor but not the bladder pain.    Patient Active Problem List   Diagnosis Date Noted   Well woman exam with routine gynecological exam 11/18/2022   Vision changes 08/16/2022   Bilateral hearing loss 08/16/2022   Chronic pain of both knees 07/24/2021   Sleep apnea 06/20/2021   Cholelithiasis 06/20/2021   History of PCOS 05/30/2021   Hirsutism 05/30/2021   Chronic neck pain 11/01/2020   Chronic thoracic spine pain 10/25/2020   GERD without esophagitis 05/27/2020   Numbness and tingling 05/24/2020   History of stroke 05/23/2020   BMI 29.0-29.9,adult 05/21/2020   Herpes simplex vulvovaginitis 05/21/2020   Prediabetes 04/12/2020   Intrinsic eczema 04/12/2020   Mixed hyperlipidemia 04/12/2020   Muscle spasticity 04/12/2020   Moderate episode of recurrent major depressive disorder (HCC) 06/09/2019   Generalized anxiety disorder 06/09/2019   Personal history of nonsuicidal  self-harm 04/28/2018   PTSD (post-traumatic stress disorder) 04/30/2015   Schizoaffective disorder, bipolar type (HCC) 04/27/2015     Current Outpatient Medications on File Prior to Visit  Medication Sig Dispense Refill   acetaminophen  (TYLENOL ) 500 MG tablet Take 1,000 mg by mouth every 6 (six) hours as needed for moderate pain.     atorvastatin  (LIPITOR) 10 MG tablet Take 1 tablet (10 mg total) by mouth daily. 90 tablet 3   diazepam  (VALIUM ) 5 MG tablet Take one tablet by mouth with light food one hour prior to procedure. 1 tablet 0   ketoconazole  (NIZORAL ) 2 % shampoo Apply topically 2 (two) times a week. APPLY TOPICALLY 2 TIMES A WEEK 120 mL 3   minoxidil (LONITEN) 2.5 MG tablet Take 5 mg by mouth daily.     nitrofurantoin , macrocrystal-monohydrate, (MACROBID ) 100 MG capsule Take 1 capsule (100 mg total) by mouth 2 (two) times daily for 5 days. 10 capsule 0   pantoprazole  (PROTONIX ) 20 MG tablet Take 1 tablet (20 mg total) by mouth daily. 30 tablet 0   prazosin  (MINIPRESS ) 2 MG capsule Take 1 capsule (2 mg total) by mouth at bedtime as needed. 30 capsule 0   Prenatal Vit-Fe Fumarate-FA (PRENATAL MULTIVITAMIN) TABS tablet Take 1 tablet by mouth every evening.     QUEtiapine  (SEROQUEL  XR) 300 MG 24 hr tablet Take 1 tablet (300 mg total) by mouth at bedtime. 30 tablet 2   QUEtiapine  (SEROQUEL ) 50 MG tablet Take 1 tablet (50 mg total) by mouth 2 (two) times daily. 60 tablet 2   Semaglutide ,  2 MG/DOSE, 8 MG/3ML SOPN Inject 2 mg as directed once a week. 3 mL 2   tiZANidine  (ZANAFLEX ) 4 MG tablet TAKE 1 TABLET(4 MG) BY MOUTH EVERY 6 HOURS AS NEEDED FOR MUSCLE SPASMS 30 tablet 1   valACYclovir  (VALTREX ) 1000 MG tablet Take 1 tablet (1,000 mg total) by mouth once daily. (Patient taking differently: Take 1,000 mg by mouth daily as needed.) 60 tablet 2   zolpidem  (AMBIEN ) 5 MG tablet Take 1 tablet (5 mg total) by mouth at bedtime as needed for sleep. 15 tablet 1   No current facility-administered  medications on file prior to visit.    No Known Allergies  Social History   Socioeconomic History   Marital status: Single    Spouse name: Not on file   Number of children: Not on file   Years of education: Not on file   Highest education level: Not on file  Occupational History   Not on file  Tobacco Use   Smoking status: Former    Current packs/day: 0.00    Average packs/day: 0.3 packs/day for 15.0 years (3.8 ttl pk-yrs)    Types: Cigarettes    Start date: 05/02/2005    Quit date: 05/02/2020    Years since quitting: 3.2   Smokeless tobacco: Never  Vaping Use   Vaping status: Never Used  Substance and Sexual Activity   Alcohol use: No   Drug use: No   Sexual activity: Not Currently    Birth control/protection: Abstinence  Other Topics Concern   Not on file  Social History Narrative   Not on file   Social Drivers of Health   Financial Resource Strain: High Risk (12/19/2022)   Overall Financial Resource Strain (CARDIA)    Difficulty of Paying Living Expenses: Very hard  Food Insecurity: No Food Insecurity (12/19/2022)   Hunger Vital Sign    Worried About Running Out of Food in the Last Year: Never true    Ran Out of Food in the Last Year: Never true  Transportation Needs: No Transportation Needs (12/19/2022)   PRAPARE - Administrator, Civil Service (Medical): No    Lack of Transportation (Non-Medical): No  Physical Activity: Insufficiently Active (12/19/2022)   Exercise Vital Sign    Days of Exercise per Week: 2 days    Minutes of Exercise per Session: 20 min  Stress: No Stress Concern Present (12/19/2022)   [employer] of Occupational Health - Occupational Stress Questionnaire    Feeling of Stress : Not at all  Social Connections: Socially Isolated (12/19/2022)   Social Connection and Isolation Panel    Frequency of Communication with Friends and Family: Once a week    Frequency of Social Gatherings with Friends and Family: Once a week     Attends Religious Services: Never    Database administrator or Organizations: No    Attends Banker Meetings: Never    Marital Status: Never married  Intimate Partner Violence: Not At Risk (12/19/2022)   Humiliation, Afraid, Rape, and Kick questionnaire    Fear of Current or Ex-Partner: No    Emotionally Abused: No    Physically Abused: No    Sexually Abused: No    Family History  Problem Relation Age of Onset   Bipolar disorder Mother    Diabetes Maternal Grandmother    Hypertension Neg Hx    Heart disease Neg Hx    Cancer Neg Hx     Past Surgical History:  Procedure  Laterality Date   BUBBLE STUDY  05/25/2020   Procedure: BUBBLE STUDY;  Surgeon: Mona Vinie BROCKS, MD;  Location[facility] ENDOSCOPY;  Service: Cardiovascular;;   NO PAST SURGERIES     TEE WITHOUT CARDIOVERSION N/A 05/25/2020   Procedure: TRANSESOPHAGEAL ECHOCARDIOGRAM (TEE);  Surgeon: Mona Vinie BROCKS, MD;  Location: [facility] ENDOSCOPY;  Service: Cardiovascular;  Laterality: N/A;    ROS: Review of Systems Negative except as stated above  PHYSICAL EXAM: BP 106/72 (BP Location: Left Arm, Patient Position: Standing, Cuff Size: Normal)   Pulse (!) 112   Temp 99.3 F (37.4 C) (Oral)   Ht 5' 7 (1.702 m)   Wt 156 lb (70.8 kg)   SpO2 99%   BMI 24.43 kg/m   Physical Exam  General appearance - alert, well appearing, and in no distress Mental status - normal mood, behavior, speech, dress, motor activity, and thought processes Abdomen - soft, nontender, nondistended, no masses or organomegaly  UA POC: neg nitrates/neg LE Turbid in color      Latest Ref Rng & Units 03/07/2023    9:58 PM 02/09/2023    4:23 PM 02/09/2023    4:22 PM  CMP  Glucose 70 - 99 mg/dL 79   78   BUN 6 - 20 mg/dL 12   7   Creatinine 9.55 - 1.00 mg/dL 9.21   9.02   Sodium 864 - 145 mmol/L 139   143   Potassium 3.5 - 5.1 mmol/L 3.3   4.4   Chloride 98 - 111 mmol/L 105   107   CO2 22 - 32 mmol/L 27   22   Calcium  8.9 - 10.3 mg/dL 9.0    9.2   Total Protein 6.0 - 8.5 g/dL  6.4  6.7   Total Bilirubin 0.0 - 1.2 mg/dL  0.4  0.4   Alkaline Phos 44 - 121 IU/L  79  82   AST 0 - 40 IU/L  21  24   ALT 0 - 32 IU/L  47  50    Lipid Panel     Component Value Date/Time   CHOL 188 02/09/2023 1620   TRIG 61 02/09/2023 1620   HDL 71 02/09/2023 1620   CHOLHDL 2.6 02/09/2023 1620   CHOLHDL 2.8 05/24/2020 0332   VLDL 28 05/24/2020 0332   LDLCALC 106 (H) 02/09/2023 1620    CBC    Component Value Date/Time   WBC 6.4 03/07/2023 2158   RBC 4.19 03/07/2023 2158   HGB 12.1 03/07/2023 2158   HGB 12.3 02/09/2023 1621   HCT 38.3 03/07/2023 2158   HCT 39.6 02/09/2023 1621   PLT 336 03/07/2023 2158   PLT 379 02/09/2023 1621   MCV 91.4 03/07/2023 2158   MCV 92 02/09/2023 1621   MCH 28.9 03/07/2023 2158   MCHC 31.6 03/07/2023 2158   RDW 13.2 03/07/2023 2158   RDW 13.1 02/09/2023 1621   LYMPHSABS 1.8 02/09/2023 1621   MONOABS 0.5 05/22/2021 1555   EOSABS 0.1 02/09/2023 1621   BASOSABS 0.1 02/09/2023 1621    ASSESSMENT AND PLAN:  Assessment and Plan Assessment & Plan Dysuria Persistent dysuria with bladder pain post-hemorrhoidectomy, no hematuria or fever. Macrobid  improved urine odor but not pain. Urine dip today shows turbid color but otherwise no nitrates or leukocyte esterase We will have her stop the Macrobid  and place her on Cipro  500 mg twice a day for 3 days instead.  Patient informed that the Cipro  can adversely interact with the Zanaflex .  Recommend that  she hold off on taking the Zanaflex  for the 3 days that she takes the antibiotics.     There are no diagnoses linked to this encounter.   Patient was given the opportunity to ask questions.  Patient verbalized understanding of the plan and was able to repeat key elements of the plan.   This documentation was completed using Paediatric nurse.  Any transcriptional errors are unintentional.  No orders of the defined types were placed in this  encounter.    Requested Prescriptions    No prescriptions requested or ordered in this encounter    No follow-ups on file.  Barnie Louder, MD, FACP

## 2023-07-15 LAB — POCT URINALYSIS DIP (CLINITEK)
Bilirubin, UA: NEGATIVE
Blood, UA: NEGATIVE
Glucose, UA: NEGATIVE mg/dL
Leukocytes, UA: NEGATIVE
Nitrite, UA: NEGATIVE
POC PROTEIN,UA: NEGATIVE
Spec Grav, UA: 1.02 (ref 1.010–1.025)
Urobilinogen, UA: 0.2 U/dL
pH, UA: 7 (ref 5.0–8.0)

## 2023-07-17 ENCOUNTER — Other Ambulatory Visit: Payer: Self-pay

## 2023-07-18 ENCOUNTER — Emergency Department (HOSPITAL_COMMUNITY)
Admission: EM | Admit: 2023-07-18 | Discharge: 2023-07-19 | Disposition: A | Discharge status: Home / Self Care | Attending: Emergency Medicine | Admitting: Emergency Medicine

## 2023-07-18 ENCOUNTER — Encounter (HOSPITAL_COMMUNITY): Payer: Self-pay | Admitting: Emergency Medicine

## 2023-07-18 ENCOUNTER — Other Ambulatory Visit: Payer: Self-pay

## 2023-07-18 DIAGNOSIS — R300 Dysuria: Secondary | ICD-10-CM | POA: Diagnosis not present

## 2023-07-18 LAB — URINALYSIS, ROUTINE W REFLEX MICROSCOPIC
Bilirubin Urine: NEGATIVE
Glucose, UA: NEGATIVE mg/dL
Ketones, ur: NEGATIVE mg/dL
Leukocytes,Ua: NEGATIVE
Nitrite: NEGATIVE
Protein, ur: NEGATIVE mg/dL
Specific Gravity, Urine: 1.011 (ref 1.005–1.030)
pH: 5 (ref 5.0–8.0)

## 2023-07-18 LAB — PREGNANCY, URINE: Preg Test, Ur: NEGATIVE

## 2023-07-18 NOTE — ED Provider Notes (Signed)
 [facility] Emergency Department Provider Note MRN:  [id number]  Arrival date & time: 07/19/23     Chief Complaint   Dysuria   History of Present Illness   Sarah Evans is a 43 y.o. year-old female presents to the ED with chief complaint of dysuria for the past 4 weeks.  Noticed it after having hemorrhoidectomy.   States that she has tried Cipro  and Nitrofurantoin  for UTI.  States that she continues to have dysuria.  Denies fever, chills, nausea, or vomiting.  History provided by patient.   Review of Systems  Pertinent positive and negative review of systems noted in HPI.    Physical Exam   Vitals:   07/18/23 2330  BP: 110/85  Pulse: (!) 108  Resp: 17  Temp: 98.7 F (37.1 C)  SpO2: 100%    CONSTITUTIONAL:  non toxic-appearing, NAD NEURO:  Alert and oriented x 3, CN 3-12 grossly intact EYES:  eyes equal and reactive ENT/NECK:  Supple, no stridor  CARDIO:  tachycardic, regular rhythm, appears well-perfused  PULM:  No respiratory distress, CTAB GI/GU:  non-distended,  MSK/SPINE:  No gross deformities, no edema, moves all extremities  SKIN:  no rash, atraumatic   *Additional and/or pertinent findings included in MDM below  Diagnostic and Interventional Summary    EKG Interpretation Date/Time:    Ventricular Rate:    PR Interval:    QRS Duration:    QT Interval:    QTC Calculation:   R Axis:      Text Interpretation:         Labs Reviewed  URINALYSIS, ROUTINE W REFLEX MICROSCOPIC - Abnormal; Notable for the following components:      Result Value   Hgb urine dipstick SMALL (*)    Bacteria, UA RARE (*)    All other components within normal limits  URINE CULTURE  PREGNANCY, URINE    No orders to display    Medications  cephALEXin  (KEFLEX ) capsule 500 mg (has no administration in time range)     Procedures  /  Critical Care Procedures  ED Course and Medical Decision Making  I have reviewed the triage vital signs, the  nursing notes, and pertinent available records from the EMR.  Social Determinants Affecting Complexity of Care: Patient has no clinically significant social determinants affecting this chief complaint..   ED Course:    Medical Decision Making Patient here with persistent dysuria for the past 4 weeks.  She has tried treatment with Cipro  and nitrofurantoin .  She has not had any significant improvement in her symptoms.  Question if she is having resistance to these medications.  Will send urine for culture.  Will switch to Keflex .  Recommend PCP follow-up.  Patient is agreeable with plan.  Amount and/or Complexity of Data Reviewed Labs: ordered.  Risk Prescription drug management.         Consultants: No consultations were needed in caring for this patient.   Treatment and Plan: Emergency department workup does not suggest an emergent condition requiring admission or immediate intervention beyond  what has been performed at this time. The patient is safe for discharge and has  been instructed to return immediately for worsening symptoms, change in  symptoms or any other concerns    Final Clinical Impressions(s) / ED Diagnoses     ICD-10-CM   1. Dysuria  R30.0       ED Discharge Orders          Ordered    cephALEXin  (  KEFLEX ) 500 MG capsule  2 times daily        07/19/23 0004              Discharge Instructions Discussed with and Provided to Patient:   Discharge Instructions   None      Vicky Charleston, PA-C 07/19/23 0023    Trine Raynell Moder, MD 07/20/23 [id number]

## 2023-07-18 NOTE — ED Triage Notes (Signed)
 Pt reports 4 weeks of dysuria that began after her hemorrhoidectomy surgery.  Pt reports she has tried antibiotics twice without resolution

## 2023-07-19 DIAGNOSIS — R300 Dysuria: Secondary | ICD-10-CM | POA: Diagnosis not present

## 2023-07-19 MED ORDER — ACETAMINOPHEN 500 MG PO TABS
1000.0000 mg | ORAL_TABLET | Freq: Once | ORAL | Status: AC
  Administered 2023-07-19: 1000 mg via ORAL
  Filled 2023-07-19: qty 2

## 2023-07-19 MED ORDER — CEPHALEXIN 500 MG PO CAPS
500.0000 mg | ORAL_CAPSULE | Freq: Once | ORAL | Status: AC
  Administered 2023-07-19: 500 mg via ORAL
  Filled 2023-07-19: qty 1

## 2023-07-19 MED ORDER — CEPHALEXIN 500 MG PO CAPS
500.0000 mg | ORAL_CAPSULE | Freq: Two times a day (BID) | ORAL | 0 refills | Status: DC
Start: 2023-07-19 — End: 2023-09-15
  Filled 2023-07-19: qty 14, 7d supply, fill #0

## 2023-07-20 ENCOUNTER — Other Ambulatory Visit: Payer: Self-pay

## 2023-07-20 LAB — URINE CULTURE: Culture: 80000 — AB

## 2023-07-27 ENCOUNTER — Telehealth: Payer: Self-pay

## 2023-07-27 NOTE — Telephone Encounter (Signed)
 Patient name and DOB verified.  Patient grateful for the call but states she got it figured out after all.

## 2023-07-27 NOTE — Telephone Encounter (Signed)
 Copied from CRM [id number]. Topic: Clinical - Medication Question >> Jul 27, 2023 10:04 AM Gustabo D wrote: Patient is wanting a call back about a medication she's taking she doesn't know the name of it

## 2023-08-06 ENCOUNTER — Ambulatory Visit: Admitting: Obstetrics and Gynecology

## 2023-08-07 ENCOUNTER — Other Ambulatory Visit: Payer: Self-pay

## 2023-08-13 ENCOUNTER — Ambulatory Visit: Admitting: Radiology

## 2023-08-17 ENCOUNTER — Other Ambulatory Visit: Payer: Self-pay

## 2023-08-26 ENCOUNTER — Other Ambulatory Visit: Payer: Self-pay | Admitting: Internal Medicine

## 2023-08-26 DIAGNOSIS — A6004 Herpesviral vulvovaginitis: Secondary | ICD-10-CM

## 2023-08-26 MED ORDER — ATORVASTATIN CALCIUM 10 MG PO TABS: 10.0000 mg | ORAL_TABLET | Freq: Every day | ORAL | 3 refills | Status: DC

## 2023-08-26 MED ORDER — VALACYCLOVIR HCL 1 G PO TABS: 1000.0000 mg | ORAL_TABLET | Freq: Every day | ORAL | 2 refills | Status: DC | PRN

## 2023-09-03 ENCOUNTER — Other Ambulatory Visit: Payer: Self-pay

## 2023-09-03 DIAGNOSIS — F250 Schizoaffective disorder, bipolar type: Secondary | ICD-10-CM | POA: Diagnosis not present

## 2023-09-03 DIAGNOSIS — F330 Major depressive disorder, recurrent, mild: Secondary | ICD-10-CM | POA: Diagnosis not present

## 2023-09-03 DIAGNOSIS — F4310 Post-traumatic stress disorder, unspecified: Secondary | ICD-10-CM | POA: Diagnosis not present

## 2023-09-04 ENCOUNTER — Other Ambulatory Visit: Payer: Self-pay

## 2023-09-04 ENCOUNTER — Telehealth: Payer: Self-pay | Admitting: Internal Medicine

## 2023-09-04 NOTE — Telephone Encounter (Signed)
 Let pt know that Trulicity does not bring about as much weight loss as Ozempic . If she still wants to change then I will send prescription for Trulicity 0.75 mg to her pharmacy. Once dispense, she should have pharmacist show her how to administer.

## 2023-09-04 NOTE — Telephone Encounter (Signed)
 Copied from CRM [id number]. Topic: Clinical - Medical Advice >> Sep 04, 2023  1:00 PM Sophia H wrote:  Reason for CRM: Patient would like to speak with Dr. Ferdie nurse regarding medication questions. Please reach out # [phone]

## 2023-09-04 NOTE — Telephone Encounter (Signed)
 Patient called back to see if provider would change her script for Ozempic  to Trulicity as it is the med that's on her free drug list. Please f/u with patient

## 2023-09-08 ENCOUNTER — Other Ambulatory Visit: Payer: Self-pay

## 2023-09-08 NOTE — Telephone Encounter (Signed)
 Called & spoke to the patient. Verified name & DOB. Informed of rthat per Dr.Johnsn that Trulicity  does not bring about as much weight loss as Ozempic . If she still wants to change then a prescription can be sent for Trulicty 0.75 mg to your pharmacy. Once dispensed she should have pharmacist show how to administer. Patient expressed verbal understanding but still has some additional questions. She stated that she has hit a plateau with her weight loss and has recently gained 20 lbs. She would like to know if she should temporarily stop taking Ozempic  and then restart to help her get over the plateau. Otherwise, do you recommend going forward with Trulicy. Please advise.

## 2023-09-09 ENCOUNTER — Other Ambulatory Visit: Payer: Self-pay

## 2023-09-09 MED ORDER — TRULICITY 0.75 MG/0.5ML ~~LOC~~ SOAJ
0.7500 mg | SUBCUTANEOUS | 1 refills | Status: DC
  Filled 2023-09-09: qty 2, 28d supply, fill #0

## 2023-09-09 NOTE — Telephone Encounter (Signed)
 Pt sent Mychart message. Ozempic  changed to Trulicity 

## 2023-09-09 NOTE — Addendum Note (Signed)
 Addended by: VICCI SOBER B on: 09/09/2023 11:21 AM   Modules accepted: Orders

## 2023-09-09 NOTE — Telephone Encounter (Signed)
 Called & spoke to the patient. Verified name & DOB. Informed that a video visit is advised to answer all questions and concerns. Patient declined and has decided to go forward with Trulicity . Please advise.

## 2023-09-09 NOTE — Telephone Encounter (Signed)
 Give pt a video visit for 1:30 p.m tomorrow but request she signs on at 1:20 p.m.

## 2023-09-15 ENCOUNTER — Ambulatory Visit: Attending: Internal Medicine | Admitting: Internal Medicine

## 2023-09-15 VITALS — BP 125/80 | HR 105 | Temp 98.9°F | Ht 67.0 in | Wt 156.8 lb

## 2023-09-15 DIAGNOSIS — R002 Palpitations: Secondary | ICD-10-CM

## 2023-09-15 DIAGNOSIS — N390 Urinary tract infection, site not specified: Secondary | ICD-10-CM | POA: Diagnosis not present

## 2023-09-15 DIAGNOSIS — F330 Major depressive disorder, recurrent, mild: Secondary | ICD-10-CM | POA: Diagnosis not present

## 2023-09-15 DIAGNOSIS — F4310 Post-traumatic stress disorder, unspecified: Secondary | ICD-10-CM | POA: Diagnosis not present

## 2023-09-15 DIAGNOSIS — F250 Schizoaffective disorder, bipolar type: Secondary | ICD-10-CM | POA: Diagnosis not present

## 2023-09-15 LAB — POCT URINALYSIS DIP (CLINITEK)
Blood, UA: NEGATIVE
Glucose, UA: NEGATIVE mg/dL
Ketones, POC UA: NEGATIVE mg/dL
Nitrite, UA: POSITIVE — AB
Spec Grav, UA: >=1.03 — AB (ref 1.010–1.025)
Urobilinogen, UA: 0.2 U/dL
pH, UA: 6.5 (ref 5.0–8.0)

## 2023-09-15 MED ORDER — CEPHALEXIN 500 MG PO CAPS
500.0000 mg | ORAL_CAPSULE | Freq: Two times a day (BID) | ORAL | 0 refills | Status: AC
Start: 2023-09-15 — End: ?

## 2023-09-15 NOTE — Progress Notes (Unsigned)
 Patient ID: Sarah Evans, female    DOB: January 29, 1980  MRN: [id number]  CC: foul smelling urine   Subjective: Sarah Evans is a 43 y.o. female who presents for chronic ds management. Her concerns today include:  Patient with history of prediabetes on Ozempic , HL, PCOS, GERD, obesity, chronic thoracic spine/neck pain BL hearing loss, OSA, schizoaffective bipolar type/PTSD/MDD/GAD, OA knees   Discussed the use of AI scribe software for clinical note transcription with the patient, who gave verbal consent to proceed.  History of Present Illness Sarah Evans is a 43 year old female who presents with symptoms suggestive of a urinary tract infection.  She has been experiencing cloudy and mildly odorous urine for the past three days. No hematuria, fever, or dysuria. She had similar symptoms in July, which were treated with Cefalexin 500 mg twice a day, resolving her symptoms. At that time, a urine culture did grow Lactobacillus, which is usually considered a contaminant in urine culture. Wonders about needing to see specialist since she has had UTI several times over past several months.  Last month, she had a mild yeast infection and took Monistat. Currently, she denies any abnormal vaginal discharge.  For the last couple of visits, the patient reports that she was told her heart rate was a bit elevated. She experiences palpitations, especially when getting up to use the restroom at night, accompanied by feelings of faintness and dizziness. She does not have a device to monitor her heart rate at home but plans to get one. She feels she drinks adequate fluids during the day.    Patient Active Problem List   Diagnosis Date Noted   Well woman exam with routine gynecological exam 11/18/2022   Vision changes 08/16/2022   Bilateral hearing loss 08/16/2022   Chronic pain of both knees 07/24/2021   Sleep apnea 06/20/2021   Cholelithiasis 06/20/2021   History of PCOS 05/30/2021   Hirsutism  05/30/2021   Chronic neck pain 11/01/2020   Chronic thoracic spine pain 10/25/2020   GERD without esophagitis 05/27/2020   Numbness and tingling 05/24/2020   History of stroke 05/23/2020   BMI 29.0-29.9,adult 05/21/2020   Herpes simplex vulvovaginitis 05/21/2020   Prediabetes 04/12/2020   Intrinsic eczema 04/12/2020   Mixed hyperlipidemia 04/12/2020   Muscle spasticity 04/12/2020   Moderate episode of recurrent major depressive disorder (HCC) 06/09/2019   Generalized anxiety disorder 06/09/2019   Personal history of nonsuicidal self-harm 04/28/2018   PTSD (post-traumatic stress disorder) 04/30/2015   Schizoaffective disorder, bipolar type (HCC) 04/27/2015     Current Outpatient Medications on File Prior to Visit  Medication Sig Dispense Refill   acetaminophen  (TYLENOL ) 500 MG tablet Take 1,000 mg by mouth every 6 (six) hours as needed for moderate pain.     atorvastatin  (LIPITOR) 10 MG tablet Take 1 tablet (10 mg total) by mouth daily. 90 tablet 3   diazepam  (VALIUM ) 5 MG tablet Take one tablet by mouth with light food one hour prior to procedure. 1 tablet 0   Dulaglutide  (TRULICITY ) 0.75 MG/0.5ML SOAJ Inject 0.75 mg into the skin once a week. Stop Ozempic  2 mL 1   ketoconazole  (NIZORAL ) 2 % shampoo Apply topically 2 (two) times a week. APPLY TOPICALLY 2 TIMES A WEEK 120 mL 3   minoxidil (LONITEN) 2.5 MG tablet Take 5 mg by mouth daily.     pantoprazole  (PROTONIX ) 20 MG tablet Take 1 tablet (20 mg total) by mouth daily. 30 tablet 0   prazosin  (MINIPRESS ) 2  MG capsule Take 1 capsule (2 mg total) by mouth at bedtime as needed. 30 capsule 0   Prenatal Vit-Fe Fumarate-FA (PRENATAL MULTIVITAMIN) TABS tablet Take 1 tablet by mouth every evening.     QUEtiapine  (SEROQUEL  XR) 300 MG 24 hr tablet Take 1 tablet (300 mg total) by mouth at bedtime. 30 tablet 2   QUEtiapine  (SEROQUEL ) 50 MG tablet Take 1 tablet (50 mg total) by mouth 2 (two) times daily. 60 tablet 2   tiZANidine  (ZANAFLEX ) 4 MG  tablet TAKE 1 TABLET(4 MG) BY MOUTH EVERY 6 HOURS AS NEEDED FOR MUSCLE SPASMS 30 tablet 1   valACYclovir  (VALTREX ) 1000 MG tablet Take 1 tablet (1,000 mg total) by mouth daily as needed. 60 tablet 2   zolpidem  (AMBIEN ) 5 MG tablet Take 1 tablet (5 mg total) by mouth at bedtime as needed for sleep. 15 tablet 1   No current facility-administered medications on file prior to visit.    No Known Allergies  Social History   Socioeconomic History   Marital status: Single    Spouse name: Not on file   Number of children: Not on file   Years of education: Not on file   Highest education level: Not on file  Occupational History   Not on file  Tobacco Use   Smoking status: Former    Current packs/day: 0.00    Average packs/day: 0.3 packs/day for 15.0 years (3.8 ttl pk-yrs)    Types: Cigarettes    Start date: 05/02/2005    Quit date: 05/02/2020    Years since quitting: 3.3   Smokeless tobacco: Never  Vaping Use   Vaping status: Never Used  Substance and Sexual Activity   Alcohol use: No   Drug use: No   Sexual activity: Not Currently    Birth control/protection: Abstinence  Other Topics Concern   Not on file  Social History Narrative   Not on file   Social Drivers of Health   Financial Resource Strain: High Risk (12/19/2022)   Overall Financial Resource Strain (CARDIA)    Difficulty of Paying Living Expenses: Very hard  Food Insecurity: No Food Insecurity (12/19/2022)   Hunger Vital Sign    Worried About Running Out of Food in the Last Year: Never true    Ran Out of Food in the Last Year: Never true  Transportation Needs: No Transportation Needs (12/19/2022)   PRAPARE - Administrator, Civil Service (Medical): No    Lack of Transportation (Non-Medical): No  Physical Activity: Insufficiently Active (12/19/2022)   Exercise Vital Sign    Days of Exercise per Week: 2 days    Minutes of Exercise per Session: 20 min  Stress: No Stress Concern Present (12/19/2022)    [employer] of Occupational Health - Occupational Stress Questionnaire    Feeling of Stress : Not at all  Social Connections: Socially Isolated (12/19/2022)   Social Connection and Isolation Panel    Frequency of Communication with Friends and Family: Once a week    Frequency of Social Gatherings with Friends and Family: Once a week    Attends Religious Services: Never    Database administrator or Organizations: No    Attends Banker Meetings: Never    Marital Status: Never married  Intimate Partner Violence: Not At Risk (12/19/2022)   Humiliation, Afraid, Rape, and Kick questionnaire    Fear of Current or Ex-Partner: No    Emotionally Abused: No    Physically Abused: No  Sexually Abused: No    Family History  Problem Relation Age of Onset   Bipolar disorder Mother    Diabetes Maternal Grandmother    Hypertension Neg Hx    Heart disease Neg Hx    Cancer Neg Hx     Past Surgical History:  Procedure Laterality Date   BUBBLE STUDY  05/25/2020   Procedure: BUBBLE STUDY;  Surgeon: Mona Vinie BROCKS, MD;  Location: [facility] ENDOSCOPY;  Service: Cardiovascular;;   NO PAST SURGERIES     TEE WITHOUT CARDIOVERSION N/A 05/25/2020   Procedure: TRANSESOPHAGEAL ECHOCARDIOGRAM (TEE);  Surgeon: Mona Vinie BROCKS, MD;  Location: [facility] ENDOSCOPY;  Service: Cardiovascular;  Laterality: N/A;    ROS: Review of Systems Negative except as stated above  PHYSICAL EXAM: BP 125/80 (BP Location: Left Arm, Patient Position: Sitting, Cuff Size: Normal)   Pulse (!) 105   Temp 98.9 F (37.2 C) (Oral)   Ht 5' 7 (1.702 m)   Wt 156 lb 12.8 oz (71.1 kg)   SpO2 99%   BMI 24.56 kg/m   Physical Exam Sitting: BP 114/79, P96 Standing: BP 114/81, P109  General appearance - alert, well appearing, middle-age African-American female and in no distress Mental status - normal mood, behavior, speech, dress, motor activity, and thought processes Eyes - slightly pale conjunctiva Chest: no wheezes,  rales or rhonchi, symmetric air entry Heart - normal rate, regular rhythm, normal S1, S2, no murmurs, rubs, clicks or gallops  EKG: normal EKG  Results for orders placed or performed in visit on 09/15/23  POCT URINALYSIS DIP (CLINITEK)   Collection Time: 09/15/23  4:03 PM  Result Value Ref Range   Color, UA other (A) yellow   Clarity, UA cloudy (A) clear   Glucose, UA negative negative mg/dL   Bilirubin, UA small (A) negative   Ketones, POC UA negative negative mg/dL   Spec Grav, UA >=8.969 (A) 1.010 - 1.025   Blood, UA negative negative   pH, UA 6.5 5.0 - 8.0   POC PROTEIN,UA trace negative, trace   Urobilinogen, UA 0.2 0.2 or 1.0 E.U./dL   Nitrite, UA Positive (A) Negative   Leukocytes, UA Trace (A) Negative  TSH+T4F+T3Free   Collection Time: 09/15/23  4:31 PM  Result Value Ref Range   TSH 1.390 0.450 - 4.500 uIU/mL   T3, Free 2.6 2.0 - 4.4 pg/mL   Free T4 1.06 0.82 - 1.77 ng/dL  Basic Metabolic Panel   Collection Time: 09/15/23  4:31 PM  Result Value Ref Range   Glucose 70 70 - 99 mg/dL   BUN 10 6 - 24 mg/dL   Creatinine, Ser 9.01 0.57 - 1.00 mg/dL   eGFR 74 >40 fO/fpw/8.26   BUN/Creatinine Ratio 10 9 - 23   Sodium 142 134 - 144 mmol/L   Potassium 4.1 3.5 - 5.2 mmol/L   Chloride 106 96 - 106 mmol/L   CO2 23 20 - 29 mmol/L   Calcium  9.1 8.7 - 10.2 mg/dL       Latest Ref Rng & Units 09/15/2023    4:31 PM 03/07/2023    9:58 PM 02/09/2023    4:23 PM  CMP  Glucose 70 - 99 mg/dL 70  79    BUN 6 - 24 mg/dL 10  12    Creatinine 9.42 - 1.00 mg/dL 9.01  9.21    Sodium 865 - 144 mmol/L 142  139    Potassium 3.5 - 5.2 mmol/L 4.1  3.3    Chloride 96 - 106  mmol/L 106  105    CO2 20 - 29 mmol/L 23  27    Calcium  8.7 - 10.2 mg/dL 9.1  9.0    Total Protein 6.0 - 8.5 g/dL   6.4   Total Bilirubin 0.0 - 1.2 mg/dL   0.4   Alkaline Phos 44 - 121 IU/L   79   AST 0 - 40 IU/L   21   ALT 0 - 32 IU/L   47    Lipid Panel     Component Value Date/Time   CHOL 188 02/09/2023 1620    TRIG 61 02/09/2023 1620   HDL 71 02/09/2023 1620   CHOLHDL 2.6 02/09/2023 1620   CHOLHDL 2.8 05/24/2020 0332   VLDL 28 05/24/2020 0332   LDLCALC 106 (H) 02/09/2023 1620    CBC    Component Value Date/Time   WBC 6.4 03/07/2023 2158   RBC 4.19 03/07/2023 2158   HGB 12.1 03/07/2023 2158   HGB 12.3 02/09/2023 1621   HCT 38.3 03/07/2023 2158   HCT 39.6 02/09/2023 1621   PLT 336 03/07/2023 2158   PLT 379 02/09/2023 1621   MCV 91.4 03/07/2023 2158   MCV 92 02/09/2023 1621   MCH 28.9 03/07/2023 2158   MCHC 31.6 03/07/2023 2158   RDW 13.2 03/07/2023 2158   RDW 13.1 02/09/2023 1621   LYMPHSABS 1.8 02/09/2023 1621   MONOABS 0.5 05/22/2021 1555   EOSABS 0.1 02/09/2023 1621   BASOSABS 0.1 02/09/2023 1621    ASSESSMENT AND PLAN: 1. Recurrent UTI (Primary) Urinalysis confirmed UTI. No dysuria, hematuria, or fever. Previous episode treated with Cefalexin. - Prescribe Cefalexin 500 mg twice daily. - Order urine culture. - Consider urology referral for recurrent issues. - POCT URINALYSIS DIP (CLINITEK) - Urine Culture - Ambulatory referral to Urology - cephALEXin  (KEFLEX ) 500 MG capsule; Take 1 capsule (500 mg total) by mouth 2 (two) times daily.  Dispense: 14 capsule; Refill: 0  2. Palpitations We will check thyroid  hormone level and chemistry and refer to cardiology. Advised to stay hydrated. Last CBC was stable. - [id number] - Ambulatory referral to Cardiology - Basic Metabolic Panel - EKG 12-Lead    Patient was given the opportunity to ask questions.  Patient verbalized understanding of the plan and was able to repeat key elements of the plan.   This documentation was completed using Paediatric nurse.  Any transcriptional errors are unintentional.  Orders Placed This Encounter  Procedures   Urine Culture   TSH+T4F+T3Free   Basic Metabolic Panel   Ambulatory referral to Urology   Ambulatory referral to Cardiology   POCT URINALYSIS DIP (CLINITEK)    EKG 12-Lead   EKG 12-Lead     Requested Prescriptions   Signed Prescriptions Disp Refills   cephALEXin  (KEFLEX ) 500 MG capsule 14 capsule 0    Sig: Take 1 capsule (500 mg total) by mouth 2 (two) times daily.    No follow-ups on file.  Barnie Louder, MD, FACP

## 2023-09-16 ENCOUNTER — Ambulatory Visit: Payer: Self-pay | Admitting: Internal Medicine

## 2023-09-16 LAB — BASIC METABOLIC PANEL WITH GFR
BUN/Creatinine Ratio: 10 (ref 9–23)
BUN: 10 mg/dL (ref 6–24)
CO2: 23 mmol/L (ref 20–29)
Calcium: 9.1 mg/dL (ref 8.7–10.2)
Chloride: 106 mmol/L (ref 96–106)
Creatinine, Ser: 0.98 mg/dL (ref 0.57–1.00)
Glucose: 70 mg/dL (ref 70–99)
Potassium: 4.1 mmol/L (ref 3.5–5.2)
Sodium: 142 mmol/L (ref 134–144)
eGFR: 74 mL/min/1.73 (ref >59)

## 2023-09-16 LAB — TSH+T4F+T3FREE
Free T4: 1.06 ng/dL (ref 0.82–1.77)
T3, Free: 2.6 pg/mL (ref 2.0–4.4)
TSH: 1.39 u[IU]/mL (ref 0.450–4.500)

## 2023-09-17 ENCOUNTER — Emergency Department (HOSPITAL_COMMUNITY)
Admission: EM | Admit: 2023-09-17 | Discharge: 2023-09-18 | Disposition: A | Discharge status: Home / Self Care | Attending: Emergency Medicine | Admitting: Emergency Medicine

## 2023-09-17 ENCOUNTER — Emergency Department (HOSPITAL_COMMUNITY)

## 2023-09-17 DIAGNOSIS — K5900 Constipation, unspecified: Secondary | ICD-10-CM | POA: Diagnosis not present

## 2023-09-17 DIAGNOSIS — Z1152 Encounter for screening for COVID-19: Secondary | ICD-10-CM | POA: Diagnosis present

## 2023-09-17 DIAGNOSIS — R0789 Other chest pain: Secondary | ICD-10-CM | POA: Diagnosis not present

## 2023-09-17 DIAGNOSIS — R079 Chest pain, unspecified: Secondary | ICD-10-CM | POA: Diagnosis not present

## 2023-09-17 DIAGNOSIS — R0602 Shortness of breath: Secondary | ICD-10-CM | POA: Diagnosis present

## 2023-09-17 LAB — CBC
HCT: 38.6 % (ref 36.0–46.0)
Hemoglobin: 12.2 g/dL (ref 12.0–15.0)
MCH: 28.4 pg (ref 26.0–34.0)
MCHC: 31.6 g/dL (ref 30.0–36.0)
MCV: 90 fL (ref 80.0–100.0)
Platelets: 293 K/uL (ref 150–400)
RBC: 4.29 MIL/uL (ref 3.87–5.11)
RDW: 12.7 % (ref 11.5–15.5)
WBC: 4 K/uL (ref 4.0–10.5)
nRBC: 0 % (ref 0.0–0.2)

## 2023-09-17 LAB — BASIC METABOLIC PANEL WITH GFR
Anion gap: 12 (ref 5–15)
BUN: 10 mg/dL (ref 6–20)
CO2: 23 mmol/L (ref 22–32)
Calcium: 9 mg/dL (ref 8.9–10.3)
Chloride: 104 mmol/L (ref 98–111)
Creatinine, Ser: 0.97 mg/dL (ref 0.44–1.00)
GFR, Estimated: >60 mL/min (ref >60)
Glucose, Bld: 93 mg/dL (ref 70–99)
Potassium: 3.7 mmol/L (ref 3.5–5.1)
Sodium: 139 mmol/L (ref 135–145)

## 2023-09-17 LAB — HCG, SERUM, QUALITATIVE: Preg, Serum: NEGATIVE

## 2023-09-17 LAB — TROPONIN T, HIGH SENSITIVITY: Troponin T High Sensitivity: <15 ng/L (ref 0–19)

## 2023-09-17 MED ORDER — IBUPROFEN 200 MG PO TABS
400.0000 mg | ORAL_TABLET | Freq: Once | ORAL | Status: AC | PRN
  Administered 2023-09-17: 400 mg via ORAL
  Filled 2023-09-17: qty 2

## 2023-09-17 NOTE — ED Triage Notes (Signed)
 Patient arrived with constipation over the last three days and tonight began having chest pain and shortness of breath.

## 2023-09-18 DIAGNOSIS — R0789 Other chest pain: Secondary | ICD-10-CM | POA: Diagnosis not present

## 2023-09-18 LAB — D-DIMER, QUANTITATIVE: D-Dimer, Quant: <0.27 ug{FEU}/mL (ref 0.00–0.50)

## 2023-09-18 LAB — RESP PANEL BY RT-PCR (RSV, FLU A&B, COVID)  RVPGX2
Influenza A by PCR: NEGATIVE
Influenza B by PCR: NEGATIVE
Resp Syncytial Virus by PCR: NEGATIVE
SARS Coronavirus 2 by RT PCR: NEGATIVE

## 2023-09-18 LAB — TROPONIN T, HIGH SENSITIVITY: Troponin T High Sensitivity: <15 ng/L (ref 0–19)

## 2023-09-18 MED ORDER — ONDANSETRON 4 MG PO TBDP
4.0000 mg | ORAL_TABLET | Freq: Once | ORAL | Status: AC
  Administered 2023-09-18: 4 mg via ORAL
  Filled 2023-09-18: qty 1

## 2023-09-18 MED ORDER — KETOROLAC TROMETHAMINE 30 MG/ML IJ SOLN
30.0000 mg | Freq: Once | INTRAMUSCULAR | Status: AC
  Administered 2023-09-18: 30 mg via INTRAMUSCULAR
  Filled 2023-09-18: qty 1

## 2023-09-18 NOTE — Discharge Instructions (Signed)
 You were seen today for chest pain and constipation.  Your workup today is reassuring including viral testing.  Start MiraLAX  1 capful daily to help with your stools.  You may take ibuprofen  for chest discomfort.

## 2023-09-18 NOTE — ED Provider Notes (Signed)
 [place] EMERGENCY DEPARTMENT AT [facility] Provider Note   CSN: [id number] Arrival date & time: 09/17/23  2239     Patient presents with: Chest Pain and Constipation   Sarah Evans is a 43 y.o. female.   HPI     This a 43 year old female who presents requesting a COVID test.  Patient reports that she has felt some shortness of breath and chest discomfort over the last several days.  Had a friend who tested positive for COVID.  She also states she has had 3 days of constipation.  No real cough or fevers.  Has not had any nausea or vomiting.  No abdominal pain.  No lower extremity swelling or history of blood clots.  Prior to Admission medications   Medication Sig Start Date End Date Taking? Authorizing Provider  acetaminophen  (TYLENOL ) 500 MG tablet Take 1,000 mg by mouth every 6 (six) hours as needed for moderate pain.    [provider]  atorvastatin  (LIPITOR) 10 MG tablet Take 1 tablet (10 mg total) by mouth daily. 08/26/23   Vicci Barnie NOVAK, MD  cephALEXin  (KEFLEX ) 500 MG capsule Take 1 capsule (500 mg total) by mouth 2 (two) times daily. 09/15/23   Vicci Barnie NOVAK, MD  diazepam  (VALIUM ) 5 MG tablet Take one tablet by mouth with light food one hour prior to procedure. 05/14/23   Williams, Megan E, NP  Dulaglutide  (TRULICITY ) 0.75 MG/0.5ML SOAJ Inject 0.75 mg into the skin once a week. Stop Ozempic  09/09/23   Vicci Barnie NOVAK, MD  ketoconazole  (NIZORAL ) 2 % shampoo Apply topically 2 (two) times a week. APPLY TOPICALLY 2 TIMES A WEEK 05/14/23   Vicci Barnie NOVAK, MD  minoxidil (LONITEN) 2.5 MG tablet Take 5 mg by mouth daily. 11/03/22   [provider]  pantoprazole  (PROTONIX ) 20 MG tablet Take 1 tablet (20 mg total) by mouth daily. 03/08/23   Veta Palma, PA-C  prazosin  (MINIPRESS ) 2 MG capsule Take 1 capsule (2 mg total) by mouth at bedtime as needed. 07/13/23   Arfeen, Leni DASEN, MD  Prenatal Vit-Fe Fumarate-FA (PRENATAL MULTIVITAMIN) TABS tablet  Take 1 tablet by mouth every evening.    [provider]  QUEtiapine  (SEROQUEL  XR) 300 MG 24 hr tablet Take 1 tablet (300 mg total) by mouth at bedtime. 07/13/23   Arfeen, Leni DASEN, MD  QUEtiapine  (SEROQUEL ) 50 MG tablet Take 1 tablet (50 mg total) by mouth 2 (two) times daily. 07/13/23 10/11/23  Arfeen, Leni DASEN, MD  tiZANidine  (ZANAFLEX ) 4 MG tablet TAKE 1 TABLET(4 MG) BY MOUTH EVERY 6 HOURS AS NEEDED FOR MUSCLE SPASMS 07/07/23   Vicci Barnie NOVAK, MD  valACYclovir  (VALTREX ) 1000 MG tablet Take 1 tablet (1,000 mg total) by mouth daily as needed. 08/26/23   Vicci Barnie NOVAK, MD  zolpidem  (AMBIEN ) 5 MG tablet Take 1 tablet (5 mg total) by mouth at bedtime as needed for sleep. 06/18/23   Harl Zane BRAVO, NP    Allergies: Patient has no known allergies.    Review of Systems  Constitutional:  Negative for fever.  Respiratory:  Positive for shortness of breath.   Cardiovascular:  Positive for chest pain. Negative for leg swelling.  Gastrointestinal:  Positive for constipation. Negative for abdominal pain, nausea and vomiting.  All other systems reviewed and are negative.   Updated Vital Signs BP 102/66 (BP Location: Left Arm)   Pulse 77   Temp 98.2 F (36.8 C) (Oral)   Resp 18   LMP 09/07/2023 (Approximate)  SpO2 99%   Physical Exam Vitals and nursing note reviewed.  Constitutional:      Appearance: She is well-developed. She is not ill-appearing.  HENT:     Head: Normocephalic and atraumatic.  Eyes:     Pupils: Pupils are equal, round, and reactive to light.  Cardiovascular:     Rate and Rhythm: Normal rate and regular rhythm.     Heart sounds: Normal heart sounds.  Pulmonary:     Effort: Pulmonary effort is normal. No respiratory distress.     Breath sounds: No wheezing.  Abdominal:     General: Bowel sounds are normal.     Palpations: Abdomen is soft.  Musculoskeletal:     Cervical back: Neck supple.  Skin:    General: Skin is warm and dry.  Neurological:      Mental Status: She is alert and oriented to person, place, and time.  Psychiatric:        Mood and Affect: Mood normal.     (all labs ordered are listed, but only abnormal results are displayed) Labs Reviewed  RESP PANEL BY RT-PCR (RSV, FLU A&B, COVID)  [id number]  BASIC METABOLIC PANEL WITH GFR  CBC  HCG, SERUM, QUALITATIVE  D-DIMER, QUANTITATIVE  TROPONIN T, HIGH SENSITIVITY  TROPONIN T, HIGH SENSITIVITY    EKG: EKG Interpretation Date/Time:  Thursday September 17 2023 23:20:08 EDT Ventricular Rate:  96 PR Interval:  130 QRS Duration:  83 QT Interval:  340 QTC Calculation: 430 R Axis:   76  Text Interpretation: Sinus rhythm Low voltage, precordial leads Borderline T wave abnormalities Confirmed by Bari Pfeiffer ([id number]) on 09/18/2023 3:13:56 AM  Radiology: [facility] Chest 2 View Result Date: 09/17/2023 CLINICAL DATA:  Chest pain EXAM: CHEST - 2 VIEW COMPARISON:  03/07/2023 FINDINGS: The heart size and mediastinal contours are within normal limits. Both lungs are clear. Mild scoliosis IMPRESSION: No active cardiopulmonary disease. Electronically Signed   By: Luke Bun M.D.   On: 09/17/2023 23:45     Procedures   Medications Ordered in the ED  ibuprofen  (ADVIL ) tablet 400 mg (400 mg Oral Given 09/17/23 2337)  ketorolac  (TORADOL ) 30 MG/ML injection 30 mg (30 mg Intramuscular Given 09/18/23 0432)  ondansetron  (ZOFRAN -ODT) disintegrating tablet 4 mg (4 mg Oral Given 09/18/23 0432)                                    Medical Decision Making Amount and/or Complexity of Data Reviewed Labs: ordered. Radiology: ordered.  Risk OTC drugs. Prescription drug management.   This patient presents to the ED for concern of constipation, chest pain, this involves an extensive number of treatment options, and is a complaint that carries with it a high risk of complications and morbidity.  I considered the following differential and admission for this acute, potentially life threatening  condition.  The differential diagnosis includes viral illness, ACS, PE, pneumothorax, pneumonia, chest wall pain  MDM:    This is a 43 year old otherwise healthy female who presents with several complaints.  Requesting a COVID test but also reports constipation and chest pain.  She is nontoxic vital signs are reassuring.  O2 sats 99%.  She was mildly tachycardic upon arrival.  D-dimer was sent and is negative.  Troponin x 2 negative.  EKG shows no evidence of acute ischemia or arrhythmia.  Chest x-ray without pneumothorax or pneumonia.  Overall workup reassuring including COVID and flu testing.  Discussed bowel regimen with the patient.  Abdominal exam is benign.  Doubt acute pathologic emergency.  (Labs, imaging, consults)  Labs: I Ordered, and personally interpreted labs.  The pertinent results include: CBC, BMP, troponin, D-dimer  Imaging Studies ordered: I ordered imaging studies including chest x-ray I independently visualized and interpreted imaging. I agree with the radiologist interpretation  Additional history obtained from chart review.  External records from outside source obtained and reviewed including prior evaluations  Cardiac Monitoring: The patient was maintained on a cardiac monitor.  If on the cardiac monitor, I personally viewed and interpreted the cardiac monitored which showed an underlying rhythm of: Sinus  Reevaluation: After the interventions noted above, I reevaluated the patient and found that they have :improved  Social Determinants of Health:  lives independently  Disposition: Discharge  Co morbidities that complicate the patient evaluation  Past Medical History:  Diagnosis Date   Bipolar 1 disorder (HCC)    Gallstone    Herpes simplex virus (HSV) infection    dx 2001   Polycystic ovary disease    PTSD (post-traumatic stress disorder)    Schizophrenia (HCC)    Stroke (HCC)      Medicines Meds ordered this encounter  Medications   ibuprofen   (ADVIL ) tablet 400 mg   ketorolac  (TORADOL ) 30 MG/ML injection 30 mg   ondansetron  (ZOFRAN -ODT) disintegrating tablet 4 mg    I have reviewed the patients home medicines and have made adjustments as needed  Problem List / ED Course: Problem List Items Addressed This Visit   None Visit Diagnoses       Atypical chest pain    -  Primary     Constipation, unspecified constipation type                    Final diagnoses:  Atypical chest pain  Constipation, unspecified constipation type    ED Discharge Orders     None          Amalie Koran, Charmaine FALCON, MD 09/18/23 [phone]

## 2023-09-19 LAB — URINE CULTURE

## 2023-09-21 DIAGNOSIS — M170 Bilateral primary osteoarthritis of knee: Secondary | ICD-10-CM | POA: Diagnosis not present

## 2023-09-21 DIAGNOSIS — G8929 Other chronic pain: Secondary | ICD-10-CM | POA: Diagnosis not present

## 2023-09-22 DIAGNOSIS — F250 Schizoaffective disorder, bipolar type: Secondary | ICD-10-CM | POA: Diagnosis not present

## 2023-09-22 DIAGNOSIS — F4310 Post-traumatic stress disorder, unspecified: Secondary | ICD-10-CM | POA: Diagnosis not present

## 2023-09-22 DIAGNOSIS — F330 Major depressive disorder, recurrent, mild: Secondary | ICD-10-CM | POA: Diagnosis not present

## 2023-09-25 ENCOUNTER — Other Ambulatory Visit: Payer: Self-pay

## 2023-09-25 ENCOUNTER — Telehealth: Payer: Self-pay

## 2023-09-25 NOTE — Telephone Encounter (Signed)
 Copied from CRM [id number]. Topic: Clinical - Medical Advice >> Sep 25, 2023  3:21 PM Dawna HERO wrote: Reason for CRM: pt says she tried the Dulaglutide  (TRULICITY ) 0.75 MG/0.5ML SOAJ and it left a scar that was very painful would like to go back Ozempic  2mg 

## 2023-09-26 MED ORDER — SEMAGLUTIDE (2 MG/DOSE) 8 MG/3ML ~~LOC~~ SOPN
2.0000 mg | PEN_INJECTOR | SUBCUTANEOUS | 3 refills | Status: DC
  Filled 2023-09-26: qty 6, 56d supply, fill #0
  Filled 2023-11-20: qty 6, 56d supply, fill #1

## 2023-09-26 NOTE — Addendum Note (Signed)
 Addended by: VICCI SOBER B on: 09/26/2023 10:09 AM   Modules accepted: Orders

## 2023-09-28 ENCOUNTER — Other Ambulatory Visit: Payer: Self-pay

## 2023-09-28 ENCOUNTER — Ambulatory Visit: Admitting: "Endocrinology

## 2023-09-29 DIAGNOSIS — F330 Major depressive disorder, recurrent, mild: Secondary | ICD-10-CM | POA: Diagnosis not present

## 2023-09-29 DIAGNOSIS — F4310 Post-traumatic stress disorder, unspecified: Secondary | ICD-10-CM | POA: Diagnosis not present

## 2023-09-29 DIAGNOSIS — F250 Schizoaffective disorder, bipolar type: Secondary | ICD-10-CM | POA: Diagnosis not present

## 2023-10-05 ENCOUNTER — Telehealth (HOSPITAL_COMMUNITY): Payer: Self-pay | Admitting: *Deleted

## 2023-10-05 NOTE — Telephone Encounter (Signed)
 Pt called requesting medication for increased anxiety and depression. Pt does endorse AVH as well as tactile, which she described as violent. Pt could not elaborate as she had crisis services on the other line. But asked that I call her back for more detail. Pt initially asked if she could come in tomorrow or speak with you. Pt says she is also having trouble sleeping if she does not take the Ambien  and asked for refill. Pt should be close to being out of both Seroquel  scripts. Please review and advise.   Last visit: 07/13/23 Next visit: 10/13/23

## 2023-10-05 NOTE — Telephone Encounter (Signed)
 I can see sooner than her scheduled appointment.  Can you move her appointment tomorrow at 1130 virtual.  Thank you

## 2023-10-06 ENCOUNTER — Telehealth (HOSPITAL_BASED_OUTPATIENT_CLINIC_OR_DEPARTMENT_OTHER): Admitting: Psychiatry

## 2023-10-06 ENCOUNTER — Encounter (HOSPITAL_COMMUNITY): Payer: Self-pay | Admitting: Psychiatry

## 2023-10-06 ENCOUNTER — Other Ambulatory Visit: Payer: Self-pay

## 2023-10-06 VITALS — Wt 156.0 lb

## 2023-10-06 DIAGNOSIS — F250 Schizoaffective disorder, bipolar type: Secondary | ICD-10-CM

## 2023-10-06 DIAGNOSIS — F5105 Insomnia due to other mental disorder: Secondary | ICD-10-CM | POA: Diagnosis not present

## 2023-10-06 DIAGNOSIS — L658 Other specified nonscarring hair loss: Secondary | ICD-10-CM | POA: Diagnosis not present

## 2023-10-06 DIAGNOSIS — F99 Mental disorder, not otherwise specified: Secondary | ICD-10-CM | POA: Diagnosis not present

## 2023-10-06 DIAGNOSIS — F330 Major depressive disorder, recurrent, mild: Secondary | ICD-10-CM | POA: Diagnosis not present

## 2023-10-06 DIAGNOSIS — F419 Anxiety disorder, unspecified: Secondary | ICD-10-CM | POA: Diagnosis not present

## 2023-10-06 DIAGNOSIS — F4310 Post-traumatic stress disorder, unspecified: Secondary | ICD-10-CM | POA: Diagnosis not present

## 2023-10-06 MED ORDER — HALOPERIDOL 1 MG PO TABS
1.0000 mg | ORAL_TABLET | Freq: Two times a day (BID) | ORAL | 0 refills | Status: DC
  Filled 2023-10-06: qty 60, 30d supply, fill #0

## 2023-10-06 MED ORDER — PRAZOSIN HCL 2 MG PO CAPS
2.0000 mg | ORAL_CAPSULE | Freq: Every evening | ORAL | 0 refills | Status: DC | PRN
  Filled 2023-10-06: qty 30, 30d supply, fill #0

## 2023-10-06 MED ORDER — HYDROXYZINE HCL 10 MG PO TABS
10.0000 mg | ORAL_TABLET | Freq: Three times a day (TID) | ORAL | 0 refills | Status: DC | PRN
  Filled 2023-10-06: qty 60, 20d supply, fill #0

## 2023-10-06 MED ORDER — QUETIAPINE FUMARATE ER 300 MG PO TB24
300.0000 mg | ORAL_TABLET | Freq: Every day | ORAL | 2 refills | Status: DC
  Filled 2023-10-06: qty 30, 30d supply, fill #0
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
## 2023-10-06 NOTE — Telephone Encounter (Signed)
 Opened in error

## 2023-10-07 ENCOUNTER — Other Ambulatory Visit: Payer: Self-pay

## 2023-10-13 ENCOUNTER — Telehealth (HOSPITAL_COMMUNITY): Admitting: Psychiatry

## 2023-10-14 DIAGNOSIS — F330 Major depressive disorder, recurrent, mild: Secondary | ICD-10-CM | POA: Diagnosis not present

## 2023-10-14 DIAGNOSIS — F4310 Post-traumatic stress disorder, unspecified: Secondary | ICD-10-CM | POA: Diagnosis not present

## 2023-10-14 DIAGNOSIS — F250 Schizoaffective disorder, bipolar type: Secondary | ICD-10-CM | POA: Diagnosis not present

## 2023-10-23 ENCOUNTER — Encounter (HOSPITAL_COMMUNITY): Payer: Self-pay | Admitting: Psychiatry

## 2023-10-23 ENCOUNTER — Other Ambulatory Visit: Payer: Self-pay

## 2023-10-23 ENCOUNTER — Telehealth (HOSPITAL_BASED_OUTPATIENT_CLINIC_OR_DEPARTMENT_OTHER): Admitting: Psychiatry

## 2023-10-23 VITALS — Wt 156.0 lb

## 2023-10-23 DIAGNOSIS — F4310 Post-traumatic stress disorder, unspecified: Secondary | ICD-10-CM

## 2023-10-23 DIAGNOSIS — F5105 Insomnia due to other mental disorder: Secondary | ICD-10-CM

## 2023-10-23 DIAGNOSIS — G8929 Other chronic pain: Secondary | ICD-10-CM | POA: Diagnosis not present

## 2023-10-23 DIAGNOSIS — F99 Mental disorder, not otherwise specified: Secondary | ICD-10-CM

## 2023-10-23 DIAGNOSIS — F419 Anxiety disorder, unspecified: Secondary | ICD-10-CM | POA: Diagnosis not present

## 2023-10-23 DIAGNOSIS — F250 Schizoaffective disorder, bipolar type: Secondary | ICD-10-CM | POA: Diagnosis not present

## 2023-10-23 DIAGNOSIS — M170 Bilateral primary osteoarthritis of knee: Secondary | ICD-10-CM | POA: Diagnosis not present

## 2023-10-23 MED ORDER — THIOTHIXENE 1 MG PO CAPS
1.0000 mg | ORAL_CAPSULE | Freq: Two times a day (BID) | ORAL | 0 refills | Status: DC
  Filled 2023-10-23: qty 60, 30d supply, fill #0

## 2023-10-23 MED ORDER — PRAZOSIN HCL 2 MG PO CAPS
2.0000 mg | ORAL_CAPSULE | Freq: Every evening | ORAL | 0 refills | Status: DC | PRN
  Filled 2023-10-23: qty 30, 30d supply, fill #0

## 2023-10-23 MED ORDER — HYDROXYZINE HCL 25 MG PO TABS
25.0000 mg | ORAL_TABLET | Freq: Two times a day (BID) | ORAL | 0 refills | Status: DC | PRN
  Filled 2023-10-23: qty 60, 30d supply, fill #0

## 2023-10-23 MED ORDER — QUETIAPINE FUMARATE ER 300 MG PO TB24
300.0000 mg | ORAL_TABLET | Freq: Every day | ORAL | 0 refills | Status: DC
Start: 2023-10-23 — End: 2023-11-20
  Filled 2023-10-23 – 2023-11-03 (×2): qty 30, 30d supply, fill #0
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
## 2023-10-26 ENCOUNTER — Other Ambulatory Visit: Payer: Self-pay

## 2023-11-03 ENCOUNTER — Other Ambulatory Visit (HOSPITAL_COMMUNITY): Payer: Self-pay | Admitting: Internal Medicine

## 2023-11-03 ENCOUNTER — Other Ambulatory Visit: Payer: Self-pay

## 2023-11-03 DIAGNOSIS — F250 Schizoaffective disorder, bipolar type: Secondary | ICD-10-CM

## 2023-11-03 DIAGNOSIS — F99 Mental disorder, not otherwise specified: Secondary | ICD-10-CM

## 2023-11-06 ENCOUNTER — Other Ambulatory Visit: Payer: Self-pay

## 2023-11-09 ENCOUNTER — Encounter: Payer: Self-pay | Admitting: Radiology

## 2023-11-19 ENCOUNTER — Encounter: Payer: Medicare PPO | Admitting: Obstetrics and Gynecology

## 2023-11-20 ENCOUNTER — Other Ambulatory Visit: Payer: Self-pay

## 2023-11-20 ENCOUNTER — Encounter (HOSPITAL_COMMUNITY): Payer: Self-pay | Admitting: Psychiatry

## 2023-11-20 ENCOUNTER — Telehealth (HOSPITAL_BASED_OUTPATIENT_CLINIC_OR_DEPARTMENT_OTHER): Admitting: Psychiatry

## 2023-11-20 VITALS — Wt 156.0 lb

## 2023-11-20 DIAGNOSIS — F419 Anxiety disorder, unspecified: Secondary | ICD-10-CM

## 2023-11-20 DIAGNOSIS — F99 Mental disorder, not otherwise specified: Secondary | ICD-10-CM

## 2023-11-20 DIAGNOSIS — F4310 Post-traumatic stress disorder, unspecified: Secondary | ICD-10-CM

## 2023-11-20 DIAGNOSIS — F5105 Insomnia due to other mental disorder: Secondary | ICD-10-CM

## 2023-11-20 DIAGNOSIS — F250 Schizoaffective disorder, bipolar type: Secondary | ICD-10-CM | POA: Diagnosis not present

## 2023-11-20 MED ORDER — PRAZOSIN HCL 2 MG PO CAPS
2.0000 mg | ORAL_CAPSULE | Freq: Every evening | ORAL | 2 refills | Status: AC | PRN
Start: 2023-11-20 — End: ?
  Filled 2023-11-20: qty 30, 30d supply, fill #0

## 2023-11-20 MED ORDER — HYDROXYZINE HCL 25 MG PO TABS
25.0000 mg | ORAL_TABLET | Freq: Two times a day (BID) | ORAL | 2 refills | Status: AC | PRN
  Filled 2023-11-20: qty 60, 30d supply, fill #0
  Filled 2024-01-11: qty 60, 30d supply, fill #1

## 2023-11-20 MED ORDER — QUETIAPINE FUMARATE ER 300 MG PO TB24
300.0000 mg | ORAL_TABLET | Freq: Every day | ORAL | 2 refills | Status: AC
  Filled 2023-11-20 – 2023-12-23 (×3): qty 30, 30d supply, fill #0
  Filled 2024-01-26: qty 30, 30d supply, fill #1
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
## 2023-11-23 ENCOUNTER — Encounter: Admitting: Obstetrics and Gynecology

## 2023-11-24 DIAGNOSIS — E785 Hyperlipidemia, unspecified: Secondary | ICD-10-CM | POA: Diagnosis not present

## 2023-11-24 DIAGNOSIS — I129 Hypertensive chronic kidney disease with stage 1 through stage 4 chronic kidney disease, or unspecified chronic kidney disease: Secondary | ICD-10-CM | POA: Diagnosis not present

## 2023-11-24 DIAGNOSIS — F259 Schizoaffective disorder, unspecified: Secondary | ICD-10-CM | POA: Diagnosis not present

## 2023-11-24 DIAGNOSIS — F419 Anxiety disorder, unspecified: Secondary | ICD-10-CM | POA: Diagnosis not present

## 2023-11-24 DIAGNOSIS — K219 Gastro-esophageal reflux disease without esophagitis: Secondary | ICD-10-CM | POA: Diagnosis not present

## 2023-11-24 DIAGNOSIS — G959 Disease of spinal cord, unspecified: Secondary | ICD-10-CM | POA: Diagnosis not present

## 2023-11-24 DIAGNOSIS — Z7982 Long term (current) use of aspirin: Secondary | ICD-10-CM | POA: Diagnosis not present

## 2023-11-24 DIAGNOSIS — L309 Dermatitis, unspecified: Secondary | ICD-10-CM | POA: Diagnosis not present

## 2023-11-24 DIAGNOSIS — F319 Bipolar disorder, unspecified: Secondary | ICD-10-CM | POA: Diagnosis not present

## 2023-12-09 ENCOUNTER — Other Ambulatory Visit: Payer: Self-pay

## 2023-12-10 ENCOUNTER — Other Ambulatory Visit: Payer: Self-pay

## 2023-12-10 ENCOUNTER — Other Ambulatory Visit (HOSPITAL_COMMUNITY): Payer: Self-pay | Admitting: Psychiatry

## 2023-12-10 ENCOUNTER — Telehealth (HOSPITAL_COMMUNITY): Payer: Self-pay | Admitting: *Deleted

## 2023-12-10 ENCOUNTER — Other Ambulatory Visit (HOSPITAL_COMMUNITY): Payer: Self-pay

## 2023-12-10 DIAGNOSIS — F250 Schizoaffective disorder, bipolar type: Secondary | ICD-10-CM

## 2023-12-10 DIAGNOSIS — F99 Mental disorder, not otherwise specified: Secondary | ICD-10-CM

## 2023-12-10 NOTE — Telephone Encounter (Signed)
 Pt requests refill of the Seroquel  50 mg BID. Pt says this was discussed during her last visit and medication mistakenly marked as Not Taking in her visit on 10/06/23. Please review.   Last visit: 11/20/23 Next visit: 02/22/24

## 2023-12-11 ENCOUNTER — Other Ambulatory Visit: Payer: Self-pay

## 2023-12-11 ENCOUNTER — Other Ambulatory Visit (HOSPITAL_COMMUNITY): Payer: Self-pay | Admitting: *Deleted

## 2023-12-11 MED ORDER — QUETIAPINE FUMARATE 50 MG PO TABS
50.0000 mg | ORAL_TABLET | Freq: Two times a day (BID) | ORAL | 0 refills | Status: AC
  Filled 2023-12-11 – 2023-12-23 (×2): qty 60, 30d supply, fill #0

## 2023-12-11 MED ORDER — QUETIAPINE FUMARATE 50 MG PO TABS
50.0000 mg | ORAL_TABLET | Freq: Two times a day (BID) | ORAL | 1 refills | Status: AC
  Filled 2023-12-11 – 2024-01-26 (×2): qty 60, 30d supply, fill #0

## 2023-12-11 NOTE — Telephone Encounter (Signed)
 Done

## 2023-12-11 NOTE — Telephone Encounter (Signed)
 Send Seroquel  50 mg bid as patient taking it.

## 2023-12-14 ENCOUNTER — Encounter: Admitting: Obstetrics and Gynecology

## 2023-12-21 ENCOUNTER — Other Ambulatory Visit: Payer: Self-pay

## 2023-12-22 ENCOUNTER — Other Ambulatory Visit: Payer: Self-pay

## 2023-12-23 ENCOUNTER — Other Ambulatory Visit: Payer: Self-pay

## 2023-12-28 ENCOUNTER — Encounter: Admitting: Obstetrics and Gynecology

## 2024-01-01 ENCOUNTER — Ambulatory Visit: Admitting: "Endocrinology

## 2024-01-05 ENCOUNTER — Ambulatory Visit: Attending: Internal Medicine

## 2024-01-05 ENCOUNTER — Telehealth: Payer: Self-pay

## 2024-01-05 VITALS — Wt 150.0 lb

## 2024-01-05 DIAGNOSIS — Z0000 Encounter for general adult medical examination without abnormal findings: Secondary | ICD-10-CM

## 2024-01-05 NOTE — Telephone Encounter (Addendum)
 Patient is requesting refills on the following medications: Atorvastatin  and Tizanidine .  Patient uses [facility].  Patient scheduled for office visit on 02/05/2024.  Iliani Vejar N. Tomie, LPN [facility] Annual Wellness Team Direct Dial: [phone]

## 2024-01-05 NOTE — Patient Instructions (Signed)
 Sarah Evans,  Thank you for taking the time for your Medicare Wellness Visit. I appreciate your continued commitment to your health goals. Please review the care plan we discussed, and feel free to reach out if I can assist you further.  Please note that Annual Wellness Visits do not include a physical exam. Some assessments may be limited, especially if the visit was conducted virtually. If needed, we may recommend an in-person follow-up with your provider.  Ongoing Care Seeing your primary care provider every 3 to 6 months helps us  monitor your health and provide consistent, personalized care.   Referrals If a referral was made during today's visit and you haven't received any updates within two weeks, please contact the referred provider directly to check on the status.  Recommended Screenings:  Health Maintenance  Topic Date Due   HPV Vaccine (1 - Risk 3-dose SCDM series) Never done   Flu Shot  08/07/2023   Medicare Annual Wellness Visit  08/15/2023   COVID-19 Vaccine (6 - 2025-26 season) 09/07/2023   Breast Cancer Screening  01/05/2025   Pap with HPV screening  11/18/2027   DTaP/Tdap/Td vaccine (8 - Td or Tdap) 01/08/2028   HIV Screening  Completed   Pneumococcal Vaccine  Aged Out   Meningitis B Vaccine  Aged Out   Hepatitis B Vaccine  Discontinued   Hepatitis C Screening  Discontinued       01/05/2024    3:41 PM  Advanced Directives  Does Patient Have a Medical Advance Directive? No  Would patient like information on creating a medical advance directive? No - Patient declined    Vision: Annual vision screenings are recommended for early detection of glaucoma, cataracts, and diabetic retinopathy. These exams can also reveal signs of chronic conditions such as diabetes and high blood pressure.  Dental: Annual dental screenings help detect early signs of oral cancer, gum disease, and other conditions linked to overall health, including heart disease and diabetes.  Please see  the attached documents for additional preventive care recommendations.

## 2024-01-05 NOTE — Progress Notes (Addendum)
 "  Chief Complaint  Patient presents with   Medicare Wellness    Subsequent     Subjective:   Sarah Evans is a 43 y.o. female who presents for a Medicare Annual Wellness Visit.  Visit info / Clinical Intake: Medicare Wellness Visit Type:: Subsequent Annual Wellness Visit Persons participating in visit and providing information:: patient Medicare Wellness Visit Mode:: Telephone If telephone:: video declined Since this visit was completed virtually, some vitals may be partially provided or unavailable. Missing vitals are due to the limitations of the virtual format.: Documented vitals are patient reported If Telephone or Video please confirm:: I connected with patient using audio/video enable telemedicine. I verified patient identity with two identifiers, discussed telehealth limitations, and patient agreed to proceed. Patient Location:: HOME Provider Location:: HOME OFFICE Interpreter Needed?: No Pre-visit prep was completed: yes AWV questionnaire completed by patient prior to visit?: no Living arrangements:: (!) lives alone Patient's Overall Health Status Rating: excellent Typical amount of pain: none Does pain affect daily life?: no Are you currently prescribed opioids?: no  Dietary Habits and Nutritional Risks How many meals a day?: 3 Eats fruit and vegetables daily?: yes Most meals are obtained by: preparing own meals In the last 2 weeks, have you had any of the following?: none Diabetic:: no  Functional Status Activities of Daily Living (to include ambulation/medication): Independent Ambulation: Independent Medication Administration: Independent Home Management (perform basic housework or laundry): Independent Manage your own finances?: yes Primary transportation is: driving Concerns about vision?: no *vision screening is required for WTM* Concerns about hearing?: no  Fall Screening Falls in the past year?: 0 Number of falls in past year: 0 Was there an injury  with Fall?: 0 Fall Risk Category Calculator: 0 Patient Fall Risk Level: Low Fall Risk  Fall Risk Patient at Risk for Falls Due to: No Fall Risks Fall risk Follow up: Falls evaluation completed; Education provided  Home and Transportation Safety: All rugs have non-skid backing?: N/A, no rugs All stairs or steps have railings?: N/A, no stairs Grab bars in the bathtub or shower?: (!) no Have non-skid surface in bathtub or shower?: (!) no Good home lighting?: yes Regular seat belt use?: yes Hospital stays in the last year:: no  Cognitive Assessment Difficulty concentrating, remembering, or making decisions? : no Will 6CIT or Mini Cog be Completed: yes What year is it?: 0 points What month is it?: 0 points About what time is it?: 0 points Count backwards from 20 to 1: 0 points Say the months of the year in reverse: 0 points Repeat the address phrase from earlier: 0 points 6 CIT Score: 0 points  Advance Directives (For Healthcare) Does Patient Have a Medical Advance Directive?: No Would patient like information on creating a medical advance directive?: No - Patient declined  Reviewed/Updated  Reviewed/Updated: Reviewed All (Medical, Surgical, Family, Medications, Allergies, Care Teams, Patient Goals)    Allergies (verified) Patient has no known allergies.   Current Medications (verified) Outpatient Encounter Medications as of 01/05/2024  Medication Sig   acetaminophen  (TYLENOL ) 500 MG tablet Take 1,000 mg by mouth every 6 (six) hours as needed for moderate pain.   atorvastatin  (LIPITOR) 10 MG tablet Take 1 tablet (10 mg total) by mouth daily.   hydrOXYzine  (ATARAX ) 25 MG tablet Take 1 tablet (25 mg total) by mouth 2 (two) times daily as needed.   ketoconazole  (NIZORAL ) 2 % shampoo Apply topically 2 (two) times a week. APPLY TOPICALLY 2 TIMES A WEEK   minoxidil (LONITEN)  2.5 MG tablet Take 5 mg by mouth daily.   pantoprazole  (PROTONIX ) 20 MG tablet Take 1 tablet (20 mg total)  by mouth daily.   QUEtiapine  (SEROQUEL  XR) 300 MG 24 hr tablet Take 1 tablet (300 mg total) by mouth at bedtime.   QUEtiapine  (SEROQUEL ) 50 MG tablet Take 1 tablet (50 mg total) by mouth 2 (two) times daily.   Semaglutide , 2 MG/DOSE, 8 MG/3ML SOPN Inject 2 mg as directed once a week.   tiZANidine  (ZANAFLEX ) 4 MG tablet TAKE 1 TABLET(4 MG) BY MOUTH EVERY 6 HOURS AS NEEDED FOR MUSCLE SPASMS   valACYclovir  (VALTREX ) 1000 MG tablet Take 1 tablet (1,000 mg total) by mouth daily as needed.   cephALEXin  (KEFLEX ) 500 MG capsule Take 1 capsule (500 mg total) by mouth 2 (two) times daily. (Patient not taking: Reported on 01/05/2024)   diazepam  (VALIUM ) 5 MG tablet Take one tablet by mouth with light food one hour prior to procedure. (Patient not taking: Reported on 01/05/2024)   prazosin  (MINIPRESS ) 2 MG capsule Take 1 capsule (2 mg total) by mouth at bedtime as needed. (Patient not taking: Reported on 01/05/2024)   Prenatal Vit-Fe Fumarate-FA (PRENATAL MULTIVITAMIN) TABS tablet Take 1 tablet by mouth every evening. (Patient not taking: Reported on 01/05/2024)   QUEtiapine  (SEROQUEL ) 50 MG tablet Take 1 tablet (50 mg total) by mouth 2 (two) times daily. (Patient not taking: Reported on 01/05/2024)   No facility-administered encounter medications on file as of 01/05/2024.    History: Past Medical History:  Diagnosis Date   Bipolar 1 disorder (HCC)    Gallstone    Herpes simplex virus (HSV) infection    dx 2001   Polycystic ovary disease    PTSD (post-traumatic stress disorder)    Schizophrenia (HCC)    Stroke [facility])    Past Surgical History:  Procedure Laterality Date   BUBBLE STUDY  05/25/2020   Procedure: BUBBLE STUDY;  Surgeon: Mona Vinie BROCKS, MD;  Location: [facility] ENDOSCOPY;  Service: Cardiovascular;;   NO PAST SURGERIES     TEE WITHOUT CARDIOVERSION N/A 05/25/2020   Procedure: TRANSESOPHAGEAL ECHOCARDIOGRAM (TEE);  Surgeon: Mona Vinie BROCKS, MD;  Location: [facility] ENDOSCOPY;  Service:  Cardiovascular;  Laterality: N/A;   Family History  Problem Relation Age of Onset   Bipolar disorder Mother    Diabetes Maternal Grandmother    Hypertension Neg Hx    Heart disease Neg Hx    Cancer Neg Hx    Social History   Occupational History   Not on file  Tobacco Use   Smoking status: Former    Current packs/day: 0.00    Average packs/day: 0.3 packs/day for 15.0 years (3.8 ttl pk-yrs)    Types: Cigarettes    Start date: 05/02/2005    Quit date: 05/02/2020    Years since quitting: 3.6   Smokeless tobacco: Never  Vaping Use   Vaping status: Never Used  Substance and Sexual Activity   Alcohol use: No   Drug use: No   Sexual activity: Not Currently    Birth control/protection: Abstinence   Tobacco Counseling Counseling given: Not Answered  SDOH Screenings   Food Insecurity: No Food Insecurity (01/05/2024)  Housing: Low Risk (01/05/2024)  Transportation Needs: No Transportation Needs (01/05/2024)  Utilities: Not At Risk (01/05/2024)  Alcohol Screen: Low Risk (01/05/2024)  Depression (PHQ2-9): Medium Risk (01/05/2024)  Financial Resource Strain: High Risk (01/05/2024)  Physical Activity: Inactive (01/05/2024)  Social Connections: Socially Isolated (01/05/2024)  Stress: Stress Concern Present (01/05/2024)  Tobacco Use: Medium Risk (01/05/2024)  Health Literacy: Adequate Health Literacy (01/05/2024)   See flowsheets for full screening details  Depression Screen PHQ 2 & 9 Depression Scale- Over the past 2 weeks, how often have you been bothered by any of the following problems? Little interest or pleasure in doing things: 2 Feeling down, depressed, or hopeless (PHQ Adolescent also includes...irritable): 2 PHQ-2 Total Score: 4 Trouble falling or staying asleep, or sleeping too much: 0 Feeling tired or having little energy: 1 Poor appetite or overeating (PHQ Adolescent also includes...weight loss): 0 Feeling bad about yourself - or that you are a failure or have let  yourself or your family down: 0 Trouble concentrating on things, such as reading the newspaper or watching television (PHQ Adolescent also includes...like school work): 0 Moving or speaking so slowly that other people could have noticed. Or the opposite - being so fidgety or restless that you have been moving around a lot more than usual: 0 Thoughts that you would be better off dead, or of hurting yourself in some way: 0 PHQ-9 Total Score: 5 If you checked off any problems, how difficult have these problems made it for you to do your work, take care of things at home, or get along with other people?: Not difficult at all  Depression Treatment Depression Interventions/Treatment : Medication; Counseling     Goals Addressed             This Visit's Progress    01/05/2024: To have annual STD check.               Objective:    Today's Vitals   01/05/24 1535  Weight: 150 lb (68 kg)  PainSc: 0-No pain   Body mass index is 23.49 kg/m.  Hearing/Vision screen No results found. Immunizations and Health Maintenance Health Maintenance  Topic Date Due   HPV VACCINES (1 - Risk 3-dose SCDM series) Never done   COVID-19 Vaccine (8 - Pfizer risk 2025-26 season) 04/04/2024   Medicare Annual Wellness (AWV)  01/04/2025   Mammogram  01/05/2025   Cervical Cancer Screening (HPV/Pap Cotest)  11/18/2027   DTaP/Tdap/Td (8 - Td or Tdap) 01/08/2028   Influenza Vaccine  Completed   HIV Screening  Completed   Pneumococcal Vaccine  Aged Out   Meningococcal B Vaccine  Aged Out   Hepatitis B Vaccines 19-59 Average Risk  Discontinued   Hepatitis C Screening  Discontinued        Assessment/Plan:  This is a routine wellness examination for Sarah Evans.  Patient Care Team: Vicci Barnie NOVAK, MD as PCP - General (Internal Medicine) Dallie Vera GAILS, MD as Consulting Physician (Obstetrics and Gynecology) Sheldon Standing, MD as Consulting Physician (Colon and Rectal Surgery) Lang Caprice Loving,  MD as Referring Physician (Colon and Rectal Surgery)  I have personally reviewed and noted the following in the patients chart:   Medical and social history Use of alcohol, tobacco or illicit drugs  Current medications and supplements including opioid prescriptions. Functional ability and status Nutritional status Physical activity Advanced directives List of other physicians Hospitalizations, surgeries, and ER visits in previous 12 months Vitals Screenings to include cognitive, depression, and falls Referrals and appointments  No orders of the defined types were placed in this encounter.  In addition, I have reviewed and discussed with patient certain preventive protocols, quality metrics, and best practice recommendations. A written personalized care plan for preventive services as well as general preventive health recommendations were provided to patient.   Sarah  LOISE Fuller, LPN   [id number]   Return in about 1 year (around 01/04/2025) for Medicare wellness.  After Visit Summary: (MyChart) Due to this being a telephonic visit, the after visit summary with patients personalized plan was offered to patient via MyChart   Nurse Notes:  Appointment(s) made: (02/05/2024 at 2:10 pm) Separate telephone note sent for (Refill Request) HM Addressed: Vaccines Due: HPV Vaccine NC[facility] was verified and chart updated.  "

## 2024-01-06 ENCOUNTER — Other Ambulatory Visit: Payer: Self-pay

## 2024-01-06 ENCOUNTER — Other Ambulatory Visit: Payer: Self-pay | Admitting: Pharmacist

## 2024-01-06 MED ORDER — ATORVASTATIN CALCIUM 10 MG PO TABS
10.0000 mg | ORAL_TABLET | Freq: Every day | ORAL | 0 refills | Status: AC
  Filled 2024-01-06 – 2024-01-26 (×4): qty 90, 90d supply, fill #0

## 2024-01-06 MED ORDER — TIZANIDINE HCL 4 MG PO TABS
4.0000 mg | ORAL_TABLET | Freq: Four times a day (QID) | ORAL | 1 refills | Status: AC | PRN
  Filled 2024-01-06: qty 30, 8d supply, fill #0

## 2024-01-06 NOTE — Telephone Encounter (Signed)
Rxns sent.  

## 2024-01-11 ENCOUNTER — Other Ambulatory Visit: Payer: Self-pay

## 2024-01-22 ENCOUNTER — Other Ambulatory Visit: Payer: Self-pay

## 2024-01-26 ENCOUNTER — Other Ambulatory Visit: Payer: Self-pay

## 2024-01-26 ENCOUNTER — Other Ambulatory Visit: Payer: Self-pay | Admitting: Internal Medicine

## 2024-01-26 MED ORDER — OZEMPIC (2 MG/DOSE) 8 MG/3ML ~~LOC~~ SOPN
2.0000 mg | PEN_INJECTOR | SUBCUTANEOUS | 0 refills | Status: AC
  Filled 2024-01-26: qty 3, 28d supply, fill #0

## 2024-01-29 ENCOUNTER — Ambulatory Visit: Admitting: Internal Medicine

## 2024-01-29 ENCOUNTER — Other Ambulatory Visit: Payer: Self-pay

## 2024-01-30 ENCOUNTER — Other Ambulatory Visit: Payer: Self-pay | Admitting: Internal Medicine

## 2024-01-30 DIAGNOSIS — A6004 Herpesviral vulvovaginitis: Secondary | ICD-10-CM

## 2024-02-01 ENCOUNTER — Ambulatory Visit: Admitting: "Endocrinology

## 2024-02-05 ENCOUNTER — Encounter: Payer: Self-pay | Admitting: Internal Medicine

## 2024-02-05 ENCOUNTER — Ambulatory Visit: Payer: Self-pay | Admitting: Internal Medicine

## 2024-02-05 ENCOUNTER — Ambulatory Visit: Attending: Internal Medicine | Admitting: Internal Medicine

## 2024-02-05 ENCOUNTER — Other Ambulatory Visit (HOSPITAL_COMMUNITY)
Admission: RE | Admit: 2024-02-05 | Discharge: 2024-02-05 | Disposition: A | Source: Ambulatory Visit | Attending: Internal Medicine | Admitting: Internal Medicine

## 2024-02-05 VITALS — BP 100/70 | HR 110 | Temp 98.2°F | Ht 67.0 in | Wt 144.2 lb

## 2024-02-05 DIAGNOSIS — Z113 Encounter for screening for infections with a predominantly sexual mode of transmission: Secondary | ICD-10-CM | POA: Insufficient documentation

## 2024-02-05 NOTE — Progress Notes (Signed)
 "   Patient ID: Sarah Evans, female    DOB: October 02, 1980  MRN: [id number]  CC: STD Screening   Subjective: Sarah Evans is a 44 y.o. female who presents for chronic ds management. Her chronic medical issues include:  Patient with history of prediabetes on Ozempic , HL, PCOS, GERD, obesity, chronic thoracic spine/neck pain BL hearing loss, OSA, schizoaffective bipolar type/PTSD/MDD/GAD, OA knees   Discussed the use of AI scribe software for clinical note transcription with the patient, who gave verbal consent to proceed.  History of Present Illness Sarah Evans is a 44 year old female who presents for screening for sexually transmitted infections and HPV vaccination.  She has no current symptoms such as abnormal vaginal discharge or vaginal itching. No abnormal vaginal discharge or vaginal itching noted in the review of systems.  She has completed a self-swab for chlamydia, gonorrhea, and trichomonas testing.  She is requesting HPV vaccine. She is uncertain about her previous HPV vaccination status, and the [place]  immunization database does not show a record of her receiving the HPV vaccine.    Patient Active Problem List   Diagnosis Date Noted   Well woman exam with routine gynecological exam 11/18/2022   Vision changes 08/16/2022   Bilateral hearing loss 08/16/2022   Chronic pain of both knees 07/24/2021   Sleep apnea 06/20/2021   Cholelithiasis 06/20/2021   History of PCOS 05/30/2021   Hirsutism 05/30/2021   Chronic neck pain 11/01/2020   Chronic thoracic spine pain 10/25/2020   GERD without esophagitis 05/27/2020   Numbness and tingling 05/24/2020   History of stroke 05/23/2020   BMI 29.0-29.9,adult 05/21/2020   Herpes simplex vulvovaginitis 05/21/2020   Prediabetes 04/12/2020   Intrinsic eczema 04/12/2020   Mixed hyperlipidemia 04/12/2020   Muscle spasticity 04/12/2020   Moderate episode of recurrent major depressive disorder (HCC) 06/09/2019   Generalized  anxiety disorder 06/09/2019   Personal history of nonsuicidal self-harm 04/28/2018   PTSD (post-traumatic stress disorder) 04/30/2015   Schizoaffective disorder, bipolar type (HCC) 04/27/2015     Medications Ordered Prior to Encounter[1]  Allergies[2]  Social History   Socioeconomic History   Marital status: Single    Spouse name: Not on file   Number of children: Not on file   Years of education: Not on file   Highest education level: Associate degree: academic program  Occupational History   Not on file  Tobacco Use   Smoking status: Former    Current packs/day: 0.00    Average packs/day: 0.3 packs/day for 15.0 years (3.8 ttl pk-yrs)    Types: Cigarettes    Start date: 05/02/2005    Quit date: 05/02/2020    Years since quitting: 3.7   Smokeless tobacco: Never  Vaping Use   Vaping status: Never Used  Substance and Sexual Activity   Alcohol use: No   Drug use: No   Sexual activity: Not Currently    Birth control/protection: Abstinence  Other Topics Concern   Not on file  Social History Narrative   Not on file   Social Drivers of Health   Tobacco Use: Medium Risk (02/05/2024)   Patient History    Smoking Tobacco Use: Former    Smokeless Tobacco Use: Never    Passive Exposure: Not on file  Financial Resource Strain: Low Risk (01/26/2024)   Overall Financial Resource Strain (CARDIA)    Difficulty of Paying Living Expenses: Not hard at all  Recent Concern: Financial Resource Strain - High Risk (01/05/2024)   Overall Financial  Resource Strain (CARDIA)    Difficulty of Paying Living Expenses: Very hard  Food Insecurity: No Food Insecurity (01/26/2024)   Epic    Worried About Programme Researcher, Broadcasting/film/video in the Last Year: Never true    Ran Out of Food in the Last Year: Never true  Transportation Needs: No Transportation Needs (01/26/2024)   Epic    Lack of Transportation (Medical): No    Lack of Transportation (Non-Medical): No  Physical Activity: Inactive (01/26/2024)    Exercise Vital Sign    Days of Exercise per Week: 0 days    Minutes of Exercise per Session: Not on file  Stress: No Stress Concern Present (01/26/2024)   [employer] of Occupational Health - Occupational Stress Questionnaire    Feeling of Stress: Not at all  Recent Concern: Stress - Stress Concern Present (01/05/2024)   [employer] of Occupational Health - Occupational Stress Questionnaire    Feeling of Stress: Rather much  Social Connections: Unknown (01/26/2024)   Social Connection and Isolation Panel    Frequency of Communication with Friends and Family: More than three times a week    Frequency of Social Gatherings with Friends and Family: Patient declined    Attends Religious Services: Never    Database Administrator or Organizations: No    Attends Engineer, Structural: Not on file    Marital Status: Patient declined  Recent Concern: Social Connections - Socially Isolated (01/05/2024)   Social Connection and Isolation Panel    Frequency of Communication with Friends and Family: Once a week    Frequency of Social Gatherings with Friends and Family: Once a week    Attends Religious Services: Never    Database Administrator or Organizations: No    Attends Banker Meetings: Never    Marital Status: Never married  Intimate Partner Violence: Not At Risk (01/05/2024)   Epic    Fear of Current or Ex-Partner: No    Emotionally Abused: No    Physically Abused: No    Sexually Abused: No  Depression (PHQ2-9): Medium Risk (01/05/2024)   Depression (PHQ2-9)    PHQ-2 Score: 5  Alcohol Screen: Low Risk (01/05/2024)   Alcohol Screen    Last Alcohol Screening Score (AUDIT): 0  Housing: Low Risk (01/26/2024)   Epic    Unable to Pay for Housing in the Last Year: No    Number of Times Moved in the Last Year: 0    Homeless in the Last Year: No  Utilities: Not At Risk (01/05/2024)   Epic    Threatened with loss of utilities: No  Health Literacy: Adequate  Health Literacy (01/05/2024)   B1300 Health Literacy    Frequency of need for help with medical instructions: Never    Family History  Problem Relation Age of Onset   Bipolar disorder Mother    Diabetes Maternal Grandmother    Hypertension Neg Hx    Heart disease Neg Hx    Cancer Neg Hx     Past Surgical History:  Procedure Laterality Date   BUBBLE STUDY  05/25/2020   Procedure: BUBBLE STUDY;  Surgeon: Mona Vinie BROCKS, MD;  Location: [facility] ENDOSCOPY;  Service: Cardiovascular;;   NO PAST SURGERIES     TEE WITHOUT CARDIOVERSION N/A 05/25/2020   Procedure: TRANSESOPHAGEAL ECHOCARDIOGRAM (TEE);  Surgeon: Mona Vinie BROCKS, MD;  Location: [facility] ENDOSCOPY;  Service: Cardiovascular;  Laterality: N/A;    ROS: Review of Systems Negative except as stated above  PHYSICAL  EXAM: BP 100/70   Pulse (!) 110   Temp 98.2 F (36.8 C) (Oral)   Ht 5' 7 (1.702 m)   Wt 144 lb 3.2 oz (65.4 kg)   SpO2 100%   BMI 22.58 kg/m    Physical Exam  General appearance - alert, well appearing, and in no distress Mental status - normal mood, behavior, speech, dress, motor activity, and thought processes      Latest Ref Rng & Units 09/17/2023   11:23 PM 09/15/2023    4:31 PM 03/07/2023    9:58 PM  CMP  Glucose 70 - 99 mg/dL 93  70  79   BUN 6 - 20 mg/dL 10  10  12    Creatinine 0.44 - 1.00 mg/dL 9.02  9.01  9.21   Sodium 135 - 145 mmol/L 139  142  139   Potassium 3.5 - 5.1 mmol/L 3.7  4.1  3.3   Chloride 98 - 111 mmol/L 104  106  105   CO2 22 - 32 mmol/L 23  23  27    Calcium  8.9 - 10.3 mg/dL 9.0  9.1  9.0    Lipid Panel     Component Value Date/Time   CHOL 188 02/09/2023 1620   TRIG 61 02/09/2023 1620   HDL 71 02/09/2023 1620   CHOLHDL 2.6 02/09/2023 1620   CHOLHDL 2.8 05/24/2020 0332   VLDL 28 05/24/2020 0332   LDLCALC 106 (H) 02/09/2023 1620    CBC    Component Value Date/Time   WBC 4.0 09/17/2023 2323   RBC 4.29 09/17/2023 2323   HGB 12.2 09/17/2023 2323   HGB 12.3 02/09/2023 1621    HCT 38.6 09/17/2023 2323   HCT 39.6 02/09/2023 1621   PLT 293 09/17/2023 2323   PLT 379 02/09/2023 1621   MCV 90.0 09/17/2023 2323   MCV 92 02/09/2023 1621   MCH 28.4 09/17/2023 2323   MCHC 31.6 09/17/2023 2323   RDW 12.7 09/17/2023 2323   RDW 13.1 02/09/2023 1621   LYMPHSABS 1.8 02/09/2023 1621   MONOABS 0.5 05/22/2021 1555   EOSABS 0.1 02/09/2023 1621   BASOSABS 0.1 02/09/2023 1621    ASSESSMENT AND PLAN: 1. Screening for STD (sexually transmitted disease) (Primary) Screening for STIs including chlamydia, gonorrhea, trichomonas, HIV, syphilis, and hepatitis. HPV vaccination status uncertain. Discussed HPV vaccination usually given in preteen yrs up to age 82. Can do catch up from age 57-45. Advised to verify insurance coverage for HPV vaccination given that she is over 42 yrs old. - Cervicovaginal ancillary only - HIV antibody (with reflex) - RPR w/reflex to TrepSure - Hepatitis C Antibody    Patient was given the opportunity to ask questions.  Patient verbalized understanding of the plan and was able to repeat key elements of the plan.   This documentation was completed using Paediatric nurse.  Any transcriptional errors are unintentional.  Orders Placed This Encounter  Procedures   HIV antibody (with reflex)   RPR w/reflex to TrepSure   Hepatitis C Antibody     Requested Prescriptions    No prescriptions requested or ordered in this encounter    No follow-ups on file.  Barnie Louder, MD, FACP     [1]  Current Outpatient Medications on File Prior to Visit  Medication Sig Dispense Refill   atorvastatin  (LIPITOR) 10 MG tablet Take 1 tablet (10 mg total) by mouth daily. 90 tablet 0   hydrOXYzine  (ATARAX ) 25 MG tablet Take 1 tablet (25 mg total) by  mouth 2 (two) times daily as needed. 60 tablet 2   ketoconazole  (NIZORAL ) 2 % shampoo Apply topically 2 (two) times a week. APPLY TOPICALLY 2 TIMES A WEEK 120 mL 3   minoxidil (LONITEN) 2.5 MG  tablet Take 5 mg by mouth daily.     QUEtiapine  (SEROQUEL  XR) 300 MG 24 hr tablet Take 1 tablet (300 mg total) by mouth at bedtime. 30 tablet 2   QUEtiapine  (SEROQUEL ) 50 MG tablet Take 1 tablet (50 mg total) by mouth 2 (two) times daily. 60 tablet 0   QUEtiapine  (SEROQUEL ) 50 MG tablet Take 1 tablet (50 mg total) by mouth 2 (two) times daily. 60 tablet 1   Semaglutide , 2 MG/DOSE, (OZEMPIC , 2 MG/DOSE,) 8 MG/3ML SOPN Inject 2 mg as directed once a week. 3 mL 0   tiZANidine  (ZANAFLEX ) 4 MG tablet Take 1 tablet (4 mg total) by mouth every 6 (six) hours as needed for muscle spasms. 30 tablet 1   valACYclovir  (VALTREX ) 1000 MG tablet TAKE 1 TABLET EVERY DAY AS NEEDED 30 tablet 0   acetaminophen  (TYLENOL ) 500 MG tablet Take 1,000 mg by mouth every 6 (six) hours as needed for moderate pain.     cephALEXin  (KEFLEX ) 500 MG capsule Take 1 capsule (500 mg total) by mouth 2 (two) times daily. (Patient not taking: Reported on 01/05/2024) 14 capsule 0   diazepam  (VALIUM ) 5 MG tablet Take one tablet by mouth with light food one hour prior to procedure. (Patient not taking: Reported on 01/05/2024) 1 tablet 0   pantoprazole  (PROTONIX ) 20 MG tablet Take 1 tablet (20 mg total) by mouth daily. 30 tablet 0   prazosin  (MINIPRESS ) 2 MG capsule Take 1 capsule (2 mg total) by mouth at bedtime as needed. (Patient not taking: Reported on 01/05/2024) 30 capsule 2   Prenatal Vit-Fe Fumarate-FA (PRENATAL MULTIVITAMIN) TABS tablet Take 1 tablet by mouth every evening. (Patient not taking: Reported on 01/05/2024)     No current facility-administered medications on file prior to visit.  [2] No Known Allergies  "

## 2024-02-05 NOTE — Patient Instructions (Signed)
" °  VISIT SUMMARY: LARINDA HERTER, a 44 year old female, visited for screening for sexually transmitted infections (STIs) and to discuss HPV vaccination. She has no current symptoms of STIs. A self-swab was completed for chlamydia, gonorrhea, and trichomonas testing. Her HPV vaccination status is uncertain, and there is no record of her receiving the vaccine in the [facility]  immunization database.  YOUR PLAN: -SCREENING FOR STIS: Screening for sexually transmitted infections (STIs) is important to detect any infections early and prevent complications. Blood tests have been ordered for HIV, syphilis, and hepatitis. A self-swab was completed for chlamydia, gonorrhea, and trichomonas testing.  -HPV VACCINATION: Human papillomavirus (HPV) vaccination helps protect against certain types of HPV that can lead to cancer. Since your vaccination status is uncertain, it is important to verify insurance coverage for the HPV vaccine. If coverage is confirmed, the vaccine will be administered.  INSTRUCTIONS: Please follow up with your insurance provider to verify coverage for the HPV vaccination. Once coverage is confirmed, schedule an appointment to receive the vaccine. Additionally, await the results of your STI screenings and follow any further instructions provided based on those results.    Contains text generated by [employer]bridge.   "

## 2024-02-06 ENCOUNTER — Ambulatory Visit: Payer: Self-pay | Admitting: Internal Medicine

## 2024-02-10 LAB — CERVICOVAGINAL ANCILLARY ONLY
Bacterial Vaginitis (gardnerella): NEGATIVE
Candida Glabrata: POSITIVE — AB
Candida Vaginitis: NEGATIVE
Chlamydia: NEGATIVE
Comment: NEGATIVE
Comment: NEGATIVE
Comment: NEGATIVE
Comment: NEGATIVE
Comment: NEGATIVE
Comment: NORMAL
Neisseria Gonorrhea: NEGATIVE
Trichomonas: NEGATIVE

## 2024-02-10 LAB — HEPATITIS C ANTIBODY: Hep C Virus Ab: NONREACTIVE

## 2024-02-10 LAB — HIV ANTIBODY (ROUTINE TESTING W REFLEX): HIV Screen 4th Generation wRfx: NONREACTIVE

## 2024-02-10 LAB — TREPONEMAL ANTIBODIES, TPPA: Treponemal Antibodies, TPPA: NONREACTIVE

## 2024-02-10 LAB — RPR W/REFLEX TO TREPSURE: RPR: NONREACTIVE

## 2024-02-11 ENCOUNTER — Other Ambulatory Visit: Payer: Self-pay

## 2024-02-11 ENCOUNTER — Other Ambulatory Visit: Payer: Self-pay | Admitting: Internal Medicine

## 2024-02-11 MED ORDER — BORIC ACID VAGINAL 600 MG VA SUPP
VAGINAL | 0 refills | Status: AC
  Filled 2024-02-11: qty 14, fill #0

## 2024-02-12 ENCOUNTER — Ambulatory Visit: Payer: Self-pay

## 2024-02-12 NOTE — Telephone Encounter (Signed)
 FYI Only or Action Required?: Action required by provider: request for rx change.  Patient was last seen in primary care on 02/05/2024 by Vicci Barnie NOVAK, MD.  Called Nurse Triage reporting Medication Refill and Vaginitis.  Triage Disposition: Home Care  Patient/caregiver understands and will follow disposition?: Yes  Copied from CRM #[id number]. Topic: Clinical - Prescription Issue >> Feb 12, 2024  3:45 PM Willma R wrote: Reason for CRM: Patient was prescribed Boric Acid Vaginal 600 MG SUPP for a yeast infection. Is requesting to see if she would be able to get an antibiotic instead.  Patient can be reached at [phone] Reason for Disposition  Yeast infections (vaginal), questions about  Answer Assessment - Initial Assessment Questions Returned call to patient to discuss medication request for yeast infection. Patient prescribed rx but requesting a new one instead. Patient requesting an oral antibiotic. Advised to use otc medication until new rx. Educated on the need to refrain from vaginal intercourse during medication therapy.  1. SYMPTOMS: Do you have any symptoms of a sexually transmitted infection (STI)? If Yes, ask: What are they?     Vaginal discharge and itching  2. TYPE: What sexually transmitted infection do you have questions about?     Dx with yeats infection  4. PREVENTION: Do you have questions about preventing AIDS and other sexually transmitted infections?     Education given on preventing recurrent episodes  Protocols used: STI Questions-A-AH

## 2024-02-16 ENCOUNTER — Encounter: Admitting: Obstetrics and Gynecology

## 2024-02-17 ENCOUNTER — Ambulatory Visit: Admitting: "Endocrinology

## 2024-02-22 ENCOUNTER — Telehealth (HOSPITAL_COMMUNITY): Admitting: Psychiatry
